# Patient Record
Sex: Female | Born: 1980 | Race: White | Hispanic: No | Marital: Single | Smoking: Former smoker
Health system: Southern US, Community
[De-identification: ages and names within clinical notes are randomized; demographics above are authoritative.]

## PROBLEM LIST (undated history)

## (undated) DIAGNOSIS — G629 Polyneuropathy, unspecified: Secondary | ICD-10-CM

## (undated) DIAGNOSIS — I10 Essential (primary) hypertension: Secondary | ICD-10-CM

## (undated) DIAGNOSIS — K3184 Gastroparesis: Secondary | ICD-10-CM

---
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
## 2001-04-02 ENCOUNTER — Encounter: Admission: RE | Admit: 2001-04-02 | Discharge: 2001-07-01 | Payer: Self-pay | Admitting: Family Medicine

## 2001-05-20 ENCOUNTER — Ambulatory Visit (HOSPITAL_COMMUNITY): Admission: RE | Admit: 2001-05-20 | Discharge: 2001-05-20 | Payer: Self-pay | Admitting: Obstetrics and Gynecology

## 2001-06-11 ENCOUNTER — Ambulatory Visit (HOSPITAL_COMMUNITY): Admission: RE | Admit: 2001-06-11 | Discharge: 2001-06-11 | Payer: Self-pay | Admitting: Obstetrics and Gynecology

## 2001-07-06 ENCOUNTER — Encounter: Admission: RE | Admit: 2001-07-06 | Discharge: 2001-10-04 | Payer: Self-pay | Admitting: Family Medicine

## 2001-08-31 ENCOUNTER — Emergency Department (HOSPITAL_COMMUNITY): Admission: EM | Admit: 2001-08-31 | Discharge: 2001-08-31 | Payer: Self-pay | Admitting: Emergency Medicine

## 2001-10-08 ENCOUNTER — Encounter: Admission: RE | Admit: 2001-10-08 | Discharge: 2002-01-06 | Payer: Self-pay

## 2001-11-16 ENCOUNTER — Other Ambulatory Visit: Admission: RE | Admit: 2001-11-16 | Discharge: 2001-11-16 | Payer: Self-pay | Admitting: Obstetrics and Gynecology

## 2002-04-19 ENCOUNTER — Ambulatory Visit (HOSPITAL_COMMUNITY): Admission: RE | Admit: 2002-04-19 | Discharge: 2002-04-19 | Payer: Self-pay | Admitting: Cardiology

## 2003-01-06 ENCOUNTER — Encounter: Admission: RE | Admit: 2003-01-06 | Discharge: 2003-01-06 | Payer: Self-pay | Admitting: Family Medicine

## 2003-01-26 ENCOUNTER — Encounter: Admission: RE | Admit: 2003-01-26 | Discharge: 2003-01-26 | Payer: Self-pay | Admitting: Family Medicine

## 2003-01-27 ENCOUNTER — Encounter: Admission: RE | Admit: 2003-01-27 | Discharge: 2003-01-27 | Payer: Self-pay | Admitting: Family Medicine

## 2003-02-04 ENCOUNTER — Encounter: Admission: RE | Admit: 2003-02-04 | Discharge: 2003-02-04 | Payer: Self-pay | Admitting: Family Medicine

## 2003-10-07 ENCOUNTER — Other Ambulatory Visit: Admission: RE | Admit: 2003-10-07 | Discharge: 2003-10-07 | Payer: Self-pay | Admitting: Obstetrics and Gynecology

## 2004-10-25 ENCOUNTER — Other Ambulatory Visit: Admission: RE | Admit: 2004-10-25 | Discharge: 2004-10-25 | Payer: Self-pay | Admitting: Obstetrics and Gynecology

## 2004-12-26 ENCOUNTER — Encounter: Admission: RE | Admit: 2004-12-26 | Discharge: 2004-12-26 | Payer: Self-pay | Admitting: Internal Medicine

## 2005-11-17 ENCOUNTER — Encounter: Admission: RE | Admit: 2005-11-17 | Discharge: 2005-11-17 | Payer: Self-pay | Admitting: Internal Medicine

## 2005-12-10 ENCOUNTER — Other Ambulatory Visit: Admission: RE | Admit: 2005-12-10 | Discharge: 2005-12-10 | Payer: Self-pay | Admitting: Obstetrics and Gynecology

## 2005-12-14 ENCOUNTER — Encounter: Admission: RE | Admit: 2005-12-14 | Discharge: 2005-12-14 | Payer: Self-pay | Admitting: Internal Medicine

## 2006-05-05 ENCOUNTER — Emergency Department (HOSPITAL_COMMUNITY): Admission: EM | Admit: 2006-05-05 | Discharge: 2006-05-05 | Payer: Self-pay | Admitting: Emergency Medicine

## 2006-05-10 ENCOUNTER — Encounter: Admission: RE | Admit: 2006-05-10 | Discharge: 2006-05-10 | Payer: Self-pay | Admitting: Internal Medicine

## 2006-05-18 ENCOUNTER — Encounter: Admission: RE | Admit: 2006-05-18 | Discharge: 2006-05-18 | Payer: Self-pay | Admitting: Anesthesiology

## 2006-05-25 ENCOUNTER — Encounter: Admission: RE | Admit: 2006-05-25 | Discharge: 2006-05-25 | Payer: Self-pay | Admitting: Anesthesiology

## 2006-06-19 DIAGNOSIS — E109 Type 1 diabetes mellitus without complications: Secondary | ICD-10-CM | POA: Insufficient documentation

## 2006-06-19 DIAGNOSIS — G609 Hereditary and idiopathic neuropathy, unspecified: Secondary | ICD-10-CM | POA: Insufficient documentation

## 2006-06-19 DIAGNOSIS — R112 Nausea with vomiting, unspecified: Secondary | ICD-10-CM | POA: Insufficient documentation

## 2006-06-19 DIAGNOSIS — E1065 Type 1 diabetes mellitus with hyperglycemia: Secondary | ICD-10-CM | POA: Insufficient documentation

## 2006-06-19 DIAGNOSIS — L708 Other acne: Secondary | ICD-10-CM

## 2008-08-06 ENCOUNTER — Emergency Department (HOSPITAL_BASED_OUTPATIENT_CLINIC_OR_DEPARTMENT_OTHER): Admission: EM | Admit: 2008-08-06 | Discharge: 2008-08-06 | Payer: Self-pay | Admitting: Emergency Medicine

## 2008-09-22 ENCOUNTER — Encounter: Admission: RE | Admit: 2008-09-22 | Discharge: 2008-09-22 | Payer: Self-pay | Admitting: Unknown Physician Specialty

## 2010-05-12 ENCOUNTER — Encounter: Payer: Self-pay | Admitting: Internal Medicine

## 2010-05-14 ENCOUNTER — Encounter: Payer: Self-pay | Admitting: Unknown Physician Specialty

## 2010-08-01 LAB — URINALYSIS, ROUTINE W REFLEX MICROSCOPIC
Nitrite: NEGATIVE
Protein, ur: 30 mg/dL — AB
pH: 7 (ref 5.0–8.0)

## 2010-08-01 LAB — DIFFERENTIAL
Eosinophils Absolute: 0.1 10*3/uL (ref 0.0–0.7)
Eosinophils Relative: 2 % (ref 0–5)
Lymphocytes Relative: 31 % (ref 12–46)
Lymphs Abs: 1.8 10*3/uL (ref 0.7–4.0)
Monocytes Relative: 5 % (ref 3–12)
Neutro Abs: 3.8 10*3/uL (ref 1.7–7.7)
Neutrophils Relative %: 62 % (ref 43–77)

## 2010-08-01 LAB — URINE MICROSCOPIC-ADD ON

## 2010-08-01 LAB — GLUCOSE, CAPILLARY: Glucose-Capillary: 256 mg/dL — ABNORMAL HIGH (ref 70–99)

## 2010-08-01 LAB — CBC
HCT: 48.8 % — ABNORMAL HIGH (ref 36.0–46.0)
Hemoglobin: 16 g/dL — ABNORMAL HIGH (ref 12.0–15.0)
MCV: 90.4 fL (ref 78.0–100.0)
Platelets: 304 10*3/uL (ref 150–400)
RBC: 5.4 MIL/uL — ABNORMAL HIGH (ref 3.87–5.11)
RDW: 13.3 % (ref 11.5–15.5)

## 2010-08-01 LAB — URINE CULTURE: Colony Count: >=100000

## 2010-08-01 LAB — BASIC METABOLIC PANEL
CO2: 31 mEq/L (ref 19–32)
Chloride: 91 mEq/L — ABNORMAL LOW (ref 96–112)
GFR calc Af Amer: >60 mL/min (ref >60)
GFR calc non Af Amer: >60 mL/min (ref >60)
Glucose, Bld: 241 mg/dL — ABNORMAL HIGH (ref 70–99)

## 2010-09-07 NOTE — H&P (Signed)
[facility]  Patient:    Anna, Garcia Visit Number: [id number] MRN: [id number]          Service Type: OUT Location: DAY Attending Physician:  Oliver Pila Dictated by:   Alvino Chapel, M.D. Admit Date:  05/20/2001 Discharge Date: 05/20/2001                           History and Physical  HISTORY OF PRESENT ILLNESS:  The patient is a 30 year old G1, P0, who complains of increasing and constant pelvic pain for greater than a year. The patient reports that this pain is present daily.  However, appears to be much worse with intercourse.  This pain is present daily and it is not necessarily related to her menstrual cycle.  She reports normal bowel movements and had been on oral contraceptives for over one year with no real significant improvement in her symptoms.  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  PAST SURGICAL HISTORY:  Suction D&C.  PAST GYN HISTORY:  None.  PAST OBSTETRICAL HISTORY:  She had an elective abortion in 1999.  PAST FAMILY HISTORY:  No history of breast cancer.  PHYSICAL EXAMINATION:  VITAL SIGNS:  The patients height is 5 feet and 3 inches.  Weight is 143 pounds.  Blood pressure 118/60.  BREASTS:  Exam is normal.  ABDOMEN:  Soft and nontender.  CARDIAC EXAMINATION:  Regular rate and rhythm.  LUNGS:  Clear.  PELVIC EXAMINATION:  Showed a small anteverted uterus.  The adnexa have no significant masses.  There is some tenderness directly over the uterine fundus.  CURRENT MEDICATIONS:  Insulin, _______, Neurontin, and Zyprexa.  LABORATORY DATA:  She has had extensive workup for possible bowel etiology of this pain and has even seen a neurologist for related symptoms of possible fibromyalgia.  An ultrasound performed in April 2002 was essentially within normal limits, and the patient had multiple tests performed for STDs with no findings as well as  long-term trial of birth control pills with no improvement in her pain.  ASSESSMENT:  The patient was counseled as to the risks and benefits of proceeding with laparoscopy to rule out endometriosis or other pathology. These risks were described as bleeding, infection, and possible damage to bowel and bladder.  The patient understands these risks and strongly desires to proceed with the surgery for possible etiology of her pain.  The patient was originally scheduled for surgery on May 25, 2001.  However, when she went for preoperative testing, her glucose was very elevated at 446.  For that reason, her surgery was postponed until her diabetes could be better controlled.  She was then seen by her primary care doctor, Dr. Smith Mince and hemoglobin A1C at that time was 7.7, and her insulin was adjusted with much improvement and her preoperative glucose was 89 on recent testing.  The patient did have an anesthesia consult prior to surgery regarding her glucose management. Dictated by:   Alvino Chapel, M.D. Attending Physician:  Oliver Pila DD:  06/10/01 TD:  06/10/01 Job: [id number]

## 2010-09-07 NOTE — Procedures (Signed)
[place]  Patient:    Anna Garcia, Anna Garcia                         MRN: [id number] Proc. Date: 10/31/00 Adm. Date:  [id number] Disc. Date: [id number] Attending:  Charna Elizabeth CC:         Talmadge Coventry, M.D.   Procedure Report  DATE OF BIRTH:  [birth year]  PROCEDURE:  Esophagogastroduodenoscopy.  ENDOSCOPIST:  Anselmo Rod, M.D.  INSTRUMENT USED:  Olympus video upper panendoscope.  INDICATION FOR PROCEDURE:  Nausea, vomiting, and epigastric pain in a _____ white female.  Rule out peptic ulcer disease, esophagitis, gastritis, etc. The patient has a long-standing history of insulin-dependent diabetes.  PREPROCEDURE PREPARATION:  Informed consent was procured from the patient. The patient was fasted for eight hours prior to the procedure.  PREPROCEDURE PHYSICAL:  VITAL SIGNS:  The patient had stable vital signs.  NECK:  Supple.  CHEST:  Clear to auscultation.  S1, S2 regular.  ABDOMEN:  Soft with normal bowel sounds, epigastric tenderness on palpation with guarding.  No rebound or rigidity.  DESCRIPTION OF PROCEDURE:  The patient was placed in the left lateral decubitus position and sedated with 70 mg of Demerol and 7 mg of Versed intravenously.  Once the patient was adequately sedate and maintained on low-flow oxygen and continuous cardiac monitoring, the Olympus video panendoscope was advanced through the mouthpiece, over the tongue, into the esophagus under direct vision.  The entire esophagus appeared normal without evidence of ring, stricture, masses, lesions, esophagitis, or Barretts mucosa.  The scope was then advanced into the stomach.  There was some small amount of debris along the greater curvature.  No other abnormalities were seen.  The proximal small bowel appeared normal.  IMPRESSION:  Some debris in the stomach, otherwise normal EGD.  RECOMMENDATIONS:  Will try prokinetics empirically and follow up on  an outpatient basis. DD:  12/07/00 TD:  12/08/00 Job: [id number]

## 2010-09-07 NOTE — Consult Note (Signed)
[facility]  Patient:    [name], [name] Visit Number: [id number] MRN: [id number]          Service Type: PMG Location: TPC Attending Physician:  Anna Garcia Dictated by:   Anna Garcia, D.O. Proc. Date: 10/09/01 Admit Date:  10/08/2001   CC:         Anna Garcia, M.D.  Anna Garcia, M.D.   Consultation Report  NEW PATIENT CONSULTATION  REFERRING Anna Garcia:  Anna Garcia, M.D.  Dear Dr. Lucianne Garcia:  Thank you very much for kindly referring [facility] to [facility].  The patient was seen in our clinic today.  Please refer to the following for details regarding the history, physical examination, and treatment recommendations.  Once again, thank you for allowing [place] to participate in the care of Anna Garcia.  CHIEF COMPLAINT:  Extreme pain and weakness.  HISTORY OF PRESENT ILLNESS:  Anna Garcia is a pleasant 30 year old right-hand dominant female who was interviewed and examined in a chaperoned environment. The patient states she is a poorly controlled diabetic who was diagnosed with diabetes mellitus in 2000 when she was hospitalized for diabetic ketoacidosis.  She states that her diabetes is poorly controlled with blood sugars ranging from less than 20 to greater than 500.  She states she was diagnosed with diabetic neuropathy.  She complains of "peripheral neuropathy everywhere." Her pain is migratory and denies any one particular spot in her body which is consistently worse than the others.  She has had a gradual increase in her pain over the last two years.  She states she has been evaluated by three neurologists in the past but cannot remember who they were.  She also has had a rheumatology evaluation but cannot remember the physicians name.  She has had nerve conduction studies in the past but vaguely remembers if they were normal.  She also complains of occasional headaches and dizzy  spells, and states that she has a followup with another neurologist.  She also states that she was supposed to undergo some type of exploratory laparoscopy for "pain in my ovaries and cervix;" however, her blood sugars were too unstable to undergo the surgery.  Somewhere along the way she has tried Duragesic patches and OxyContin, neither of which she was able to tolerate secondary to nausea, vomiting and dizziness.  She complains of significant gastroparesis, which is currently being worked up.  She states she cannot barely keep anything down and has a difficult time with most medications.  The only medication she is taking for pain right now is Neurontin 900 mg a day, which she takes at bedtime.  She states that it does not help her pain much but helps her with "twitches."  She denies a history of depression, although she does have scars on her wrists bilaterally and when questioned she states that she had "issues" when she was younger.  She states she was approximately 30 years old when she cut her wrists and forearm.  When asked why she did this, she was somewhat elusive and would not respond.  I asked her if she had any siblings and she responded that she had an older brother.  I asked if they got along well at which time she began to become tearful and stated that they do not talk much. She did not discuss these issues any further.  In terms of other medications that she has tried include muscle relaxers and  anti-inflammatories, but she cannot remember the names of any.  As I list various medications, she states that she tried it including Zanaflex, Flexeril, Celebrex, Bextra, Vioxx, and stated that "nothing helps."  I reviewed the health and history form, and 14-point review of systems.  The patients pain level is a 10/10 on a subjective scale involving essentially every aspect of her body today.  She describes it as constant, dull, achy, throbbing, sharp, burning, stabbing  with associated numbness, tingling and weakness.  Symptoms are worse with walking, bending, sitting, working, and without any alleviating factors.  Function and quality of life indices have declined, sleep is poor.  She admits to a 60 pound weight loss in the past six months.  She also admits to poor bladder emptying, which has been worked up with urodynamic studies.  She complains of double vision, blurred vision, cataracts, dizziness, sinus trouble, sore tongue, chest pain, irregular heartbeat, leg pain with walking, wheezing, shortness of breath, asthma, heartburn, change in bowel habits, nausea, vomiting, headaches, and memory loss.  She denies risk to harm herself or others.  PAST MEDICAL HISTORY: 1. Diabetic neuropathy by history. 2. 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 3. Gastroparesis. 4. History of depression, although denies at this time. 5. Cataracts.  PAST SURGICAL HISTORY:  Denies.  Hospital history includes a hospitalization for ketoacidosis in 2000.  FAMILY HISTORY:  Heart disease and hypertension.  SOCIAL HISTORY:  The patient smokes one half pack of cigarettes per day or less.  She admits to occasional alcohol use.  She admits to smoking marijuana on occasion.  She is single and is not currently working since 2001.  She denies any history of abuse but again has some previous "issues."  PHYSICAL EXAMINATION:  GENERAL:  The patient is a thin female who moves somewhat slowly.  The patient has significant difficulty standing up from a seated position with legs shaking and slowly moving.  She stands upright.  VITAL SIGNS:  Blood pressure 114/60, pulse 86, respirations 18, O2 saturation is 99% on room air.  SPINE:  Examination reveals level pelvis without scoliosis.  There is normal lumbar lordosis, thoracic kyphosis and cervical lordosis.  Range of motion of the spine is limited secondary to guarding.   MUSCULOSKELETAL:  Palpatory examination  is diffusely tender to palpation over the periscapular muscles, paraspinal muscles cervical, thoracic and lumbar, upper and lower extremities diffusely.  NEUROLOGIC:  Manual muscle testing reveals significant giveway weakness with cogwheeling and shaking of the extremities in all four extremities.  Sensory exam is difficult to determine but the patient states diffusely she either has tingly sensation or numbness but is not very specific.  Muscle stretch reflexes are 2+/4 bilateral biceps, triceps, brachioradialis, pronator teres, patellar, medial hamstrings, and Achilles.  EXTREMITIES:  There is no distal atrophy noted in the upper and lower extremities.  No heat, erythema, or edema in the upper and lower extremities. Straight leg raise is negative bilaterally.  FABER is negative bilaterally. Lhermittes is negative bilaterally.  There are noted small scars at the left volar wrist as well as the left volar forearm.  IMPRESSION: 1. Diffuse soft tissue pain syndrome.  Underlying etiology uncertain.  I    suspect there is likely a significant psychological overlay with possible    conversion.  The patients history is suggestive of underlying    psychological component. 2. Depressive disorder. 3. Poorly controlled diabetes mellitus with questionable peripheral    neuropathy. 4. Gastroparesis, question whether or not  there is an eating disorder.  RECOMMENDATIONS: 1. Recommend psychiatric/psychologic evaluation. 2. Will attempt to gather records from the patients Primary Care Ticia Virgo,    Dr. Smith Mince regarding previous workup. 3. The patient is to followup with Dr. Lucianne Garcia. 4. Would consider increasing Neurontin dose if suspicious for peripheral    polyneuropathy. 5. I do not believe the patient is a candidate for any type of opioid therapy    nor would I prescribe any with the patients history of marijuana use. 6. I strongly feel that she needs to have underlying psychiatric issues     addressed.  The patient was educated on the above findings and recommendations, and understands.  There were no barriers to communication.  Again, the patient was interviewed and examined in a chaperoned environment. Dictated by:   Anna Garcia, D.O. Attending Physician:  Anna Garcia DD:  10/09/01 TD:  10/11/01 Job: [id number]

## 2010-09-07 NOTE — Op Note (Signed)
NAME:  Anna Garcia, Anna Garcia                            ACCOUNT NO.:  [id number]   MEDICAL RECORD NO.:  [id number]                   PATIENT TYPE:  OIB   LOCATION:  2899                                 FACILITY:  [facility]   PHYSICIAN:  Doylene Canning. Ladona Ridgel, M.D. [facility]           DATE OF BIRTH:  27-Sep-1980   DATE OF PROCEDURE:  DATE OF DISCHARGE:                                 OPERATIVE REPORT   Ms. Rossetti is referred by Dr. Juanito Doom for evaluation of recurrent episodes of  near-syncope and light-headedness, refractory to initial medical therapy for  head of tilt table testing.   1. The patient is a 30 year old young lady with a history of diabetes and     diabetic neuropathy.  She is followed by Dr. Lucianne Muss.  Because of     palpitations and recurrent episodes of dizziness and light-headedness     which have not initially responded to salt and an increase in fluid, the     patient is referred for head of tilt table testing.  2. PROCEDURE:  After informed consent was obtained, the patient was taken to     the diagnostic EP lab in the fasting state.  After usual preparations,     she was placed in the supine position.  Her initial blood pressure was     110/67 with a pulse of 78.  She was allowed to remain in this position     for five minutes, and her blood pressure and heart rate remained stable.     She was placed in the head up tilt position.  Initially, at the first     minute into tilting, her blood pressure was 101/63 with a pulse of 93.     The blood pressure remained in the 95-110 range systolic with heart rates     in the 90-100 range over the course of the next 30 minutes.  The patient     had minimal symptoms, she had very mild light-headedness.  After 30     minutes in this position, she was placed back in the supine position.  As     noted, her blood pressure remained stable as did her heart rate.  She was     placed back in the supine position and isoproterenol was subsequently     infused.   Once the heart rate had increased, she was placed back in the     head up tilt table position.  Her heart rate increased from 115 up to     140.  Her blood pressure remained fairly stable, dipping as low at 87/60     systolic.  During this period of time, the patient had very mild chest     pressure and headache, and mild nausea but no frank syncope.  After ten     minutes, her heart rate remained in the 110-140 range, blood pressure  remained in the 85-110 range and she was placed back in the supine     position and isoproterenol was discontinued.  She was returned to her     room in satisfactory condition.  3.     COMPLICATIONS:  There were no immediate procedure complications.  4. RESULTS:  This study has failed to demonstrate evidence of neurally     mediated syncope.  In addition, there was no evidence of venous pooling     during head up tilting.                                                Doylene Canning. Ladona Ridgel, M.D. [facility]    GWT/MEDQ  D:  04/19/2002  T:  04/19/2002  Job:  [id number]   cc:   Reather Littler, M.D.  [address]  [place]  [place] [address]  Fax: [phone]   Jesse Sans. Wall, M.D. [facility]  [address]  [place]  [place] [address]  Fax: [phone]

## 2011-04-11 ENCOUNTER — Emergency Department (INDEPENDENT_AMBULATORY_CARE_PROVIDER_SITE_OTHER): Payer: 59

## 2011-04-11 ENCOUNTER — Encounter: Payer: Self-pay | Admitting: *Deleted

## 2011-04-11 ENCOUNTER — Other Ambulatory Visit: Payer: Self-pay

## 2011-04-11 ENCOUNTER — Emergency Department (HOSPITAL_BASED_OUTPATIENT_CLINIC_OR_DEPARTMENT_OTHER)
Admission: EM | Admit: 2011-04-11 | Discharge: 2011-04-11 | Disposition: A | Discharge status: Home / Self Care | Payer: 59 | Attending: Emergency Medicine | Admitting: Emergency Medicine

## 2011-04-11 DIAGNOSIS — E1010 Type 1 diabetes mellitus with ketoacidosis without coma: Secondary | ICD-10-CM | POA: Insufficient documentation

## 2011-04-11 DIAGNOSIS — I10 Essential (primary) hypertension: Secondary | ICD-10-CM | POA: Insufficient documentation

## 2011-04-11 DIAGNOSIS — R000 Tachycardia, unspecified: Secondary | ICD-10-CM

## 2011-04-11 DIAGNOSIS — F17200 Nicotine dependence, unspecified, uncomplicated: Secondary | ICD-10-CM | POA: Insufficient documentation

## 2011-04-11 DIAGNOSIS — R0602 Shortness of breath: Secondary | ICD-10-CM

## 2011-04-11 DIAGNOSIS — E1110 Type 2 diabetes mellitus with ketoacidosis without coma: Secondary | ICD-10-CM

## 2011-04-11 HISTORY — DX: Polyneuropathy, unspecified: G62.9

## 2011-04-11 HISTORY — DX: Essential (primary) hypertension: I10

## 2011-04-11 LAB — BASIC METABOLIC PANEL
BUN: 23 mg/dL (ref 6–23)
Chloride: 93 mEq/L — ABNORMAL LOW (ref 96–112)
Creatinine, Ser: 0.9 mg/dL (ref 0.50–1.10)
GFR calc Af Amer: >90 mL/min (ref >90)
GFR calc non Af Amer: 86 mL/min — ABNORMAL LOW (ref >90)
Glucose, Bld: 522 mg/dL — ABNORMAL HIGH (ref 70–99)
Potassium: 4.8 mEq/L (ref 3.5–5.1)
Sodium: 132 mEq/L — ABNORMAL LOW (ref 135–145)

## 2011-04-11 LAB — URINE MICROSCOPIC-ADD ON

## 2011-04-11 LAB — CBC
Platelets: 222 10*3/uL (ref 150–400)
WBC: 12.5 10*3/uL — ABNORMAL HIGH (ref 4.0–10.5)

## 2011-04-11 LAB — DIFFERENTIAL
Basophils Absolute: 0 10*3/uL (ref 0.0–0.1)
Basophils Relative: 0 % (ref 0–1)
Lymphocytes Relative: 19 % (ref 12–46)
Monocytes Absolute: 0.9 10*3/uL (ref 0.1–1.0)
Neutro Abs: 9 10*3/uL — ABNORMAL HIGH (ref 1.7–7.7)

## 2011-04-11 LAB — POCT I-STAT 3, ART BLOOD GAS (G3+)
Bicarbonate: 8.2 mEq/L — ABNORMAL LOW (ref 20.0–24.0)
Patient temperature: 97.7
pH, Arterial: 7.289 — ABNORMAL LOW (ref 7.350–7.400)

## 2011-04-11 LAB — URINALYSIS, ROUTINE W REFLEX MICROSCOPIC
Bilirubin Urine: NEGATIVE
Nitrite: NEGATIVE
Specific Gravity, Urine: 1.029 (ref 1.005–1.030)
pH: 5 (ref 5.0–8.0)

## 2011-04-11 LAB — GLUCOSE, CAPILLARY: Glucose-Capillary: 323 mg/dL — ABNORMAL HIGH (ref 70–99)

## 2011-04-11 LAB — PREGNANCY, URINE: Preg Test, Ur: NEGATIVE

## 2011-04-11 MED ORDER — IOHEXOL 350 MG/ML SOLN
80.0000 mL | Freq: Once | INTRAVENOUS | Status: AC | PRN
  Administered 2011-04-11: 80 mL via INTRAVENOUS

## 2011-04-11 MED ORDER — INSULIN REGULAR HUMAN 100 UNIT/ML IJ SOLN
10.0000 [IU] | Freq: Once | INTRAMUSCULAR | Status: AC
  Administered 2011-04-11: 10 [IU] via INTRAVENOUS
  Filled 2011-04-11: qty 3

## 2011-04-11 MED ORDER — DEXTROSE-NACL 5-0.45 % IV SOLN: INTRAVENOUS | Status: DC

## 2011-04-11 MED ORDER — MORPHINE SULFATE 4 MG/ML IJ SOLN
4.0000 mg | Freq: Once | INTRAMUSCULAR | Status: AC
  Administered 2011-04-11: 4 mg via INTRAVENOUS
  Filled 2011-04-11: qty 1

## 2011-04-11 MED ORDER — SODIUM CHLORIDE 0.9 % IV SOLN
INTRAVENOUS | Status: DC
  Administered 2011-04-11: 16:00:00 via INTRAVENOUS

## 2011-04-11 MED ORDER — SODIUM CHLORIDE 0.9 % IV BOLUS (SEPSIS)
1000.0000 mL | Freq: Once | INTRAVENOUS | Status: AC
  Administered 2011-04-11: 1000 mL via INTRAVENOUS

## 2011-04-11 MED ORDER — INSULIN REGULAR BOLUS VIA INFUSION
0.0000 [IU] | Freq: Three times a day (TID) | INTRAVENOUS | Status: DC
  Filled 2011-04-11: qty 10

## 2011-04-11 MED ORDER — GI COCKTAIL ~~LOC~~
ORAL | Status: AC
  Administered 2011-04-11: 60 mL
  Filled 2011-04-11: qty 30

## 2011-04-11 MED ORDER — DEXTROSE 50 % IV SOLN: 25.0000 mL | INTRAVENOUS | Status: DC | PRN

## 2011-04-11 MED ORDER — METOCLOPRAMIDE HCL 5 MG/ML IJ SOLN
10.0000 mg | Freq: Once | INTRAMUSCULAR | Status: AC
  Administered 2011-04-11: 10 mg via INTRAVENOUS
  Filled 2011-04-11: qty 2

## 2011-04-11 MED ORDER — SODIUM CHLORIDE 0.9 % IV SOLN
INTRAVENOUS | Status: DC
  Administered 2011-04-11: 16:00:00 via INTRAVENOUS
  Filled 2011-04-11: qty 3

## 2011-04-11 NOTE — ED Notes (Signed)
Glucose stabilizer started. Regular Insulin drip 100cc NS with Regular Insulin 100units started at 3.1cc/hr

## 2011-04-11 NOTE — ED Notes (Signed)
accu check 371

## 2011-04-11 NOTE — ED Notes (Signed)
Hyperglycemic x 3 days. Hx of on going unstable glucose levels that her MD is aware of per pt.  Diarrhea and vomiting x 3 days.

## 2011-04-11 NOTE — ED Notes (Signed)
Pt has been debating on whether she is going to stay and be admitted or go home. She spoke with Teressa Lower NP and was encouraged to stay. Room # was given to pt and pts mother states she still does not know if she is going to stay or go home and see her MD tomorrow. She feels so much better per family. Discouraged on idea of going home.

## 2011-04-11 NOTE — ED Provider Notes (Signed)
History/physical exam/procedure(s) were performed by non-physician practitioner and as supervising physician I was immediately available for consultation/collaboration. I have reviewed all notes and am in agreement with care and plan. I have seen and evaluated patient.  She remains tachypneic and tachycardiac.  She will continue with insulin drip and iv fluids.    Hilario Quarry, MD 04/11/11 [phone]

## 2011-04-11 NOTE — ED Notes (Signed)
cbg 323. Insulin drip increased to 5.3cc/hr per glucose stabilizer.

## 2011-04-11 NOTE — ED Provider Notes (Signed)
History     CSN: [id number]  Arrival date & time 04/11/11  1329   First MD Initiated Contact with Patient 04/11/11 1339      Chief Complaint  Patient presents with  . Hyperglycemia    (Consider location/radiation/quality/duration/timing/severity/associated sxs/prior treatment) HPI Comments: Pt states that she has been having vomiting and diarrhea over the last 3 days:pt states that she has gastroparesis, but the diarrhea is different then normal:pt states that she usually has to be hospitalized 3-4 times a year:pt states that her baseline sugar runs about 300:pt states that she has also had sob for the last couple of day  Patient is a 30 y.o. female presenting with diabetes problem. The history is provided by the patient.  Diabetes She has type 1 diabetes mellitus. Symptoms are worsening. Symptoms have been present for 3 days. Her home blood glucose trend is increasing rapidly.    Past Medical History  Diagnosis Date  . Diabetes mellitus   . Hypertension   . Neuropathy     History reviewed. No pertinent past surgical history.  No family history on file.  History  Substance Use Topics  . Smoking status: Current Everyday Smoker -- 1.0 packs/day  . Smokeless tobacco: Not on file  . Alcohol Use: No    OB History    Grav Para Term Preterm Abortions TAB SAB Ect Mult Living                  Review of Systems  All other systems reviewed and are negative.    Allergies  Review of patient's allergies indicates no known allergies.  Home Medications   Current Outpatient Rx  Name Route Sig Dispense Refill  . NEXIUM PO Oral Take by mouth.      . INSULIN ASPART 100 UNIT/ML [place] SOLN Subcutaneous Inject into the skin 3 (three) times daily before meals.      . INSULIN GLARGINE 100 UNIT/ML [place] SOLN Subcutaneous Inject 16 Units into the skin 2 (two) times daily.      Marland Kitchen LISINOPRIL PO Oral Take by mouth.      . NUCYNTA PO Oral Take by mouth.        BP 97/49  Pulse 118   Temp(Src) 97.7 F (36.5 C) (Oral)  Resp 20  Ht 5\' 4"  (1.626 m)  Wt 110 lb (49.896 kg)  BMI 18.88 kg/m2  SpO2 100%  Physical Exam  Nursing note and vitals reviewed. Constitutional: She is oriented to person, place, and time. She appears well-developed and well-nourished.  HENT:  Head: Normocephalic and atraumatic.  Cardiovascular: Normal rate and regular rhythm.   Pulmonary/Chest: Effort normal and breath sounds normal.  Abdominal: Soft. Bowel sounds are normal.  Musculoskeletal: Normal range of motion.  Neurological: She is alert and oriented to person, place, and time.  Skin: There is pallor.  Psychiatric: She has a normal mood and affect.    ED Course  Procedures (including critical care time)  Labs Reviewed  CBC - Abnormal; Notable for the following:    WBC 12.5 (*)    All other components within normal limits  DIFFERENTIAL - Abnormal; Notable for the following:    Neutro Abs 9.0 (*)    All other components within normal limits  BASIC METABOLIC PANEL - Abnormal; Notable for the following:    Sodium 132 (*)    Chloride 93 (*)    CO2 11 (*)    Glucose, Bld 522 (*)    GFR calc non Af [place] Dago  86 (*)    All other components within normal limits  URINALYSIS, ROUTINE W REFLEX MICROSCOPIC - Abnormal; Notable for the following:    Glucose, UA >1000 (*)    Ketones, ur >80 (*)    All other components within normal limits  D-DIMER, QUANTITATIVE - Abnormal; Notable for the following:    D-Dimer, Quant 0.67 (*)    All other components within normal limits  POCT I-STAT 3, BLOOD GAS (G3+) - Abnormal; Notable for the following:    pH, Arterial 7.289 (*)    pCO2 arterial 17.0 (*)    Bicarbonate 8.2 (*)    Acid-base deficit 16.0 (*)    All other components within normal limits  GLUCOSE, CAPILLARY - Abnormal; Notable for the following:    Glucose-Capillary 371 (*)    All other components within normal limits  PREGNANCY, URINE  URINE MICROSCOPIC-ADD ON  POCT CBG MONITORING    I-STAT 3, BLOOD GAS (G3+)  POCT CBG MONITORING  POCT CBG MONITORING   Ct Angio Chest W/cm &/or Wo Cm  04/11/2011  *RADIOLOGY REPORT*  Clinical Data:  Shortness of breath and tachycardia  CT ANGIOGRAPHY CHEST WITH CONTRAST  Technique:  Multidetector CT imaging of the chest was performed using the standard protocol during bolus administration of intravenous contrast.  Multiplanar CT image reconstructions including MIPs were obtained to evaluate the vascular anatomy.  Contrast: 80mL OMNIPAQUE IOHEXOL 350 MG/ML IV SOLN  Comparison:  None  Findings:  No enlarged supraclavicular or axillary lymph nodes.  No mediastinal or hilar lymph nodes identified.  No pericardial or pleural effusion identified.  There is no abnormal filling defect within the main pulmonary artery or its branches to suggest an acute pulmonary embolus.  The lungs appear clear.  No airspace consolidation identified.  No pulmonary nodule or mass identified.  The visualized osseous structures are unremarkable.  Review of the MIP images confirms the above findings.  IMPRESSION:  1.  No acute cardiopulmonary abnormalities. 2.  No evidence for pulmonary embolus.  Original Report Authenticated By: Rosealee Albee, M.D.     1. DKA (diabetic ketoacidoses)   2. SOB (shortness of breath)       MDM  No sign of pe:pt has had 3 liters of fluid and pt is going [facility] for D[facility]:pt decided to leave WUJ:[id number] risk with pt and mother and pt continues to wish to go home        Teressa Lower, NP 04/11/11 1613  Teressa Lower, NP 04/11/11 1736

## 2011-04-13 NOTE — ED Provider Notes (Signed)
Please see my note  Hilario Quarry, MD 04/13/11 1009

## 2014-04-08 ENCOUNTER — Emergency Department (HOSPITAL_BASED_OUTPATIENT_CLINIC_OR_DEPARTMENT_OTHER): Payer: Medicare Other

## 2014-04-08 ENCOUNTER — Emergency Department (HOSPITAL_BASED_OUTPATIENT_CLINIC_OR_DEPARTMENT_OTHER)
Admission: EM | Admit: 2014-04-08 | Discharge: 2014-04-08 | Disposition: A | Discharge status: Home / Self Care | Payer: Medicare Other | Attending: Emergency Medicine | Admitting: Emergency Medicine

## 2014-04-08 ENCOUNTER — Encounter (HOSPITAL_BASED_OUTPATIENT_CLINIC_OR_DEPARTMENT_OTHER): Payer: Self-pay

## 2014-04-08 DIAGNOSIS — K122 Cellulitis and abscess of mouth: Secondary | ICD-10-CM | POA: Diagnosis not present

## 2014-04-08 DIAGNOSIS — R220 Localized swelling, mass and lump, head: Secondary | ICD-10-CM

## 2014-04-08 DIAGNOSIS — Z794 Long term (current) use of insulin: Secondary | ICD-10-CM | POA: Insufficient documentation

## 2014-04-08 DIAGNOSIS — I10 Essential (primary) hypertension: Secondary | ICD-10-CM | POA: Insufficient documentation

## 2014-04-08 DIAGNOSIS — Z79899 Other long term (current) drug therapy: Secondary | ICD-10-CM | POA: Diagnosis not present

## 2014-04-08 DIAGNOSIS — K115 Sialolithiasis: Secondary | ICD-10-CM | POA: Insufficient documentation

## 2014-04-08 DIAGNOSIS — Z8669 Personal history of other diseases of the nervous system and sense organs: Secondary | ICD-10-CM | POA: Diagnosis not present

## 2014-04-08 DIAGNOSIS — Z791 Long term (current) use of non-steroidal anti-inflammatories (NSAID): Secondary | ICD-10-CM | POA: Diagnosis not present

## 2014-04-08 DIAGNOSIS — Z720 Tobacco use: Secondary | ICD-10-CM | POA: Diagnosis not present

## 2014-04-08 DIAGNOSIS — E119 Type 2 diabetes mellitus without complications: Secondary | ICD-10-CM | POA: Insufficient documentation

## 2014-04-08 LAB — CBC WITH DIFFERENTIAL/PLATELET
BASOS ABS: 0 10*3/uL (ref 0.0–0.1)
BASOS PCT: 0 % (ref 0–1)
EOS PCT: 3 % (ref 0–5)
Eosinophils Absolute: 0.3 10*3/uL (ref 0.0–0.7)
HEMATOCRIT: 37.2 % (ref 36.0–46.0)
Hemoglobin: 12.6 g/dL (ref 12.0–15.0)
LYMPHS ABS: 2.4 10*3/uL (ref 0.7–4.0)
LYMPHS PCT: 22 % (ref 12–46)
MCH: 31.7 pg (ref 26.0–34.0)
MCHC: 33.9 g/dL (ref 30.0–36.0)
MCV: 93.7 fL (ref 78.0–100.0)
MONO ABS: 1.1 10*3/uL — AB (ref 0.1–1.0)
Monocytes Relative: 10 % (ref 3–12)
Neutro Abs: 6.9 10*3/uL (ref 1.7–7.7)
Neutrophils Relative %: 65 % (ref 43–77)
Platelets: 177 10*3/uL (ref 150–400)
RBC: 3.97 MIL/uL (ref 3.87–5.11)
RDW: 12.3 % (ref 11.5–15.5)
WBC: 10.7 10*3/uL — AB (ref 4.0–10.5)

## 2014-04-08 LAB — BASIC METABOLIC PANEL
ANION GAP: 14 (ref 5–15)
BUN: 5 mg/dL — AB (ref 6–23)
CHLORIDE: 102 meq/L (ref 96–112)
CO2: 26 mEq/L (ref 19–32)
Calcium: 9.3 mg/dL (ref 8.4–10.5)
Creatinine, Ser: 0.8 mg/dL (ref 0.50–1.10)
Glucose, Bld: 236 mg/dL — ABNORMAL HIGH (ref 70–99)
POTASSIUM: 4.3 meq/L (ref 3.7–5.3)
SODIUM: 142 meq/L (ref 137–147)

## 2014-04-08 LAB — HCG, SERUM, QUALITATIVE: PREG SERUM: NEGATIVE

## 2014-04-08 MED ORDER — CLINDAMYCIN HCL 300 MG PO CAPS: 300.0000 mg | ORAL_CAPSULE | Freq: Four times a day (QID) | ORAL | Status: DC

## 2014-04-08 MED ORDER — CLINDAMYCIN PHOSPHATE 600 MG/50ML IV SOLN
600.0000 mg | Freq: Once | INTRAVENOUS | Status: AC
  Administered 2014-04-08: 600 mg via INTRAVENOUS
  Filled 2014-04-08: qty 50

## 2014-04-08 MED ORDER — IOHEXOL 300 MG/ML  SOLN
76.0000 mL | Freq: Once | INTRAMUSCULAR | Status: AC | PRN
  Administered 2014-04-08: 76 mL via INTRAVENOUS

## 2014-04-08 MED ORDER — IBUPROFEN 800 MG PO TABS
800.0000 mg | ORAL_TABLET | Freq: Once | ORAL | Status: AC
  Administered 2014-04-08: 800 mg via ORAL
  Filled 2014-04-08: qty 1

## 2014-04-08 NOTE — Discharge Instructions (Signed)
Clindamycin as prescribed.  Follow-up with ear nose and throat if not improving in the next 3 days. The contact information for Dr. Simeon Craft has been provided in this discharge summary. Return to the ER in the meantime if your symptoms substantially worsen or change.   Salivary Stone Your exam shows you have a stone in one of your saliva glands. These small stones form around a mucous plug in the ducts of the glands and cause the saliva in the gland to be blocked. This makes the gland swollen and painful, especially when you eat. If repeated episodes occur, the gland can become infected. Sometimes these stones can be seen on x-ray. Treatment includes stimulating the production of saliva to push the stone out. You should suck on a lemon or sour candies several times daily. Antibiotic medicine may be needed if the gland is infected. Increasing fluids, applying warm compresses to the swollen area 3-4 times daily, and massaging the gland from back to front may encourage drainage and passage of the stone. Surgical treatment to remove the stone is sometimes necessary, so proper medical follow up is very important. Call your doctor for an appointment as recommended. Call right away if you have a high fever, severe headache, vomiting, uncontrolled pain, or other serious symptoms. Document Released: 05/16/2004 Document Revised: 07/01/2011 Document Reviewed: 04/08/2005 [facility] Patient Information 2015 [place], [place]. This information is not intended to replace advice given to you by your health care provider. Make sure you discuss any questions you have with your health care provider.

## 2014-04-08 NOTE — ED Notes (Signed)
Patient transported to CT 

## 2014-04-08 NOTE — ED Notes (Signed)
Pt reports oral swelling and pain x 2 days associated with pain and purulent drainage under tongue.  States pain and swelling is worse in the morning.

## 2014-04-08 NOTE — ED Provider Notes (Signed)
CSN: [id number]     Arrival date & time 04/08/14  6440 History   First MD Initiated Contact with Patient 04/08/14 1006     Chief Complaint  Patient presents with  . Oral Swelling     (Consider location/radiation/quality/duration/timing/severity/associated sxs/prior Treatment) HPI Comments: Patient is a 33 year old female with history of type 1 diabetes and hypertension. She presents with complaints of swelling to the floor of her mouth and under her chin. This is worsened over the past 2 days and is now becoming more painful. She denies any fevers or chills. She denies any injury or trauma. She does state that she has a drainage from the floor of her mouth under her tongue.  She also tells me she had a biopsy of what was ultimately thought to be an inflamed salivary gland. This was done at [facility] approximately one year ago. She has been doing well since that time.  The history is provided by the patient.    Past Medical History  Diagnosis Date  . Diabetes mellitus   . Hypertension   . Neuropathy    History reviewed. No pertinent past surgical history. No family history on file. History  Substance Use Topics  . Smoking status: Current Every Day Smoker -- 1.00 packs/day  . Smokeless tobacco: Not on file  . Alcohol Use: No   OB History    No data available     Review of Systems  All other systems reviewed and are negative.     Allergies  Review of patient's allergies indicates no known allergies.  Home Medications   Prior to Admission medications   Medication Sig Start Date End Date Taking? Authorizing Provider  atorvastatin (LIPITOR) 20 MG tablet Take 20 mg by mouth daily.      Historical Provider, MD  esomeprazole (NEXIUM) 20 MG packet Take 20 mg by mouth 2 (two) times daily.      Historical Provider, MD  insulin aspart (NOVOLOG FLEXPEN) 100 UNIT/ML injection Inject 1-6 Units into the skin as needed. Up to 10 times a day according gastrophoresis     Historical  Provider, MD  insulin glargine (LANTUS SOLOSTAR) 100 UNIT/ML injection Inject 16 Units into the skin 2 (two) times daily.      Historical Provider, MD  lisinopril (PRINIVIL,ZESTRIL) 20 MG tablet Take 20 mg by mouth daily.      Historical Provider, MD  naproxen sodium (ANAPROX) 220 MG tablet Take 220 mg by mouth 2 (two) times daily as needed. For cramps      Historical Provider, MD  Tapentadol HCl (NUCYNTA ER) 150 MG TB12 Take 1 tablet by mouth 2 (two) times daily.      Historical Provider, MD  Tapentadol HCl 100 MG TABS Take 1 tablet by mouth every 4 (four) hours.      Historical Provider, MD   BP 126/83 mmHg  Pulse 117  Temp(Src) 98.8 F (37.1 C) (Oral)  Resp 18  Ht 5\' 3"  (1.6 m)  Wt 150 lb (68.04 kg)  BMI 26.58 kg/m2  SpO2 97%  LMP 03/18/2014 Physical Exam  Constitutional: She is oriented to person, place, and time. She appears well-developed and well-nourished. No distress.  HENT:  Head: Normocephalic and atraumatic.  There is swelling to the floor of the mouth. There are several lesions noted under the tongue. I'm able to express pus with palpation.  There is swelling and tenderness to the sub-mental soft tissues. There is no stridor.  Neck: Normal range of motion. Neck supple.  Cardiovascular: Normal rate, regular rhythm and normal heart sounds.   No murmur heard. Pulmonary/Chest: Effort normal and breath sounds normal. No respiratory distress. She has no wheezes.  Musculoskeletal: Normal range of motion. She exhibits no edema.  Neurological: She is alert and oriented to person, place, and time.  Skin: Skin is warm and dry. She is not diaphoretic.  Nursing note and vitals reviewed.   ED Course  Procedures (including critical care time) Labs Review Labs Reviewed  CBC WITH DIFFERENTIAL  BASIC METABOLIC PANEL    Imaging Review No results found.   EKG Interpretation None      MDM   Final diagnoses:  Mouth swelling    CT scan reveals a salivary duct stone with  enlargement of the salivary gland. There is no mention of abscess or other infectious appearing pathology on the imaging study. She will be treated with sialagogues and when necessary follow-up with ENT. She also has a lesion to the bottom of her mouth that appears infectious, however imaging does not reveal an abscess. This will be treated with clindamycin. She received an IV dose of this here in the ER.    Veryl Speak, MD 04/08/14 [phone]

## 2019-06-03 ENCOUNTER — Encounter (HOSPITAL_COMMUNITY): Payer: Self-pay | Admitting: Emergency Medicine

## 2019-06-03 ENCOUNTER — Emergency Department (HOSPITAL_COMMUNITY)
Admission: EM | Admit: 2019-06-03 | Discharge: 2019-06-03 | Disposition: A | Discharge status: Home / Self Care | Payer: Medicare Other | Attending: Emergency Medicine | Admitting: Emergency Medicine

## 2019-06-03 ENCOUNTER — Other Ambulatory Visit: Payer: Self-pay

## 2019-06-03 DIAGNOSIS — F17210 Nicotine dependence, cigarettes, uncomplicated: Secondary | ICD-10-CM | POA: Insufficient documentation

## 2019-06-03 DIAGNOSIS — K0889 Other specified disorders of teeth and supporting structures: Secondary | ICD-10-CM | POA: Diagnosis present

## 2019-06-03 DIAGNOSIS — I10 Essential (primary) hypertension: Secondary | ICD-10-CM | POA: Diagnosis not present

## 2019-06-03 DIAGNOSIS — Z79899 Other long term (current) drug therapy: Secondary | ICD-10-CM | POA: Diagnosis not present

## 2019-06-03 DIAGNOSIS — E119 Type 2 diabetes mellitus without complications: Secondary | ICD-10-CM | POA: Diagnosis not present

## 2019-06-03 DIAGNOSIS — L03211 Cellulitis of face: Secondary | ICD-10-CM | POA: Insufficient documentation

## 2019-06-03 LAB — COMPREHENSIVE METABOLIC PANEL
ALT: 51 U/L — ABNORMAL HIGH (ref 0–44)
AST: 40 U/L (ref 15–41)
Albumin: 3.9 g/dL (ref 3.5–5.0)
Alkaline Phosphatase: 150 U/L — ABNORMAL HIGH (ref 38–126)
Anion gap: 13 (ref 5–15)
BUN: 10 mg/dL (ref 6–20)
CO2: 22 mmol/L (ref 22–32)
Calcium: 9.4 mg/dL (ref 8.9–10.3)
Chloride: 104 mmol/L (ref 98–111)
Creatinine, Ser: 0.96 mg/dL (ref 0.44–1.00)
GFR calc Af Amer: >60 mL/min (ref >60)
GFR calc non Af Amer: >60 mL/min (ref >60)
Glucose, Bld: 88 mg/dL (ref 70–99)
Potassium: 3.9 mmol/L (ref 3.5–5.1)
Sodium: 139 mmol/L (ref 135–145)
Total Bilirubin: 0.8 mg/dL (ref 0.3–1.2)
Total Protein: 7.2 g/dL (ref 6.5–8.1)

## 2019-06-03 LAB — CBC WITH DIFFERENTIAL/PLATELET
Abs Immature Granulocytes: 0.04 10*3/uL (ref 0.00–0.07)
Basophils Absolute: 0.1 10*3/uL (ref 0.0–0.1)
Basophils Relative: 1 %
Eosinophils Absolute: 0.2 10*3/uL (ref 0.0–0.5)
Eosinophils Relative: 2 %
HCT: 40.1 % (ref 36.0–46.0)
Hemoglobin: 13.6 g/dL (ref 12.0–15.0)
Immature Granulocytes: 0 %
Lymphocytes Relative: 28 %
Lymphs Abs: 3.1 10*3/uL (ref 0.7–4.0)
MCH: 34.6 pg — ABNORMAL HIGH (ref 26.0–34.0)
MCHC: 33.9 g/dL (ref 30.0–36.0)
MCV: 102 fL — ABNORMAL HIGH (ref 80.0–100.0)
Monocytes Absolute: 0.8 10*3/uL (ref 0.1–1.0)
Monocytes Relative: 7 %
Neutro Abs: 7 10*3/uL (ref 1.7–7.7)
Neutrophils Relative %: 62 %
Platelets: 232 10*3/uL (ref 150–400)
RBC: 3.93 MIL/uL (ref 3.87–5.11)
RDW: 13.3 % (ref 11.5–15.5)
WBC: 11.2 10*3/uL — ABNORMAL HIGH (ref 4.0–10.5)
nRBC: 0 % (ref 0.0–0.2)

## 2019-06-03 MED ORDER — CLINDAMYCIN HCL 300 MG PO CAPS: 300.0000 mg | ORAL_CAPSULE | Freq: Three times a day (TID) | ORAL | 0 refills | Status: DC

## 2019-06-03 MED ORDER — DIPHENHYDRAMINE HCL 25 MG PO CAPS
25.0000 mg | ORAL_CAPSULE | Freq: Once | ORAL | Status: AC
  Administered 2019-06-03: 18:00:00 25 mg via ORAL
  Filled 2019-06-03: qty 1

## 2019-06-03 MED ORDER — SODIUM CHLORIDE 0.9 % IV BOLUS
1000.0000 mL | Freq: Once | INTRAVENOUS | Status: AC
  Administered 2019-06-03: 1000 mL via INTRAVENOUS

## 2019-06-03 MED ORDER — FAMOTIDINE IN NACL 20-0.9 MG/50ML-% IV SOLN
20.0000 mg | Freq: Once | INTRAVENOUS | Status: AC
  Administered 2019-06-03: 18:00:00 20 mg via INTRAVENOUS
  Filled 2019-06-03: qty 50

## 2019-06-03 MED ORDER — DIPHENHYDRAMINE HCL 25 MG PO TABS: 25.0000 mg | ORAL_TABLET | Freq: Four times a day (QID) | ORAL | 0 refills | Status: DC | PRN

## 2019-06-03 MED ORDER — CLINDAMYCIN HCL 150 MG PO CAPS: 450.0000 mg | ORAL_CAPSULE | Freq: Three times a day (TID) | ORAL | 0 refills | Status: AC

## 2019-06-03 MED ORDER — SODIUM CHLORIDE 0.9 % IV BOLUS: 1000.0000 mL | Freq: Once | INTRAVENOUS | Status: DC

## 2019-06-03 MED ORDER — ONDANSETRON 4 MG PO TBDP: 4.0000 mg | ORAL_TABLET | Freq: Three times a day (TID) | ORAL | 0 refills | Status: DC | PRN

## 2019-06-03 MED ORDER — MORPHINE SULFATE (PF) 2 MG/ML IV SOLN
2.0000 mg | Freq: Once | INTRAVENOUS | Status: AC
  Administered 2019-06-03: 18:00:00 2 mg via INTRAVENOUS
  Filled 2019-06-03: qty 1

## 2019-06-03 NOTE — ED Provider Notes (Signed)
[facility] DEPT Provider Note   CSN: [id number] Arrival date & time: 06/03/19  1548     History Chief Complaint  Patient presents with  . Facial Swelling    Anna Garcia is a 39 y.o. female.  HPI Patient is a 39 year old female with a past medical history of poorly controlled DM 1, hypertension, gastroparesis and sialoadenitis--sublingual.  Patient states that she has had left upper dental pain for some time which is worse with chewing and is followed closely by her dentist as she has had several root canals in the past.  Patient states that Saturday night she noticed worsening dental sensitivity of the left upper molars and on Sunday she noted some swelling.  She states that on Monday she went to the dentist who placed her on amoxicillin with concern for infection.  She states it is worsened since that time and become more more painful.  She states it is a dull constant ache which has become severe.  She states that the pain is worse in her left cheek and states that she has some puffiness of her left lower eyelid.  She states she called her dentist who then recommended that she follow-up and ED for further evaluation.  Patient denies any other new medications besides the amoxicillin which she states she has not been on the past but has no known drug allergies.  Patient denies any new nausea or vomiting but states that she is chronic nausea and vomiting due to her gastroparesis.  Denies any fever, chills, abdominal pain, headache, dizziness.  She denies any wheezing or shortness of breath.  Denies any difficulty swallowing or speaking.  Denies any voice changes.     Past Medical History:  Diagnosis Date  . Diabetes mellitus   . Hypertension   . Neuropathy     Patient Active Problem List   Diagnosis Date Noted  . DIABETES MELLITUS, I 06/19/2006  . NEUROPATHY, PERIPHERAL 06/19/2006  . 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 VOMITING W/NAUSEA 06/19/2006    History  reviewed. No pertinent surgical history.   OB History   No obstetric history on file.     No family history on file.  Social History   Tobacco Use  . Smoking status: Current Every Day Smoker    Packs/day: 1.00  Substance Use Topics  . Alcohol use: No  . Drug use: No    Home Medications Prior to Admission medications   Medication Sig Start Date End Date Taking? Authorizing Provider  albuterol (VENTOLIN HFA) 108 (90 Base) MCG/ACT inhaler Inhale 2 puffs into the lungs every 6 (six) hours as needed for wheezing or shortness of breath.   Yes [provider]  amitriptyline (ELAVIL) 100 MG tablet Take 200 mg by mouth at bedtime. 05/24/19  Yes [provider]  amoxicillin (AMOXIL) 500 MG capsule Take 1,000 mg by mouth 2 (two) times daily.  05/31/19  Yes [provider]  APIDRA SOLOSTAR 100 UNIT/ML Solostar Pen Inject 0-5 Units into the skin daily as needed (high blood pressure).  04/26/19  Yes [provider]  bethanechol (URECHOLINE) 10 MG tablet Take 10 mg by mouth in the morning and at bedtime.   Yes [provider]  cetirizine (ZYRTEC) 10 MG tablet Take 10 mg by mouth at bedtime.   Yes [provider]  DEXILANT 60 MG capsule Take 1 capsule by mouth daily. 05/24/19  Yes [provider]  famotidine (PEPCID) 40 MG tablet Take 40 mg by mouth 2 (  two) times daily. 05/28/19  Yes [provider]  losartan (COZAAR) 25 MG tablet Take 25 mg by mouth daily. 05/24/19  Yes [provider]  montelukast (SINGULAIR) 10 MG tablet Take 10 mg by mouth at bedtime. 05/24/19  Yes [provider]  rosuvastatin (CRESTOR) 20 MG tablet Take 20 mg by mouth daily. 05/24/19  Yes [provider]  TRESIBA FLEXTOUCH 100 UNIT/ML SOPN FlexTouch Pen Inject 15 Units into the skin daily.  03/10/19  Yes [provider]  clindamycin (CLEOCIN) 150 MG capsule Take 3 capsules (450 mg total) by mouth 3 (three) times daily for 7 days.  06/03/19 06/10/19  Tedd Sias, PA  diphenhydrAMINE (BENADRYL) 25 MG tablet Take 1 tablet (25 mg total) by mouth every 6 (six) hours as needed. 06/03/19   Tedd Sias, PA  ondansetron (ZOFRAN ODT) 4 MG disintegrating tablet Take 1 tablet (4 mg total) by mouth every 8 (eight) hours as needed for nausea or vomiting. 06/03/19   Tedd Sias, PA    Allergies    Patient has no known allergies.  Review of Systems   Review of Systems  Constitutional: Negative for chills and fever.  HENT: Negative for congestion.        Facial swelling  Eyes: Negative for pain.  Respiratory: Negative for cough and shortness of breath.   Cardiovascular: Negative for chest pain and leg swelling.  Gastrointestinal: Negative for abdominal pain and vomiting.  Genitourinary: Negative for dysuria.  Musculoskeletal: Negative for myalgias.  Skin: Negative for rash.  Neurological: Negative for dizziness and headaches.    Physical Exam Updated Vital Signs BP (!) 141/94   Pulse (!) 103   Temp 98.2 F (36.8 C) (Rectal)   Resp 18   LMP 05/05/2019   SpO2 99%   Physical Exam Vitals and nursing note reviewed.  Constitutional:      General: She is not in acute distress. HENT:     Head: Normocephalic and atraumatic.     Comments: Patient has swelling of the left lower eyelid mild/trace swelling of left upper eyelid.  No eye pain with palpation globes are soft.  No conjunctival abnormalities.  No ophthalmoplegia or proptosis.    Nose: Nose normal.     Mouth/Throat:     Mouth: Mucous membranes are moist.     Comments: Posterior pharynx clear with no erythema swelling or tonsillar enlargement.  Some tenderness to palpation of the left cheek Eyes:     General: No scleral icterus.    Extraocular Movements: Extraocular movements intact.     Conjunctiva/sclera: Conjunctivae normal.     Pupils: Pupils are equal, round, and reactive to light.  Neck:     Comments: No cervical lymphadenopathy  Cardiovascular:       Rate and Rhythm: Normal rate and regular rhythm.     Pulses: Normal pulses.     Heart sounds: Normal heart sounds.     Comments: Heart rate is 96 regular Pulmonary:     Effort: Pulmonary effort is normal. No respiratory distress.     Breath sounds: No wheezing.     Comments: No wheezing, no increased work of breathing, speaking full sentences without difficulty Abdominal:     Palpations: Abdomen is soft.     Tenderness: There is no abdominal tenderness.  Musculoskeletal:     Cervical back: Normal range of motion.     Right lower leg: No edema.     Left lower leg: No edema.  Skin:  General: Skin is warm and dry.     Capillary Refill: Capillary refill takes less than 2 seconds.  Neurological:     Mental Status: She is alert. Mental status is at baseline.  Psychiatric:        Mood and Affect: Mood normal.        Behavior: Behavior normal.           ED Results / Procedures / Treatments   Labs (all labs ordered are listed, but only abnormal results are displayed) Labs Reviewed  CBC WITH DIFFERENTIAL/PLATELET - Abnormal; Notable for the following components:      Result Value   WBC 11.2 (*)    MCV 102.0 (*)    MCH 34.6 (*)    All other components within normal limits  COMPREHENSIVE METABOLIC PANEL - Abnormal; Notable for the following components:   ALT 51 (*)    Alkaline Phosphatase 150 (*)    All other components within normal limits    EKG None  Radiology No results found.  Procedures Procedures (including critical care time)  Medications Ordered in ED Medications  morphine 2 MG/ML injection 2 mg (2 mg Intravenous Given 06/03/19 1809)  diphenhydrAMINE (BENADRYL) capsule 25 mg (25 mg Oral Given 06/03/19 1814)  famotidine (PEPCID) IVPB 20 mg premix (0 mg Intravenous Stopped 06/03/19 1843)  sodium chloride 0.9 % bolus 1,000 mL (0 mLs Intravenous Stopped 06/03/19 1856)    ED Course  I have reviewed the triage vital signs and the nursing notes.  Pertinent  labs & imaging results that were available during my care of the patient were reviewed by me and considered in my medical decision making (see chart for details).    MDM Rules/Calculators/A&P                      Patient is a type I diabetic presented today for initial swelling.  He has no specific symptoms although she is tachycardic she states that this is baseline for her and on my review of EMR it appears that she has been tachycardic to the 110s in the past when she was found to have an organic cause of disease during prior visits.  Her physical exam is notable for swelling on the left lower eyelid with hand swelling around the left upper eyelid, left cheek tenderness without warmth, redness or induration or fluctuance.  Considered dangerous/emergent causes of facial swelling such as orbital cellulitis, mucomycosis, dry mouth, Ludwig's angina and the seems significantly less likely been localized cellulitis.  She has been on amoxicillin for 3 days now.  As I have some suspicion Gershon Mussel could be related to a dental abscess as she has poor dentition left upper molars I will prescribe clindamycin and have patient discontinue amoxicillin.  She was given 1 L of normal saline, Benadryl, famotidine and 2 mg of morphine and feels significantly improved.  Her facial swelling did not improve during her ED visit however.  She has no wheezing, abdominal pain, rash or other indications of allergic reaction/anaphylaxis.  She has no difficulty breathing or swallowing.  There is no oral or pharyngeal involvement.  I will discharge patient with clindamycin and recommendations use Benadryl regularly for the next 3 days.  She will follow up with her dentist who was interviewed to ED.  She has unremarkable lab work that shows no leukocytosis or anemia and her blood sugar is within normal limits and that she is probably not fully uncontrolled her diabetes.  This makes  mucomycosis less likely.  I discussed with patient my  concerns and recommend that she return to ED if symptoms worsen or she has any new or concerning symptoms.  ------------------------------------- Anna Garcia was evaluated in Emergency Department on 06/04/2019 for the symptoms described in the history of present illness. She was evaluated in the context of the global COVID-19 pandemic, which necessitated consideration that the patient might be at risk for infection with the SARS-CoV-2 virus that causes COVID-19. Institutional protocols and algorithms that pertain to the evaluation of patients at risk for COVID-19 are in a state of rapid change based on information released by regulatory bodies including the CDC and federal and state organizations. These policies and algorithms were followed during the patient's care in the ED. The medical records were personally reviewed by myself. I personally reviewed all lab results and interpreted all imaging studies and either concurred with their official read or contacted radiology for clarification.   This patient appears reasonably screened and I doubt any other medical condition requiring further workup, evaluation, or treatment in the ED at this time prior to discharge.   Patient's vitals are WNL apart from vital sign abnormalities discussed above, patient is in NAD, and able to ambulate in the ED at their baseline and able to tolerate PO.  Pain has been managed or a plan has been made for home management and has no complaints prior to discharge. Patient is comfortable with above plan and for discharge at this time. All questions were answered prior to disposition. Results from the ER workup discussed with the patient face to face and all questions answered to the best of my ability. The patient is safe for discharge with strict return precautions. Patient appears safe for discharge with appropriate follow-up. Conveyed my impression with the patient and they voiced understanding and are agreeable to plan.    An After Visit Summary was printed and given to the patient.  Portions of this note were generated with [employer]. Dictation errors may occur despite best attempts at proofreading.   I discussed this case with my attending physician who cosigned this note including patient's presenting symptoms, physical exam, and planned diagnostics and interventions. Attending physician stated agreement with plan or made changes to plan which were implemented.     Final Clinical Impression(s) / ED Diagnoses Final diagnoses:  Facial cellulitis  Pain, dental    Rx / DC Orders ED Discharge Orders         Ordered    diphenhydrAMINE (BENADRYL) 25 MG tablet  Every 6 hours PRN     06/03/19 1923    clindamycin (CLEOCIN) 300 MG capsule  3 times daily,   Status:  Discontinued     06/03/19 1923    clindamycin (CLEOCIN) 150 MG capsule  3 times daily     06/03/19 1928    ondansetron (ZOFRAN ODT) 4 MG disintegrating tablet  Every 8 hours PRN     06/03/19 1928           Tedd Sias, [place] 06/04/19 1729    Carmin Muskrat, MD 06/08/19 [phone]

## 2019-06-03 NOTE — Discharge Instructions (Addendum)
Please make an appointment with your primary care doctor to be seen within the week.  Please take clindamycin and Benadryl as I have prescribed them.  It does not seem like you are taking a lot of pills but these are low-dose tablets.  You will take 3 with each dose and you will take 3 doses per day so he will be taking 9 pills/day.  I have also prescribed Zofran if needed for any nausea you may have a fever able to tolerate your antibiotics.  You do not need to take the Zofran if you do not have any nausea.  Please use Tylenol or ibuprofen for pain.  You may use 600 mg ibuprofen every 6 hours or 1000 mg of Tylenol every 6 hours.  You may choose to alternate between the 2.  This would be most effective.  Not to exceed 4 g of Tylenol within 24 hours.  Not to exceed 3200 mg ibuprofen 24 hours.

## 2019-06-03 NOTE — ED Triage Notes (Signed)
Patient reports swelling to left face since 2/8. Sent from dentist for further evaluation. Speaking in full sentences without difficulty.

## 2019-06-03 NOTE — ED Notes (Signed)
Patient states she went to the dentist a couple days ago and was started on amoxicillin. Patient does not have any known medication allergies. Swelling to left side of face, able to breath and talk normally

## 2020-07-05 ENCOUNTER — Emergency Department (HOSPITAL_COMMUNITY): Payer: Medicare Other

## 2020-07-05 ENCOUNTER — Inpatient Hospital Stay (HOSPITAL_COMMUNITY)
Admission: EM | Admit: 2020-07-05 | Discharge: 2020-07-07 | DRG: 638 | Disposition: A | Discharge status: Home / Self Care | Payer: Medicare Other | Attending: Student | Admitting: Student

## 2020-07-05 ENCOUNTER — Other Ambulatory Visit: Payer: Self-pay

## 2020-07-05 ENCOUNTER — Encounter (HOSPITAL_COMMUNITY): Payer: Self-pay | Admitting: Family Medicine

## 2020-07-05 DIAGNOSIS — F17210 Nicotine dependence, cigarettes, uncomplicated: Secondary | ICD-10-CM | POA: Diagnosis not present

## 2020-07-05 DIAGNOSIS — E876 Hypokalemia: Secondary | ICD-10-CM | POA: Diagnosis not present

## 2020-07-05 DIAGNOSIS — I10 Essential (primary) hypertension: Secondary | ICD-10-CM | POA: Diagnosis present

## 2020-07-05 DIAGNOSIS — E1010 Type 1 diabetes mellitus with ketoacidosis without coma: Secondary | ICD-10-CM | POA: Diagnosis not present

## 2020-07-05 DIAGNOSIS — E1343 Other specified diabetes mellitus with diabetic autonomic (poly)neuropathy: Secondary | ICD-10-CM | POA: Insufficient documentation

## 2020-07-05 DIAGNOSIS — E860 Dehydration: Secondary | ICD-10-CM | POA: Diagnosis present

## 2020-07-05 DIAGNOSIS — Z79899 Other long term (current) drug therapy: Secondary | ICD-10-CM | POA: Diagnosis not present

## 2020-07-05 DIAGNOSIS — E872 Acidosis: Secondary | ICD-10-CM | POA: Diagnosis not present

## 2020-07-05 DIAGNOSIS — J45909 Unspecified asthma, uncomplicated: Secondary | ICD-10-CM | POA: Diagnosis present

## 2020-07-05 DIAGNOSIS — R079 Chest pain, unspecified: Secondary | ICD-10-CM

## 2020-07-05 DIAGNOSIS — K3184 Gastroparesis: Secondary | ICD-10-CM | POA: Diagnosis not present

## 2020-07-05 DIAGNOSIS — J45901 Unspecified asthma with (acute) exacerbation: Secondary | ICD-10-CM | POA: Diagnosis present

## 2020-07-05 DIAGNOSIS — D696 Thrombocytopenia, unspecified: Secondary | ICD-10-CM | POA: Diagnosis present

## 2020-07-05 DIAGNOSIS — R0602 Shortness of breath: Secondary | ICD-10-CM | POA: Diagnosis present

## 2020-07-05 DIAGNOSIS — R6510 Systemic inflammatory response syndrome (SIRS) of non-infectious origin without acute organ dysfunction: Secondary | ICD-10-CM | POA: Diagnosis present

## 2020-07-05 DIAGNOSIS — Z794 Long term (current) use of insulin: Secondary | ICD-10-CM

## 2020-07-05 DIAGNOSIS — N179 Acute kidney failure, unspecified: Secondary | ICD-10-CM | POA: Diagnosis present

## 2020-07-05 DIAGNOSIS — E1110 Type 2 diabetes mellitus with ketoacidosis without coma: Secondary | ICD-10-CM | POA: Diagnosis present

## 2020-07-05 DIAGNOSIS — R7401 Elevation of levels of liver transaminase levels: Secondary | ICD-10-CM | POA: Diagnosis present

## 2020-07-05 DIAGNOSIS — R748 Abnormal levels of other serum enzymes: Secondary | ICD-10-CM | POA: Diagnosis not present

## 2020-07-05 DIAGNOSIS — R809 Proteinuria, unspecified: Secondary | ICD-10-CM | POA: Diagnosis not present

## 2020-07-05 DIAGNOSIS — R339 Retention of urine, unspecified: Secondary | ICD-10-CM | POA: Diagnosis present

## 2020-07-05 DIAGNOSIS — Z6841 Body Mass Index (BMI) 40.0 and over, adult: Secondary | ICD-10-CM

## 2020-07-05 DIAGNOSIS — R7989 Other specified abnormal findings of blood chemistry: Secondary | ICD-10-CM | POA: Diagnosis not present

## 2020-07-05 DIAGNOSIS — E871 Hypo-osmolality and hyponatremia: Secondary | ICD-10-CM | POA: Diagnosis present

## 2020-07-05 DIAGNOSIS — E875 Hyperkalemia: Secondary | ICD-10-CM | POA: Diagnosis present

## 2020-07-05 DIAGNOSIS — Z20822 Contact with and (suspected) exposure to covid-19: Secondary | ICD-10-CM | POA: Diagnosis not present

## 2020-07-05 DIAGNOSIS — J4521 Mild intermittent asthma with (acute) exacerbation: Secondary | ICD-10-CM | POA: Diagnosis not present

## 2020-07-05 DIAGNOSIS — E1065 Type 1 diabetes mellitus with hyperglycemia: Secondary | ICD-10-CM | POA: Diagnosis not present

## 2020-07-05 HISTORY — DX: Gastroparesis: K31.84

## 2020-07-05 LAB — CBC WITH DIFFERENTIAL/PLATELET
Abs Immature Granulocytes: 0.17 10*3/uL — ABNORMAL HIGH (ref 0.00–0.07)
Abs Immature Granulocytes: 0.05 10*3/uL (ref 0.00–0.07)
Basophils Absolute: 0.2 10*3/uL — ABNORMAL HIGH (ref 0.0–0.1)
Basophils Absolute: 0 10*3/uL (ref 0.0–0.1)
Basophils Relative: 1 %
Basophils Relative: 0 %
Eosinophils Absolute: 0.2 10*3/uL (ref 0.0–0.5)
Eosinophils Absolute: 0 10*3/uL (ref 0.0–0.5)
Eosinophils Relative: 1 %
Eosinophils Relative: 0 %
HCT: 41.9 % (ref 36.0–46.0)
HCT: 32.2 % — ABNORMAL LOW (ref 36.0–46.0)
Hemoglobin: 13 g/dL (ref 12.0–15.0)
Hemoglobin: 10.8 g/dL — ABNORMAL LOW (ref 12.0–15.0)
Immature Granulocytes: 1 %
Immature Granulocytes: 0 %
Lymphocytes Relative: 24 %
Lymphocytes Relative: 13 %
Lymphs Abs: 4 10*3/uL (ref 0.7–4.0)
Lymphs Abs: 1.4 10*3/uL (ref 0.7–4.0)
MCH: 35.1 pg — ABNORMAL HIGH (ref 26.0–34.0)
MCH: 35.1 pg — ABNORMAL HIGH (ref 26.0–34.0)
MCHC: 31 g/dL (ref 30.0–36.0)
MCHC: 33.5 g/dL (ref 30.0–36.0)
MCV: 113.2 fL — ABNORMAL HIGH (ref 80.0–100.0)
MCV: 104.5 fL — ABNORMAL HIGH (ref 80.0–100.0)
Monocytes Absolute: 1.5 10*3/uL — ABNORMAL HIGH (ref 0.1–1.0)
Monocytes Absolute: 0.9 10*3/uL (ref 0.1–1.0)
Monocytes Relative: 9 %
Monocytes Relative: 8 %
Neutro Abs: 11 10*3/uL — ABNORMAL HIGH (ref 1.7–7.7)
Neutro Abs: 8.9 10*3/uL — ABNORMAL HIGH (ref 1.7–7.7)
Neutrophils Relative %: 64 %
Neutrophils Relative %: 79 %
Platelets: 309 10*3/uL (ref 150–400)
Platelets: 176 10*3/uL (ref 150–400)
RBC: 3.7 MIL/uL — ABNORMAL LOW (ref 3.87–5.11)
RBC: 3.08 MIL/uL — ABNORMAL LOW (ref 3.87–5.11)
RDW: 14 % (ref 11.5–15.5)
RDW: 13.8 % (ref 11.5–15.5)
WBC: 17 10*3/uL — ABNORMAL HIGH (ref 4.0–10.5)
WBC: 11.3 10*3/uL — ABNORMAL HIGH (ref 4.0–10.5)
nRBC: 0 % (ref 0.0–0.2)
nRBC: 0 % (ref 0.0–0.2)

## 2020-07-05 LAB — BASIC METABOLIC PANEL
Anion gap: 30 — ABNORMAL HIGH (ref 5–15)
Anion gap: 25 — ABNORMAL HIGH (ref 5–15)
Anion gap: 19 — ABNORMAL HIGH (ref 5–15)
Anion gap: 15 (ref 5–15)
Anion gap: 13 (ref 5–15)
Anion gap: 14 (ref 5–15)
BUN: 26 mg/dL — ABNORMAL HIGH (ref 6–20)
BUN: 26 mg/dL — ABNORMAL HIGH (ref 6–20)
BUN: 28 mg/dL — ABNORMAL HIGH (ref 6–20)
BUN: 28 mg/dL — ABNORMAL HIGH (ref 6–20)
BUN: 27 mg/dL — ABNORMAL HIGH (ref 6–20)
BUN: 25 mg/dL — ABNORMAL HIGH (ref 6–20)
CO2: <7 mmol/L — ABNORMAL LOW (ref 22–32)
CO2: 8 mmol/L — ABNORMAL LOW (ref 22–32)
CO2: 13 mmol/L — ABNORMAL LOW (ref 22–32)
CO2: 17 mmol/L — ABNORMAL LOW (ref 22–32)
CO2: 18 mmol/L — ABNORMAL LOW (ref 22–32)
CO2: 18 mmol/L — ABNORMAL LOW (ref 22–32)
Calcium: 9.4 mg/dL (ref 8.9–10.3)
Calcium: 8.8 mg/dL — ABNORMAL LOW (ref 8.9–10.3)
Calcium: 9 mg/dL (ref 8.9–10.3)
Calcium: 9.3 mg/dL (ref 8.9–10.3)
Calcium: 9.1 mg/dL (ref 8.9–10.3)
Calcium: 9.1 mg/dL (ref 8.9–10.3)
Chloride: 96 mmol/L — ABNORMAL LOW (ref 98–111)
Chloride: 96 mmol/L — ABNORMAL LOW (ref 98–111)
Chloride: 97 mmol/L — ABNORMAL LOW (ref 98–111)
Chloride: 101 mmol/L (ref 98–111)
Chloride: 101 mmol/L (ref 98–111)
Chloride: 100 mmol/L (ref 98–111)
Creatinine, Ser: 1.8 mg/dL — ABNORMAL HIGH (ref 0.44–1.00)
Creatinine, Ser: 1.83 mg/dL — ABNORMAL HIGH (ref 0.44–1.00)
Creatinine, Ser: 1.86 mg/dL — ABNORMAL HIGH (ref 0.44–1.00)
Creatinine, Ser: 1.8 mg/dL — ABNORMAL HIGH (ref 0.44–1.00)
Creatinine, Ser: 1.82 mg/dL — ABNORMAL HIGH (ref 0.44–1.00)
Creatinine, Ser: 2.12 mg/dL — ABNORMAL HIGH (ref 0.44–1.00)
GFR, Estimated: 36 mL/min — ABNORMAL LOW (ref >60)
GFR, Estimated: 36 mL/min — ABNORMAL LOW (ref >60)
GFR, Estimated: 35 mL/min — ABNORMAL LOW (ref >60)
GFR, Estimated: 36 mL/min — ABNORMAL LOW (ref >60)
GFR, Estimated: 36 mL/min — ABNORMAL LOW (ref >60)
GFR, Estimated: 30 mL/min — ABNORMAL LOW (ref >60)
Glucose, Bld: 433 mg/dL — ABNORMAL HIGH (ref 70–99)
Glucose, Bld: 251 mg/dL — ABNORMAL HIGH (ref 70–99)
Glucose, Bld: 255 mg/dL — ABNORMAL HIGH (ref 70–99)
Glucose, Bld: 198 mg/dL — ABNORMAL HIGH (ref 70–99)
Glucose, Bld: 194 mg/dL — ABNORMAL HIGH (ref 70–99)
Glucose, Bld: 198 mg/dL — ABNORMAL HIGH (ref 70–99)
Potassium: 5.4 mmol/L — ABNORMAL HIGH (ref 3.5–5.1)
Potassium: 4.7 mmol/L (ref 3.5–5.1)
Potassium: 4 mmol/L (ref 3.5–5.1)
Potassium: 3.8 mmol/L (ref 3.5–5.1)
Potassium: 3.4 mmol/L — ABNORMAL LOW (ref 3.5–5.1)
Potassium: 3.2 mmol/L — ABNORMAL LOW (ref 3.5–5.1)
Sodium: 131 mmol/L — ABNORMAL LOW (ref 135–145)
Sodium: 129 mmol/L — ABNORMAL LOW (ref 135–145)
Sodium: 129 mmol/L — ABNORMAL LOW (ref 135–145)
Sodium: 133 mmol/L — ABNORMAL LOW (ref 135–145)
Sodium: 132 mmol/L — ABNORMAL LOW (ref 135–145)
Sodium: 132 mmol/L — ABNORMAL LOW (ref 135–145)

## 2020-07-05 LAB — CBG MONITORING, ED
Glucose-Capillary: 491 mg/dL — ABNORMAL HIGH (ref 70–99)
Glucose-Capillary: 570 mg/dL (ref 70–99)
Glucose-Capillary: 412 mg/dL — ABNORMAL HIGH (ref 70–99)
Glucose-Capillary: 362 mg/dL — ABNORMAL HIGH (ref 70–99)
Glucose-Capillary: 280 mg/dL — ABNORMAL HIGH (ref 70–99)

## 2020-07-05 LAB — HEMOGLOBIN A1C
Hgb A1c MFr Bld: 10.2 % — ABNORMAL HIGH (ref 4.8–5.6)
Mean Plasma Glucose: 246.04 mg/dL

## 2020-07-05 LAB — HEPATIC FUNCTION PANEL
ALT: 55 U/L — ABNORMAL HIGH (ref 0–44)
AST: 133 U/L — ABNORMAL HIGH (ref 15–41)
Albumin: 3.6 g/dL (ref 3.5–5.0)
Alkaline Phosphatase: 135 U/L — ABNORMAL HIGH (ref 38–126)
Bilirubin, Direct: 0.1 mg/dL (ref 0.0–0.2)
Indirect Bilirubin: 1 mg/dL — ABNORMAL HIGH (ref 0.3–0.9)
Total Bilirubin: 1.1 mg/dL (ref 0.3–1.2)
Total Protein: 6.8 g/dL (ref 6.5–8.1)

## 2020-07-05 LAB — BLOOD GAS, VENOUS
Acid-base deficit: 26.5 mmol/L — ABNORMAL HIGH (ref 0.0–2.0)
Bicarbonate: 5.1 mmol/L — ABNORMAL LOW (ref 20.0–28.0)
O2 Saturation: 54.4 %
Patient temperature: 98.6
pCO2, Ven: 24.3 mmHg — ABNORMAL LOW (ref 44.0–60.0)
pH, Ven: 6.956 — CL (ref 7.250–7.430)
pO2, Ven: 44.4 mmHg (ref 32.0–45.0)

## 2020-07-05 LAB — I-STAT BETA HCG BLOOD, ED (MC, WL, AP ONLY): I-stat hCG, quantitative: <5 m[IU]/mL (ref <5)

## 2020-07-05 LAB — GLUCOSE, CAPILLARY
Glucose-Capillary: 180 mg/dL — ABNORMAL HIGH (ref 70–99)
Glucose-Capillary: 258 mg/dL — ABNORMAL HIGH (ref 70–99)
Glucose-Capillary: 280 mg/dL — ABNORMAL HIGH (ref 70–99)
Glucose-Capillary: 266 mg/dL — ABNORMAL HIGH (ref 70–99)
Glucose-Capillary: 252 mg/dL — ABNORMAL HIGH (ref 70–99)
Glucose-Capillary: 223 mg/dL — ABNORMAL HIGH (ref 70–99)
Glucose-Capillary: 204 mg/dL — ABNORMAL HIGH (ref 70–99)
Glucose-Capillary: 196 mg/dL — ABNORMAL HIGH (ref 70–99)
Glucose-Capillary: 205 mg/dL — ABNORMAL HIGH (ref 70–99)
Glucose-Capillary: 196 mg/dL — ABNORMAL HIGH (ref 70–99)
Glucose-Capillary: 205 mg/dL — ABNORMAL HIGH (ref 70–99)
Glucose-Capillary: 198 mg/dL — ABNORMAL HIGH (ref 70–99)
Glucose-Capillary: 211 mg/dL — ABNORMAL HIGH (ref 70–99)
Glucose-Capillary: 164 mg/dL — ABNORMAL HIGH (ref 70–99)
Glucose-Capillary: 166 mg/dL — ABNORMAL HIGH (ref 70–99)
Glucose-Capillary: 220 mg/dL — ABNORMAL HIGH (ref 70–99)

## 2020-07-05 LAB — LACTIC ACID, PLASMA
Lactic Acid, Venous: 2.5 mmol/L (ref 0.5–1.9)
Lactic Acid, Venous: 3.1 mmol/L (ref 0.5–1.9)
Lactic Acid, Venous: 1.7 mmol/L (ref 0.5–1.9)
Lactic Acid, Venous: 1.8 mmol/L (ref 0.5–1.9)

## 2020-07-05 LAB — MAGNESIUM: Magnesium: 1.8 mg/dL (ref 1.7–2.4)

## 2020-07-05 LAB — URIC ACID: Uric Acid, Serum: 3.7 mg/dL (ref 2.5–7.1)

## 2020-07-05 LAB — BETA-HYDROXYBUTYRIC ACID
Beta-Hydroxybutyric Acid: >8 mmol/L — ABNORMAL HIGH (ref 0.05–0.27)
Beta-Hydroxybutyric Acid: >8 mmol/L — ABNORMAL HIGH (ref 0.05–0.27)
Beta-Hydroxybutyric Acid: 2.16 mmol/L — ABNORMAL HIGH (ref 0.05–0.27)
Beta-Hydroxybutyric Acid: 0.97 mmol/L — ABNORMAL HIGH (ref 0.05–0.27)

## 2020-07-05 LAB — URINALYSIS, ROUTINE W REFLEX MICROSCOPIC
Bilirubin Urine: NEGATIVE
Glucose, UA: >=500 mg/dL — AB
Ketones, ur: 20 mg/dL — AB
Nitrite: NEGATIVE
Protein, ur: 100 mg/dL — AB
Specific Gravity, Urine: 1.007 (ref 1.005–1.030)
pH: 5 (ref 5.0–8.0)

## 2020-07-05 LAB — PROTEIN / CREATININE RATIO, URINE
Creatinine, Urine: 31.97 mg/dL
Protein Creatinine Ratio: 3.35 mg/mg{Cre} — ABNORMAL HIGH (ref 0.00–0.15)
Total Protein, Urine: 107 mg/dL

## 2020-07-05 LAB — COMPREHENSIVE METABOLIC PANEL
ALT: 64 U/L — ABNORMAL HIGH (ref 0–44)
AST: 78 U/L — ABNORMAL HIGH (ref 15–41)
Albumin: 4.7 g/dL (ref 3.5–5.0)
Alkaline Phosphatase: 182 U/L — ABNORMAL HIGH (ref 38–126)
Anion gap: 33 — ABNORMAL HIGH (ref 5–15)
BUN: 26 mg/dL — ABNORMAL HIGH (ref 6–20)
CO2: <7 mmol/L — ABNORMAL LOW (ref 22–32)
Calcium: 9.7 mg/dL (ref 8.9–10.3)
Chloride: 90 mmol/L — ABNORMAL LOW (ref 98–111)
Creatinine, Ser: 1.72 mg/dL — ABNORMAL HIGH (ref 0.44–1.00)
GFR, Estimated: 38 mL/min — ABNORMAL LOW (ref >60)
Glucose, Bld: 515 mg/dL (ref 70–99)
Potassium: 5.3 mmol/L — ABNORMAL HIGH (ref 3.5–5.1)
Sodium: 127 mmol/L — ABNORMAL LOW (ref 135–145)
Total Bilirubin: 2.1 mg/dL — ABNORMAL HIGH (ref 0.3–1.2)
Total Protein: 8.5 g/dL — ABNORMAL HIGH (ref 6.5–8.1)

## 2020-07-05 LAB — TROPONIN I (HIGH SENSITIVITY)
Troponin I (High Sensitivity): 5 ng/L (ref <18)
Troponin I (High Sensitivity): 10 ng/L (ref <18)

## 2020-07-05 LAB — D-DIMER, QUANTITATIVE: D-Dimer, Quant: 0.53 ug/mL-FEU — ABNORMAL HIGH (ref 0.00–0.50)

## 2020-07-05 LAB — RAPID URINE DRUG SCREEN, HOSP PERFORMED
Amphetamines: NOT DETECTED
Barbiturates: NOT DETECTED
Benzodiazepines: NOT DETECTED
Cocaine: NOT DETECTED
Opiates: NOT DETECTED
Tetrahydrocannabinol: NOT DETECTED

## 2020-07-05 LAB — RESP PANEL BY RT-PCR (FLU A&B, COVID) ARPGX2
Influenza A by PCR: NEGATIVE
Influenza B by PCR: NEGATIVE
SARS Coronavirus 2 by RT PCR: NEGATIVE

## 2020-07-05 LAB — PROCALCITONIN: Procalcitonin: 1.37 ng/mL

## 2020-07-05 LAB — PROTIME-INR
INR: 1.2 (ref 0.8–1.2)
Prothrombin Time: 14.7 seconds (ref 11.4–15.2)

## 2020-07-05 LAB — LIPASE, BLOOD: Lipase: 27 U/L (ref 11–51)

## 2020-07-05 LAB — HIV ANTIBODY (ROUTINE TESTING W REFLEX): HIV Screen 4th Generation wRfx: NONREACTIVE

## 2020-07-05 LAB — MRSA PCR SCREENING: MRSA by PCR: NEGATIVE

## 2020-07-05 LAB — APTT: aPTT: 33 seconds (ref 24–36)

## 2020-07-05 MED ORDER — METOCLOPRAMIDE HCL 5 MG/ML IJ SOLN
5.0000 mg | Freq: Once | INTRAMUSCULAR | Status: AC
  Administered 2020-07-05: 5 mg via INTRAVENOUS
  Filled 2020-07-05: qty 2

## 2020-07-05 MED ORDER — LACTATED RINGERS IV BOLUS: 500.0000 mL | Freq: Once | INTRAVENOUS | Status: DC

## 2020-07-05 MED ORDER — VANCOMYCIN HCL IN DEXTROSE 1-5 GM/200ML-% IV SOLN: 1000.0000 mg | INTRAVENOUS | Status: DC

## 2020-07-05 MED ORDER — SODIUM CHLORIDE 0.9 % IV SOLN
INTRAVENOUS | Status: DC | PRN
  Administered 2020-07-05: 500 mL via INTRAVENOUS

## 2020-07-05 MED ORDER — ONDANSETRON HCL 4 MG/2ML IJ SOLN
4.0000 mg | Freq: Once | INTRAMUSCULAR | Status: AC
  Administered 2020-07-05: 4 mg via INTRAVENOUS
  Filled 2020-07-05: qty 2

## 2020-07-05 MED ORDER — HYDROMORPHONE HCL 1 MG/ML IJ SOLN
0.5000 mg | INTRAMUSCULAR | Status: AC | PRN
  Administered 2020-07-05 – 2020-07-06 (×2): 1 mg via INTRAVENOUS
  Filled 2020-07-05 (×2): qty 1

## 2020-07-05 MED ORDER — ONDANSETRON HCL 4 MG/2ML IJ SOLN: 4.0000 mg | Freq: Four times a day (QID) | INTRAMUSCULAR | Status: DC | PRN

## 2020-07-05 MED ORDER — DEXTROSE 50 % IV SOLN
0.0000 mL | INTRAVENOUS | Status: DC | PRN
Start: 2020-07-05 — End: 2020-07-05

## 2020-07-05 MED ORDER — MOMETASONE FURO-FORMOTEROL FUM 100-5 MCG/ACT IN AERO
2.0000 | INHALATION_SPRAY | Freq: Two times a day (BID) | RESPIRATORY_TRACT | Status: DC
  Administered 2020-07-06 – 2020-07-07 (×2): 2 via RESPIRATORY_TRACT
  Filled 2020-07-05: qty 8.8

## 2020-07-05 MED ORDER — INSULIN REGULAR(HUMAN) IN NACL 100-0.9 UT/100ML-% IV SOLN
INTRAVENOUS | Status: DC
  Administered 2020-07-05: 2.2 [IU]/h via INTRAVENOUS
  Filled 2020-07-05: qty 100

## 2020-07-05 MED ORDER — POTASSIUM CHLORIDE CRYS ER 20 MEQ PO TBCR
40.0000 meq | EXTENDED_RELEASE_TABLET | ORAL | Status: AC
  Administered 2020-07-05: 40 meq via ORAL
  Filled 2020-07-05 (×2): qty 2

## 2020-07-05 MED ORDER — INSULIN REGULAR(HUMAN) IN NACL 100-0.9 UT/100ML-% IV SOLN
INTRAVENOUS | Status: DC
  Administered 2020-07-05: 8.5 [IU]/h via INTRAVENOUS
  Filled 2020-07-05: qty 100

## 2020-07-05 MED ORDER — DEXTROSE IN LACTATED RINGERS 5 % IV SOLN: INTRAVENOUS | Status: DC

## 2020-07-05 MED ORDER — FAMOTIDINE IN NACL 20-0.9 MG/50ML-% IV SOLN
20.0000 mg | Freq: Every day | INTRAVENOUS | Status: DC
  Administered 2020-07-05 – 2020-07-06 (×2): 20 mg via INTRAVENOUS
  Filled 2020-07-05 (×2): qty 50

## 2020-07-05 MED ORDER — MONTELUKAST SODIUM 10 MG PO TABS
10.0000 mg | ORAL_TABLET | Freq: Every day | ORAL | Status: DC
  Administered 2020-07-05 – 2020-07-06 (×2): 10 mg via ORAL
  Filled 2020-07-05 (×2): qty 1

## 2020-07-05 MED ORDER — PANTOPRAZOLE SODIUM 40 MG PO TBEC
40.0000 mg | DELAYED_RELEASE_TABLET | Freq: Every day | ORAL | Status: DC
  Administered 2020-07-05 – 2020-07-06 (×2): 40 mg via ORAL
  Filled 2020-07-05 (×2): qty 1

## 2020-07-05 MED ORDER — DEXTROSE 50 % IV SOLN: 0.0000 mL | INTRAVENOUS | Status: DC | PRN

## 2020-07-05 MED ORDER — SODIUM CHLORIDE 0.9 % IV SOLN
2.0000 g | INTRAVENOUS | Status: DC
  Administered 2020-07-06 – 2020-07-07 (×2): 2 g via INTRAVENOUS
  Filled 2020-07-05 (×2): qty 20

## 2020-07-05 MED ORDER — LORATADINE 10 MG PO TABS
10.0000 mg | ORAL_TABLET | Freq: Every day | ORAL | Status: DC
  Administered 2020-07-05 – 2020-07-06 (×2): 10 mg via ORAL
  Filled 2020-07-05 (×2): qty 1

## 2020-07-05 MED ORDER — LACTATED RINGERS IV SOLN: INTRAVENOUS | Status: DC

## 2020-07-05 MED ORDER — SODIUM CHLORIDE 0.9 % IV SOLN
1.0000 g | Freq: Once | INTRAVENOUS | Status: DC
  Filled 2020-07-05: qty 10

## 2020-07-05 MED ORDER — HEPARIN SODIUM (PORCINE) 5000 UNIT/ML IJ SOLN
5000.0000 [IU] | Freq: Three times a day (TID) | INTRAMUSCULAR | Status: DC
  Administered 2020-07-05 – 2020-07-07 (×5): 5000 [IU] via SUBCUTANEOUS
  Filled 2020-07-05 (×6): qty 1

## 2020-07-05 MED ORDER — LABETALOL HCL 5 MG/ML IV SOLN: 10.0000 mg | INTRAVENOUS | Status: DC | PRN

## 2020-07-05 MED ORDER — ALBUTEROL SULFATE HFA 108 (90 BASE) MCG/ACT IN AERS
2.0000 | INHALATION_SPRAY | RESPIRATORY_TRACT | Status: DC | PRN
  Administered 2020-07-05: 2 via RESPIRATORY_TRACT
  Filled 2020-07-05: qty 6.7

## 2020-07-05 MED ORDER — LEVALBUTEROL TARTRATE 45 MCG/ACT IN AERO
1.0000 | INHALATION_SPRAY | Freq: Four times a day (QID) | RESPIRATORY_TRACT | Status: DC | PRN
  Administered 2020-07-05 (×2): 1 via RESPIRATORY_TRACT
  Filled 2020-07-05: qty 15

## 2020-07-05 MED ORDER — MILNACIPRAN HCL 50 MG PO TABS
25.0000 mg | ORAL_TABLET | Freq: Every day | ORAL | Status: DC
  Filled 2020-07-05 (×3): qty 0.5

## 2020-07-05 MED ORDER — VANCOMYCIN HCL 1750 MG/350ML IV SOLN
1750.0000 mg | Freq: Once | INTRAVENOUS | Status: AC
  Administered 2020-07-05: 1750 mg via INTRAVENOUS
  Filled 2020-07-05: qty 350

## 2020-07-05 MED ORDER — LACTATED RINGERS IV BOLUS
1000.0000 mL | Freq: Once | INTRAVENOUS | Status: AC
  Administered 2020-07-05: 1000 mL via INTRAVENOUS

## 2020-07-05 MED ORDER — SODIUM CHLORIDE 0.9 % IV SOLN
2.0000 g | Freq: Once | INTRAVENOUS | Status: AC
  Administered 2020-07-05: 2 g via INTRAVENOUS
  Filled 2020-07-05: qty 20

## 2020-07-05 MED ORDER — CHLORHEXIDINE GLUCONATE CLOTH 2 % EX PADS
6.0000 | MEDICATED_PAD | Freq: Every day | CUTANEOUS | Status: DC
  Administered 2020-07-05 – 2020-07-06 (×3): 6 via TOPICAL

## 2020-07-05 MED ORDER — LACTATED RINGERS IV BOLUS
20.0000 mL/kg | Freq: Once | INTRAVENOUS | Status: AC
  Administered 2020-07-05: 1632 mL via INTRAVENOUS

## 2020-07-05 NOTE — ED Triage Notes (Signed)
Brought in via EMS c/o SOB starting this morning. Hx of asthma; use of rescue inhaler w/ no relief. Hx of Type 1 DM; elevated blood sugar per EMS.

## 2020-07-05 NOTE — TOC Initial Note (Signed)
Transition of Care [facility]) - Initial/Assessment Note    Patient Details  Name: Anna Garcia MRN: [id number] Date of Birth: 1980/05/22  Transition of Care [facility]) CM/SW Contact:    Leeroy Cha, RN Phone Number: 07/05/2020, 7:30 AM  Clinical Narrative:                 40 y.o. female with medical history significant for type 1 diabetes mellitus, asthma, gastroparesis, hypertension, and neuropathy, now presenting to the emergency department with 1 day of shortness of breath, abdominal discomfort, nausea, and nonbloody vomiting.  Patient reports that she went to bed the night of 07/03/2020 in her usual state of health but woke the following morning with shortness of breath, general abdominal discomfort, and nausea.  She has gone on to have several episodes of nonbloody vomiting continues to feel short of breath with labored breathing despite using her rescue inhaler.  She denies any fevers, chills, diarrhea, dysuria, flank pain, or cough.  She reports 1 prior episode of DKA when she was initially diagnosed with diabetes at age 38.  She denies any headache, focal numbness or weakness, or drug use.  She denies missing any doses of insulin.  ED Course: Upon arrival to the ED, patient is found to be afebrile, saturating 100% on room air, tachypneic, tachycardic, and with stable blood pressure.  EKG features sinus tachycardia with rate 129.  Chest x-ray negative for acute cardiopulmonary disease.  Chemistry panel notable for glucose 515, potassium 5.3, creatinine 1.72, bicarbonate <7, and mild elevations in alkaline phosphatase, transaminases, and bilirubin.  CBC with leukocytosis 17,000 and macrocytosis without anemia.  Beta hydroxybutyrate is >8.  ED physician discussed the case with critical care who recommended hospitalist admission to stepdown unit with IV fluid resuscitation and empiric antibiotics. Plan to return to home. Anion gap is 30.0 blkd gl;ucose 433, iv insulin protocol, rocephin and vanco  ready. Expected Discharge Plan: Home/Self Care Barriers to Discharge: Continued Medical Work up   Patient Goals and CMS Choice Patient states their goals for this hospitalization and ongoing recovery are:: to go home and get this under control CMS Medicare.gov Compare Post Acute Care list provided to:: Patient Choice offered to / list presented to : Patient  Expected Discharge Plan and Services Expected Discharge Plan: Home/Self Care   Discharge Planning Services: CM Consult   Living arrangements for the past 2 months: Single Family Home                                      Prior Living Arrangements/Services Living arrangements for the past 2 months: Single Family Home Lives with:: Self Patient language and need for interpreter reviewed:: Yes Do you feel safe going back to the place where you live?: Yes      Need for Family Participation in Patient Care: Yes (Comment) Care giver support system in place?: Yes (comment)   Criminal Activity/Legal Involvement Pertinent to Current Situation/Hospitalization: No - Comment as needed  Activities of Daily Living      Permission Sought/Granted                  Emotional Assessment Appearance:: Appears stated age Attitude/Demeanor/Rapport: Engaged Affect (typically observed): Calm Orientation: : Oriented to Self,Oriented to Place,Oriented to  Time,Oriented to Situation Alcohol / Substance Use: Not Applicable Psych Involvement: No (comment)  Admission diagnosis:  SOB (shortness of breath) [R06.02] DKA (diabetic ketoacidosis) ([place]) [E11.10]  Diabetic ketoacidosis without coma associated with type 1 diabetes mellitus ([place]) [E10.10] Patient Active Problem List   Diagnosis Date Noted  . DKA (diabetic ketoacidosis) ([place]) 07/05/2020  . SIRS (systemic inflammatory response syndrome) ([place]) 07/05/2020  . Asthma 07/05/2020  . AKI (acute kidney injury) ([place]) 07/05/2020  . LFT elevation 07/05/2020  . Hypertension   .  Gastroparesis   . DIABETES MELLITUS, I 06/19/2006  . NEUROPATHY, PERIPHERAL 06/19/2006  . 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 VOMITING W/NAUSEA 06/19/2006   PCP:  Pcp, No Pharmacy:   [id number] [address], [place] [address] Phone: [phone] Fax: [phone]  [facility] - [place], [place] - [address] [place] [address] Phone: [phone] Fax: [phone]     Social Determinants of Health ([place]) Interventions    Readmission Risk Interventions No flowsheet data found.

## 2020-07-05 NOTE — Progress Notes (Signed)
Date and time results received: 07/05/20 0900  (use smartphrase ".now" to insert current time)  Test: Lactic Acid Critical Value: 2.5   Name of Provider Notified: [facility]  Orders Received? Or Actions Taken?: Actions Taken: repeat lactic, continue to monitor

## 2020-07-05 NOTE — Progress Notes (Signed)
Report completed and pt rds made. Insulin gtt at 2.2 units an hour. Pt denies distress or discomfort. On going assessment

## 2020-07-05 NOTE — Progress Notes (Signed)
PROGRESS NOTE  Anna Garcia MEB:[id number] DOB: 1980/08/05   PCP: Pcp, No  Patient is from: Home  DOA: 07/05/2020 LOS: 0  Chief complaints: Shortness of breath, abdominal pain, nausea and vomiting  Brief Narrative / Interim history: 40 year old F with PMH of DM-1, asthma, gastroparesis, neuropathy, hypertension, urinary retention with intermittent self cath presenting to ED with shortness of breath, abdominal pain, nausea and vomiting for 1 day.  Reportedly 1 prior episode of DKA when she was first diagnosed with DM-1 at the age of 45.   Patient was admitted with severe DKA, SIRS, AKI, transaminitis and hyperkalemia.  Started on IVF, IV insulin, broad-spectrum antibiotics and admitted to stepdown unit.  Subjective: Seen and examined earlier this morning.  Complains chest pain that she describes as pressure-like with some discomfort in her back but pain has improved.  Breathing has improved as well.  No further nausea or vomiting.  She denies UTI symptoms but she does self cath at times.  She denies fever or chills.  Objective: Vitals:   07/05/20 0700 07/05/20 0749 07/05/20 0800 07/05/20 0900  BP: (!) 106/50  (!) 110/51 120/62  Pulse:   (!) 110 (!) 119  Resp: 17  (!) 21 19  Temp:  99.2 F (37.3 C) 99.2 F (37.3 C)   TempSrc:  Oral Oral   SpO2:   99% 98%  Weight:      Height:        Intake/Output Summary (Last 24 hours) at 07/05/2020 1202 Last data filed at 07/05/2020 1150 Gross per 24 hour  Intake 4588.95 ml  Output -  Net 4588.95 ml   Filed Weights   07/05/20 0115 07/05/20 0615  Weight: 81.6 kg (S) 109.7 kg    Examination:  GENERAL: No apparent distress.  Nontoxic. HEENT: MMM.  Vision and hearing grossly intact.  NECK: Supple.  No apparent JVD.  RESP: 100% on RA.  No IWOB.  Fair aeration bilaterally. CVS:  RRR. Heart sounds normal.  ABD/GI/GU: BS+. Abd soft, NTND.  MSK/EXT:  Moves extremities. No apparent deformity. No edema.  SKIN: no apparent skin lesion or  wound NEURO: Awake, alert and oriented appropriately.  No apparent focal neuro deficit. PSYCH: Calm. Normal affect.   Procedures:  None  Microbiology summarized: [id number] and influenza PCR nonreactive. MRSA PCR nonreactive. Blood cultures NGTD.  Assessment & Plan: Uncontrolled DM-1 with severe DKA-high pH of 6.9 with bicab <7 and AG of 33. BHA> 8 -Resuscitated with>3.5L IV bolus -Increase IV fluid to 175 cc an hour -Insulin drip as CBG monitoring -Check BMP every 4 hours -Allow ice chips and sips with meds  SIRS-no clear source of infection.  Likely mediated by severe DKA.  Blood cultures NGTD. Lactic acid 2.5>> 3.1.  CXR without acute finding.  Lung exam is reassuring.  She is saturating 100%.  Urinalysis and urine culture has not been sent yet. -Check procalcitonin -Recheck CBC with differential -Discontinue vancomycin -Continue IV ceftriaxone pending UA and urine culture  Acute kidney injury/azotemia: Cr was 0.96 in 05/2019.  There could be some element of CKD from uncontrolled diabetes. Recent Labs    07/05/20 0107 07/05/20 0319 07/05/20 0801 07/05/20 1001  BUN 26* 26* 26* 28*  CREATININE 1.72* 1.80* 1.83* 1.86*  -Urinalysis -Check UPC -Continue IV fluid  Hyperkalemia: Likely shifting in the setting of acidosis -Continue monitoring  Elevated liver enzymes/hyperbilirubinemia: Abdominal exam benign. -Recheck -Further work-up if no improvement  Asthma exacerbation: Presented with shortness of breath and chest tightness -Add Dulera -Continue  as needed Xopenex in the setting of tachycardia  Chest pressure/tightness: Troponin and EKG negative.  CXR without acute finding.  Likely from asthma -Management as above  Lactic acidosis: Likely from DKA, dehydration and delayed clearance in the setting of AKI -IV fluid as above -Continue trending  Hyponatremia: Na 129.  In the setting of hyperglycemia and AKI. -Continue monitoring  Urinary retention -Patient  does self cath intermittently.  -Monitor  Morbid obesity Body mass index is 41.51 kg/m.  -Encourage lifestyle change to lose weight.       DVT prophylaxis:  heparin injection 5,000 Units Start: 07/05/20 0600  Code Status: Full code Family Communication: Patient and/or RN. Available if any question.  Level of care: Stepdown Status is: Inpatient  Remains inpatient appropriate because:Hemodynamically unstable   Dispo: The patient is from: Home              Anticipated d/c is to: Home              Patient currently is not medically stable to d/c.   Difficult to place patient No       Consultants:  PCCM on admit   Sch Meds:  Scheduled Meds: . Chlorhexidine Gluconate Cloth  6 each Topical Daily  . heparin  5,000 Units Subcutaneous Q8H   Continuous Infusions: . sodium chloride 10 mL/hr at 07/05/20 1150  . [START ON 07/06/2020] cefTRIAXone (ROCEPHIN)  IV    . dextrose 5% lactated ringers 175 mL/hr at 07/05/20 1150  . famotidine (PEPCID) IV Stopped (07/05/20 1147)  . insulin 0.6 mL/hr at 07/05/20 1150  . lactated ringers    . lactated ringers    . [START ON 07/06/2020] vancomycin     PRN Meds:.sodium chloride, dextrose, HYDROmorphone (DILAUDID) injection, labetalol, levalbuterol, ondansetron (ZOFRAN) IV  Antimicrobials: Anti-infectives (From admission, onward)   Start     Dose/Rate Route Frequency Ordered Stop   07/06/20 0600  vancomycin (VANCOCIN) IVPB 1000 mg/200 mL premix        1,000 mg 200 mL/hr over 60 Minutes Intravenous Every 24 hours 07/05/20 0551     07/06/20 0400  cefTRIAXone (ROCEPHIN) 2 g in sodium chloride 0.9 % 100 mL IVPB        2 g 200 mL/hr over 30 Minutes Intravenous Every 24 hours 07/05/20 0535     07/05/20 0345  vancomycin (VANCOREADY) IVPB 1750 mg/350 mL        1,750 mg 175 mL/hr over 120 Minutes Intravenous  Once 07/05/20 0339 07/05/20 0743   07/05/20 0345  cefTRIAXone (ROCEPHIN) 2 g in sodium chloride 0.9 % 100 mL IVPB        2 g 200  mL/hr over 30 Minutes Intravenous  Once 07/05/20 0339 07/05/20 0543   07/05/20 0330  cefTRIAXone (ROCEPHIN) 1 g in sodium chloride 0.9 % 100 mL IVPB  Status:  Discontinued        1 g 200 mL/hr over 30 Minutes Intravenous  Once 07/05/20 0330 07/05/20 0339       I have personally reviewed the following labs and images: CBC: Recent Labs  Lab 07/05/20 0107  WBC 17.0*  NEUTROABS 11.0*  HGB 13.0  HCT 41.9  MCV 113.2*  PLT 309   BMP &GFR Recent Labs  Lab 07/05/20 0107 07/05/20 0319 07/05/20 0801 07/05/20 1001  NA 127* 131* 129* 129*  K 5.3* 5.4* 4.7 4.0  CL 90* 96* 96* 97*  CO2 <7* <7* 8* 13*  GLUCOSE 515* 433* 251* 255*  BUN 26* 26*  26* 28*  CREATININE 1.72* 1.80* 1.83* 1.86*  CALCIUM 9.7 9.4 8.8* 9.0   Estimated Creatinine Clearance: 49.2 mL/min (A) (by C-G formula based on SCr of 1.86 mg/dL (H)). Liver & Pancreas: Recent Labs  Lab 07/05/20 0107  AST 78*  ALT 64*  ALKPHOS 182*  BILITOT 2.1*  PROT 8.5*  ALBUMIN 4.7   Recent Labs  Lab 07/05/20 0330  LIPASE 27   No results for input(s): AMMONIA in the last 168 hours. Diabetic: Recent Labs    07/05/20 0801  HGBA1C 10.2*   Recent Labs  Lab 07/05/20 0624 07/05/20 0725 07/05/20 0826 07/05/20 0928 07/05/20 1029  GLUCAP 180* 258* 280* 266* 252*   Cardiac Enzymes: No results for input(s): CKTOTAL, CKMB, CKMBINDEX, TROPONINI in the last 168 hours. No results for input(s): PROBNP in the last 8760 hours. Coagulation Profile: Recent Labs  Lab 07/05/20 0801  INR 1.2   Thyroid Function Tests: No results for input(s): TSH, T4TOTAL, FREET4, T3FREE, THYROIDAB in the last 72 hours. Lipid Profile: No results for input(s): CHOL, HDL, LDLCALC, TRIG, CHOLHDL, LDLDIRECT in the last 72 hours. Anemia Panel: No results for input(s): VITAMINB12, FOLATE, FERRITIN, TIBC, IRON, RETICCTPCT in the last 72 hours. Urine analysis:    Component Value Date/Time   COLORURINE YELLOW 04/11/2011 [place]  04/11/2011 1437   LABSPEC 1.029 04/11/2011 1437   PHURINE 5.0 04/11/2011 1437   GLUCOSEU >1000 (A) 04/11/2011 1437   HGBUR NEGATIVE 04/11/2011 1437   BILIRUBINUR NEGATIVE 04/11/2011 1437   KETONESUR >80 (A) 04/11/2011 1437   PROTEINUR NEGATIVE 04/11/2011 1437   UROBILINOGEN 0.2 04/11/2011 1437   NITRITE NEGATIVE 04/11/2011 1437   LEUKOCYTESUR NEGATIVE 04/11/2011 1437   Sepsis Labs: Invalid input(s): PROCALCITONIN, [place]  Microbiology: Recent Results (from the past 240 hour(s))  Resp Panel by RT-PCR (Flu A&B, Covid) Nasopharyngeal Swab     Status: None   Collection Time: 07/05/20  3:05 AM   Specimen: Nasopharyngeal Swab; Nasopharyngeal(NP) swabs in vial transport medium  Result Value Ref Range Status   SARS Coronavirus 2 by RT PCR NEGATIVE NEGATIVE Final    Comment: (NOTE) SARS-CoV-2 target nucleic acids are NOT DETECTED.  The SARS-CoV-2 RNA is generally detectable in upper respiratory specimens during the acute phase of infection. The lowest concentration of SARS-CoV-2 viral copies this assay can detect is 138 copies/mL. A negative result does not preclude SARS-Cov-2 infection and should not be used as the sole basis for treatment or other patient management decisions. A negative result may occur with  improper specimen collection/handling, submission of specimen other than nasopharyngeal swab, presence of viral mutation(s) within the areas targeted by this assay, and inadequate number of viral copies(<138 copies/mL). A negative result must be combined with clinical observations, patient history, and epidemiological information. The expected result is Negative.  Fact Sheet for Patients:  EntrepreneurPulse.com.au  Fact Sheet for Healthcare Providers:  IncredibleEmployment.be  This test is no t yet approved or cleared by the [place] FDA and  has been authorized for detection and/or diagnosis of SARS-CoV-2 by FDA under an  Emergency Use Authorization (EUA). This EUA will remain  in effect (meaning this test can be used) for the duration of the COVID-19 declaration under Section 564(b)(1) of the Act, 21 U.S.C.section 360bbb-3(b)(1), unless the authorization is terminated  or revoked sooner.       Influenza A by PCR NEGATIVE NEGATIVE Final   Influenza B by PCR NEGATIVE NEGATIVE Final    Comment: (NOTE) The Xpert Xpress SARS-CoV-2/FLU/RSV plus  assay is intended as an aid in the diagnosis of influenza from Nasopharyngeal swab specimens and should not be used as a sole basis for treatment. Nasal washings and aspirates are unacceptable for Xpert Xpress SARS-CoV-2/FLU/RSV testing.  Fact Sheet for Patients: EntrepreneurPulse.com.au  Fact Sheet for Healthcare Providers: IncredibleEmployment.be  This test is not yet approved or cleared by the [place] FDA and has been authorized for detection and/or diagnosis of SARS-CoV-2 by FDA under an Emergency Use Authorization (EUA). This EUA will remain in effect (meaning this test can be used) for the duration of the COVID-19 declaration under Section 564(b)(1) of the Act, 21 U.S.C. section 360bbb-3(b)(1), unless the authorization is terminated or revoked.  Performed at [facility], [place] [address], [place], [place] [address]   Blood culture (routine x 2)     Status: None (Preliminary result)   Collection Time: 07/05/20  3:30 AM   Specimen: BLOOD  Result Value Ref Range Status   Specimen Description   Final    BLOOD RIGHT ANTECUBITAL Performed at [facility] [address], [place], [place] [address]    Special Requests   Final    BOTTLES DRAWN AEROBIC AND ANAEROBIC Blood Culture adequate volume Performed at [facility] [address], [place], [place] [address]    Culture   Final    NO GROWTH < 12 HOURS Performed at [facility] [address] [place], [place] [address]    Report Status PENDING  Incomplete  Blood culture (routine x 2)     Status: None (Preliminary result)   Collection Time: 07/05/20  3:35 AM   Specimen: BLOOD  Result Value Ref Range Status   Specimen Description   Final    BLOOD LEFT ANTECUBITAL Performed at [facility] [address] [place], [place] [address]    Special Requests   Final    BOTTLES DRAWN AEROBIC AND ANAEROBIC Blood Culture results may not be optimal due to an inadequate volume of blood received in culture bottles Performed at [facility] [address] [place], [place] [address]    Culture   Final    NO GROWTH < 12 HOURS Performed at [facility] [address] [place], [place] [address]    Report Status PENDING  Incomplete  MRSA PCR Screening     Status: None   Collection Time: 07/05/20  6:20 AM   Specimen: Nasopharyngeal  Result Value Ref Range Status   MRSA by PCR NEGATIVE NEGATIVE Final    Comment:        The GeneXpert MRSA Assay (FDA approved for NASAL specimens only), is one component of a comprehensive MRSA colonization surveillance program. It is not intended to diagnose MRSA infection nor to guide or monitor treatment for MRSA infections. Performed at [facility], [place] [address] [place],  [address]     Radiology Studies: [facility] Chest Port 1 View  Result Date: 07/05/2020 CLINICAL DATA:  Asthma, short of breath EXAM: PORTABLE CHEST 1 VIEW COMPARISON:  None. FINDINGS: Frontal and lateral views of the chest demonstrate an unremarkable cardiac silhouette. No acute airspace disease, effusion, or pneumothorax. Areas of sclerosis throughout the visualized humeral diaphysis could reflect prior bone infarct. No acute displaced fracture. IMPRESSION: 1. No acute intrathoracic process. 2. Intramedullary sclerosis within the left humeral diaphysis, nonspecific but possibly related to prior bone infarct.  Electronically Signed   By: Diana Eves.D.  On: 07/05/2020 02:52    Additional 40 minutes with more than 50% spent in reviewing records, counseling patient/family and coordinating care.   Taye T. [place]  If 7PM-7AM, please contact night-coverage [email or web address] 07/05/2020, 12:02 PM

## 2020-07-05 NOTE — ED Provider Notes (Addendum)
[facility] DEPT Provider Note   CSN: [id number] Arrival date & time: 07/05/20  0058     History Chief Complaint  Patient presents with  . Shortness of Breath  . Hyperglycemia    Anna Garcia is a 40 y.o. female.  Patient with history of "brittle" diabetes and asthma presents to the emergency department with difficulty breathing.  She reports that she has been feeling tightness in her chest all day.  She has a pain in her back when she takes a deep breath.  No cough or fever.  Patient reports that her blood sugars have been running very high for the last few days.  No associated nausea, vomiting, diarrhea.  She comes to the ER by ambulance.  EMS report lungs have been clear, oxygen saturations have been normal.        Past Medical History:  Diagnosis Date  . Diabetes mellitus   . Hypertension   . Neuropathy     Patient Active Problem List   Diagnosis Date Noted  . DIABETES MELLITUS, I 06/19/2006  . NEUROPATHY, PERIPHERAL 06/19/2006  . 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 VOMITING W/NAUSEA 06/19/2006    No past surgical history on file.   OB History   No obstetric history on file.     No family history on file.  Social History   Tobacco Use  . Smoking status: Current Every Day Smoker    Packs/day: 1.00  Substance Use Topics  . Alcohol use: No  . Drug use: No    Home Medications Prior to Admission medications   Medication Sig Start Date End Date Taking? Authorizing Provider  losartan (COZAAR) 25 MG tablet Take 25 mg by mouth daily. 05/24/19  Yes [provider]  Milnacipran HCl 25 MG TABS Take by mouth. Take 1 tablet (25 mg total) by mouth 2 times daily for 14 days, THEN 2 tablets (50 mg total) 2 times daily for 16 days 06/26/20 07/26/20 Yes [provider]  albuterol (VENTOLIN HFA) 108 (90 Base) MCG/ACT inhaler Inhale 2 puffs into the lungs every 6 (six) hours as needed for wheezing or shortness of breath.    [provider]  amitriptyline (ELAVIL) 25 MG tablet Take 25 mg by mouth at bedtime. Increase up to 1 tablet weekly, Max up to 4 tablets 06/13/20   [provider]  amoxicillin (AMOXIL) 500 MG capsule Take 1,000 mg by mouth 2 (two) times daily.  05/31/19   [provider]  APIDRA SOLOSTAR 100 UNIT/ML Solostar Pen Inject 0-5 Units into the skin daily as needed (high blood pressure).  04/26/19   [provider]  bethanechol (URECHOLINE) 10 MG tablet Take 10 mg by mouth in the morning and at bedtime. Patient not taking: Reported on 07/05/2020    [provider]  cetirizine (ZYRTEC) 10 MG tablet Take 10 mg by mouth at bedtime.    [provider]  DEXILANT 60 MG capsule Take 1 capsule by mouth daily. 05/24/19   [provider]  diphenhydrAMINE (BENADRYL) 25 MG tablet Take 1 tablet (25 mg total) by mouth every 6 (six) hours as needed. 06/03/19   Tedd Sias, PA  famotidine (PEPCID) 40 MG tablet Take 40 mg by mouth 2 (two) times daily. 05/28/19   [provider]  insulin glargine (LANTUS SOLOSTAR) 100 UNIT/ML Solostar Pen Inject 6-10 Units into the skin in the morning and at bedtime. 06/19/20   [provider]  montelukast (SINGULAIR) 10 MG tablet Take  10 mg by mouth at bedtime. 05/24/19   [provider]  ondansetron (ZOFRAN ODT) 4 MG disintegrating tablet Take 1 tablet (4 mg total) by mouth every 8 (eight) hours as needed for nausea or vomiting. Patient not taking: Reported on 07/05/2020 06/03/19   Tedd Sias, PA  rosuvastatin (CRESTOR) 20 MG tablet Take 20 mg by mouth daily. 05/24/19   [provider]  TRULANCE 3 MG TABS Take 1 tablet by mouth daily. Patient not taking: No sig reported 05/11/20   [provider]  XIFAXAN 550 MG TABS tablet Take 550 mg by mouth 2 (two) times daily. Patient not taking: Reported on 07/05/2020 06/14/20   [provider]    Allergies    Patient has no known allergies.  Review of  Systems   Review of Systems  Respiratory: Positive for chest tightness and shortness of breath.   All other systems reviewed and are negative.   Physical Exam Updated Vital Signs BP (!) 143/71   Pulse (!) 123   Temp 98.1 F (36.7 C) (Oral)   Resp (!) 24   Ht 5\' 4"  (1.626 m)   Wt 81.6 kg   LMP  (LMP Unknown)   SpO2 100%   BMI 30.90 kg/m   Physical Exam Vitals and nursing note reviewed.  Constitutional:      General: She is not in acute distress.    Appearance: Normal appearance. She is well-developed.  HENT:     Head: Normocephalic and atraumatic.     Right Ear: Hearing normal.     Left Ear: Hearing normal.     Nose: Nose normal.  Eyes:     Conjunctiva/sclera: Conjunctivae normal.     Pupils: Pupils are equal, round, and reactive to light.  Cardiovascular:     Rate and Rhythm: Regular rhythm.     Heart sounds: S1 normal and S2 normal. No murmur heard. No friction rub. No gallop.   Pulmonary:     Effort: Pulmonary effort is normal. Tachypnea present. No respiratory distress.     Breath sounds: Normal breath sounds.  Chest:     Chest wall: No tenderness.  Abdominal:     General: Bowel sounds are normal.     Palpations: Abdomen is soft.     Tenderness: There is no abdominal tenderness. There is no guarding or rebound. Negative signs include Murphy's sign and McBurney's sign.     Hernia: No hernia is present.  Musculoskeletal:        General: Normal range of motion.     Cervical back: Normal range of motion and neck supple.  Skin:    General: Skin is warm and dry.     Findings: No rash.  Neurological:     Mental Status: She is alert and oriented to person, place, and time.     GCS: GCS eye subscore is 4. GCS verbal subscore is 5. GCS motor subscore is 6.     Cranial Nerves: No cranial nerve deficit.     Sensory: No sensory deficit.     Coordination: Coordination normal.  Psychiatric:        Speech: Speech normal.        Behavior: Behavior normal.         Thought Content: Thought content normal.     ED Results / Procedures / Treatments   Labs (all labs ordered are listed, but only abnormal results are displayed) Labs Reviewed  CBC WITH DIFFERENTIAL/PLATELET - Abnormal; Notable for the following components:  Result Value   WBC 17.0 (*)    RBC 3.70 (*)    MCV 113.2 (*)    MCH 35.1 (*)    Neutro Abs 11.0 (*)    Monocytes Absolute 1.5 (*)    Basophils Absolute 0.2 (*)    Abs Immature Granulocytes 0.17 (*)    All other components within normal limits  COMPREHENSIVE METABOLIC PANEL - Abnormal; Notable for the following components:   Sodium 127 (*)    Potassium 5.3 (*)    Chloride 90 (*)    CO2 <7 (*)    Glucose, Bld 515 (*)    BUN 26 (*)    Creatinine, Ser 1.72 (*)    Total Protein 8.5 (*)    AST 78 (*)    ALT 64 (*)    Alkaline Phosphatase 182 (*)    Total Bilirubin 2.1 (*)    GFR, Estimated 38 (*)    Anion gap 33.0 (*)    All other components within normal limits  BETA-HYDROXYBUTYRIC ACID - Abnormal; Notable for the following components:   Beta-Hydroxybutyric Acid >8.00 (*)    All other components within normal limits  D-DIMER, QUANTITATIVE - Abnormal; Notable for the following components:   D-Dimer, Quant 0.53 (*)    All other components within normal limits  BLOOD GAS, VENOUS - Abnormal; Notable for the following components:   pH, Ven 6.956 (*)    pCO2, Ven 24.3 (*)    Bicarbonate 5.1 (*)    Acid-base deficit 26.5 (*)    All other components within normal limits  CBG MONITORING, ED - Abnormal; Notable for the following components:   Glucose-Capillary 491 (*)    All other components within normal limits  RESP PANEL BY RT-PCR (FLU A&B, COVID) ARPGX2  CULTURE, BLOOD (ROUTINE X 2)  CULTURE, BLOOD (ROUTINE X 2)  URINALYSIS, ROUTINE W REFLEX MICROSCOPIC  BASIC METABOLIC PANEL  LIPASE, BLOOD  RAPID URINE DRUG SCREEN, HOSP PERFORMED  I-STAT BETA HCG BLOOD, ED ([facility], [facility], [facility] ONLY)    EKG EKG  Interpretation  Date/Time:  Wednesday July 05 2020 01:19:40 EDT Ventricular Rate:  129 PR Interval:    QRS Duration: 92 QT Interval:  317 QTC Calculation: 465 R Axis:   87 Text Interpretation: Sinus tachycardia Low voltage, precordial leads Baseline wander in lead(s) II III aVF V5 Confirmed by Orpah Greek [id number]) on 07/05/2020 1:22:21 AM   Radiology DG Chest Port 1 View  Result Date: 07/05/2020 CLINICAL DATA:  Asthma, short of breath EXAM: PORTABLE CHEST 1 VIEW COMPARISON:  None. FINDINGS: Frontal and lateral views of the chest demonstrate an unremarkable cardiac silhouette. No acute airspace disease, effusion, or pneumothorax. Areas of sclerosis throughout the visualized humeral diaphysis could reflect prior bone infarct. No acute displaced fracture. IMPRESSION: 1. No acute intrathoracic process. 2. Intramedullary sclerosis within the left humeral diaphysis, nonspecific but possibly related to prior bone infarct. Electronically Signed   By: Randa Ngo M.D.   On: 07/05/2020 02:52    Procedures Procedures   Medications Ordered in ED Medications  albuterol (VENTOLIN HFA) 108 (90 Base) MCG/ACT inhaler 2 puff (2 puffs Inhalation Given 07/05/20 0158)  insulin regular, human (MYXREDLIN) 100 units/ 100 mL infusion (8.5 Units/hr Intravenous New Bag/Given 07/05/20 0256)  lactated ringers infusion (has no administration in time range)  dextrose 5 % in lactated ringers infusion (0 mLs Intravenous Hold 07/05/20 0303)  dextrose 50 % solution 0-50 mL (has no administration in time range)  lactated ringers bolus 1,000 mL (  has no administration in time range)  cefTRIAXone (ROCEPHIN) 1 g in sodium chloride 0.9 % 100 mL IVPB (has no administration in time range)  lactated ringers bolus 1,632 mL (1,632 mLs Intravenous New Bag/Given 07/05/20 0238)  ondansetron (ZOFRAN) injection 4 mg (4 mg Intravenous Given 07/05/20 0245)  metoCLOPramide (REGLAN) injection 5 mg (5 mg Intravenous Given 07/05/20  0246)    ED Course  I have reviewed the triage vital signs and the nursing notes.  Pertinent labs & imaging results that were available during my care of the patient were reviewed by me and considered in my medical decision making (see chart for details).    MDM Rules/Calculators/A&P                          Patient presents to the emergency department for evaluation of shortness of breath.  Patient does report that she has been having difficulty managing her blood sugars over the last few days.  Lungs are clear on examination at arrival, DKA was suspected.  Lab work does confirm anion gap acidosis consistent with diabetic ketoacidosis.  Patient treated with IV fluid bolus and placed on an insulin drip, will require hospitalization for further management.  Addendum: Discussed with Dr. Erin Fulling, on-call for critical care.  Does not feel that the patient requires critical care admission as she is mentating normally.  Recommends adding urine drug screen, lipase, blood cultures and covering with Rocephin and vancomycin empirically. Recommends further fluid boluses and patient can be admitted by hospitalist service.  CRITICAL CARE Performed by: Orpah Greek   Total critical care time: 35 minutes  Critical care time was exclusive of separately billable procedures and treating other patients.  Critical care was necessary to treat or prevent imminent or life-threatening deterioration.  Critical care was time spent personally by me on the following activities: development of treatment plan with patient and/or surrogate as well as nursing, discussions with consultants, evaluation of patient's response to treatment, examination of patient, obtaining history from patient or surrogate, ordering and performing treatments and interventions, ordering and review of laboratory studies, ordering and review of radiographic studies, pulse oximetry and re-evaluation of patient's condition.  Final  Clinical Impression(s) / ED Diagnoses Final diagnoses:  Diabetic ketoacidosis without coma associated with type 1 diabetes mellitus [facility])    Rx / DC Orders ED Discharge Orders    None       Tysen Roesler, Gwenyth Allegra, MD 07/05/20 0217    Orpah Greek, MD 07/05/20 0327    Orpah Greek, MD 07/05/20 0330

## 2020-07-05 NOTE — H&P (Signed)
History and Physical    AVEENA BARI AOZ:[id number] DOB: Jun 26, 1980 DOA: 07/05/2020  PCP: Pcp, No   Patient coming from: Home   Chief Complaint: SOB, abdominal discomfort, N/V   HPI: SAARAH DEWING is a 40 y.o. female with medical history significant for type 1 diabetes mellitus, asthma, gastroparesis, hypertension, and neuropathy, now presenting to the emergency department with 1 day of shortness of breath, abdominal discomfort, nausea, and nonbloody vomiting.  Patient reports that she went to bed the night of 07/03/2020 in her usual state of health but woke the following morning with shortness of breath, general abdominal discomfort, and nausea.  She has gone on to have several episodes of nonbloody vomiting continues to feel short of breath with labored breathing despite using her rescue inhaler.  She denies any fevers, chills, diarrhea, dysuria, flank pain, or cough.  She reports 1 prior episode of DKA when she was initially diagnosed with diabetes at age 9.  She denies any headache, focal numbness or weakness, or drug use.  She denies missing any doses of insulin.  ED Course: Upon arrival to the ED, patient is found to be afebrile, saturating 100% on room air, tachypneic, tachycardic, and with stable blood pressure.  EKG features sinus tachycardia with rate 129.  Chest x-ray negative for acute cardiopulmonary disease.  Chemistry panel notable for glucose 515, potassium 5.3, creatinine 1.72, bicarbonate <7, and mild elevations in alkaline phosphatase, transaminases, and bilirubin.  CBC with leukocytosis 17,000 and macrocytosis without anemia.  Beta hydroxybutyrate is >8.  ED physician discussed the case with critical care who recommended hospitalist admission to stepdown unit with IV fluid resuscitation and empiric antibiotics.   Review of Systems:  All other systems reviewed and apart from HPI, are negative.  Past Medical History:  Diagnosis Date  . Diabetes mellitus   . Gastroparesis   .  Hypertension   . Neuropathy     History reviewed. No pertinent surgical history.  Social History:   reports that she has been smoking. She has been smoking about 1.00 pack per day. She does not have any smokeless tobacco history on file. She reports that she does not drink alcohol and does not use drugs.  No Known Allergies  History reviewed. No pertinent family history.   Prior to Admission medications   Medication Sig Start Date End Date Taking? Authorizing Provider  albuterol (VENTOLIN HFA) 108 (90 Base) MCG/ACT inhaler Inhale 2 puffs into the lungs every 6 (six) hours as needed for wheezing or shortness of breath.   Yes [provider]  APIDRA SOLOSTAR 100 UNIT/ML Solostar Pen Inject 0-5 Units into the skin daily as needed (high blood pressure).  04/26/19  Yes [provider]  cetirizine (ZYRTEC) 10 MG tablet Take 10 mg by mouth at bedtime.   Yes [provider]  DEXILANT 60 MG capsule Take 1 capsule by mouth daily. 05/24/19  Yes [provider]  diphenhydrAMINE (BENADRYL) 25 MG tablet Take 1 tablet (25 mg total) by mouth every 6 (six) hours as needed. 06/03/19  Yes Fondaw, Wylder S, PA  famotidine (PEPCID) 40 MG tablet Take 40 mg by mouth 2 (two) times daily. 05/28/19  Yes [provider]  insulin glargine (LANTUS SOLOSTAR) 100 UNIT/ML Solostar Pen Inject 10-12 Units into the skin in the morning and at bedtime. During the day 12 units , at night 10 units 06/19/20  Yes [provider]  losartan (COZAAR) 25 MG tablet Take 25 mg by mouth daily. 05/24/19  Yes [provider]  Milnacipran HCl 25 MG TABS Take by mouth. Take 1 tablet (25 mg total) by mouth once daily for 14 days , then increase to 2 tablets twice daily for 16 days 06/26/20 07/26/20 Yes [provider]  montelukast (SINGULAIR) 10 MG tablet Take 10 mg by mouth at bedtime. 05/24/19  Yes [provider]  rosuvastatin (CRESTOR) 20 MG tablet Take 20 mg by mouth daily.  05/24/19  Yes [provider]  amitriptyline (ELAVIL) 25 MG tablet Take 25 mg by mouth at bedtime. Increase up to 1 tablet weekly, Max up to 4 tablets 06/13/20   [provider]  bethanechol (URECHOLINE) 10 MG tablet Take 10 mg by mouth in the morning and at bedtime. Patient not taking: No sig reported    [provider]  ondansetron (ZOFRAN ODT) 4 MG disintegrating tablet Take 1 tablet (4 mg total) by mouth every 8 (eight) hours as needed for nausea or vomiting. Patient not taking: Reported on 07/05/2020 06/03/19   Tedd Sias, PA  TRULANCE 3 MG TABS Take 1 tablet by mouth daily. Patient not taking: Reported on 07/05/2020 05/11/20   [provider]  XIFAXAN 550 MG TABS tablet Take 550 mg by mouth 2 (two) times daily. Patient not taking: No sig reported 06/14/20   [provider]    Physical Exam: Vitals:   07/05/20 0300 07/05/20 0315 07/05/20 0330 07/05/20 0544  BP: (!) 143/71 (!) 147/83  (!) 128/96  Pulse: (!) 123 (!) 123 (!) 125 (!) 120  Resp: (!) 24 (!) 26 (!) 28 20  Temp:      TempSrc:      SpO2: 100% 100% 100% 100%  Weight:      Height:        Constitutional: NAD, calm  Eyes: PERTLA, lids and conjunctivae normal ENMT: Mucous membranes are moist. Posterior pharynx clear of any exudate or lesions.   Neck: normal, supple, no masses, no thyromegaly Respiratory: tachypneic, increased WOB, no wheezing, no crackles. No pallor or cyanosis.  Cardiovascular: Rate ~120 and regular. No extremity edema.  Abdomen: No distension, soft, no rebound pain or guarding. Bowel sounds active.  Musculoskeletal: no clubbing / cyanosis. No joint deformity upper and lower extremities.   Skin: no significant rashes, lesions, ulcers. Warm, dry, well-perfused. Neurologic: CN 2-12 grossly intact. Sensation intact. Moving all extremities.  Psychiatric: Alert and oriented to person, place, and situation. Pleasant and cooperative.    Labs and Imaging on Admission:  I have personally reviewed following labs and imaging studies  CBC: Recent Labs  Lab 07/05/20 0107  WBC 17.0*  NEUTROABS 11.0*  HGB 13.0  HCT 41.9  MCV 113.2*  PLT 626   Basic Metabolic Panel: Recent Labs  Lab 07/05/20 0107 07/05/20 0319  NA 127* 131*  K 5.3* 5.4*  CL 90* 96*  CO2 <7* <7*  GLUCOSE 515* 433*  BUN 26* 26*  CREATININE 1.72* 1.80*  CALCIUM 9.7 9.4   GFR: Estimated Creatinine Clearance: 43.4 mL/min (A) (by C-G formula based on SCr of 1.8 mg/dL (H)). Liver Function Tests: Recent Labs  Lab 07/05/20 0107  AST 78*  ALT 64*  ALKPHOS 182*  BILITOT 2.1*  PROT 8.5*  ALBUMIN 4.7   Recent Labs  Lab 07/05/20 0330  LIPASE 27   No results for input(s): AMMONIA in the last 168 hours. Coagulation Profile: No results for input(s): INR, PROTIME in the last 168 hours. Cardiac Enzymes: No results for input(s): CKTOTAL, CKMB, CKMBINDEX, TROPONINI  in the last 168 hours. BNP (last 3 results) No results for input(s): PROBNP in the last 8760 hours. HbA1C: No results for input(s): HGBA1C in the last 72 hours. CBG: Recent Labs  Lab 07/05/20 0125 07/05/20 0250 07/05/20 0349 07/05/20 0433 07/05/20 0539  GLUCAP 491* 570* 412* 362* 280*   Lipid Profile: No results for input(s): CHOL, HDL, LDLCALC, TRIG, CHOLHDL, LDLDIRECT in the last 72 hours. Thyroid Function Tests: No results for input(s): TSH, T4TOTAL, FREET4, T3FREE, THYROIDAB in the last 72 hours. Anemia Panel: No results for input(s): VITAMINB12, FOLATE, FERRITIN, TIBC, IRON, RETICCTPCT in the last 72 hours. Urine analysis:    Component Value Date/Time   COLORURINE YELLOW 04/11/2011 [place] 04/11/2011 1437   LABSPEC 1.029 04/11/2011 1437   PHURINE 5.0 04/11/2011 1437   GLUCOSEU >1000 (A) 04/11/2011 1437   HGBUR NEGATIVE 04/11/2011 1437   BILIRUBINUR NEGATIVE 04/11/2011 1437   KETONESUR >80 (A) 04/11/2011 1437   PROTEINUR NEGATIVE 04/11/2011 1437   UROBILINOGEN 0.2 04/11/2011 1437    NITRITE NEGATIVE 04/11/2011 1437   LEUKOCYTESUR NEGATIVE 04/11/2011 1437   Sepsis Labs: @LABRCNTIP (procalcitonin:4,lacticidven:4) ) Recent Results (from the past 240 hour(s))  Resp Panel by RT-PCR (Flu A&B, Covid) Nasopharyngeal Swab     Status: None   Collection Time: 07/05/20  3:05 AM   Specimen: Nasopharyngeal Swab; Nasopharyngeal(NP) swabs in vial transport medium  Result Value Ref Range Status   SARS Coronavirus 2 by RT PCR NEGATIVE NEGATIVE Final    Comment: (NOTE) SARS-CoV-2 target nucleic acids are NOT DETECTED.  The SARS-CoV-2 RNA is generally detectable in upper respiratory specimens during the acute phase of infection. The lowest concentration of SARS-CoV-2 viral copies this assay can detect is 138 copies/mL. A negative result does not preclude SARS-Cov-2 infection and should not be used as the sole basis for treatment or other patient management decisions. A negative result may occur with  improper specimen collection/handling, submission of specimen other than nasopharyngeal swab, presence of viral mutation(s) within the areas targeted by this assay, and inadequate number of viral copies(<138 copies/mL). A negative result must be combined with clinical observations, patient history, and epidemiological information. The expected result is Negative.  Fact Sheet for Patients:  EntrepreneurPulse.com.au  Fact Sheet for Healthcare Providers:  IncredibleEmployment.be  This test is no t yet approved or cleared by the [place] FDA and  has been authorized for detection and/or diagnosis of SARS-CoV-2 by FDA under an Emergency Use Authorization (EUA). This EUA will remain  in effect (meaning this test can be used) for the duration of the COVID-19 declaration under Section 564(b)(1) of the Act, 21 U.S.C.section 360bbb-3(b)(1), unless the authorization is terminated  or revoked sooner.       Influenza A by PCR NEGATIVE NEGATIVE  Final   Influenza B by PCR NEGATIVE NEGATIVE Final    Comment: (NOTE) The Xpert Xpress SARS-CoV-2/FLU/RSV plus assay is intended as an aid in the diagnosis of influenza from Nasopharyngeal swab specimens and should not be used as a sole basis for treatment. Nasal washings and aspirates are unacceptable for Xpert Xpress SARS-CoV-2/FLU/RSV testing.  Fact Sheet for Patients: EntrepreneurPulse.com.au  Fact Sheet for Healthcare Providers: IncredibleEmployment.be  This test is not yet approved or cleared by the [place] FDA and has been authorized for detection and/or diagnosis of SARS-CoV-2 by FDA under an Emergency Use Authorization (EUA). This EUA will remain in effect (meaning this test can be used) for the duration of the COVID-19 declaration under Section 564(b)(1) of the  Act, 21 U.S.C. section 360bbb-3(b)(1), unless the authorization is terminated or revoked.  Performed at [facility], [place] [address], [place], [place] [address]      Radiological Exams on Admission: DG Chest Port 1 View  Result Date: 07/05/2020 CLINICAL DATA:  Asthma, short of breath EXAM: PORTABLE CHEST 1 VIEW COMPARISON:  None. FINDINGS: Frontal and lateral views of the chest demonstrate an unremarkable cardiac silhouette. No acute airspace disease, effusion, or pneumothorax. Areas of sclerosis throughout the visualized humeral diaphysis could reflect prior bone infarct. No acute displaced fracture. IMPRESSION: 1. No acute intrathoracic process. 2. Intramedullary sclerosis within the left humeral diaphysis, nonspecific but possibly related to prior bone infarct. Electronically Signed   By: Randa Ngo M.D.   On: 07/05/2020 02:52    EKG: Independently reviewed. Sinus tachycardia, rate 129.   Assessment/Plan   1. DKA; type I DM  - Type I diabetic with A1c 8.0% in August 2021 presents with labored breathing, abdominal discomfort, and N/V and is found  to have serum glucose 515 with venous pH 6.956, serum bicarb <7, AG 33, and BHOB >8  - She was fluid-resuscitated in ED and started on insulin infusion  - Continue IVF and insulin infusion with frequent CBGs and serial chem panels    2. SIRS  - WBC 17k with tachycardia and tachypnea, no fever  - Blood and urine cultures collected in ED and empiric Rocephin and vancomycin started per PCCM recommendations, will continue for now    3. Acute kidney injury; hyperkalemia  - SCr is 1.72, previously ~1  - Likely acute prerenal azotemia in setting of N/V and osmotic diuresis  - Continue IVF resuscitation, hold lisinopril, follow serial chem panels   - Serum potassium slightly elevated in setting of AKI and insulin deficiency  - Continue IVF and insulin, hold lisinopril, follow serial chem panels    4. Abnormal LFTs  - Alk phos 182, AST 78, ALT 64, and total bili 2.1  - No RUQ tenderness, lipase normal  - Trend, consider [place] if not improving  5. Asthma  - No cough or wheezing on admission  - Continue albuterol as needed   6. Hypertension  - Hold lisinopril in light of AKI/hyperkalemia, treat as-needed only for now     DVT prophylaxis: sq heparin  Code Status: Full  Level of Care: Level of care: Stepdown Family Communication: None present  Disposition Plan:  Patient is from: Home Anticipated d/c is to: Home  Anticipated d/c date is: 07/07/20 Patient currently: In severe DKA  Consults called: None  Admission status: Inpatient     Vianne Bulls, MD [facility]  07/05/2020, 5:55 AM

## 2020-07-05 NOTE — Progress Notes (Signed)
A consult was received from an ED physician for Vancomycin per pharmacy dosing.  The patient's profile has been reviewed for ht/wt/allergies/indication/available labs.   A one time order has been placed for Vancomycin 1750mg  IV. Also increase Rocephin to 2gm for "bacteremia" indication. Further antibiotics/pharmacy consults should be ordered by admitting physician if indicated.                       Thank you, Netta Cedars PharmD 07/05/2020  3:40 AM

## 2020-07-05 NOTE — Plan of Care (Signed)
  Problem: Education: Goal: Knowledge of General Education information will improve Description: Including pain rating scale, medication(s)/side effects and non-pharmacologic comfort measures Outcome: Progressing   Problem: Clinical Measurements: Goal: Diagnostic test results will improve Outcome: Progressing   Problem: Metabolic: Goal: Ability to maintain appropriate glucose levels will improve Outcome: Progressing

## 2020-07-05 NOTE — Progress Notes (Signed)
Pharmacy Antibiotic Note  Anna Garcia is a 40 y.o. female admitted on 07/05/2020 with bacteremia.  Pharmacy has been consulted for Vancomycin dosing.  Admit with DKA- AKI noted likely due to dehydration. CXR and UA negative for infection.   Plan: Rocephin per MD Vancomycin 1gm IV q24h to target AUC 400-550 Check vancomycin levels at steady state Monitor renal function and cx data   Height: 5\' 4"  (162.6 cm) Weight: 81.6 kg (180 lb) IBW/kg (Calculated) : 54.7  Temp (24hrs), Avg:98.1 F (36.7 C), Min:98.1 F (36.7 C), Max:98.1 F (36.7 C)  Recent Labs  Lab 07/05/20 0107 07/05/20 0319  WBC 17.0*  --   CREATININE 1.72* 1.80*    Estimated Creatinine Clearance: 43.4 mL/min (A) (by C-G formula based on SCr of 1.8 mg/dL (H)).    No Known Allergies  Antimicrobials this admission: 3/16 Rocephin >>  3/16 Vancomycin >>   Dose adjustments this admission:  Microbiology results: 3/16 BCx:  UCx:    Thank you for allowing pharmacy to be a part of this patients care.   Netta Cedars PharmD 07/05/2020 5:51 AM

## 2020-07-05 NOTE — Progress Notes (Signed)
Inpatient Diabetes Program Recommendations  AACE/ADA: New Consensus Statement on Inpatient Glycemic Control (2015)  Target Ranges:  Prepandial:   less than 140 mg/dL      Peak postprandial:   less than 180 mg/dL (1-2 hours)      Critically ill patients:  140 - 180 mg/dL   Lab Results  Component Value Date   GLUCAP 205 (H) 07/05/2020   HGBA1C 10.2 (H) 07/05/2020    Review of Glycemic Control  Diabetes history: DM1 Outpatient Diabetes medications: Lantus 12 in am and 10 units QHS, Apidra 0-5 units TID Current orders for Inpatient glycemic control: IV insulin per [facility]  HgbA1C - 10.2%  Inpatient Diabetes Program Recommendations:     Lantus 10 units BID (When criteria met, give 1-2 hours prior to discontinuation of drip) Novolog 0-15 units TID with meals and 0-5 HS Novolog 3 units TID with meals if eating > 50% meal  Will speak with pt in am regarding her HgbA1C and diabetes control at home.  Continue to follow.  Thank you. Lorenda Peck, RD, LDN, CDE Inpatient Diabetes Coordinator [phone]

## 2020-07-06 ENCOUNTER — Encounter (HOSPITAL_COMMUNITY): Payer: Self-pay | Admitting: Family Medicine

## 2020-07-06 ENCOUNTER — Inpatient Hospital Stay (HOSPITAL_COMMUNITY): Payer: Medicare Other

## 2020-07-06 DIAGNOSIS — J45901 Unspecified asthma with (acute) exacerbation: Secondary | ICD-10-CM

## 2020-07-06 DIAGNOSIS — R809 Proteinuria, unspecified: Secondary | ICD-10-CM | POA: Diagnosis not present

## 2020-07-06 DIAGNOSIS — E1065 Type 1 diabetes mellitus with hyperglycemia: Secondary | ICD-10-CM

## 2020-07-06 DIAGNOSIS — E876 Hypokalemia: Secondary | ICD-10-CM

## 2020-07-06 DIAGNOSIS — R748 Abnormal levels of other serum enzymes: Secondary | ICD-10-CM

## 2020-07-06 DIAGNOSIS — N179 Acute kidney failure, unspecified: Secondary | ICD-10-CM | POA: Diagnosis not present

## 2020-07-06 DIAGNOSIS — D696 Thrombocytopenia, unspecified: Secondary | ICD-10-CM

## 2020-07-06 DIAGNOSIS — E1010 Type 1 diabetes mellitus with ketoacidosis without coma: Secondary | ICD-10-CM | POA: Diagnosis not present

## 2020-07-06 LAB — BASIC METABOLIC PANEL
Anion gap: 10 (ref 5–15)
Anion gap: 13 (ref 5–15)
BUN: 24 mg/dL — ABNORMAL HIGH (ref 6–20)
BUN: 21 mg/dL — ABNORMAL HIGH (ref 6–20)
CO2: 18 mmol/L — ABNORMAL LOW (ref 22–32)
CO2: 16 mmol/L — ABNORMAL LOW (ref 22–32)
Calcium: 9.1 mg/dL (ref 8.9–10.3)
Calcium: 9 mg/dL (ref 8.9–10.3)
Chloride: 104 mmol/L (ref 98–111)
Chloride: 106 mmol/L (ref 98–111)
Creatinine, Ser: 2.27 mg/dL — ABNORMAL HIGH (ref 0.44–1.00)
Creatinine, Ser: 2.37 mg/dL — ABNORMAL HIGH (ref 0.44–1.00)
GFR, Estimated: 27 mL/min — ABNORMAL LOW (ref >60)
GFR, Estimated: 26 mL/min — ABNORMAL LOW (ref >60)
Glucose, Bld: 234 mg/dL — ABNORMAL HIGH (ref 70–99)
Glucose, Bld: 193 mg/dL — ABNORMAL HIGH (ref 70–99)
Potassium: 3.1 mmol/L — ABNORMAL LOW (ref 3.5–5.1)
Potassium: 3.2 mmol/L — ABNORMAL LOW (ref 3.5–5.1)
Sodium: 132 mmol/L — ABNORMAL LOW (ref 135–145)
Sodium: 135 mmol/L (ref 135–145)

## 2020-07-06 LAB — CBC WITH DIFFERENTIAL/PLATELET
Abs Immature Granulocytes: 0.03 10*3/uL (ref 0.00–0.07)
Basophils Absolute: 0 10*3/uL (ref 0.0–0.1)
Basophils Relative: 0 %
Eosinophils Absolute: 0.1 10*3/uL (ref 0.0–0.5)
Eosinophils Relative: 1 %
HCT: 31.7 % — ABNORMAL LOW (ref 36.0–46.0)
Hemoglobin: 10.5 g/dL — ABNORMAL LOW (ref 12.0–15.0)
Immature Granulocytes: 0 %
Lymphocytes Relative: 18 %
Lymphs Abs: 1.6 10*3/uL (ref 0.7–4.0)
MCH: 35.1 pg — ABNORMAL HIGH (ref 26.0–34.0)
MCHC: 33.1 g/dL (ref 30.0–36.0)
MCV: 106 fL — ABNORMAL HIGH (ref 80.0–100.0)
Monocytes Absolute: 0.7 10*3/uL (ref 0.1–1.0)
Monocytes Relative: 8 %
Neutro Abs: 6.4 10*3/uL (ref 1.7–7.7)
Neutrophils Relative %: 73 %
Platelets: 145 10*3/uL — ABNORMAL LOW (ref 150–400)
RBC: 2.99 MIL/uL — ABNORMAL LOW (ref 3.87–5.11)
RDW: 14.2 % (ref 11.5–15.5)
WBC: 8.9 10*3/uL (ref 4.0–10.5)
nRBC: 0 % (ref 0.0–0.2)

## 2020-07-06 LAB — PHOSPHORUS: Phosphorus: 1.2 mg/dL — ABNORMAL LOW (ref 2.5–4.6)

## 2020-07-06 LAB — RENAL FUNCTION PANEL
Albumin: 3.2 g/dL — ABNORMAL LOW (ref 3.5–5.0)
Anion gap: 14 (ref 5–15)
BUN: 21 mg/dL — ABNORMAL HIGH (ref 6–20)
CO2: 15 mmol/L — ABNORMAL LOW (ref 22–32)
Calcium: 9 mg/dL (ref 8.9–10.3)
Chloride: 106 mmol/L (ref 98–111)
Creatinine, Ser: 2.51 mg/dL — ABNORMAL HIGH (ref 0.44–1.00)
GFR, Estimated: 24 mL/min — ABNORMAL LOW (ref >60)
Glucose, Bld: 190 mg/dL — ABNORMAL HIGH (ref 70–99)
Phosphorus: 1.3 mg/dL — ABNORMAL LOW (ref 2.5–4.6)
Potassium: 3.2 mmol/L — ABNORMAL LOW (ref 3.5–5.1)
Sodium: 135 mmol/L (ref 135–145)

## 2020-07-06 LAB — GLUCOSE, CAPILLARY
Glucose-Capillary: 114 mg/dL — ABNORMAL HIGH (ref 70–99)
Glucose-Capillary: 183 mg/dL — ABNORMAL HIGH (ref 70–99)
Glucose-Capillary: 189 mg/dL — ABNORMAL HIGH (ref 70–99)
Glucose-Capillary: 200 mg/dL — ABNORMAL HIGH (ref 70–99)
Glucose-Capillary: 209 mg/dL — ABNORMAL HIGH (ref 70–99)
Glucose-Capillary: 227 mg/dL — ABNORMAL HIGH (ref 70–99)
Glucose-Capillary: 239 mg/dL — ABNORMAL HIGH (ref 70–99)
Glucose-Capillary: 242 mg/dL — ABNORMAL HIGH (ref 70–99)
Glucose-Capillary: 255 mg/dL — ABNORMAL HIGH (ref 70–99)
Glucose-Capillary: 266 mg/dL — ABNORMAL HIGH (ref 70–99)
Glucose-Capillary: 281 mg/dL — ABNORMAL HIGH (ref 70–99)

## 2020-07-06 LAB — HEPATIC FUNCTION PANEL
ALT: 58 U/L — ABNORMAL HIGH (ref 0–44)
AST: 143 U/L — ABNORMAL HIGH (ref 15–41)
Albumin: 3.1 g/dL — ABNORMAL LOW (ref 3.5–5.0)
Alkaline Phosphatase: 125 U/L (ref 38–126)
Bilirubin, Direct: 0.1 mg/dL (ref 0.0–0.2)
Indirect Bilirubin: 0.4 mg/dL (ref 0.3–0.9)
Total Bilirubin: 0.5 mg/dL (ref 0.3–1.2)
Total Protein: 5.9 g/dL — ABNORMAL LOW (ref 6.5–8.1)

## 2020-07-06 LAB — CK: Total CK: 41 U/L (ref 38–234)

## 2020-07-06 LAB — MAGNESIUM
Magnesium: 1.6 mg/dL — ABNORMAL LOW (ref 1.7–2.4)
Magnesium: 1.9 mg/dL (ref 1.7–2.4)

## 2020-07-06 LAB — PROCALCITONIN: Procalcitonin: 1.79 ng/mL

## 2020-07-06 MED ORDER — POTASSIUM CHLORIDE IN NACL 20-0.9 MEQ/L-% IV SOLN
INTRAVENOUS | Status: DC
  Filled 2020-07-06 (×4): qty 1000

## 2020-07-06 MED ORDER — POTASSIUM CHLORIDE CRYS ER 20 MEQ PO TBCR
40.0000 meq | EXTENDED_RELEASE_TABLET | Freq: Once | ORAL | Status: AC
  Administered 2020-07-06: 40 meq via ORAL
  Filled 2020-07-06: qty 2

## 2020-07-06 MED ORDER — MORPHINE SULFATE (PF) 2 MG/ML IV SOLN
2.0000 mg | Freq: Once | INTRAVENOUS | Status: AC
Start: 2020-07-06 — End: 2020-07-06
  Administered 2020-07-06: 2 mg via INTRAVENOUS
  Filled 2020-07-06: qty 1

## 2020-07-06 MED ORDER — INSULIN ASPART 100 UNIT/ML ~~LOC~~ SOLN: 3.0000 [IU] | Freq: Three times a day (TID) | SUBCUTANEOUS | Status: DC

## 2020-07-06 MED ORDER — INSULIN ASPART 100 UNIT/ML ~~LOC~~ SOLN
0.0000 [IU] | Freq: Every day | SUBCUTANEOUS | Status: DC
  Administered 2020-07-06: 5 [IU] via SUBCUTANEOUS

## 2020-07-06 MED ORDER — INSULIN GLARGINE 100 UNIT/ML ~~LOC~~ SOLN
10.0000 [IU] | Freq: Two times a day (BID) | SUBCUTANEOUS | Status: DC
  Administered 2020-07-06 (×2): 10 [IU] via SUBCUTANEOUS
  Filled 2020-07-06 (×3): qty 0.1

## 2020-07-06 MED ORDER — MAGNESIUM SULFATE 2 GM/50ML IV SOLN
2.0000 g | Freq: Once | INTRAVENOUS | Status: AC
  Administered 2020-07-06: 2 g via INTRAVENOUS
  Filled 2020-07-06: qty 50

## 2020-07-06 MED ORDER — POTASSIUM PHOSPHATES 15 MMOLE/5ML IV SOLN
30.0000 mmol | Freq: Once | INTRAVENOUS | Status: AC
  Administered 2020-07-06: 30 mmol via INTRAVENOUS
  Filled 2020-07-06: qty 10

## 2020-07-06 MED ORDER — INSULIN ASPART 100 UNIT/ML ~~LOC~~ SOLN
0.0000 [IU] | Freq: Three times a day (TID) | SUBCUTANEOUS | Status: DC
  Administered 2020-07-06: 8 [IU] via SUBCUTANEOUS
  Administered 2020-07-06: 3 [IU] via SUBCUTANEOUS
  Administered 2020-07-07: 5 [IU] via SUBCUTANEOUS

## 2020-07-06 MED ORDER — LEVALBUTEROL TARTRATE 45 MCG/ACT IN AERO
2.0000 | INHALATION_SPRAY | Freq: Two times a day (BID) | RESPIRATORY_TRACT | Status: DC
  Administered 2020-07-06 – 2020-07-07 (×3): 2 via RESPIRATORY_TRACT

## 2020-07-06 NOTE — Progress Notes (Signed)
Inpatient Diabetes Program Recommendations  AACE/ADA: New Consensus Statement on Inpatient Glycemic Control (2015)  Target Ranges:  Prepandial:   less than 140 mg/dL      Peak postprandial:   less than 180 mg/dL (1-2 hours)      Critically ill patients:  140 - 180 mg/dL   Lab Results  Component Value Date   GLUCAP 189 (H) 07/06/2020   HGBA1C 10.2 (H) 07/05/2020    Review of Glycemic Control  Diabetes history: DM1 Outpatient Diabetes medications: Lantus 12 in am and 10 QHS, Humalog approx5 units TID Current orders for Inpatient glycemic control: IV insulin transitioning to Lantus 10 units BID and Humalog prn.  HgbA1C - 10.2%  Inpatient Diabetes Program Recommendations:     Reduce Novolog to 0-9 units TID with meals and 0-5 HS  Spoke with pt at bedside regarding her DKA and HgbA1C of 10.2%. Pt states she sees Endo in [place] on a regular basis. Last visit documentation was 01/29/19. Pt states she has a lot of hypoglycemia during the night, and then hyperglycemia during the day. She could not tell me exactly how much Apidra she takes, was very vague and said it depends on whether or not she vomits. Endorsed no exercise at this time. Said she likes to drink smoothies and shakes when she feels she can't eat solid food. Recommended taking 1/2 of Apidra before meal and other 1/2 after to see if this helps with gastroparesis. Could not get a straight answer on most questions. Instructed to f/u with Endo when discharged and continue with GI MD. Emphasized gastroparesis will improve with better blood sugar control.   Continue to follow.  Thank you. Lorenda Peck, RD, LDN, CDE Inpatient Diabetes Coordinator [phone]

## 2020-07-06 NOTE — Progress Notes (Signed)
PROGRESS NOTE  Anna Garcia QIW:[id number] DOB: 1981-02-07   PCP: Pcp, No  Patient is from: Home  DOA: 07/05/2020 LOS: 1  Chief complaints: Shortness of breath, abdominal pain, nausea and vomiting  Brief Narrative / Interim history: 40 year old F with PMH of DM-1, asthma, gastroparesis, neuropathy, hypertension, urinary retention with intermittent self cath presenting to ED with shortness of breath, abdominal pain, nausea and vomiting for 1 day.  Reportedly 1 prior episode of DKA when she was first diagnosed with DM-1 at the age of 60.   Patient was admitted with severe DKA, SIRS, AKI, transaminitis and hyperkalemia.  Started on IVF, IV insulin, broad-spectrum antibiotics and admitted to stepdown unit.   Subjective: Seen and examined earlier this morning.  No major events overnight of this morning.  She had mild temps overnight.  She reports improvement in her chest tightness and breathing.  She denies nausea or vomiting.  Also denies UTI symptoms but's occasionally has problems emptying her bladder completely.  Objective: Vitals:   07/06/20 0500 07/06/20 0600 07/06/20 0700 07/06/20 0800  BP: 138/82 118/71 116/72 (!) 146/84  Pulse: (!) 120 (!) 119 (!) 116 (!) 114  Resp: (!) 21 (!) 22 18 16   Temp:   99.2 F (37.3 C)   TempSrc:   Oral   SpO2: 96% 98% 97% 98%  Weight:      Height:        Intake/Output Summary (Last 24 hours) at 07/06/2020 1114 Last data filed at 07/06/2020 0602 Gross per 24 hour  Intake 3970.76 ml  Output -  Net 3970.76 ml   Filed Weights   07/05/20 0115 07/05/20 0615  Weight: 81.6 kg (S) 109.7 kg    Examination:  GENERAL: No apparent distress.  Nontoxic. HEENT: MMM.  Vision and hearing grossly intact.  NECK: Supple.  No apparent JVD.  RESP: On RA.  No IWOB.  Fair aeration bilaterally. CVS: HR in 110s. Heart sounds normal.  ABD/GI/GU: BS+. Abd soft, NTND.  MSK/EXT:  Moves extremities. No apparent deformity. No edema.  SKIN: no apparent skin lesion or  wound NEURO: Awake, alert and oriented appropriately.  No apparent focal neuro deficit. PSYCH: Calm. Normal affect.  Procedures:  None  Microbiology summarized: ERDEY-81 and influenza PCR nonreactive. MRSA PCR nonreactive. Blood cultures NGTD. Urine culture pending  Assessment & Plan: Uncontrolled DM-1 with severe DKA-high pH of 6.9 with bicab <7 and AG of 33. BHA> 8.  A1c 10.2%.  DKA resolved. Recent Labs  Lab 07/06/20 0600 07/06/20 0722 07/06/20 0821 07/06/20 0942 07/06/20 1046  GLUCAP 242* 281* 227* 183* 189*  -Transition to subcu insulin-Lantus 10 units twice daily, SSI-moderate and mealtime coverage at 3 units -Continue IV normal saline at 125 cc an hour -Carb modified diet -Transfer to progressive bed -Appreciate help by diabetic coordinator  SIRS-no clear source of infection.  Likely mediated by severe DKA.  Blood cultures NGTD. Lactic acid resolved.  Portable CXR without acute finding.  Lung exam is reassuring.  She is saturating 100%.  However, she has elevated procalcitonin and some respiratory symptoms. -Obtain 2 view chest x-ray -Continue IV ceftriaxone -Continue trending procalcitonin.  Acute kidney injury/azotemia/proteinuria: Cr 1.72 on admit, was 0.96 in 05/2019.  Unclear if this is progressive CKD or AKI since we do not have interval values for over 12 months.  UPC 3.35 concerning for nephropathy likely from uncontrolled diabetes.  She received IV vancomycin in-house.  Renal ultrasound reassuring. Recent Labs    07/05/20 0107 07/05/20 0319 07/05/20 0801 07/05/20  1001 07/05/20 1230 07/05/20 1659 07/05/20 2030 07/06/20 0156 07/06/20 0912  BUN 26* 26* 26* 28* 28* 27* 25* 24* 21*  CREATININE 1.72* 1.80* 1.83* 1.86* 1.80* 1.82* 2.12* 2.27* 2.37*  -Continue IV fluid as above -Continue trending renal function  Hypokalemia/hypomagnesemia/hypophosphatemia: Likely due to DKA. -K-Dur 40 mEq x 1 -IV potassium phosphate 30 mmol x 1 -IV magnesium sulfate 2  g x 1 -Repeat levels in the afternoon  Elevated liver enzymes/hyperbilirubinemia: LFT improved.  Hyperbilirubinemia resolved. -Continue monitoring  Asthma exacerbation: shortness of breath and chest tightness.  Portable CXR without acute finding. -Continue Dulera and Xopenex -Obtain 2 view CXR.  Chest pressure/tightness: Troponin and EKG negative.  Portable CXR without acute finding.  Likely from asthma -Management as above  Lactic acidosis: Likely from DKA, dehydration and delayed clearance in the setting of AKI.  Resolved.  Hyponatremia: Resolved. -Continue monitoring  History of urinary retention-likely due to uncontrolled diabetes. -Patient does self cath intermittently.  -Monitor  Thrombocytopenia: Likely due to acute illness. -Continue monitoring  Morbid obesity Body mass index is 41.51 kg/m.  -Encourage lifestyle change to lose weight.       DVT prophylaxis:  heparin injection 5,000 Units Start: 07/05/20 0600  Code Status: Full code Family Communication: Patient and/or RN. Available if any question.  Level of care: Progressive Status is: Inpatient  Remains inpatient appropriate because:Hemodynamically unstable, Persistent severe electrolyte disturbances, Ongoing diagnostic testing needed not appropriate for outpatient work up, IV treatments appropriate due to intensity of illness or inability to take PO and Inpatient level of care appropriate due to severity of illness   Dispo: The patient is from: Home              Anticipated d/c is to: Home              Patient currently is not medically stable to d/c.   Difficult to place patient No       Consultants:  PCCM on admit   Sch Meds:  Scheduled Meds: . Chlorhexidine Gluconate Cloth  6 each Topical Daily  . heparin  5,000 Units Subcutaneous Q8H  . insulin aspart  0-15 Units Subcutaneous TID WC  . insulin aspart  0-5 Units Subcutaneous QHS  . insulin aspart  3 Units Subcutaneous TID WC  . insulin  glargine  10 Units Subcutaneous BID  . levalbuterol  2 puff Inhalation BID  . loratadine  10 mg Oral Daily  . Milnacipran  25 mg Oral Daily  . mometasone-formoterol  2 puff Inhalation BID  . montelukast  10 mg Oral QHS  . pantoprazole  40 mg Oral Daily   Continuous Infusions: . sodium chloride 10 mL/hr at 07/06/20 0300  . 0.9 % NaCl with KCl 20 mEq / L 125 mL/hr at 07/06/20 0819  . cefTRIAXone (ROCEPHIN)  IV Stopped (07/06/20 0448)  . dextrose 5% lactated ringers Stopped (07/06/20 0829)  . famotidine (PEPCID) IV 20 mg (07/06/20 1037)  . insulin Stopped (07/06/20 1043)  . lactated ringers    . lactated ringers    . potassium PHOSPHATE IVPB (in mmol)     PRN Meds:.sodium chloride, dextrose, labetalol, levalbuterol, ondansetron (ZOFRAN) IV  Antimicrobials: Anti-infectives (From admission, onward)   Start     Dose/Rate Route Frequency Ordered Stop   07/06/20 0600  vancomycin (VANCOCIN) IVPB 1000 mg/200 mL premix  Status:  Discontinued        1,000 mg 200 mL/hr over 60 Minutes Intravenous Every 24 hours 07/05/20 0551 07/05/20 1231  07/06/20 0400  cefTRIAXone (ROCEPHIN) 2 g in sodium chloride 0.9 % 100 mL IVPB        2 g 200 mL/hr over 30 Minutes Intravenous Every 24 hours 07/05/20 0535     07/05/20 0345  vancomycin (VANCOREADY) IVPB 1750 mg/350 mL        1,750 mg 175 mL/hr over 120 Minutes Intravenous  Once 07/05/20 0339 07/05/20 0743   07/05/20 0345  cefTRIAXone (ROCEPHIN) 2 g in sodium chloride 0.9 % 100 mL IVPB        2 g 200 mL/hr over 30 Minutes Intravenous  Once 07/05/20 0339 07/05/20 0543   07/05/20 0330  cefTRIAXone (ROCEPHIN) 1 g in sodium chloride 0.9 % 100 mL IVPB  Status:  Discontinued        1 g 200 mL/hr over 30 Minutes Intravenous  Once 07/05/20 0330 07/05/20 0339       I have personally reviewed the following labs and images: CBC: Recent Labs  Lab 07/05/20 0107 07/05/20 1230 07/06/20 0156  WBC 17.0* 11.3* 8.9  NEUTROABS 11.0* 8.9* 6.4  HGB 13.0 10.8*  10.5*  HCT 41.9 32.2* 31.7*  MCV 113.2* 104.5* 106.0*  PLT 309 176 145*   BMP &GFR Recent Labs  Lab 07/05/20 1230 07/05/20 1659 07/05/20 2030 07/06/20 0156 07/06/20 0912  NA 133* 132* 132* 132* 135  K 3.8 3.4* 3.2* 3.1* 3.2*  CL 101 101 100 104 106  CO2 17* 18* 18* 18* 16*  GLUCOSE 198* 194* 198* 234* 193*  BUN 28* 27* 25* 24* 21*  CREATININE 1.80* 1.82* 2.12* 2.27* 2.37*  CALCIUM 9.3 9.1 9.1 9.1 9.0  MG  --  1.8  --  1.6*  --   PHOS  --   --   --  1.2*  --    Estimated Creatinine Clearance: 38.6 mL/min (A) (by C-G formula based on SCr of 2.37 mg/dL (H)). Liver & Pancreas: Recent Labs  Lab 07/05/20 0107 07/05/20 1230 07/06/20 0156  AST 78* 133* 143*  ALT 64* 55* 58*  ALKPHOS 182* 135* 125  BILITOT 2.1* 1.1 0.5  PROT 8.5* 6.8 5.9*  ALBUMIN 4.7 3.6 3.1*   Recent Labs  Lab 07/05/20 0330  LIPASE 27   No results for input(s): AMMONIA in the last 168 hours. Diabetic: Recent Labs    07/05/20 0801  HGBA1C 10.2*   Recent Labs  Lab 07/06/20 0600 07/06/20 0722 07/06/20 0821 07/06/20 0942 07/06/20 1046  GLUCAP 242* 281* 227* 183* 189*   Cardiac Enzymes: Recent Labs  Lab 07/06/20 0156  CKTOTAL 41   No results for input(s): PROBNP in the last 8760 hours. Coagulation Profile: Recent Labs  Lab 07/05/20 0801  INR 1.2   Thyroid Function Tests: No results for input(s): TSH, T4TOTAL, FREET4, T3FREE, THYROIDAB in the last 72 hours. Lipid Profile: No results for input(s): CHOL, HDL, LDLCALC, TRIG, CHOLHDL, LDLDIRECT in the last 72 hours. Anemia Panel: No results for input(s): VITAMINB12, FOLATE, FERRITIN, TIBC, IRON, RETICCTPCT in the last 72 hours. Urine analysis:    Component Value Date/Time   COLORURINE YELLOW 07/05/2020 1929   APPEARANCEUR HAZY (A) 07/05/2020 1929   LABSPEC 1.007 07/05/2020 1929   PHURINE 5.0 07/05/2020 1929   GLUCOSEU >=500 (A) 07/05/2020 1929   HGBUR LARGE (A) 07/05/2020 1929   BILIRUBINUR NEGATIVE 07/05/2020 1929   KETONESUR 20  (A) 07/05/2020 1929   PROTEINUR 100 (A) 07/05/2020 1929   UROBILINOGEN 0.2 04/11/2011 1437   NITRITE NEGATIVE 07/05/2020 1929   LEUKOCYTESUR MODERATE (A)  07/05/2020 1929   Sepsis Labs: Invalid input(s): PROCALCITONIN, [place]  Microbiology: Recent Results (from the past 240 hour(s))  Resp Panel by RT-PCR (Flu A&B, Covid) Nasopharyngeal Swab     Status: None   Collection Time: 07/05/20  3:05 AM   Specimen: Nasopharyngeal Swab; Nasopharyngeal(NP) swabs in vial transport medium  Result Value Ref Range Status   SARS Coronavirus 2 by RT PCR NEGATIVE NEGATIVE Final    Comment: (NOTE) SARS-CoV-2 target nucleic acids are NOT DETECTED.  The SARS-CoV-2 RNA is generally detectable in upper respiratory specimens during the acute phase of infection. The lowest concentration of SARS-CoV-2 viral copies this assay can detect is 138 copies/mL. A negative result does not preclude SARS-Cov-2 infection and should not be used as the sole basis for treatment or other patient management decisions. A negative result may occur with  improper specimen collection/handling, submission of specimen other than nasopharyngeal swab, presence of viral mutation(s) within the areas targeted by this assay, and inadequate number of viral copies(<138 copies/mL). A negative result must be combined with clinical observations, patient history, and epidemiological information. The expected result is Negative.  Fact Sheet for Patients:  EntrepreneurPulse.com.au  Fact Sheet for Healthcare Providers:  IncredibleEmployment.be  This test is no t yet approved or cleared by the [place] FDA and  has been authorized for detection and/or diagnosis of SARS-CoV-2 by FDA under an Emergency Use Authorization (EUA). This EUA will remain  in effect (meaning this test can be used) for the duration of the COVID-19 declaration under Section 564(b)(1) of the Act, 21 U.S.C.section  360bbb-3(b)(1), unless the authorization is terminated  or revoked sooner.       Influenza A by PCR NEGATIVE NEGATIVE Final   Influenza B by PCR NEGATIVE NEGATIVE Final    Comment: (NOTE) The Xpert Xpress SARS-CoV-2/FLU/RSV plus assay is intended as an aid in the diagnosis of influenza from Nasopharyngeal swab specimens and should not be used as a sole basis for treatment. Nasal washings and aspirates are unacceptable for Xpert Xpress SARS-CoV-2/FLU/RSV testing.  Fact Sheet for Patients: EntrepreneurPulse.com.au  Fact Sheet for Healthcare Providers: IncredibleEmployment.be  This test is not yet approved or cleared by the [place] FDA and has been authorized for detection and/or diagnosis of SARS-CoV-2 by FDA under an Emergency Use Authorization (EUA). This EUA will remain in effect (meaning this test can be used) for the duration of the COVID-19 declaration under Section 564(b)(1) of the Act, 21 U.S.C. section 360bbb-3(b)(1), unless the authorization is terminated or revoked.  Performed at [facility], [place] [address], [place], [place] [address]   Blood culture (routine x 2)     Status: None (Preliminary result)   Collection Time: 07/05/20  3:30 AM   Specimen: BLOOD  Result Value Ref Range Status   Specimen Description   Final    BLOOD RIGHT ANTECUBITAL Performed at [place] [address], [place], [place] [address]    Special Requests   Final    BOTTLES DRAWN AEROBIC AND ANAEROBIC Blood Culture adequate volume Performed at [place] [address], [place], [place] [address]    Culture   Final    NO GROWTH 1 DAY Performed at [facility], [place] [address], [place], [place] [address]    Report Status PENDING  Incomplete  Blood culture (routine x 2)     Status: None (Preliminary result)   Collection Time: 07/05/20  3:35 AM   Specimen: BLOOD  Result Value  Ref  Range Status   Specimen Description   Final    BLOOD LEFT ANTECUBITAL Performed at [place] [address] [place], [place] [address]    Special Requests   Final    BOTTLES DRAWN AEROBIC AND ANAEROBIC Blood Culture results may not be optimal due to an inadequate volume of blood received in culture bottles Performed at [place] [address], [place], [place] [address]    Culture   Final    NO GROWTH 1 DAY Performed at [facility], [place] [address] [place], [place] [address]    Report Status PENDING  Incomplete  MRSA PCR Screening     Status: None   Collection Time: 07/05/20  6:20 AM   Specimen: Nasopharyngeal  Result Value Ref Range Status   MRSA by PCR NEGATIVE NEGATIVE Final    Comment:        The GeneXpert MRSA Assay (FDA approved for NASAL specimens only), is one component of a comprehensive MRSA colonization surveillance program. It is not intended to diagnose MRSA infection nor to guide or monitor treatment for MRSA infections. Performed at [facility], [place] [address], [place], [place] [address]     Radiology Studies: US RENAL  Result Date: 07/06/2020 CLINICAL DATA:  Acute kidney injury EXAM: RENAL / URINARY TRACT ULTRASOUND COMPLETE COMPARISON:  None. FINDINGS: Right Kidney: Renal measurements: 11.6 x 5.7 x 5.8 cm = volume: 202 mL. Echogenicity within normal limits. No mass or hydronephrosis visualized. Left Kidney: Renal measurements: 11.7 x 6.2 x 5.5 cm = volume: 210 mL. Echogenicity within normal limits. No mass or hydronephrosis visualized. Bladder: Appears normal for degree of bladder distention. Other: None. IMPRESSION: No acute findings.  No hydronephrosis. Electronically Signed   By: Rolm Baptise M.D.   On: 07/06/2020 08:30    Additional 40 minutes with more than 50% spent in reviewing records, counseling patient/family and coordinating care.   Taye T. Lydia  If  7PM-7AM, please contact night-coverage [email or web address] 07/06/2020, 11:14 AM

## 2020-07-07 LAB — RENAL FUNCTION PANEL
Albumin: 3.3 g/dL — ABNORMAL LOW (ref 3.5–5.0)
Anion gap: 11 (ref 5–15)
BUN: 17 mg/dL (ref 6–20)
CO2: 16 mmol/L — ABNORMAL LOW (ref 22–32)
Calcium: 9 mg/dL (ref 8.9–10.3)
Chloride: 112 mmol/L — ABNORMAL HIGH (ref 98–111)
Creatinine, Ser: 2.45 mg/dL — ABNORMAL HIGH (ref 0.44–1.00)
GFR, Estimated: 25 mL/min — ABNORMAL LOW (ref >60)
Glucose, Bld: 190 mg/dL — ABNORMAL HIGH (ref 70–99)
Phosphorus: 2.8 mg/dL (ref 2.5–4.6)
Potassium: 3.9 mmol/L (ref 3.5–5.1)
Sodium: 139 mmol/L (ref 135–145)

## 2020-07-07 LAB — RETICULOCYTES
Immature Retic Fract: 12.1 % (ref 2.3–15.9)
RBC.: 2.83 MIL/uL — ABNORMAL LOW (ref 3.87–5.11)
Retic Count, Absolute: 31.4 10*3/uL (ref 19.0–186.0)
Retic Ct Pct: 1.1 % (ref 0.4–3.1)

## 2020-07-07 LAB — IRON AND TIBC
Iron: 90 ug/dL (ref 28–170)
Saturation Ratios: 27 % (ref 10.4–31.8)
TIBC: 328 ug/dL (ref 250–450)
UIBC: 238 ug/dL

## 2020-07-07 LAB — CBC
HCT: 30.7 % — ABNORMAL LOW (ref 36.0–46.0)
Hemoglobin: 10.1 g/dL — ABNORMAL LOW (ref 12.0–15.0)
MCH: 34.9 pg — ABNORMAL HIGH (ref 26.0–34.0)
MCHC: 32.9 g/dL (ref 30.0–36.0)
MCV: 106.2 fL — ABNORMAL HIGH (ref 80.0–100.0)
Platelets: 127 10*3/uL — ABNORMAL LOW (ref 150–400)
RBC: 2.89 MIL/uL — ABNORMAL LOW (ref 3.87–5.11)
RDW: 14.7 % (ref 11.5–15.5)
WBC: 6.3 10*3/uL (ref 4.0–10.5)
nRBC: 0 % (ref 0.0–0.2)

## 2020-07-07 LAB — MAGNESIUM: Magnesium: 2.2 mg/dL (ref 1.7–2.4)

## 2020-07-07 LAB — URINE CULTURE: Culture: NO GROWTH

## 2020-07-07 LAB — VITAMIN B12: Vitamin B-12: 295 pg/mL (ref 180–914)

## 2020-07-07 LAB — FERRITIN: Ferritin: 94 ng/mL (ref 11–307)

## 2020-07-07 LAB — FOLATE: Folate: 10 ng/mL (ref >5.9)

## 2020-07-07 LAB — GLUCOSE, CAPILLARY: Glucose-Capillary: 215 mg/dL — ABNORMAL HIGH (ref 70–99)

## 2020-07-07 LAB — PROCALCITONIN: Procalcitonin: 0.81 ng/mL

## 2020-07-07 MED ORDER — AMOXICILLIN-POT CLAVULANATE 500-125 MG PO TABS: 1.0000 | ORAL_TABLET | Freq: Two times a day (BID) | ORAL | 0 refills | Status: DC

## 2020-07-07 MED ORDER — INSULIN ASPART 100 UNIT/ML ~~LOC~~ SOLN: SUBCUTANEOUS | 2 refills | Status: DC

## 2020-07-07 MED ORDER — INSULIN GLARGINE 100 UNIT/ML ~~LOC~~ SOLN
13.0000 [IU] | Freq: Two times a day (BID) | SUBCUTANEOUS | Status: DC
  Filled 2020-07-07: qty 0.13

## 2020-07-07 MED ORDER — MOMETASONE FURO-FORMOTEROL FUM 100-5 MCG/ACT IN AERO: 2.0000 | INHALATION_SPRAY | Freq: Two times a day (BID) | RESPIRATORY_TRACT | 1 refills | Status: AC

## 2020-07-07 MED ORDER — INSULIN ASPART 100 UNIT/ML ~~LOC~~ SOLN
4.0000 [IU] | Freq: Three times a day (TID) | SUBCUTANEOUS | Status: DC
  Administered 2020-07-07: 4 [IU] via SUBCUTANEOUS

## 2020-07-07 MED ORDER — LOSARTAN POTASSIUM 25 MG PO TABS: 25.0000 mg | ORAL_TABLET | Freq: Every day | ORAL | Status: DC

## 2020-07-07 MED ORDER — LANTUS SOLOSTAR 100 UNIT/ML ~~LOC~~ SOPN
12.0000 [IU] | PEN_INJECTOR | Freq: Two times a day (BID) | SUBCUTANEOUS | 11 refills | Status: DC
Start: 2020-07-07 — End: 2021-05-28

## 2020-07-07 NOTE — Progress Notes (Addendum)
Pt declined AM medications, saying she wants to make sure she takes her home meds at home and doesn't double up on medications. Pt d/c to home. AVS and reasons to return to ED or seek attention from PCP reviewed with pt. Pt confirmed understanding using teachback method. PIV X 2 removed without complication. Pt escorted off of unit via wheelchair by PCT Hunter to care and care of sister in law at main entrance.

## 2020-07-07 NOTE — Discharge Summary (Signed)
Physician Discharge Summary  Anna Garcia MPN:[id number] DOB: 08-10-1980 DOA: 07/05/2020  PCP: Pcp, No  Admit date: 07/05/2020 Discharge date: 07/07/2020  Admitted From: Home Disposition: Home  Recommendations for Outpatient Follow-up:  1. Follow ups as below. 2. Please obtain CBC/CMP/Mag at follow up 3. Please follow up on the following pending results: None  Home Health: None Equipment/Devices: None  Discharge Condition: Stable CODE STATUS: Full code   Hospital Course: 40 year old F with PMH of DM-1, asthma, gastroparesis, neuropathy, hypertension, urinary retention with intermittent self cath presenting to ED with shortness of breath, abdominal pain, nausea and vomiting for 1 day.  Reportedly 1 prior episode of DKA when she was first diagnosed with DM-1 at the age of 80.   Patient was admitted with severe DKA, SIRS, AKI, transaminitis and hyperkalemia.  Started on IVF, IV insulin, broad-spectrum antibiotics and admitted to stepdown unit.   Patient's DKA and hyperkalemia resolved.  She was transitioned to subcu insulin and discharged on new regimen as below..    In regards to AKI, serum creatinine continue to rise but at a slower rate.  She chose to go home and follow-up with her nephrologist and endocrinologist.  She also has nephrotic range proteinuria with UPC of 3.35.   See individual problem list below for more on hospital course.  Discharge Diagnoses:  Uncontrolled DM-1 with severe DKA-high pH of 6.9 with bicab <7 and AG of 33. BHA> 8.  A1c 10.2%.  Suspect some element of noncompliance with medication as she admits inconsistent use of short acting insulin. DKA resolved. Recent Labs  Lab 07/06/20 0942 07/06/20 1046 07/06/20 1702 07/06/20 2102 07/07/20 0717  GLUCAP 183* 189* 266* 239* 215*  -Increased home Lantus from 10 to 12 units twice daily -NovoLog 3 units 3 times a day with meals -Moderate sliding scale insulin -Outpatient follow-up with her endocrinologist  early next week  SIRS-no clear source of infection. Discharge on p.o. Augmentin to complete CAP coverage although there was no radiologic evidence of pneumonia but elevated procalcitonin  Acute kidney injury/azotemia/proteinuria: Cr 1.72 on admit, was 0.96 in 05/2019.  Unclear if this is progressive CKD or AKI since we do not have interval values for over 12 months.  UPC 3.35.  Renal ultrasound without significant finding.  Creatinine trended up but at slow rate likely from vancomycin she received.  -Recheck renal function in 1 week -Patient to follow-up with PCP/endocrinologist and her nephrologist -Consider ACE inhibitor's or ARB's for renal protection if AKI improves.  Hypokalemia/hypomagnesemia/hypophosphatemia/hyponatremia:  Resolved. -Recheck at follow-up  Non-anion gap metabolic acidosis: Likely due to renal failure. -Recheck at follow-up  Elevated liver enzymes/hyperbilirubinemia:  Hyperbilirubinemia resolved. -Recheck CMP at follow-up.  Asthma exacerbation: shortness of breath and chest tightness.   Resolved. -Discharged on Dulera and ProAir  Lactic acidosis: Resolved.  History of urinary retention-likely due to uncontrolled diabetes. -Patient does self cath intermittently.   Thrombocytopenia: Likely due to acute illness. -Recheck CBC at follow-up  Morbid obesity Body mass index is 41.51 kg/m.  -Encourage lifestyle change to lose weight.          Discharge Exam: Vitals:   07/07/20 0834 07/07/20 1043  BP:  123/90  Pulse:  (!) 107  Resp:  16  Temp:  98.4 F (36.9 C)  SpO2: 100% 98%    GENERAL: No apparent distress.  Nontoxic. HEENT: MMM.  Vision and hearing grossly intact.  NECK: Supple.  No apparent JVD.  RESP:  No IWOB.  Fair aeration bilaterally. CVS:  RRR.  Heart sounds normal.  ABD/GI/GU: Bowel sounds present. Soft. Non tender.  MSK/EXT:  Moves extremities. No apparent deformity. No edema.  SKIN: no apparent skin lesion or wound NEURO:  Awake, alert and oriented appropriately.  No apparent focal neuro deficit. PSYCH: Calm. Normal affect.   Discharge Instructions  Discharge Instructions    Call MD for:  difficulty breathing, headache or visual disturbances   Complete by: As directed    Call MD for:  extreme fatigue   Complete by: As directed    Call MD for:  persistant dizziness or light-headedness   Complete by: As directed    Call MD for:  persistant nausea and vomiting   Complete by: As directed    Call MD for:  temperature >100.4   Complete by: As directed    Diet - low sodium heart healthy   Complete by: As directed    Diet Carb Modified   Complete by: As directed    Discharge instructions   Complete by: As directed    It has been a pleasure taking care of you!  You were hospitalized due to diabetic ketoacidosis, asthma exacerbation with possible lung infection and kidney failure. Your symptoms improved with the treatments.  We are discharging you more antibiotics to complete treatment course for infection.  We have also made adjustment to your insulin.  Please review your new medication list and the directions on your medications before you take them.  Follow-up with your primary care doctor or endocrinologist early next week to have your kidney numbers rechecked.  Meanwhile, we recommend not taking your losartan.    Take care,   Increase activity slowly   Complete by: As directed      Allergies as of 07/07/2020   No Known Allergies     Medication List    STOP taking these medications   amitriptyline 25 MG tablet Commonly known as: ELAVIL   Apidra SoloStar 100 UNIT/ML Solostar Pen Generic drug: insulin glulisine   bethanechol 10 MG tablet Commonly known as: URECHOLINE   diphenhydrAMINE 25 MG tablet Commonly known as: BENADRYL   Trulance 3 MG Tabs Generic drug: Plecanatide   Xifaxan 550 MG Tabs tablet Generic drug: rifaximin     TAKE these medications   albuterol 108 (90 Base) MCG/ACT  inhaler Commonly known as: VENTOLIN HFA Inhale 2 puffs into the lungs every 6 (six) hours as needed for wheezing or shortness of breath.   amoxicillin-clavulanate 500-125 MG tablet Commonly known as: Augmentin Take 1 tablet (500 mg total) by mouth in the morning and at bedtime.   cetirizine 10 MG tablet Commonly known as: ZYRTEC Take 10 mg by mouth at bedtime.   Dexilant 60 MG capsule Generic drug: dexlansoprazole Take 1 capsule by mouth daily.   famotidine 40 MG tablet Commonly known as: PEPCID Take 40 mg by mouth 2 (two) times daily.   insulin aspart 100 UNIT/ML injection Commonly known as: novoLOG Inject 3 units three times a day with meals if you eat over 50% of your meals. In addition, use sliding scale as follows: CBG 70 - 120: 0 units CBG 121 - 150: 2 units CBG 151 - 200: 3 units CBG 201 - 250: 5 units CBG 251 - 300: 8 units CBG 301 - 350: 11 units CBG 351 - 400: 15 units CBG > 400: call MD and obtain STAT lab verification   Lantus SoloStar 100 UNIT/ML Solostar Pen Generic drug: insulin glargine Inject 12 Units into the skin in the morning  and at bedtime. What changed:   how much to take  additional instructions   losartan 25 MG tablet Commonly known as: COZAAR Take 1 tablet (25 mg total) by mouth daily. Start taking on: July 14, 2020 What changed: These instructions start on July 14, 2020. If you are unsure what to do until then, ask your doctor or other care provider.   Milnacipran HCl 25 MG Tabs Take by mouth. Take 1 tablet (25 mg total) by mouth once daily for 14 days , then increase to 2 tablets twice daily for 16 days   mometasone-formoterol 100-5 MCG/ACT Aero Commonly known as: DULERA Inhale 2 puffs into the lungs 2 (two) times daily.   montelukast 10 MG tablet Commonly known as: SINGULAIR Take 10 mg by mouth at bedtime.   ondansetron 4 MG disintegrating tablet Commonly known as: Zofran ODT Take 1 tablet (4 mg total) by mouth every 8 (eight) hours  as needed for nausea or vomiting.   rosuvastatin 20 MG tablet Commonly known as: CRESTOR Take 20 mg by mouth daily.       Consultations:  None  Procedures/Studies:   DG Chest 2 View  Result Date: 07/06/2020 CLINICAL DATA:  Shortness of breath. EXAM: CHEST - 2 VIEW COMPARISON:  July 05, 2020. FINDINGS: The heart size and mediastinal contours are within normal limits. No pneumothorax or significant pleural effusion is noted. Mild bibasilar opacities are noted, concerning for subsegmental atelectasis or infiltrates, left greater than right. The visualized skeletal structures are unremarkable. IMPRESSION: Mild bibasilar subsegmental atelectasis or infiltrates, left greater than right. Electronically Signed   By: Marijo Conception M.D.   On: 07/06/2020 14:51   US RENAL  Result Date: 07/06/2020 CLINICAL DATA:  Acute kidney injury EXAM: RENAL / URINARY TRACT ULTRASOUND COMPLETE COMPARISON:  None. FINDINGS: Right Kidney: Renal measurements: 11.6 x 5.7 x 5.8 cm = volume: 202 mL. Echogenicity within normal limits. No mass or hydronephrosis visualized. Left Kidney: Renal measurements: 11.7 x 6.2 x 5.5 cm = volume: 210 mL. Echogenicity within normal limits. No mass or hydronephrosis visualized. Bladder: Appears normal for degree of bladder distention. Other: None. IMPRESSION: No acute findings.  No hydronephrosis. Electronically Signed   By: Rolm Baptise M.D.   On: 07/06/2020 08:30   DG Chest Port 1 View  Result Date: 07/05/2020 CLINICAL DATA:  Asthma, short of breath EXAM: PORTABLE CHEST 1 VIEW COMPARISON:  None. FINDINGS: Frontal and lateral views of the chest demonstrate an unremarkable cardiac silhouette. No acute airspace disease, effusion, or pneumothorax. Areas of sclerosis throughout the visualized humeral diaphysis could reflect prior bone infarct. No acute displaced fracture. IMPRESSION: 1. No acute intrathoracic process. 2. Intramedullary sclerosis within the left humeral diaphysis,  nonspecific but possibly related to prior bone infarct. Electronically Signed   By: Randa Ngo M.D.   On: 07/05/2020 02:52        The results of significant diagnostics from this hospitalization (including imaging, microbiology, ancillary and laboratory) are listed below for reference.     Microbiology: Recent Results (from the past 240 hour(s))  Resp Panel by RT-PCR (Flu A&B, Covid) Nasopharyngeal Swab     Status: None   Collection Time: 07/05/20  3:05 AM   Specimen: Nasopharyngeal Swab; Nasopharyngeal(NP) swabs in vial transport medium  Result Value Ref Range Status   SARS Coronavirus 2 by RT PCR NEGATIVE NEGATIVE Final    Comment: (NOTE) SARS-CoV-2 target nucleic acids are NOT DETECTED.  The SARS-CoV-2 RNA is generally detectable in upper respiratory specimens  during the acute phase of infection. The lowest concentration of SARS-CoV-2 viral copies this assay can detect is 138 copies/mL. A negative result does not preclude SARS-Cov-2 infection and should not be used as the sole basis for treatment or other patient management decisions. A negative result may occur with  improper specimen collection/handling, submission of specimen other than nasopharyngeal swab, presence of viral mutation(s) within the areas targeted by this assay, and inadequate number of viral copies(<138 copies/mL). A negative result must be combined with clinical observations, patient history, and epidemiological information. The expected result is Negative.  Fact Sheet for Patients:  EntrepreneurPulse.com.au  Fact Sheet for Healthcare Providers:  IncredibleEmployment.be  This test is no t yet approved or cleared by the [place] FDA and  has been authorized for detection and/or diagnosis of SARS-CoV-2 by FDA under an Emergency Use Authorization (EUA). This EUA will remain  in effect (meaning this test can be used) for the duration of the COVID-19 declaration  under Section 564(b)(1) of the Act, 21 U.S.C.section 360bbb-3(b)(1), unless the authorization is terminated  or revoked sooner.       Influenza A by PCR NEGATIVE NEGATIVE Final   Influenza B by PCR NEGATIVE NEGATIVE Final    Comment: (NOTE) The Xpert Xpress SARS-CoV-2/FLU/RSV plus assay is intended as an aid in the diagnosis of influenza from Nasopharyngeal swab specimens and should not be used as a sole basis for treatment. Nasal washings and aspirates are unacceptable for Xpert Xpress SARS-CoV-2/FLU/RSV testing.  Fact Sheet for Patients: EntrepreneurPulse.com.au  Fact Sheet for Healthcare Providers: IncredibleEmployment.be  This test is not yet approved or cleared by the [place] FDA and has been authorized for detection and/or diagnosis of SARS-CoV-2 by FDA under an Emergency Use Authorization (EUA). This EUA will remain in effect (meaning this test can be used) for the duration of the COVID-19 declaration under Section 564(b)(1) of the Act, 21 U.S.C. section 360bbb-3(b)(1), unless the authorization is terminated or revoked.  Performed at [facility], [place] [address], [place], [place] [address]   Blood culture (routine x 2)     Status: None (Preliminary result)   Collection Time: 07/05/20  3:30 AM   Specimen: BLOOD  Result Value Ref Range Status   Specimen Description   Final    BLOOD RIGHT ANTECUBITAL Performed at [place] [address], [place], [place] [address]    Special Requests   Final    BOTTLES DRAWN AEROBIC AND ANAEROBIC Blood Culture adequate volume Performed at [facility] [address] [place], [place] [address]    Culture   Final    NO GROWTH 2 DAYS Performed at [facility] [address], [place], [place] [address]    Report Status PENDING  Incomplete  Blood culture (routine x 2)     Status: None (Preliminary result)   Collection Time:  07/05/20  3:35 AM   Specimen: BLOOD  Result Value Ref Range Status   Specimen Description   Final    BLOOD LEFT ANTECUBITAL Performed at [facility] [address] [place], [place] [address]    Special Requests   Final    BOTTLES DRAWN AEROBIC AND ANAEROBIC Blood Culture results may not be optimal due to an inadequate volume of blood received in culture bottles Performed at [facility] [address] [place], [place] [address]    Culture   Final    NO GROWTH 2 DAYS Performed at [facility], [place] [address] [place], [place] [address]    Report Status PENDING  Incomplete  MRSA PCR Screening     Status: None   Collection Time: 07/05/20  6:20 AM   Specimen: Nasopharyngeal  Result Value Ref Range Status   MRSA by PCR NEGATIVE NEGATIVE Final    Comment:        The GeneXpert MRSA Assay (FDA approved for NASAL specimens only), is one component of a comprehensive MRSA colonization surveillance program. It is not intended to diagnose MRSA infection nor to guide or monitor treatment for MRSA infections. Performed at [facility], [place] [address], [place], [place] [address]   Urine Culture     Status: None   Collection Time: 07/05/20  7:29 PM   Specimen: Urine, Clean Catch  Result Value Ref Range Status   Specimen Description   Final    URINE, CLEAN CATCH Performed at [facility], [place] [address] [place], [place] [address]    Special Requests   Final    NONE Performed at [facility], [place] [address] [place], [place] [address]    Culture   Final    NO GROWTH Performed at [facility], [place] [address] [place], [place] [address]    Report Status 07/07/2020 FINAL  Final     Labs:  CBC: Recent Labs  Lab 07/05/20 0107 07/05/20 1230 07/06/20 0156 07/07/20 0513  WBC 17.0* 11.3* 8.9 6.3  NEUTROABS 11.0* 8.9* 6.4  --   HGB 13.0 10.8* 10.5* 10.1*  HCT 41.9 32.2*  31.7* 30.7*  MCV 113.2* 104.5* 106.0* 106.2*  PLT 309 176 145* 127*   BMP &GFR Recent Labs  Lab 07/05/20 1659 07/05/20 2030 07/06/20 0156 07/06/20 0912 07/07/20 0513  NA 132* 132* 132* 135  135 139  K 3.4* 3.2* 3.1* 3.2*  3.2* 3.9  CL 101 100 104 106  106 112*  CO2 18* 18* 18* 15*  16* 16*  GLUCOSE 194* 198* 234* 190*  193* 190*  BUN 27* 25* 24* 21*  21* 17  CREATININE 1.82* 2.12* 2.27* 2.51*  2.37* 2.45*  CALCIUM 9.1 9.1 9.1 9.0  9.0 9.0  MG 1.8  --  1.6* 1.9 2.2  PHOS  --   --  1.2* 1.3* 2.8   Estimated Creatinine Clearance: 37.3 mL/min (A) (by C-G formula based on SCr of 2.45 mg/dL (H)). Liver & Pancreas: Recent Labs  Lab 07/05/20 0107 07/05/20 1230 07/06/20 0156 07/06/20 0912 07/07/20 0513  AST 78* 133* 143*  --   --   ALT 64* 55* 58*  --   --   ALKPHOS 182* 135* 125  --   --   BILITOT 2.1* 1.1 0.5  --   --   PROT 8.5* 6.8 5.9*  --   --   ALBUMIN 4.7 3.6 3.1* 3.2* 3.3*   Recent Labs  Lab 07/05/20 0330  LIPASE 27   No results for input(s): AMMONIA in the last 168 hours. Diabetic: Recent Labs    07/05/20 0801  HGBA1C 10.2*   Recent Labs  Lab 07/06/20 0942 07/06/20 1046 07/06/20 1702 07/06/20 2102 07/07/20 0717  GLUCAP 183* 189* 266* 239* 215*   Cardiac Enzymes: Recent Labs  Lab 07/06/20 0156  CKTOTAL 41   No results for input(s): PROBNP in the last 8760 hours. Coagulation Profile: Recent Labs  Lab 07/05/20 0801  INR 1.2   Thyroid Function Tests: No results for input(s): TSH, T4TOTAL, FREET4,  T3FREE, THYROIDAB in the last 72 hours. Lipid Profile: No results for input(s): CHOL, HDL, LDLCALC, TRIG, CHOLHDL, LDLDIRECT in the last 72 hours. Anemia Panel: Recent Labs    07/07/20 0513  VITAMINB12 295  FOLATE 10.0  FERRITIN 94  TIBC 328  IRON 90  RETICCTPCT 1.1   Urine analysis:    Component Value Date/Time   COLORURINE YELLOW 07/05/2020 1929   APPEARANCEUR HAZY (A) 07/05/2020 1929   LABSPEC 1.007 07/05/2020 1929    PHURINE 5.0 07/05/2020 1929   GLUCOSEU >=500 (A) 07/05/2020 1929   HGBUR LARGE (A) 07/05/2020 1929   BILIRUBINUR NEGATIVE 07/05/2020 1929   KETONESUR 20 (A) 07/05/2020 1929   PROTEINUR 100 (A) 07/05/2020 1929   UROBILINOGEN 0.2 04/11/2011 1437   NITRITE NEGATIVE 07/05/2020 1929   LEUKOCYTESUR MODERATE (A) 07/05/2020 1929   Sepsis Labs: Invalid input(s): PROCALCITONIN, LACTICIDVEN   Time coordinating discharge: 45 minutes  SIGNED:  Mercy Riding, MD  [facility] 07/07/2020, 9:20 PM  If 7PM-7AM, please contact night-coverage [email or web address]

## 2020-07-07 NOTE — Care Management Important Message (Signed)
Important Message  Patient Details IM Letter given to the Patient Name: KACY HEGNA MRN: [id number] Date of Birth: [birth year]   Medicare Important Message Given:  Yes     Kerin Salen 07/07/2020, 10:54 AM

## 2020-07-10 LAB — CULTURE, BLOOD (ROUTINE X 2)
Culture: NO GROWTH
Culture: NO GROWTH
Special Requests: ADEQUATE

## 2020-10-12 ENCOUNTER — Other Ambulatory Visit: Payer: Self-pay

## 2020-10-12 ENCOUNTER — Observation Stay (HOSPITAL_COMMUNITY)
Admission: EM | Admit: 2020-10-12 | Discharge: 2020-10-13 | Disposition: A | Discharge status: Home / Self Care | Payer: Medicare Other | Attending: Emergency Medicine | Admitting: Emergency Medicine

## 2020-10-12 ENCOUNTER — Encounter (HOSPITAL_COMMUNITY): Payer: Self-pay | Admitting: Internal Medicine

## 2020-10-12 DIAGNOSIS — E1010 Type 1 diabetes mellitus with ketoacidosis without coma: Secondary | ICD-10-CM | POA: Diagnosis not present

## 2020-10-12 DIAGNOSIS — Z20822 Contact with and (suspected) exposure to covid-19: Secondary | ICD-10-CM | POA: Insufficient documentation

## 2020-10-12 DIAGNOSIS — Z794 Long term (current) use of insulin: Secondary | ICD-10-CM | POA: Diagnosis not present

## 2020-10-12 DIAGNOSIS — I129 Hypertensive chronic kidney disease with stage 1 through stage 4 chronic kidney disease, or unspecified chronic kidney disease: Secondary | ICD-10-CM | POA: Insufficient documentation

## 2020-10-12 DIAGNOSIS — Z79899 Other long term (current) drug therapy: Secondary | ICD-10-CM | POA: Insufficient documentation

## 2020-10-12 DIAGNOSIS — I10 Essential (primary) hypertension: Secondary | ICD-10-CM | POA: Diagnosis present

## 2020-10-12 DIAGNOSIS — F17210 Nicotine dependence, cigarettes, uncomplicated: Secondary | ICD-10-CM | POA: Insufficient documentation

## 2020-10-12 DIAGNOSIS — R112 Nausea with vomiting, unspecified: Secondary | ICD-10-CM | POA: Diagnosis present

## 2020-10-12 DIAGNOSIS — M797 Fibromyalgia: Secondary | ICD-10-CM | POA: Diagnosis present

## 2020-10-12 DIAGNOSIS — J45909 Unspecified asthma, uncomplicated: Secondary | ICD-10-CM | POA: Insufficient documentation

## 2020-10-12 DIAGNOSIS — N183 Chronic kidney disease, stage 3 unspecified: Secondary | ICD-10-CM | POA: Diagnosis not present

## 2020-10-12 DIAGNOSIS — N1831 Chronic kidney disease, stage 3a: Secondary | ICD-10-CM | POA: Diagnosis not present

## 2020-10-12 LAB — BASIC METABOLIC PANEL
Anion gap: 20 — ABNORMAL HIGH (ref 5–15)
BUN: 19 mg/dL (ref 6–20)
CO2: 15 mmol/L — ABNORMAL LOW (ref 22–32)
Calcium: 9.4 mg/dL (ref 8.9–10.3)
Chloride: 99 mmol/L (ref 98–111)
Creatinine, Ser: 1.64 mg/dL — ABNORMAL HIGH (ref 0.44–1.00)
GFR, Estimated: 41 mL/min — ABNORMAL LOW (ref >60)
Glucose, Bld: 402 mg/dL — ABNORMAL HIGH (ref 70–99)
Potassium: 3.2 mmol/L — ABNORMAL LOW (ref 3.5–5.1)
Sodium: 134 mmol/L — ABNORMAL LOW (ref 135–145)

## 2020-10-12 LAB — COMPREHENSIVE METABOLIC PANEL
ALT: 48 U/L — ABNORMAL HIGH (ref 0–44)
AST: 53 U/L — ABNORMAL HIGH (ref 15–41)
Albumin: 4.1 g/dL (ref 3.5–5.0)
Alkaline Phosphatase: 163 U/L — ABNORMAL HIGH (ref 38–126)
Anion gap: 19 — ABNORMAL HIGH (ref 5–15)
BUN: 20 mg/dL (ref 6–20)
CO2: 16 mmol/L — ABNORMAL LOW (ref 22–32)
Calcium: 9.3 mg/dL (ref 8.9–10.3)
Chloride: 97 mmol/L — ABNORMAL LOW (ref 98–111)
Creatinine, Ser: 1.55 mg/dL — ABNORMAL HIGH (ref 0.44–1.00)
GFR, Estimated: 43 mL/min — ABNORMAL LOW (ref >60)
Glucose, Bld: 412 mg/dL — ABNORMAL HIGH (ref 70–99)
Potassium: 3.1 mmol/L — ABNORMAL LOW (ref 3.5–5.1)
Sodium: 132 mmol/L — ABNORMAL LOW (ref 135–145)
Total Bilirubin: 1.5 mg/dL — ABNORMAL HIGH (ref 0.3–1.2)
Total Protein: 8.3 g/dL — ABNORMAL HIGH (ref 6.5–8.1)

## 2020-10-12 LAB — BETA-HYDROXYBUTYRIC ACID: Beta-Hydroxybutyric Acid: 4.4 mmol/L — ABNORMAL HIGH (ref 0.05–0.27)

## 2020-10-12 LAB — LIPASE, BLOOD: Lipase: 21 U/L (ref 11–51)

## 2020-10-12 LAB — CBC
HCT: 39.6 % (ref 36.0–46.0)
Hemoglobin: 12.6 g/dL (ref 12.0–15.0)
MCH: 35.4 pg — ABNORMAL HIGH (ref 26.0–34.0)
MCHC: 31.8 g/dL (ref 30.0–36.0)
MCV: 111.2 fL — ABNORMAL HIGH (ref 80.0–100.0)
Platelets: 234 10*3/uL (ref 150–400)
RBC: 3.56 MIL/uL — ABNORMAL LOW (ref 3.87–5.11)
RDW: 14.9 % (ref 11.5–15.5)
WBC: 15.2 10*3/uL — ABNORMAL HIGH (ref 4.0–10.5)
nRBC: 0 % (ref 0.0–0.2)

## 2020-10-12 LAB — RESP PANEL BY RT-PCR (FLU A&B, COVID) ARPGX2
Influenza A by PCR: NEGATIVE
Influenza B by PCR: NEGATIVE
SARS Coronavirus 2 by RT PCR: NEGATIVE

## 2020-10-12 LAB — BLOOD GAS, VENOUS
Acid-base deficit: 11 mmol/L — ABNORMAL HIGH (ref 0.0–2.0)
Bicarbonate: 14.4 mmol/L — ABNORMAL LOW (ref 20.0–28.0)
O2 Saturation: 76.5 %
Patient temperature: 98.6
pCO2, Ven: 31.6 mmHg — ABNORMAL LOW (ref 44.0–60.0)
pH, Ven: 7.28 (ref 7.250–7.430)
pO2, Ven: 50.3 mmHg — ABNORMAL HIGH (ref 32.0–45.0)

## 2020-10-12 LAB — HEMOGLOBIN A1C
Hgb A1c MFr Bld: 8.7 % — ABNORMAL HIGH (ref 4.8–5.6)
Mean Plasma Glucose: 202.99 mg/dL

## 2020-10-12 LAB — HCG, QUANTITATIVE, PREGNANCY: hCG, Beta Chain, Quant, S: <1 m[IU]/mL (ref <5)

## 2020-10-12 LAB — CBG MONITORING, ED
Glucose-Capillary: 144 mg/dL — ABNORMAL HIGH (ref 70–99)
Glucose-Capillary: 218 mg/dL — ABNORMAL HIGH (ref 70–99)
Glucose-Capillary: 267 mg/dL — ABNORMAL HIGH (ref 70–99)
Glucose-Capillary: 368 mg/dL — ABNORMAL HIGH (ref 70–99)
Glucose-Capillary: 380 mg/dL — ABNORMAL HIGH (ref 70–99)

## 2020-10-12 LAB — MAGNESIUM: Magnesium: 2 mg/dL (ref 1.7–2.4)

## 2020-10-12 MED ORDER — SODIUM CHLORIDE 0.9 % IV BOLUS
1000.0000 mL | Freq: Once | INTRAVENOUS | Status: AC
  Administered 2020-10-12: 1000 mL via INTRAVENOUS

## 2020-10-12 MED ORDER — DEXTROSE IN LACTATED RINGERS 5 % IV SOLN
INTRAVENOUS | Status: DC
Start: 2020-10-12 — End: 2020-10-13

## 2020-10-12 MED ORDER — ONDANSETRON 4 MG PO TBDP
4.0000 mg | ORAL_TABLET | Freq: Once | ORAL | Status: AC | PRN
  Administered 2020-10-12: 4 mg via ORAL
  Filled 2020-10-12: qty 1

## 2020-10-12 MED ORDER — POTASSIUM CHLORIDE 10 MEQ/100ML IV SOLN
10.0000 meq | INTRAVENOUS | Status: AC
Start: 2020-10-12 — End: 2020-10-13
  Administered 2020-10-12 – 2020-10-13 (×4): 10 meq via INTRAVENOUS
  Filled 2020-10-12 (×4): qty 100

## 2020-10-12 MED ORDER — SODIUM CHLORIDE 0.9 % IV SOLN: INTRAVENOUS | Status: DC

## 2020-10-12 MED ORDER — DEXTROSE 50 % IV SOLN: 0.0000 mL | INTRAVENOUS | Status: DC | PRN

## 2020-10-12 MED ORDER — ENOXAPARIN SODIUM 40 MG/0.4ML IJ SOSY
40.0000 mg | PREFILLED_SYRINGE | INTRAMUSCULAR | Status: DC
  Administered 2020-10-12: 40 mg via SUBCUTANEOUS
  Filled 2020-10-12: qty 0.4

## 2020-10-12 MED ORDER — MILNACIPRAN HCL 50 MG PO TABS: 50.0000 mg | ORAL_TABLET | Freq: Two times a day (BID) | ORAL | Status: DC

## 2020-10-12 MED ORDER — INSULIN REGULAR(HUMAN) IN NACL 100-0.9 UT/100ML-% IV SOLN
INTRAVENOUS | Status: DC
  Administered 2020-10-12: 8 [IU]/h via INTRAVENOUS
  Administered 2020-10-13: 2.4 [IU]/h via INTRAVENOUS
  Filled 2020-10-12: qty 100

## 2020-10-12 MED ORDER — LACTATED RINGERS IV BOLUS
20.0000 mL/kg | Freq: Once | INTRAVENOUS | Status: AC
  Administered 2020-10-12: 1560 mL via INTRAVENOUS

## 2020-10-12 NOTE — H&P (Signed)
History and Physical    Anna Garcia DUK:[id number] DOB: [birth year] DOA: 10/12/2020  PCP: Day, [name], MD  Patient coming from: Home  I have personally briefly reviewed patient's old medical records in [place]  Chief Complaint: Emesis  HPI: Anna Garcia is a 40 y.o. female with medical history significant of DM1, gastroparesis, HTN, fibromyalgia.  Pt presents to ED with c/o N/V.  Symptoms onset today.  Pt admitted in March with DKA, looks like UTI at that time as well.  No cough, SOB, CP.  Sister-in-law just tested positive for COVID.   ED Course: COVID test pending.  Pt in DKA with AG 19.  IVF and insulin gtt started.   Review of Systems: As per HPI, otherwise all review of systems negative.  Past Medical History:  Diagnosis Date   Diabetes mellitus    Gastroparesis    Hypertension    Neuropathy     History reviewed. No pertinent surgical history.   reports that she has been smoking. She has been smoking an average of 1.00 packs per day. She has never used smokeless tobacco. She reports that she does not drink alcohol and does not use drugs.  No Known Allergies  Family History  Problem Relation Age of Onset   Hypertension Mother    Dementia Mother    Heart attack Father    Hypertension Father    Heart disease Father    Hypertension Brother    Stroke Maternal Grandfather      Prior to Admission medications   Medication Sig Start Date End Date Taking? Authorizing Provider  bethanechol (URECHOLINE) 5 MG tablet Take 5 mg by mouth 3 (three) times daily. 08/28/20  Yes [provider]  DEXILANT 60 MG capsule Take 60 mg by mouth daily. 05/24/19  Yes [provider]  famotidine (PEPCID) 40 MG tablet Take 40 mg by mouth 2 (two) times daily. 05/28/19  Yes [provider]  insulin glargine (LANTUS SOLOSTAR) 100 UNIT/ML Solostar Pen Inject 12 Units into the skin in the morning and at bedtime. Patient taking differently: Inject 16-17  Units into the skin in the morning and at bedtime. 07/07/20  Yes Mercy Riding, MD  insulin glulisine (APIDRA SOLOSTAR) 100 UNIT/ML Solostar Pen Inject 1-10 Units into the skin See admin instructions. Changes as per carb intake as needed 07/13/20  Yes [provider]  levalbuterol (XOPENEX HFA) 45 MCG/ACT inhaler Inhale 1 puff into the lungs every 6 (six) hours as needed for wheezing.   Yes [provider]  losartan (COZAAR) 25 MG tablet Take 1 tablet (25 mg total) by mouth daily. 07/14/20  Yes Mercy Riding, MD  mometasone-formoterol (DULERA) 100-5 MCG/ACT AERO Inhale 2 puffs into the lungs 2 (two) times daily. 07/07/20  Yes Mercy Riding, MD  montelukast (SINGULAIR) 10 MG tablet Take 10 mg by mouth at bedtime. 05/24/19  Yes [provider]  rosuvastatin (CRESTOR) 20 MG tablet Take 20 mg by mouth daily. 05/24/19  Yes [provider]  albuterol (VENTOLIN HFA) 108 (90 Base) MCG/ACT inhaler Inhale 2 puffs into the lungs every 6 (six) hours as needed for wheezing or shortness of breath.    [provider]  amoxicillin-clavulanate (AUGMENTIN) 500-125 MG tablet Take 1 tablet (500 mg total) by mouth in the morning and at bedtime. Patient not taking: No sig reported 07/07/20   Mercy Riding, MD  insulin aspart (NOVOLOG) 100 UNIT/ML injection Inject 3 units three times a day with  meals if you eat over 50% of your meals. In addition, use sliding scale as follows: CBG 70 - 120: 0 units CBG 121 - 150: 2 units CBG 151 - 200: 3 units CBG 201 - 250: 5 units CBG 251 - 300: 8 units CBG 301 - 350: 11 units CBG 351 - 400: 15 units CBG > 400: call MD and obtain STAT lab verification Patient not taking: No sig reported 07/07/20   Mercy Riding, MD  ondansetron (ZOFRAN ODT) 4 MG disintegrating tablet Take 1 tablet (4 mg total) by mouth every 8 (eight) hours as needed for nausea or vomiting. Patient not taking: No sig reported 06/03/19   Tedd Sias, [place]    Physical Exam: Vitals:    10/12/20 1909 10/12/20 1915 10/12/20 1930 10/12/20 2000  BP: 94/73 101/74 (!) 105/51 101/77  Pulse: (!) 113 (!) 110 (!) 110 (!) 103  Resp: 20 14 16 18   Temp:      TempSrc:      SpO2: 100% 100% 100% 100%  Weight:      Height:        Constitutional: NAD, calm, comfortable Eyes: PERRL, lids and conjunctivae normal ENMT: Mucous membranes are moist. Posterior pharynx clear of any exudate or lesions.Normal dentition.  Neck: normal, supple, no masses, no thyromegaly Respiratory: clear to auscultation bilaterally, no wheezing, no crackles. Normal respiratory effort. No accessory muscle use.  Cardiovascular: Regular rate and rhythm, no murmurs / rubs / gallops. No extremity edema. 2+ pedal pulses. No carotid bruits.  Abdomen: no tenderness, no masses palpated. No hepatosplenomegaly. Bowel sounds positive.  Musculoskeletal: no clubbing / cyanosis. No joint deformity upper and lower extremities. Good ROM, no contractures. Normal muscle tone.  Skin: no rashes, lesions, ulcers. No induration Neurologic: CN 2-12 grossly intact. Sensation intact, DTR normal. Strength 5/5 in all 4.  Psychiatric: Normal judgment and insight. Alert and oriented x 3. Normal mood.    Labs on Admission: I have personally reviewed following labs and imaging studies  CBC: Recent Labs  Lab 10/12/20 1915  WBC 15.2*  HGB 12.6  HCT 39.6  MCV 111.2*  PLT 726   Basic Metabolic Panel: Recent Labs  Lab 10/12/20 1915  NA 132*  K 3.1*  CL 97*  CO2 16*  GLUCOSE 412*  BUN 20  CREATININE 1.55*  CALCIUM 9.3   GFR: Estimated Creatinine Clearance: 49.2 mL/min (A) (by C-G formula based on SCr of 1.55 mg/dL (H)). Liver Function Tests: Recent Labs  Lab 10/12/20 1915  AST 53*  ALT 48*  ALKPHOS 163*  BILITOT 1.5*  PROT 8.3*  ALBUMIN 4.1   Recent Labs  Lab 10/12/20 1915  LIPASE 21   No results for input(s): AMMONIA in the last 168 hours. Coagulation Profile: No results for input(s): INR, PROTIME in the last  168 hours. Cardiac Enzymes: No results for input(s): CKTOTAL, CKMB, CKMBINDEX, TROPONINI in the last 168 hours. BNP (last 3 results) No results for input(s): PROBNP in the last 8760 hours. HbA1C: No results for input(s): HGBA1C in the last 72 hours. CBG: Recent Labs  Lab 10/12/20 1902 10/12/20 2041 10/12/20 2143  GLUCAP 380* 368* 267*   Lipid Profile: No results for input(s): CHOL, HDL, LDLCALC, TRIG, CHOLHDL, LDLDIRECT in the last 72 hours. Thyroid Function Tests: No results for input(s): TSH, T4TOTAL, FREET4, T3FREE, THYROIDAB in the last 72 hours. Anemia Panel: No results for input(s): VITAMINB12, FOLATE, FERRITIN, TIBC, IRON, RETICCTPCT in the last 72 hours. Urine analysis:    Component  Value Date/Time   COLORURINE YELLOW 07/05/2020 1929   APPEARANCEUR HAZY (A) 07/05/2020 1929   LABSPEC 1.007 07/05/2020 1929   PHURINE 5.0 07/05/2020 1929   GLUCOSEU >=500 (A) 07/05/2020 1929   HGBUR LARGE (A) 07/05/2020 1929   BILIRUBINUR NEGATIVE 07/05/2020 1929   KETONESUR 20 (A) 07/05/2020 1929   PROTEINUR 100 (A) 07/05/2020 1929   UROBILINOGEN 0.2 04/11/2011 1437   NITRITE NEGATIVE 07/05/2020 1929   LEUKOCYTESUR MODERATE (A) 07/05/2020 1929    Radiological Exams on Admission: No results found.  EKG: Independently reviewed.  Assessment/Plan Principal Problem:   DKA, type 1 (HCC) Active Problems:   Hypertension   Fibromyalgia   CKD (chronic kidney disease) stage 3, GFR 30-59 ml/min (HCC)    DKA 1 - DKA pathway IVF and insulin gtt per pathway BMP Q4H COVID test pending (family member just tested positive), UA pending HTN - Cont home BP meds when med rec completed Fibromyalgia - Currently tapering off of Milnacipran per pain management note 6/17. Will continue this at current dose of 50mg  BID for 1 week then 25mg  BID for 1 week. CKD stage 3 - Looks to have developed CKD 3, probably diabetic nephropathy. Baseline creat 1.59 as of May labs in care everywhere  DVT  prophylaxis: Lovenox Code Status: Full Family Communication: No family in room Disposition Plan: Home after DKA treatment Consults called: None Admission status: Admit to inpatient  Severity of Illness: The appropriate patient status for this patient is INPATIENT. Inpatient status is judged to be reasonable and necessary in order to provide the required intensity of service to ensure the patient's safety. The patient's presenting symptoms, physical exam findings, and initial radiographic and laboratory data in the context of their chronic comorbidities is felt to place them at high risk for further clinical deterioration. Furthermore, it is not anticipated that the patient will be medically stable for discharge from the hospital within 2 midnights of admission. The following factors support the patient status of inpatient.   IP status for treatment of DKA.   * I certify that at the point of admission it is my clinical judgment that the patient will require inpatient hospital care spanning beyond 2 midnights from the point of admission due to high intensity of service, high risk for further deterioration and high frequency of surveillance required.*   Jeaneane Adamec M. DO [facility]  How to contact the [facility] Attending or Consulting provider [place] or covering provider during after hours [place], for this patient?  Check the care team in [facility] and look for a) attending/consulting [facility] provider listed and b) the [facility] team listed Log into [email or web address]  Amion Physician Scheduling and messaging for groups and whole hospitals  On call and physician scheduling software for group practices, residents, hospitalists and other medical providers for call, clinic, rotation and shift schedules. OnCall Enterprise is a hospital-wide system for scheduling doctors and paging doctors on call. EasyPlot is for scientific plotting and data analysis.  [email or web address]  and use [place]'s universal password to  access. If you do not have the password, please contact the hospital operator.  Locate the [facility] provider you are looking for under [facility] and page to a number that you can be directly reached. If you still have difficulty reaching the provider, please page the [facility] (Director on Call) for the Hospitalists listed on amion for assistance.  10/12/2020, 9:48 PM

## 2020-10-12 NOTE — ED Provider Notes (Signed)
Emergency Medicine Provider Triage Evaluation Note  Anna Garcia , a 40 y.o. female  was evaluated in triage.  Pt complains of n/v.  Review of Systems  Positive: N/v, tacycardic  Negative: Abd pain, fever, dysuria  Physical Exam  BP 99/64 (BP Location: Right Arm)   Pulse (!) 124   Temp 98.3 F (36.8 C) (Oral)   Resp (!) 24   Ht 5\' 4"  (1.626 m)   Wt 78 kg   LMP  (LMP Unknown) Comment: Spotting x 1 week after IUD reinsertion  SpO2 100%   BMI 29.52 kg/m  Gen:   Awake, sitting hunching over appears uncomfortable Resp:  Tachypneic, tachycardic MSK:   Moves extremities without difficulty  Other:  Mild diffused abd tenderness  Medical Decision Making  Medically screening exam initiated at 6:39 PM.  Appropriate orders placed.  Shauna Hugh was informed that the remainder of the evaluation will be completed by another provider, this initial triage assessment does not replace that evaluation, and the importance of remaining in the ED until their evaluation is complete.  Hx of DM, here with gen fatigue, nausea and vomiting since yesterday.  Brittle diabetic.     Domenic Moras, PA-C 10/12/20 1847    Horton, Alvin Critchley, DO 10/12/20 2222

## 2020-10-12 NOTE — ED Triage Notes (Signed)
Pt states that she has DM1 and gastroparesis and started having vomiting with palpitations today. Alert and oriented.

## 2020-10-12 NOTE — ED Provider Notes (Signed)
[facility] DEPT Provider Note   CSN: [id number] Arrival date & time: 10/12/20  1800     History Chief Complaint  Patient presents with   Emesis   Tachycardia    Anna Garcia is a 40 y.o. female.  40 year old female with history of diabetes, gastroparesis, hypertension presents with complaint of feeling generally unwell with nausea and vomiting today.  Patient was admitted to this in March of this year with DKA, prior to that has not had an episode of DKA for several years.  Denies fevers, chills, abdominal pain, sick contacts.  Patient states that she is unable to tolerate anything by mouth today with numerous episodes emesis.  Patient reports compliance with her medications.  No other complaints or concerns.      Past Medical History:  Diagnosis Date   Diabetes mellitus    Gastroparesis    Hypertension    Neuropathy     Patient Active Problem List   Diagnosis Date Noted   DKA, type 1 ([place]) 10/12/2020   Fibromyalgia 10/12/2020   CKD (chronic kidney disease) stage 3, GFR 30-59 ml/min ([place]) 10/12/2020   DKA (diabetic ketoacidosis) ([place]) 07/05/2020   SIRS (systemic inflammatory response syndrome) ([place]) 07/05/2020   Asthma 07/05/2020   AKI (acute kidney injury) ([place]) 07/05/2020   LFT elevation 07/05/2020   Hypertension    Gastroparesis    DIABETES MELLITUS, I 06/19/2006   NEUROPATHY, PERIPHERAL 06/19/2006   ACNE 06/19/2006   VOMITING W/NAUSEA 06/19/2006    No past surgical history on file.   OB History   No obstetric history on file.     No family history on file.  Social History   Tobacco Use   Smoking status: Every Day    Packs/day: 1.00    Pack years: 0.00    Types: Cigarettes   Smokeless tobacco: Never  Vaping Use   Vaping Use: Never used  Substance Use Topics   Alcohol use: No   Drug use: No    Home Medications Prior to Admission medications   Medication Sig Start Date End Date Taking? Authorizing Provider   albuterol (VENTOLIN HFA) 108 (90 Base) MCG/ACT inhaler Inhale 2 puffs into the lungs every 6 (six) hours as needed for wheezing or shortness of breath.    [provider]  amoxicillin-clavulanate (AUGMENTIN) 500-125 MG tablet Take 1 tablet (500 mg total) by mouth in the morning and at bedtime. 07/07/20   Mercy Riding, MD  cetirizine (ZYRTEC) 10 MG tablet Take 10 mg by mouth at bedtime.    [provider]  DEXILANT 60 MG capsule Take 1 capsule by mouth daily. 05/24/19   [provider]  famotidine (PEPCID) 40 MG tablet Take 40 mg by mouth 2 (two) times daily. 05/28/19   [provider]  insulin aspart (NOVOLOG) 100 UNIT/ML injection Inject 3 units three times a day with meals if you eat over 50% of your meals. In addition, use sliding scale as follows: CBG 70 - 120: 0 units CBG 121 - 150: 2 units CBG 151 - 200: 3 units CBG 201 - 250: 5 units CBG 251 - 300: 8 units CBG 301 - 350: 11 units CBG 351 - 400: 15 units CBG > 400: call MD and obtain STAT lab verification 07/07/20   Mercy Riding, MD  insulin glargine (LANTUS SOLOSTAR) 100 UNIT/ML Solostar Pen Inject 12 Units into the skin in the morning and at bedtime. 07/07/20   Mercy Riding, MD  losartan (  COZAAR) 25 MG tablet Take 1 tablet (25 mg total) by mouth daily. 07/14/20   Mercy Riding, MD  mometasone-formoterol (DULERA) 100-5 MCG/ACT AERO Inhale 2 puffs into the lungs 2 (two) times daily. 07/07/20   Mercy Riding, MD  montelukast (SINGULAIR) 10 MG tablet Take 10 mg by mouth at bedtime. 05/24/19   [provider]  ondansetron (ZOFRAN ODT) 4 MG disintegrating tablet Take 1 tablet (4 mg total) by mouth every 8 (eight) hours as needed for nausea or vomiting. Patient not taking: Reported on 07/05/2020 06/03/19   Tedd Sias, PA  rosuvastatin (CRESTOR) 20 MG tablet Take 20 mg by mouth daily. 05/24/19   [provider]    Allergies    Patient has no known allergies.  Review of Systems   Review of Systems   Constitutional:  Negative for chills, diaphoresis and fever.  Respiratory:  Negative for shortness of breath.   Cardiovascular:  Negative for chest pain.  Gastrointestinal:  Positive for nausea and vomiting. Negative for abdominal pain, blood in stool, constipation and diarrhea.  Endocrine: Positive for polydipsia.  Genitourinary:  Negative for dysuria.  Musculoskeletal:  Negative for arthralgias and myalgias.  Skin:  Negative for rash and wound.  Allergic/Immunologic: Positive for immunocompromised state.  Neurological:  Negative for weakness.  Hematological:  Negative for adenopathy.  Psychiatric/Behavioral:  Negative for confusion.   All other systems reviewed and are negative.  Physical Exam Updated Vital Signs BP 101/77   Pulse (!) 103   Temp 98.3 F (36.8 C) (Oral)   Resp 18   Ht 5' 4"  (1.626 m)   Wt 78 kg   LMP  (LMP Unknown) Comment: Spotting x 1 week after IUD reinsertion  SpO2 100%   BMI 29.52 kg/m   Physical Exam Vitals and nursing note reviewed.  Constitutional:      General: She is not in acute distress.    Appearance: She is well-developed. She is not diaphoretic.  HENT:     Head: Normocephalic and atraumatic.     Mouth/Throat:     Mouth: Mucous membranes are dry.  Cardiovascular:     Rate and Rhythm: Regular rhythm. Tachycardia present.     Pulses: Normal pulses.     Heart sounds: Normal heart sounds.  Pulmonary:     Effort: Pulmonary effort is normal.     Breath sounds: Normal breath sounds.  Abdominal:     Palpations: Abdomen is soft.     Tenderness: There is no abdominal tenderness.  Musculoskeletal:     Right lower leg: No edema.     Left lower leg: No edema.  Skin:    General: Skin is warm and dry.     Findings: No erythema or rash.  Neurological:     Mental Status: She is alert and oriented to person, place, and time.  Psychiatric:        Behavior: Behavior normal.    ED Results / Procedures / Treatments   Labs (all labs ordered are  listed, but only abnormal results are displayed) Labs Reviewed  COMPREHENSIVE METABOLIC PANEL - Abnormal; Notable for the following components:      Result Value   Sodium 132 (*)    Potassium 3.1 (*)    Chloride 97 (*)    CO2 16 (*)    Glucose, Bld 412 (*)    Creatinine, Ser 1.55 (*)    Total Protein 8.3 (*)    AST 53 (*)    ALT 48 (*)  Alkaline Phosphatase 163 (*)    Total Bilirubin 1.5 (*)    GFR, Estimated 43 (*)    Anion gap 19 (*)    All other components within normal limits  CBC - Abnormal; Notable for the following components:   WBC 15.2 (*)    RBC 3.56 (*)    MCV 111.2 (*)    MCH 35.4 (*)    All other components within normal limits  CBG MONITORING, ED - Abnormal; Notable for the following components:   Glucose-Capillary 380 (*)    All other components within normal limits  RESP PANEL BY RT-PCR (FLU A&B, COVID) ARPGX2  LIPASE, BLOOD  URINALYSIS, ROUTINE W REFLEX MICROSCOPIC  BASIC METABOLIC PANEL  BASIC METABOLIC PANEL  BASIC METABOLIC PANEL  BASIC METABOLIC PANEL  BETA-HYDROXYBUTYRIC ACID  BETA-HYDROXYBUTYRIC ACID  BLOOD GAS, VENOUS  MAGNESIUM  HEMOGLOBIN A1C  I-STAT BETA HCG BLOOD, ED ([facility], [facility], [facility] ONLY)    EKG None  Radiology No results found.  Procedures .Critical Care  Date/Time: 10/12/2020 8:38 PM Performed by: Tacy Learn, PA-C Authorized by: Tacy Learn, PA-C   Critical care provider statement:    Critical care time (minutes):  45   Critical care was time spent personally by me on the following activities:  Discussions with consultants, evaluation of patient's response to treatment, examination of patient, ordering and performing treatments and interventions, ordering and review of laboratory studies, ordering and review of radiographic studies, pulse oximetry, re-evaluation of patient's condition, obtaining history from patient or surrogate and review of old charts   Medications Ordered in ED Medications  lactated ringers bolus  1,560 mL (has no administration in time range)  insulin regular, human (MYXREDLIN) 100 units/ 100 mL infusion (has no administration in time range)  dextrose 5 % in lactated ringers infusion (0 mLs Intravenous Hold 10/12/20 2011)  dextrose 50 % solution 0-50 mL (has no administration in time range)  0.9 %  sodium chloride infusion (has no administration in time range)  potassium chloride 10 mEq in 100 mL IVPB (has no administration in time range)  Milnacipran (SAVELLA) tablet TABS 50 mg (has no administration in time range)  enoxaparin (LOVENOX) injection 40 mg (has no administration in time range)  ondansetron (ZOFRAN-ODT) disintegrating tablet 4 mg (4 mg Oral Given 10/12/20 1833)  sodium chloride 0.9 % bolus 1,000 mL (1,000 mLs Intravenous New Bag/Given (Non-Interop) 10/12/20 1907)    ED Course  I have reviewed the triage vital signs and the nursing notes.  Pertinent labs & imaging results that were available during my care of the patient were reviewed by me and considered in my medical decision making (see chart for details).  Clinical Course as of 10/12/20 2039  Thu Oct 13, 2030  4948 40 year old female with history of diabetes presents with vomiting today, unable to keep anything down at home and feeling generally unwell without abdominal pain. On exam, peers to feel unwell, abdomen is soft and nontender, mildly tachycardic, lungs clear, dry mucous membranes. Labs available upon arrival in the room, found to have leukocytosis white count of 15.2, possibly secondary to vomiting.  CMP concerning for DKA with glucose of 412, bicarb of 16, anion gap of 19.  Mild transaminitis with AST of 53 and ALT of 48, elevated alk phos.  Hypokalemia with potassium of 3.1. DKA orders initiated with IV fluids, insulin drip as well as IV potassium for her hypokalemia.  Consult to Dr. Alcario Drought with [facility] will consult for admission. [LM]  Clinical Course User Index [LM] Roque Lias   MDM Rules/Calculators/A&P                           Final Clinical Impression(s) / ED Diagnoses Final diagnoses:  Diabetic ketoacidosis without coma associated with type 1 diabetes mellitus [facility])    Rx / DC Orders ED Discharge Orders     None        Roque Lias 10/12/20 2039    Lacretia Leigh, MD 10/12/20 2308

## 2020-10-13 DIAGNOSIS — E1010 Type 1 diabetes mellitus with ketoacidosis without coma: Secondary | ICD-10-CM | POA: Diagnosis not present

## 2020-10-13 LAB — URINALYSIS, ROUTINE W REFLEX MICROSCOPIC
Bilirubin Urine: NEGATIVE
Glucose, UA: >=500 mg/dL — AB
Ketones, ur: 20 mg/dL — AB
Leukocytes,Ua: NEGATIVE
Nitrite: NEGATIVE
Protein, ur: 100 mg/dL — AB
Specific Gravity, Urine: 1.018 (ref 1.005–1.030)
pH: 5 (ref 5.0–8.0)

## 2020-10-13 LAB — BASIC METABOLIC PANEL
Anion gap: 8 (ref 5–15)
BUN: 18 mg/dL (ref 6–20)
CO2: 20 mmol/L — ABNORMAL LOW (ref 22–32)
Calcium: 8.4 mg/dL — ABNORMAL LOW (ref 8.9–10.3)
Chloride: 107 mmol/L (ref 98–111)
Creatinine, Ser: 1.26 mg/dL — ABNORMAL HIGH (ref 0.44–1.00)
GFR, Estimated: 56 mL/min — ABNORMAL LOW (ref >60)
Glucose, Bld: 200 mg/dL — ABNORMAL HIGH (ref 70–99)
Potassium: 3.9 mmol/L (ref 3.5–5.1)
Sodium: 135 mmol/L (ref 135–145)

## 2020-10-13 LAB — CBG MONITORING, ED
Glucose-Capillary: 178 mg/dL — ABNORMAL HIGH (ref 70–99)
Glucose-Capillary: 189 mg/dL — ABNORMAL HIGH (ref 70–99)
Glucose-Capillary: 189 mg/dL — ABNORMAL HIGH (ref 70–99)
Glucose-Capillary: 154 mg/dL — ABNORMAL HIGH (ref 70–99)
Glucose-Capillary: 119 mg/dL — ABNORMAL HIGH (ref 70–99)

## 2020-10-13 LAB — BASIC METABOLIC PANEL WITH GFR
Anion gap: 9 (ref 5–15)
BUN: 19 mg/dL (ref 6–20)
CO2: 21 mmol/L — ABNORMAL LOW (ref 22–32)
Calcium: 8.7 mg/dL — ABNORMAL LOW (ref 8.9–10.3)
Chloride: 107 mmol/L (ref 98–111)
Creatinine, Ser: 1.29 mg/dL — ABNORMAL HIGH (ref 0.44–1.00)
GFR, Estimated: 54 mL/min — ABNORMAL LOW
Glucose, Bld: 94 mg/dL (ref 70–99)
Potassium: 3.6 mmol/L (ref 3.5–5.1)
Sodium: 137 mmol/L (ref 135–145)

## 2020-10-13 LAB — GLUCOSE, CAPILLARY: Glucose-Capillary: 90 mg/dL (ref 70–99)

## 2020-10-13 LAB — BETA-HYDROXYBUTYRIC ACID: Beta-Hydroxybutyric Acid: 1.66 mmol/L — ABNORMAL HIGH (ref 0.05–0.27)

## 2020-10-13 MED ORDER — INSULIN ASPART 100 UNIT/ML IJ SOLN
1.0000 [IU] | Freq: Three times a day (TID) | INTRAMUSCULAR | Status: DC
Start: 2020-10-13 — End: 2020-10-13
  Administered 2020-10-13: 1 [IU] via SUBCUTANEOUS
  Filled 2020-10-13: qty 0.1

## 2020-10-13 MED ORDER — FAMOTIDINE 20 MG PO TABS
40.0000 mg | ORAL_TABLET | Freq: Two times a day (BID) | ORAL | Status: DC
  Administered 2020-10-13: 40 mg via ORAL
  Filled 2020-10-13: qty 2

## 2020-10-13 MED ORDER — LOSARTAN POTASSIUM 25 MG PO TABS
25.0000 mg | ORAL_TABLET | Freq: Every day | ORAL | Status: DC
  Administered 2020-10-13: 25 mg via ORAL
  Filled 2020-10-13: qty 1

## 2020-10-13 MED ORDER — ALBUTEROL SULFATE HFA 108 (90 BASE) MCG/ACT IN AERS: 2.0000 | INHALATION_SPRAY | Freq: Four times a day (QID) | RESPIRATORY_TRACT | Status: DC | PRN

## 2020-10-13 MED ORDER — ROSUVASTATIN CALCIUM 20 MG PO TABS
20.0000 mg | ORAL_TABLET | Freq: Every day | ORAL | Status: DC
  Administered 2020-10-13: 20 mg via ORAL
  Filled 2020-10-13: qty 1

## 2020-10-13 MED ORDER — INSULIN GLARGINE 100 UNIT/ML ~~LOC~~ SOLN
12.0000 [IU] | Freq: Two times a day (BID) | SUBCUTANEOUS | Status: DC
  Administered 2020-10-13 (×2): 12 [IU] via SUBCUTANEOUS
  Filled 2020-10-13 (×3): qty 0.12

## 2020-10-13 MED ORDER — BETHANECHOL CHLORIDE 5 MG PO TABS
5.0000 mg | ORAL_TABLET | Freq: Three times a day (TID) | ORAL | Status: DC
  Administered 2020-10-13: 5 mg via ORAL
  Filled 2020-10-13 (×3): qty 1

## 2020-10-13 MED ORDER — MOMETASONE FURO-FORMOTEROL FUM 100-5 MCG/ACT IN AERO
2.0000 | INHALATION_SPRAY | Freq: Two times a day (BID) | RESPIRATORY_TRACT | Status: DC
  Administered 2020-10-13: 2 via RESPIRATORY_TRACT
  Filled 2020-10-13: qty 8.8

## 2020-10-13 MED ORDER — LEVALBUTEROL TARTRATE 45 MCG/ACT IN AERO: 1.0000 | INHALATION_SPRAY | Freq: Four times a day (QID) | RESPIRATORY_TRACT | Status: DC | PRN

## 2020-10-13 MED ORDER — PANTOPRAZOLE SODIUM 40 MG PO TBEC
80.0000 mg | DELAYED_RELEASE_TABLET | Freq: Every day | ORAL | Status: DC
  Administered 2020-10-13: 80 mg via ORAL
  Filled 2020-10-13: qty 2

## 2020-10-13 MED ORDER — ALBUTEROL SULFATE (2.5 MG/3ML) 0.083% IN NEBU: 2.5000 mg | INHALATION_SOLUTION | Freq: Four times a day (QID) | RESPIRATORY_TRACT | Status: DC | PRN

## 2020-10-13 MED ORDER — LACTATED RINGERS IV BOLUS
1000.0000 mL | Freq: Once | INTRAVENOUS | Status: AC
  Administered 2020-10-13: 1000 mL via INTRAVENOUS

## 2020-10-13 MED ORDER — INSULIN ASPART 100 UNIT/ML IJ SOLN
0.0000 [IU] | Freq: Three times a day (TID) | INTRAMUSCULAR | Status: DC
  Filled 2020-10-13: qty 0.09

## 2020-10-13 MED ORDER — MONTELUKAST SODIUM 10 MG PO TABS: 10.0000 mg | ORAL_TABLET | Freq: Every day | ORAL | Status: DC

## 2020-10-13 NOTE — Progress Notes (Signed)
Patient states she ran out of [place] several days ago so did not complete taper as prescribed by pain clinic.  She states her pain has been controlled and she does not wish to resume taking medication.  Discussed with Dr Alcario Drought and inpatient order d/c'd per patient preference.   She also states she has not started taking the naltrexone as recommended once taper complete and therefore does not need this while inpatient.   Netta Cedars, PharmD, BCPS 10/13/2020@12 :25 AM

## 2020-10-13 NOTE — ED Notes (Signed)
Insulin discontinued. No correction Novolog needed as pt's CBG was 119.

## 2020-10-13 NOTE — Care Management CC44 (Signed)
Condition Code 44 Documentation Completed  Patient Details  Name: Anna Garcia MRN: [id number] Date of Birth: [birth year]   Condition Code 44 given:  Yes Patient signature on Condition Code 44 notice:  Yes Documentation of 2 MD's agreement:  Yes Code 44 added to claim:  Yes    Purcell Mouton, RN 10/13/2020, 9:21 AM

## 2020-10-13 NOTE — Progress Notes (Signed)
Pt discharged to home, instructions reviewed with by RN, Cloretta Ned. Pt acknowledged understanding. SRP, RN

## 2020-10-13 NOTE — Discharge Summary (Signed)
Physician Discharge Summary  Anna Garcia ZCH:[id number] DOB: 1981/01/13 DOA: 10/12/2020  PCP: [name], MD  Admit date: 10/12/2020 Discharge date: 10/13/2020 30 Day Unplanned Readmission Risk Score    Flowsheet Row ED to Hosp-Admission (Current) from 10/12/2020 in [place]  30 Day Unplanned Readmission Risk Score (%) 13.88 Filed at 10/13/2020 0801       This score is the patient's risk of an unplanned readmission within 30 days of being discharged (0 -100%). The score is based on dignosis, age, lab data, medications, orders, and past utilization.   Low:  0-14.9   Medium: 15-21.9   High: 22-29.9   Extreme: 30 and above           Admitted From: Home Disposition: Home  Recommendations for Outpatient Follow-up:  Follow up with PCP in 1-2 weeks Please obtain BMP/CBC in one week Please follow up with your PCP on the following pending results: Unresulted Labs (From admission, onward)     Start     Ordered   10/13/20 2878  Basic metabolic panel  Daily,   R      10/13/20 0310              Home Health: None Equipment/Devices: None  Discharge Condition: Stable CODE STATUS: Full code Diet recommendation: Diabetic/consistent carbohydrate  Subjective: Seen and examined.  Feels much better.  No nausea vomiting.  Ready to go home.  Brief/Interim Summary: 40 year old female with history of type 1 diabetes mellitus, hypertension, gastroparesis, fibromyalgia and CKD stage IIIa presented with nausea and vomiting and was admitted to hospital service due to DKA.  She was started on IV fluids, insulin drip per protocol and subsequently her anion gap closed and she was transitioned to Lantus and IV fluids were started.  Her anion gap has remained closed.  Her DKA has resolved.  She has no symptoms.  She is being discharged in stable condition.  Discharge Diagnoses:  Principal Problem:   DKA, type 1 ([place]) Active Problems:   Hypertension    Fibromyalgia   CKD stage G3a/A3, GFR 45-59 and albumin creatinine ratio >300 mg/g [facility])    Discharge Instructions   Allergies as of 10/13/2020   No Known Allergies      Medication List     TAKE these medications    albuterol 108 (90 Base) MCG/ACT inhaler Commonly known as: VENTOLIN HFA Inhale 2 puffs into the lungs every 6 (six) hours as needed for wheezing or shortness of breath.   Apidra SoloStar 100 UNIT/ML Solostar Pen Generic drug: insulin glulisine Inject 1-10 Units into the skin See admin instructions. Changes as per carb intake as needed   bethanechol 5 MG tablet Commonly known as: URECHOLINE Take 5 mg by mouth 3 (three) times daily.   Dexilant 60 MG capsule Generic drug: dexlansoprazole Take 60 mg by mouth daily.   famotidine 40 MG tablet Commonly known as: PEPCID Take 40 mg by mouth 2 (two) times daily.   Lantus SoloStar 100 UNIT/ML Solostar Pen Generic drug: insulin glargine Inject 12 Units into the skin in the morning and at bedtime. What changed: how much to take   levalbuterol 45 MCG/ACT inhaler Commonly known as: XOPENEX HFA Inhale 1 puff into the lungs every 6 (six) hours as needed for wheezing.   losartan 25 MG tablet Commonly known as: COZAAR Take 1 tablet (25 mg total) by mouth daily.   mometasone-formoterol 100-5 MCG/ACT Aero Commonly known as: DULERA Inhale 2 puffs  into the lungs 2 (two) times daily.   montelukast 10 MG tablet Commonly known as: SINGULAIR Take 10 mg by mouth at bedtime.   rosuvastatin 20 MG tablet Commonly known as: CRESTOR Take 20 mg by mouth daily.        No Known Allergies  Consultations: None   Procedures/Studies: No results found.   Discharge Exam: Vitals:   10/13/20 0625 10/13/20 0818  BP: (!) 90/57   Pulse: 92   Resp: 18   Temp: 98.2 F (36.8 C)   SpO2: 99% 100%   Vitals:   10/13/20 0445 10/13/20 0500 10/13/20 0625 10/13/20 0818  BP:  106/69 (!) 90/57   Pulse: 93 87 92   Resp: 15 15  18    Temp:   98.2 F (36.8 C)   TempSrc:   Oral   SpO2: 98% 98% 99% 100%  Weight:      Height:        General: Pt is alert, awake, not in acute distress Cardiovascular: RRR, S1/S2 +, no rubs, no gallops Respiratory: CTA bilaterally, no wheezing, no rhonchi Abdominal: Soft, NT, ND, bowel sounds + Extremities: no edema, no cyanosis    The results of significant diagnostics from this hospitalization (including imaging, microbiology, ancillary and laboratory) are listed below for reference.     Microbiology: Recent Results (from the past 240 hour(s))  Resp Panel by RT-PCR (Flu A&B, Covid) Nasopharyngeal Swab     Status: None   Collection Time: 10/12/20  8:09 PM   Specimen: Nasopharyngeal Swab; Nasopharyngeal(NP) swabs in vial transport medium  Result Value Ref Range Status   SARS Coronavirus 2 by RT PCR NEGATIVE NEGATIVE Final    Comment: (NOTE) SARS-CoV-2 target nucleic acids are NOT DETECTED.  The SARS-CoV-2 RNA is generally detectable in upper respiratory specimens during the acute phase of infection. The lowest concentration of SARS-CoV-2 viral copies this assay can detect is 138 copies/mL. A negative result does not preclude SARS-Cov-2 infection and should not be used as the sole basis for treatment or other patient management decisions. A negative result may occur with  improper specimen collection/handling, submission of specimen other than nasopharyngeal swab, presence of viral mutation(s) within the areas targeted by this assay, and inadequate number of viral copies(<138 copies/mL). A negative result must be combined with clinical observations, patient history, and epidemiological information. The expected result is Negative.  Fact Sheet for Patients:  EntrepreneurPulse.com.au  Fact Sheet for Healthcare Providers:  IncredibleEmployment.be  This test is no t yet approved or cleared by the [place] FDA and  has been  authorized for detection and/or diagnosis of SARS-CoV-2 by FDA under an Emergency Use Authorization (EUA). This EUA will remain  in effect (meaning this test can be used) for the duration of the COVID-19 declaration under Section 564(b)(1) of the Act, 21 U.S.C.section 360bbb-3(b)(1), unless the authorization is terminated  or revoked sooner.       Influenza A by PCR NEGATIVE NEGATIVE Final   Influenza B by PCR NEGATIVE NEGATIVE Final    Comment: (NOTE) The Xpert Xpress SARS-CoV-2/FLU/RSV plus assay is intended as an aid in the diagnosis of influenza from Nasopharyngeal swab specimens and should not be used as a sole basis for treatment. Nasal washings and aspirates are unacceptable for Xpert Xpress SARS-CoV-2/FLU/RSV testing.  Fact Sheet for Patients: EntrepreneurPulse.com.au  Fact Sheet for Healthcare Providers: IncredibleEmployment.be  This test is not yet approved or cleared by the [place] FDA and has been authorized for detection and/or diagnosis of SARS-CoV-2  by FDA under an Emergency Use Authorization (EUA). This EUA will remain in effect (meaning this test can be used) for the duration of the COVID-19 declaration under Section 564(b)(1) of the Act, 21 U.S.C. section 360bbb-3(b)(1), unless the authorization is terminated or revoked.  Performed at [facility], [place] [address], [place], [place] [address]      Labs: BNP (last 3 results) No results for input(s): BNP in the last 8760 hours. Basic Metabolic Panel: Recent Labs  Lab 10/12/20 1915 10/12/20 2100 10/13/20 0200 10/13/20 0653  NA 132* 134* 135 137  K 3.1* 3.2* 3.9 3.6  CL 97* 99 107 107  CO2 16* 15* 20* 21*  GLUCOSE 412* 402* 200* 94  BUN 20 19 18 19   CREATININE 1.55* 1.64* 1.26* 1.29*  CALCIUM 9.3 9.4 8.4* 8.7*  MG  --  2.0  --   --    Liver Function Tests: Recent Labs  Lab 10/12/20 1915  AST 53*  ALT 48*  ALKPHOS 163*  BILITOT 1.5*   PROT 8.3*  ALBUMIN 4.1   Recent Labs  Lab 10/12/20 1915  LIPASE 21   No results for input(s): AMMONIA in the last 168 hours. CBC: Recent Labs  Lab 10/12/20 1915  WBC 15.2*  HGB 12.6  HCT 39.6  MCV 111.2*  PLT 234   Cardiac Enzymes: No results for input(s): CKTOTAL, CKMB, CKMBINDEX, TROPONINI in the last 168 hours. BNP: Invalid input(s): POCBNP CBG: Recent Labs  Lab 10/13/20 0207 10/13/20 0322 10/13/20 0425 10/13/20 0531 10/13/20 0746  GLUCAP 189* 189* 154* 119* 90   D-Dimer No results for input(s): DDIMER in the last 72 hours. Hgb A1c Recent Labs    10/12/20 1934  HGBA1C 8.7*   Lipid Profile No results for input(s): CHOL, HDL, LDLCALC, TRIG, CHOLHDL, LDLDIRECT in the last 72 hours. Thyroid function studies No results for input(s): TSH, T4TOTAL, T3FREE, THYROIDAB in the last 72 hours.  Invalid input(s): FREET3 Anemia work up No results for input(s): VITAMINB12, FOLATE, FERRITIN, TIBC, IRON, RETICCTPCT in the last 72 hours. Urinalysis    Component Value Date/Time   COLORURINE YELLOW 10/13/2020 0100   APPEARANCEUR CLOUDY (A) 10/13/2020 0100   LABSPEC 1.018 10/13/2020 0100   PHURINE 5.0 10/13/2020 0100   GLUCOSEU >=500 (A) 10/13/2020 0100   HGBUR MODERATE (A) 10/13/2020 0100   BILIRUBINUR NEGATIVE 10/13/2020 0100   KETONESUR 20 (A) 10/13/2020 0100   PROTEINUR 100 (A) 10/13/2020 0100   UROBILINOGEN 0.2 04/11/2011 1437   NITRITE NEGATIVE 10/13/2020 0100   LEUKOCYTESUR NEGATIVE 10/13/2020 0100   Sepsis Labs Invalid input(s): PROCALCITONIN,  WBC,  LACTICIDVEN Microbiology Recent Results (from the past 240 hour(s))  Resp Panel by RT-PCR (Flu A&B, Covid) Nasopharyngeal Swab     Status: None   Collection Time: 10/12/20  8:09 PM   Specimen: Nasopharyngeal Swab; Nasopharyngeal(NP) swabs in vial transport medium  Result Value Ref Range Status   SARS Coronavirus 2 by RT PCR NEGATIVE NEGATIVE Final    Comment: (NOTE) SARS-CoV-2 target nucleic acids are NOT  DETECTED.  The SARS-CoV-2 RNA is generally detectable in upper respiratory specimens during the acute phase of infection. The lowest concentration of SARS-CoV-2 viral copies this assay can detect is 138 copies/mL. A negative result does not preclude SARS-Cov-2 infection and should not be used as the sole basis for treatment or other patient management decisions. A negative result may occur with  improper specimen collection/handling, submission of specimen other than nasopharyngeal swab, presence of viral mutation(s) within the areas  targeted by this assay, and inadequate number of viral copies(<138 copies/mL). A negative result must be combined with clinical observations, patient history, and epidemiological information. The expected result is Negative.  Fact Sheet for Patients:  EntrepreneurPulse.com.au  Fact Sheet for Healthcare Providers:  IncredibleEmployment.be  This test is no t yet approved or cleared by the [place] FDA and  has been authorized for detection and/or diagnosis of SARS-CoV-2 by FDA under an Emergency Use Authorization (EUA). This EUA will remain  in effect (meaning this test can be used) for the duration of the COVID-19 declaration under Section 564(b)(1) of the Act, 21 U.S.C.section 360bbb-3(b)(1), unless the authorization is terminated  or revoked sooner.       Influenza A by PCR NEGATIVE NEGATIVE Final   Influenza B by PCR NEGATIVE NEGATIVE Final    Comment: (NOTE) The Xpert Xpress SARS-CoV-2/FLU/RSV plus assay is intended as an aid in the diagnosis of influenza from Nasopharyngeal swab specimens and should not be used as a sole basis for treatment. Nasal washings and aspirates are unacceptable for Xpert Xpress SARS-CoV-2/FLU/RSV testing.  Fact Sheet for Patients: EntrepreneurPulse.com.au  Fact Sheet for Healthcare Providers: IncredibleEmployment.be  This test is not yet  approved or cleared by the [place] FDA and has been authorized for detection and/or diagnosis of SARS-CoV-2 by FDA under an Emergency Use Authorization (EUA). This EUA will remain in effect (meaning this test can be used) for the duration of the COVID-19 declaration under Section 564(b)(1) of the Act, 21 U.S.C. section 360bbb-3(b)(1), unless the authorization is terminated or revoked.  Performed at [facility], [place] [address], [place], [place] [address]      Time coordinating discharge: Over 30 minutes  SIGNED:   Darliss Cheney, MD  [facility] 10/13/2020, 9:49 AM  If 7PM-7AM, please contact night-coverage [email or web address]

## 2020-10-13 NOTE — Plan of Care (Signed)
Initiated general care plan

## 2020-10-13 NOTE — Care Management Obs Status (Signed)
[facility] NOTIFICATION   Patient Details  Name: Anna Garcia MRN: [id number] Date of Birth: [birth year]   Medicare Observation Status Notification Given:  Yes    Purcell Mouton, RN 10/13/2020, 9:21 AM

## 2021-05-21 ENCOUNTER — Inpatient Hospital Stay (HOSPITAL_COMMUNITY)
Admission: EM | Admit: 2021-05-21 | Discharge: 2021-05-28 | DRG: 988 | Disposition: A | Discharge status: Home / Self Care | Payer: Medicare Other | Attending: Internal Medicine | Admitting: Internal Medicine

## 2021-05-21 ENCOUNTER — Emergency Department (HOSPITAL_COMMUNITY): Payer: Medicare Other

## 2021-05-21 ENCOUNTER — Encounter (HOSPITAL_COMMUNITY): Payer: Self-pay

## 2021-05-21 DIAGNOSIS — K3184 Gastroparesis: Secondary | ICD-10-CM | POA: Diagnosis present

## 2021-05-21 DIAGNOSIS — E663 Overweight: Secondary | ICD-10-CM | POA: Diagnosis present

## 2021-05-21 DIAGNOSIS — E1010 Type 1 diabetes mellitus with ketoacidosis without coma: Secondary | ICD-10-CM | POA: Diagnosis not present

## 2021-05-21 DIAGNOSIS — E109 Type 1 diabetes mellitus without complications: Secondary | ICD-10-CM | POA: Diagnosis present

## 2021-05-21 DIAGNOSIS — G8929 Other chronic pain: Secondary | ICD-10-CM | POA: Diagnosis present

## 2021-05-21 DIAGNOSIS — N179 Acute kidney failure, unspecified: Secondary | ICD-10-CM | POA: Diagnosis not present

## 2021-05-21 DIAGNOSIS — I959 Hypotension, unspecified: Secondary | ICD-10-CM | POA: Diagnosis present

## 2021-05-21 DIAGNOSIS — N9489 Other specified conditions associated with female genital organs and menstrual cycle: Secondary | ICD-10-CM | POA: Diagnosis present

## 2021-05-21 DIAGNOSIS — E1022 Type 1 diabetes mellitus with diabetic chronic kidney disease: Secondary | ICD-10-CM | POA: Diagnosis present

## 2021-05-21 DIAGNOSIS — Z6828 Body mass index (BMI) 28.0-28.9, adult: Secondary | ICD-10-CM

## 2021-05-21 DIAGNOSIS — N764 Abscess of vulva: Secondary | ICD-10-CM | POA: Diagnosis present

## 2021-05-21 DIAGNOSIS — E876 Hypokalemia: Secondary | ICD-10-CM | POA: Diagnosis not present

## 2021-05-21 DIAGNOSIS — Z794 Long term (current) use of insulin: Secondary | ICD-10-CM

## 2021-05-21 DIAGNOSIS — E1110 Type 2 diabetes mellitus with ketoacidosis without coma: Secondary | ICD-10-CM | POA: Diagnosis not present

## 2021-05-21 DIAGNOSIS — Z823 Family history of stroke: Secondary | ICD-10-CM

## 2021-05-21 DIAGNOSIS — Z79899 Other long term (current) drug therapy: Secondary | ICD-10-CM

## 2021-05-21 DIAGNOSIS — E1343 Other specified diabetes mellitus with diabetic autonomic (poly)neuropathy: Secondary | ICD-10-CM | POA: Diagnosis present

## 2021-05-21 DIAGNOSIS — Z87891 Personal history of nicotine dependence: Secondary | ICD-10-CM

## 2021-05-21 DIAGNOSIS — K760 Fatty (change of) liver, not elsewhere classified: Secondary | ICD-10-CM | POA: Diagnosis present

## 2021-05-21 DIAGNOSIS — R000 Tachycardia, unspecified: Secondary | ICD-10-CM | POA: Diagnosis present

## 2021-05-21 DIAGNOSIS — R112 Nausea with vomiting, unspecified: Secondary | ICD-10-CM

## 2021-05-21 DIAGNOSIS — N1832 Chronic kidney disease, stage 3b: Secondary | ICD-10-CM | POA: Diagnosis present

## 2021-05-21 DIAGNOSIS — E1042 Type 1 diabetes mellitus with diabetic polyneuropathy: Secondary | ICD-10-CM | POA: Diagnosis present

## 2021-05-21 DIAGNOSIS — Z20822 Contact with and (suspected) exposure to covid-19: Secondary | ICD-10-CM | POA: Diagnosis present

## 2021-05-21 DIAGNOSIS — R7989 Other specified abnormal findings of blood chemistry: Secondary | ICD-10-CM | POA: Diagnosis present

## 2021-05-21 DIAGNOSIS — N1831 Chronic kidney disease, stage 3a: Secondary | ICD-10-CM | POA: Diagnosis present

## 2021-05-21 DIAGNOSIS — I129 Hypertensive chronic kidney disease with stage 1 through stage 4 chronic kidney disease, or unspecified chronic kidney disease: Secondary | ICD-10-CM | POA: Diagnosis present

## 2021-05-21 DIAGNOSIS — Z8249 Family history of ischemic heart disease and other diseases of the circulatory system: Secondary | ICD-10-CM

## 2021-05-21 DIAGNOSIS — R509 Fever, unspecified: Secondary | ICD-10-CM | POA: Diagnosis not present

## 2021-05-21 DIAGNOSIS — E860 Dehydration: Secondary | ICD-10-CM | POA: Diagnosis present

## 2021-05-21 DIAGNOSIS — I10 Essential (primary) hypertension: Secondary | ICD-10-CM | POA: Diagnosis present

## 2021-05-21 DIAGNOSIS — R9431 Abnormal electrocardiogram [ECG] [EKG]: Secondary | ICD-10-CM | POA: Diagnosis present

## 2021-05-21 DIAGNOSIS — E875 Hyperkalemia: Secondary | ICD-10-CM | POA: Diagnosis not present

## 2021-05-21 DIAGNOSIS — R7401 Elevation of levels of liver transaminase levels: Secondary | ICD-10-CM | POA: Diagnosis present

## 2021-05-21 DIAGNOSIS — E1043 Type 1 diabetes mellitus with diabetic autonomic (poly)neuropathy: Secondary | ICD-10-CM | POA: Diagnosis present

## 2021-05-21 DIAGNOSIS — E1065 Type 1 diabetes mellitus with hyperglycemia: Secondary | ICD-10-CM | POA: Diagnosis present

## 2021-05-21 LAB — CBC WITH DIFFERENTIAL/PLATELET
Abs Immature Granulocytes: 0.06 10*3/uL (ref 0.00–0.07)
Basophils Absolute: 0.2 10*3/uL — ABNORMAL HIGH (ref 0.0–0.1)
Basophils Relative: 2 %
Eosinophils Absolute: 0.2 10*3/uL (ref 0.0–0.5)
Eosinophils Relative: 2 %
HCT: 39.3 % (ref 36.0–46.0)
Hemoglobin: 12.8 g/dL (ref 12.0–15.0)
Immature Granulocytes: 1 %
Lymphocytes Relative: 27 %
Lymphs Abs: 2.8 10*3/uL (ref 0.7–4.0)
MCH: 35.2 pg — ABNORMAL HIGH (ref 26.0–34.0)
MCHC: 32.6 g/dL (ref 30.0–36.0)
MCV: 108 fL — ABNORMAL HIGH (ref 80.0–100.0)
Monocytes Absolute: 1 10*3/uL (ref 0.1–1.0)
Monocytes Relative: 10 %
Neutro Abs: 6 10*3/uL (ref 1.7–7.7)
Neutrophils Relative %: 58 %
Platelets: 251 10*3/uL (ref 150–400)
RBC: 3.64 MIL/uL — ABNORMAL LOW (ref 3.87–5.11)
RDW: 13.6 % (ref 11.5–15.5)
WBC: 10.2 10*3/uL (ref 4.0–10.5)
nRBC: 0 % (ref 0.0–0.2)

## 2021-05-21 LAB — BRAIN NATRIURETIC PEPTIDE: B Natriuretic Peptide: 297.4 pg/mL — ABNORMAL HIGH (ref 0.0–100.0)

## 2021-05-21 LAB — BASIC METABOLIC PANEL
Anion gap: >20 — ABNORMAL HIGH (ref 5–15)
Anion gap: 19 — ABNORMAL HIGH (ref 5–15)
Anion gap: 10 (ref 5–15)
Anion gap: 11 (ref 5–15)
BUN: 24 mg/dL — ABNORMAL HIGH (ref 6–20)
BUN: 22 mg/dL — ABNORMAL HIGH (ref 6–20)
BUN: 20 mg/dL (ref 6–20)
BUN: 19 mg/dL (ref 6–20)
CO2: 10 mmol/L — ABNORMAL LOW (ref 22–32)
CO2: 12 mmol/L — ABNORMAL LOW (ref 22–32)
CO2: 18 mmol/L — ABNORMAL LOW (ref 22–32)
CO2: 18 mmol/L — ABNORMAL LOW (ref 22–32)
Calcium: 10 mg/dL (ref 8.9–10.3)
Calcium: 9.3 mg/dL (ref 8.9–10.3)
Calcium: 9.2 mg/dL (ref 8.9–10.3)
Calcium: 9.1 mg/dL (ref 8.9–10.3)
Chloride: 100 mmol/L (ref 98–111)
Chloride: 101 mmol/L (ref 98–111)
Chloride: 107 mmol/L (ref 98–111)
Chloride: 106 mmol/L (ref 98–111)
Creatinine, Ser: 3.07 mg/dL — ABNORMAL HIGH (ref 0.44–1.00)
Creatinine, Ser: 2.83 mg/dL — ABNORMAL HIGH (ref 0.44–1.00)
Creatinine, Ser: 2.4 mg/dL — ABNORMAL HIGH (ref 0.44–1.00)
Creatinine, Ser: 2.17 mg/dL — ABNORMAL HIGH (ref 0.44–1.00)
GFR, Estimated: 19 mL/min — ABNORMAL LOW (ref >60)
GFR, Estimated: 21 mL/min — ABNORMAL LOW (ref >60)
GFR, Estimated: 26 mL/min — ABNORMAL LOW (ref >60)
GFR, Estimated: 29 mL/min — ABNORMAL LOW (ref >60)
Glucose, Bld: 368 mg/dL — ABNORMAL HIGH (ref 70–99)
Glucose, Bld: 293 mg/dL — ABNORMAL HIGH (ref 70–99)
Glucose, Bld: 183 mg/dL — ABNORMAL HIGH (ref 70–99)
Glucose, Bld: 165 mg/dL — ABNORMAL HIGH (ref 70–99)
Potassium: 3 mmol/L — ABNORMAL LOW (ref 3.5–5.1)
Potassium: 2.7 mmol/L — CL (ref 3.5–5.1)
Potassium: 3.2 mmol/L — ABNORMAL LOW (ref 3.5–5.1)
Potassium: 2.8 mmol/L — ABNORMAL LOW (ref 3.5–5.1)
Sodium: 134 mmol/L — ABNORMAL LOW (ref 135–145)
Sodium: 132 mmol/L — ABNORMAL LOW (ref 135–145)
Sodium: 135 mmol/L (ref 135–145)
Sodium: 135 mmol/L (ref 135–145)

## 2021-05-21 LAB — BETA-HYDROXYBUTYRIC ACID
Beta-Hydroxybutyric Acid: >8 mmol/L — ABNORMAL HIGH (ref 0.05–0.27)
Beta-Hydroxybutyric Acid: 2.13 mmol/L — ABNORMAL HIGH (ref 0.05–0.27)

## 2021-05-21 LAB — BLOOD GAS, VENOUS
Acid-base deficit: 13.5 mmol/L — ABNORMAL HIGH (ref 0.0–2.0)
Bicarbonate: 11.8 mmol/L — ABNORMAL LOW (ref 20.0–28.0)
O2 Saturation: 67 %
Patient temperature: 98.6
pCO2, Ven: 26.2 mmHg — ABNORMAL LOW (ref 44.0–60.0)
pH, Ven: 7.276 (ref 7.250–7.430)
pO2, Ven: 40.4 mmHg (ref 32.0–45.0)

## 2021-05-21 LAB — MAGNESIUM
Magnesium: 2 mg/dL (ref 1.7–2.4)
Magnesium: 1.8 mg/dL (ref 1.7–2.4)

## 2021-05-21 LAB — GLUCOSE, CAPILLARY
Glucose-Capillary: 216 mg/dL — ABNORMAL HIGH (ref 70–99)
Glucose-Capillary: 191 mg/dL — ABNORMAL HIGH (ref 70–99)
Glucose-Capillary: 177 mg/dL — ABNORMAL HIGH (ref 70–99)
Glucose-Capillary: 171 mg/dL — ABNORMAL HIGH (ref 70–99)
Glucose-Capillary: 124 mg/dL — ABNORMAL HIGH (ref 70–99)
Glucose-Capillary: 191 mg/dL — ABNORMAL HIGH (ref 70–99)
Glucose-Capillary: 192 mg/dL — ABNORMAL HIGH (ref 70–99)

## 2021-05-21 LAB — RESP PANEL BY RT-PCR (FLU A&B, COVID) ARPGX2
Influenza A by PCR: NEGATIVE
Influenza B by PCR: NEGATIVE
SARS Coronavirus 2 by RT PCR: NEGATIVE

## 2021-05-21 LAB — HEPATIC FUNCTION PANEL
ALT: 122 U/L — ABNORMAL HIGH (ref 0–44)
AST: 192 U/L — ABNORMAL HIGH (ref 15–41)
Albumin: 3.8 g/dL (ref 3.5–5.0)
Alkaline Phosphatase: 299 U/L — ABNORMAL HIGH (ref 38–126)
Bilirubin, Direct: 0.2 mg/dL (ref 0.0–0.2)
Indirect Bilirubin: 1.5 mg/dL — ABNORMAL HIGH (ref 0.3–0.9)
Total Bilirubin: 1.7 mg/dL — ABNORMAL HIGH (ref 0.3–1.2)
Total Protein: 8 g/dL (ref 6.5–8.1)

## 2021-05-21 LAB — I-STAT BETA HCG BLOOD, ED (MC, WL, AP ONLY): I-stat hCG, quantitative: <5 m[IU]/mL (ref <5)

## 2021-05-21 LAB — CBG MONITORING, ED
Glucose-Capillary: 382 mg/dL — ABNORMAL HIGH (ref 70–99)
Glucose-Capillary: 287 mg/dL — ABNORMAL HIGH (ref 70–99)
Glucose-Capillary: 281 mg/dL — ABNORMAL HIGH (ref 70–99)
Glucose-Capillary: 232 mg/dL — ABNORMAL HIGH (ref 70–99)

## 2021-05-21 LAB — LIPASE, BLOOD: Lipase: 100 U/L — ABNORMAL HIGH (ref 11–51)

## 2021-05-21 LAB — HEMOGLOBIN A1C
Hgb A1c MFr Bld: 9.6 % — ABNORMAL HIGH (ref 4.8–5.6)
Mean Plasma Glucose: 228.82 mg/dL

## 2021-05-21 LAB — MRSA NEXT GEN BY PCR, NASAL: MRSA by PCR Next Gen: NOT DETECTED

## 2021-05-21 MED ORDER — PROCHLORPERAZINE EDISYLATE 10 MG/2ML IJ SOLN
5.0000 mg | INTRAMUSCULAR | Status: DC | PRN
  Administered 2021-05-21 – 2021-05-27 (×2): 5 mg via INTRAVENOUS
  Filled 2021-05-21 (×3): qty 2

## 2021-05-21 MED ORDER — SODIUM CHLORIDE 0.9 % IV SOLN
INTRAVENOUS | Status: DC | PRN
  Administered 2021-05-28: 10 mL/h via INTRAVENOUS

## 2021-05-21 MED ORDER — ACETAMINOPHEN 325 MG PO TABS
650.0000 mg | ORAL_TABLET | Freq: Four times a day (QID) | ORAL | Status: DC | PRN
  Administered 2021-05-21 – 2021-05-26 (×4): 650 mg via ORAL
  Filled 2021-05-21 (×4): qty 2

## 2021-05-21 MED ORDER — MORPHINE SULFATE (PF) 2 MG/ML IV SOLN
1.0000 mg | Freq: Once | INTRAVENOUS | Status: AC
  Administered 2021-05-22: 1 mg via INTRAVENOUS
  Filled 2021-05-21: qty 1

## 2021-05-21 MED ORDER — LACTATED RINGERS IV BOLUS
2000.0000 mL | Freq: Once | INTRAVENOUS | Status: AC
  Administered 2021-05-21: 2000 mL via INTRAVENOUS

## 2021-05-21 MED ORDER — POTASSIUM CHLORIDE 10 MEQ/100ML IV SOLN
10.0000 meq | INTRAVENOUS | Status: AC
  Administered 2021-05-21 (×4): 10 meq via INTRAVENOUS
  Filled 2021-05-21 (×3): qty 100

## 2021-05-21 MED ORDER — ACETAMINOPHEN 650 MG RE SUPP: 650.0000 mg | Freq: Four times a day (QID) | RECTAL | Status: DC | PRN

## 2021-05-21 MED ORDER — CHLORHEXIDINE GLUCONATE CLOTH 2 % EX PADS
6.0000 | MEDICATED_PAD | Freq: Every day | CUTANEOUS | Status: DC
  Administered 2021-05-21 – 2021-05-22 (×2): 6 via TOPICAL

## 2021-05-21 MED ORDER — INSULIN REGULAR(HUMAN) IN NACL 100-0.9 UT/100ML-% IV SOLN
INTRAVENOUS | Status: DC
  Administered 2021-05-21: 6.5 [IU]/h via INTRAVENOUS
  Filled 2021-05-21: qty 100

## 2021-05-21 MED ORDER — POTASSIUM CHLORIDE CRYS ER 20 MEQ PO TBCR
40.0000 meq | EXTENDED_RELEASE_TABLET | Freq: Once | ORAL | Status: DC
Start: 2021-05-21 — End: 2021-05-21

## 2021-05-21 MED ORDER — DEXTROSE 50 % IV SOLN
0.0000 mL | INTRAVENOUS | Status: DC | PRN
  Administered 2021-05-22: 25 mL via INTRAVENOUS
  Filled 2021-05-21: qty 50

## 2021-05-21 MED ORDER — GABAPENTIN 100 MG PO CAPS
100.0000 mg | ORAL_CAPSULE | Freq: Two times a day (BID) | ORAL | Status: AC
  Administered 2021-05-21 – 2021-05-22 (×2): 100 mg via ORAL
  Filled 2021-05-21 (×2): qty 1

## 2021-05-21 MED ORDER — MAGNESIUM SULFATE 2 GM/50ML IV SOLN
2.0000 g | Freq: Once | INTRAVENOUS | Status: AC
  Administered 2021-05-21: 2 g via INTRAVENOUS
  Filled 2021-05-21: qty 50

## 2021-05-21 MED ORDER — POTASSIUM CHLORIDE CRYS ER 20 MEQ PO TBCR
40.0000 meq | EXTENDED_RELEASE_TABLET | Freq: Once | ORAL | Status: AC
  Administered 2021-05-21: 40 meq via ORAL
  Filled 2021-05-21: qty 2

## 2021-05-21 MED ORDER — PANTOPRAZOLE SODIUM 40 MG IV SOLR
40.0000 mg | INTRAVENOUS | Status: DC
  Administered 2021-05-21 – 2021-05-23 (×3): 40 mg via INTRAVENOUS
  Filled 2021-05-21 (×3): qty 40

## 2021-05-21 MED ORDER — LACTATED RINGERS IV BOLUS
20.0000 mL/kg | Freq: Once | INTRAVENOUS | Status: AC
  Administered 2021-05-21: 1542 mL via INTRAVENOUS

## 2021-05-21 MED ORDER — DEXTROSE IN LACTATED RINGERS 5 % IV SOLN: INTRAVENOUS | Status: DC

## 2021-05-21 NOTE — ED Notes (Signed)
ED TO INPATIENT HANDOFF REPORT  Name/Age/Gender Anna Garcia 41 y.o. female  Code Status    Code Status Orders  (From admission, onward)           Start     Ordered   05/21/21 1340  Full code  Continuous        05/21/21 1342           Code Status History     Date Active Date Inactive Code Status Order ID Comments User Context   10/12/2020 2024 10/13/2020 2249 Full Code [id number]  Etta Quill, DO ED   07/05/2020 0533 07/07/2020 1638 Full Code [id number]  Vianne Bulls, MD ED       Home/SNF/Other Home  Chief Complaint DKA (diabetic ketoacidosis) ([place]) [E11.10]  Level of Care/Admitting Diagnosis ED Disposition     ED Disposition  Admit   Condition  --   Comment  Hospital Area: [facility] [[id number]]  Level of Care: Stepdown [14]  Admit to SDU based on following criteria: Severe physiological/psychological symptoms:  Any diagnosis requiring assessment & intervention at least every 4 hours on an ongoing basis to obtain desired patient outcomes including stability and rehabilitation  May place patient in observation at [facility] or [place] if equivalent level of care is available:: No  Covid Evaluation: Asymptomatic Screening Protocol (No Symptoms)  Diagnosis: DKA (diabetic ketoacidosis) [facility]) [[id number]]  Admitting Physician: Reubin Milan [[id number]]  Attending Physician: Reubin Milan [[id number]]          Medical History Past Medical History:  Diagnosis Date   Diabetes mellitus    Gastroparesis    Hypertension    Neuropathy     Allergies No Known Allergies  IV Location/Drains/Wounds Patient Lines/Drains/Airways Status     Active Line/Drains/Airways     Name Placement date Placement time Site Days   Peripheral IV 05/21/21 22 G Anterior;Proximal;Right Forearm 05/21/21  1133  Forearm  less than 1   Peripheral IV 05/21/21 20 G Left Antecubital 05/21/21  1356  Antecubital  less than 1             Labs/Imaging Results for orders placed or performed during the hospital encounter of 05/21/21 (from the past 48 hour(s))  CBG monitoring, ED     Status: Abnormal   Collection Time: 05/21/21 10:58 AM  Result Value Ref Range   Glucose-Capillary 382 (H) 70 - 99 mg/dL    Comment: Glucose reference range applies only to samples taken after fasting for at least 8 hours.  Basic metabolic panel     Status: Abnormal   Collection Time: 05/21/21 11:10 AM  Result Value Ref Range   Sodium 134 (L) 135 - 145 mmol/L    Comment: REPEATED TO VERIFY   Potassium 3.0 (L) 3.5 - 5.1 mmol/L   Chloride 100 98 - 111 mmol/L    Comment: REPEATED TO VERIFY   CO2 10 (L) 22 - 32 mmol/L   Glucose, Bld 368 (H) 70 - 99 mg/dL    Comment: Glucose reference range applies only to samples taken after fasting for at least 8 hours.   BUN 24 (H) 6 - 20 mg/dL   Creatinine, Ser 3.07 (H) 0.44 - 1.00 mg/dL   Calcium 10.0 8.9 - 10.3 mg/dL    Comment: REPEATED TO VERIFY   GFR, Estimated 19 (L) >60 mL/min    Comment: (NOTE) Calculated using the CKD-EPI Creatinine Equation (2021)    Anion gap >20 (H) 5 - 15  Comment: REPEATED TO VERIFY Performed at [facility], [place] [address], [place], [place] [address]   CBC with Differential     Status: Abnormal   Collection Time: 05/21/21 11:10 AM  Result Value Ref Range   WBC 10.2 4.0 - 10.5 K/uL   RBC 3.64 (L) 3.87 - 5.11 MIL/uL   Hemoglobin 12.8 12.0 - 15.0 g/dL   HCT 39.3 36.0 - 46.0 %   MCV 108.0 (H) 80.0 - 100.0 fL   MCH 35.2 (H) 26.0 - 34.0 pg   MCHC 32.6 30.0 - 36.0 g/dL   RDW 13.6 11.5 - 15.5 %   Platelets 251 150 - 400 K/uL   nRBC 0.0 0.0 - 0.2 %   Neutrophils Relative % 58 %   Neutro Abs 6.0 1.7 - 7.7 K/uL   Lymphocytes Relative 27 %   Lymphs Abs 2.8 0.7 - 4.0 K/uL   Monocytes Relative 10 %   Monocytes Absolute 1.0 0.1 - 1.0 K/uL   Eosinophils Relative 2 %   Eosinophils Absolute 0.2 0.0 - 0.5 K/uL   Basophils Relative 2 %   Basophils  Absolute 0.2 (H) 0.0 - 0.1 K/uL   Immature Granulocytes 1 %   Abs Immature Granulocytes 0.06 0.00 - 0.07 K/uL    Comment: Performed at [facility], [place] [address] [place], [place] [address]  Brain natriuretic peptide     Status: Abnormal   Collection Time: 05/21/21 11:10 AM  Result Value Ref Range   B Natriuretic Peptide 297.4 (H) 0.0 - 100.0 pg/mL    Comment: Performed at [facility], [place] [address], [place], [place] [address]  Magnesium     Status: None   Collection Time: 05/21/21 11:10 AM  Result Value Ref Range   Magnesium 2.0 1.7 - 2.4 mg/dL    Comment: Performed at [facility], [place] [address], [place], [place] [address]  Hepatic function panel     Status: Abnormal   Collection Time: 05/21/21 11:10 AM  Result Value Ref Range   Total Protein 8.0 6.5 - 8.1 g/dL   Albumin 3.8 3.5 - 5.0 g/dL   AST 192 (H) 15 - 41 U/L   ALT 122 (H) 0 - 44 U/L   Alkaline Phosphatase 299 (H) 38 - 126 U/L   Total Bilirubin 1.7 (H) 0.3 - 1.2 mg/dL   Bilirubin, Direct 0.2 0.0 - 0.2 mg/dL   Indirect Bilirubin 1.5 (H) 0.3 - 0.9 mg/dL    Comment: Performed at [facility], [place] [address], [place], [place] [address]  Lipase, blood     Status: Abnormal   Collection Time: 05/21/21 11:10 AM  Result Value Ref Range   Lipase 100 (H) 11 - 51 U/L    Comment: Performed at [facility], [place] [address], [place], [place] [address]  Beta-hydroxybutyric acid     Status: Abnormal   Collection Time: 05/21/21 11:11 AM  Result Value Ref Range   Beta-Hydroxybutyric Acid >8.00 (H) 0.05 - 0.27 mmol/L    Comment: RESULTS CONFIRMED BY MANUAL DILUTION Performed at [facility] [address], [place], [place] [address]   Resp Panel by RT-PCR (Flu A&B, Covid) Nasopharyngeal Swab     Status: None   Collection Time: 05/21/21 11:41 AM   Specimen: Nasopharyngeal Swab; Nasopharyngeal(NP) swabs in vial transport  medium  Result Value Ref Range   SARS Coronavirus 2 by RT PCR NEGATIVE NEGATIVE    Comment: (NOTE) SARS-CoV-2 target  nucleic acids are NOT DETECTED.  The SARS-CoV-2 RNA is generally detectable in upper respiratory specimens during the acute phase of infection. The lowest concentration of SARS-CoV-2 viral copies this assay can detect is 138 copies/mL. A negative result does not preclude SARS-Cov-2 infection and should not be used as the sole basis for treatment or other patient management decisions. A negative result may occur with  improper specimen collection/handling, submission of specimen other than nasopharyngeal swab, presence of viral mutation(s) within the areas targeted by this assay, and inadequate number of viral copies(<138 copies/mL). A negative result must be combined with clinical observations, patient history, and epidemiological information. The expected result is Negative.  Fact Sheet for Patients:  EntrepreneurPulse.com.au  Fact Sheet for Healthcare Providers:  IncredibleEmployment.be  This test is no t yet approved or cleared by the [place] FDA and  has been authorized for detection and/or diagnosis of SARS-CoV-2 by FDA under an Emergency Use Authorization (EUA). This EUA will remain  in effect (meaning this test can be used) for the duration of the COVID-19 declaration under Section 564(b)(1) of the Act, 21 U.S.C.section 360bbb-3(b)(1), unless the authorization is terminated  or revoked sooner.       Influenza A by PCR NEGATIVE NEGATIVE   Influenza B by PCR NEGATIVE NEGATIVE    Comment: (NOTE) The Xpert Xpress SARS-CoV-2/FLU/RSV plus assay is intended as an aid in the diagnosis of influenza from Nasopharyngeal swab specimens and should not be used as a sole basis for treatment. Nasal washings and aspirates are unacceptable for Xpert Xpress SARS-CoV-2/FLU/RSV testing.  Fact Sheet for  Patients: EntrepreneurPulse.com.au  Fact Sheet for Healthcare Providers: IncredibleEmployment.be  This test is not yet approved or cleared by the [place] FDA and has been authorized for detection and/or diagnosis of SARS-CoV-2 by FDA under an Emergency Use Authorization (EUA). This EUA will remain in effect (meaning this test can be used) for the duration of the COVID-19 declaration under Section 564(b)(1) of the Act, 21 U.S.C. section 360bbb-3(b)(1), unless the authorization is terminated or revoked.  Performed at [facility], [place] [address] [place],  [address]   I-Stat Beta hCG blood, ED ([facility], [facility], [facility] only)     Status: None   Collection Time: 05/21/21 12:00 PM  Result Value Ref Range   I-stat hCG, quantitative <5.0 <5 mIU/mL   Comment 3            Comment:   GEST. AGE      CONC.  (mIU/mL)   <=1 WEEK        5 - 50     2 WEEKS       50 - 500     3 WEEKS       100 - 10,000     4 WEEKS     1,000 - 30,000        FEMALE AND NON-PREGNANT FEMALE:     LESS THAN 5 mIU/mL   CBG monitoring, ED     Status: Abnormal   Collection Time: 05/21/21  1:43 PM  Result Value Ref Range   Glucose-Capillary 287 (H) 70 - 99 mg/dL    Comment: Glucose reference range applies only to samples taken after fasting for at least 8 hours.  Blood gas, venous     Status: Abnormal   Collection Time: 05/21/21  1:45 PM  Result Value Ref Range   pH, Ven 7.276 7.250 - 7.430   pCO2, Ven 26.2 (L) 44.0 - 60.0 mmHg  pO2, Ven 40.4 32.0 - 45.0 mmHg   Bicarbonate 11.8 (L) 20.0 - 28.0 mmol/L   Acid-base deficit 13.5 (H) 0.0 - 2.0 mmol/L   O2 Saturation 67.0 %   Patient temperature 98.6     Comment: Performed at [facility], [place] [address] [place], [place] [address]  Basic metabolic panel     Status: Abnormal   Collection Time: 05/21/21  1:45 PM  Result Value Ref Range   Sodium 132 (L) 135 - 145 mmol/L   Potassium 2.7 (LL) 3.5 -  5.1 mmol/L    Comment: CRITICAL RESULT CALLED TO, READ BACK BY AND VERIFIED WITH: BRATU,D. EMTP AT 1430 05/21/21 MULLINS,T    Chloride 101 98 - 111 mmol/L   CO2 12 (L) 22 - 32 mmol/L   Glucose, Bld 293 (H) 70 - 99 mg/dL    Comment: Glucose reference range applies only to samples taken after fasting for at least 8 hours.   BUN 22 (H) 6 - 20 mg/dL   Creatinine, Ser 2.83 (H) 0.44 - 1.00 mg/dL   Calcium 9.3 8.9 - 10.3 mg/dL   GFR, Estimated 21 (L) >60 mL/min    Comment: (NOTE) Calculated using the CKD-EPI Creatinine Equation (2021)    Anion gap 19 (H) 5 - 15    Comment: Performed at [facility], [place] [address] [place], [place] [address]  Magnesium     Status: None   Collection Time: 05/21/21  1:45 PM  Result Value Ref Range   Magnesium 1.8 1.7 - 2.4 mg/dL    Comment: Performed at [facility], [place] [address] [place], [place] [address]  CBG monitoring, ED     Status: Abnormal   Collection Time: 05/21/21  2:47 PM  Result Value Ref Range   Glucose-Capillary 281 (H) 70 - 99 mg/dL    Comment: Glucose reference range applies only to samples taken after fasting for at least 8 hours.   DG Chest Port 1 View  Result Date: 05/21/2021 CLINICAL DATA:  Shortness of breath EXAM: PORTABLE CHEST 1 VIEW COMPARISON:  Chest x-ray dated July 06, 2020 FINDINGS: The heart size and mediastinal contours are within normal limits. Both lungs are clear. The visualized skeletal structures are unremarkable. IMPRESSION: No active disease. Electronically Signed   By: Yetta Glassman M.D.   On: 05/21/2021 12:19    Pending Labs Unresulted Labs (From admission, onward)     Start     Ordered   05/22/21 0500  CBC  Tomorrow morning,   R        05/21/21 1342   05/22/21 0500  Comprehensive metabolic panel  Tomorrow morning,   R        05/21/21 1342   05/21/21 7902  Basic metabolic panel  (Diabetes Ketoacidosis (DKA))  STAT Now then every 4 hours ,   STAT      05/21/21 1338    05/21/21 1339  Hemoglobin A1c  (Diabetes Ketoacidosis (DKA))  Add-on,   AD       Comments: To assess prior glycemic control.    05/21/21 1338   05/21/21 1111  Urinalysis, Routine w reflex microscopic  ONCE - STAT,   STAT        05/21/21 1110            Vitals/Pain Today's Vitals   05/21/21 1300 05/21/21 1315 05/21/21 1330 05/21/21 1350  BP: 114/72 112/79 114/71   Pulse: 98 98 96   Resp: 16 15 16  SpO2: 100% 100% 100%   Weight:    170 lb (77.1 kg)    Isolation Precautions No active isolations  Medications Medications  insulin regular, human (MYXREDLIN) 100 units/ 100 mL infusion (6.5 Units/hr Intravenous New Bag/Given 05/21/21 1417)  dextrose 5 % in lactated ringers infusion ( Intravenous New Bag/Given 05/21/21 1545)  dextrose 50 % solution 0-50 mL (has no administration in time range)  potassium chloride 10 mEq in 100 mL IVPB (10 mEq Intravenous New Bag/Given 05/21/21 1536)  acetaminophen (TYLENOL) tablet 650 mg (has no administration in time range)    Or  acetaminophen (TYLENOL) suppository 650 mg (has no administration in time range)  prochlorperazine (COMPAZINE) injection 5 mg (has no administration in time range)  pantoprazole (PROTONIX) injection 40 mg (40 mg Intravenous Given 05/21/21 1439)  magnesium sulfate IVPB 2 g 50 mL (has no administration in time range)  lactated ringers bolus 2,000 mL (2,000 mLs Intravenous New Bag/Given 05/21/21 1135)  lactated ringers bolus 1,542 mL (1,542 mLs Intravenous New Bag/Given 05/21/21 1423)    Mobility walks with person assist

## 2021-05-21 NOTE — ED Triage Notes (Addendum)
Pt dropped off by friend.  C/O palpitations and sob.  C/o chest tightness.   Pt reports this has happened in the past about several months ago per patient.   A/Ox4 Pt in wheelchair    Hx: Gastroparesis and Dm 1 (cbg 400s at home). Pt states she took insulin around 8am.

## 2021-05-21 NOTE — H&P (Signed)
History and Physical    Anna Garcia YDX:[id number] DOB: 07/04/1980 DOA: 05/21/2021  PCP: Day, [name], MD   Patient coming from: Home.  I have personally briefly reviewed patient's old medical records in [place]  Chief Complaint: Nausea and vomiting.  HPI: Anna Garcia is a 41 y.o. female with medical history significant of type 1 diabetes, gastroparesis, hypertension, diabetic peripheral neuropathy, overweight, tobacco use who is coming to the emergency department due to palpitations, shortness of breath and chest tightness.  She has also been having occasional lightheadedness, mostly positional, multiple episodes of nausea and emesis.  She has been wheezing at times and using her albuterol MDI.  She stated that she has been using her insulin as she should.  Denied any dietary indiscretions.  Denied diarrhea, constipation, melena or hematochezia.  No flank pain, dysuria, frequency or hematuria.  Denied polyuria, polydipsia, polyphagia or blurred vision.  ED Course: Initial vital signs are pulse 115, respirations 16, BP 108/66 and O2 sat 100% on room air.  Patient was given 3600 mL of LR bolus, potassium replacement IVPB and was started on an insulin infusion.  Lab work: CBC is her white count of 10.2, hemoglobin 12.8 g/dL and platelets 251.  BNP 297.4 pg/mL, lipase 100 units/L, BHA was more than 8.0 mmol/L.  CMP showed sodium 134, potassium 3.0, chloride 100 CO2 10 mmol/L.  Anion gap was more than 20.  Glucose 368, BUN 24 creatinine 3.07 mg/dL.  Venous blood gas showed a pH of 7.27, PCO2 of 26.2 and PO2 of 40.4 mmHg.  Bicarbonate was 11.8 and acid-base deficit 13.5 mmol/L.  Imaging: 1 view chest radiograph did not have any active cardiopulmonary disease.  Review of Systems: As per HPI otherwise all other systems reviewed and are negative.  Past Medical History:  Diagnosis Date   Diabetes mellitus    Gastroparesis    Hypertension    Neuropathy    History reviewed. No pertinent  surgical history.  Social History  reports that she has quit smoking. Her smoking use included cigarettes. She smoked an average of .5 packs per day. She has never used smokeless tobacco. She reports that she does not drink alcohol and does not use drugs.  No Known Allergies  Family History  Problem Relation Age of Onset   Hypertension Mother    Dementia Mother    Heart attack Father    Hypertension Father    Heart disease Father    Hypertension Brother    Stroke Maternal Grandfather    Prior to Admission medications   Medication Sig Start Date End Date Taking? Authorizing Provider  albuterol (VENTOLIN HFA) 108 (90 Base) MCG/ACT inhaler Inhale 2 puffs into the lungs every 6 (six) hours as needed for wheezing or shortness of breath.   Yes [provider]  buprenorphine (BUTRANS) 5 MCG/HR PTWK Place 1 patch onto the skin once a week. 04/19/21  Yes [provider]  cholecalciferol (VITAMIN D3) 25 MCG (1000 UNIT) tablet Take 1,000 Units by mouth daily.   Yes [provider]  DEXILANT 60 MG capsule Take 60 mg by mouth daily. 05/24/19  Yes [provider]  famotidine (PEPCID) 40 MG tablet Take 40 mg by mouth 2 (two) times daily. 05/28/19  Yes [provider]  insulin glargine (LANTUS SOLOSTAR) 100 UNIT/ML Solostar Pen Inject 12 Units into the skin in the morning and at bedtime. Patient taking differently: Inject 14 Units into the skin daily. 07/07/20  Yes Mercy Riding,  MD  insulin glulisine (APIDRA SOLOSTAR) 100 UNIT/ML Solostar Pen Inject 1-15 Units into the skin See admin instructions. Changes as per carb intake as needed high CBG 07/13/20  Yes [provider]  levalbuterol (XOPENEX HFA) 45 MCG/ACT inhaler Inhale 1 puff into the lungs every 6 (six) hours as needed for wheezing.   Yes [provider]  losartan (COZAAR) 25 MG tablet Take 1 tablet (25 mg total) by mouth daily. 07/14/20  Yes Mercy Riding, MD  mometasone-formoterol  (DULERA) 100-5 MCG/ACT AERO Inhale 2 puffs into the lungs 2 (two) times daily. 07/07/20  Yes Mercy Riding, MD  montelukast (SINGULAIR) 10 MG tablet Take 10 mg by mouth at bedtime. 05/24/19  Yes [provider]  Multiple Vitamins-Minerals (MULTIVITAMIN WITH MINERALS) tablet Take 1 tablet by mouth daily.   Yes [provider]  rosuvastatin (CRESTOR) 20 MG tablet Take 20 mg by mouth daily. 05/24/19  Yes [provider]  bethanechol (URECHOLINE) 5 MG tablet Take 5 mg by mouth 3 (three) times daily. Patient not taking: Reported on 05/21/2021 08/28/20   [provider]  fluconazole (DIFLUCAN) 150 MG tablet Take 150 mg by mouth once. 05/21/21   [provider]  insulin aspart (NOVOLOG) 100 UNIT/ML injection Inject 3 units three times a day with meals if you eat over 50% of your meals. In addition, use sliding scale as follows: CBG 70 - 120: 0 units CBG 121 - 150: 2 units CBG 151 - 200: 3 units CBG 201 - 250: 5 units CBG 251 - 300: 8 units CBG 301 - 350: 11 units CBG 351 - 400: 15 units CBG > 400: call MD and obtain STAT lab verification Patient not taking: No sig reported 07/07/20 10/13/20  Mercy Riding, MD   Physical Exam: Vitals:   05/21/21 1300 05/21/21 1315 05/21/21 1330 05/21/21 1350  BP: 114/72 112/79 114/71   Pulse: 98 98 96   Resp: 16 15 16    SpO2: 100% 100% 100%   Weight:    77.1 kg   Constitutional: Looks acutely ill.  NAD, calm, comfortable Eyes: PERRL, lids and conjunctivae normal.  Bilateral conjunctival injection. ENMT: Mucous membranes are dry.  Posterior pharynx clear of any exudate or lesions. Neck: normal, supple, no masses, no thyromegaly Respiratory: clear to auscultation bilaterally, no wheezing, no crackles. Normal respiratory effort. No accessory muscle use.  Cardiovascular: Sinus tachycardia in the low 100s, no murmurs / rubs / gallops. No extremity edema. 2+ pedal pulses. No carotid bruits.  Abdomen: Obese, no distention.  Soft, no  tenderness, no masses palpated. No hepatosplenomegaly. Bowel sounds positive.  Musculoskeletal: no clubbing / cyanosis. Good ROM, no contractures. Normal muscle tone.  Skin: no acute rashes, lesions, ulcers on very limited dermatological examination Neurologic: CN 2-12 grossly intact. Sensation intact, DTR normal. Strength 5/5 in all 4.  Psychiatric: Normal judgment and insight. Alert and oriented x 3. Normal mood.   Labs on Admission: I have personally reviewed following labs and imaging studies  CBC: Recent Labs  Lab 05/21/21 1110  WBC 10.2  NEUTROABS 6.0  HGB 12.8  HCT 39.3  MCV 108.0*  PLT 397    Basic Metabolic Panel: Recent Labs  Lab 05/21/21 1110  NA 134*  K 3.0*  CL 100  CO2 10*  GLUCOSE 368*  BUN 24*  CREATININE 3.07*  CALCIUM 10.0  MG 2.0    GFR: CrCl cannot be calculated (Unknown ideal weight.).  Liver Function Tests: Recent Labs  Lab 05/21/21 1110  AST 192*  ALT 122*  ALKPHOS 299*  BILITOT 1.7*  PROT 8.0  ALBUMIN 3.8   Radiological Exams on Admission: DG Chest Port 1 View  Result Date: 05/21/2021 CLINICAL DATA:  Shortness of breath EXAM: PORTABLE CHEST 1 VIEW COMPARISON:  Chest x-ray dated July 06, 2020 FINDINGS: The heart size and mediastinal contours are within normal limits. Both lungs are clear. The visualized skeletal structures are unremarkable. IMPRESSION: No active disease. Electronically Signed   By: Yetta Glassman M.D.   On: 05/21/2021 12:19    EKG: Independently reviewed.  Vent. rate 114 BPM PR interval 112 ms QRS duration 74 ms QT/QTcB 454/625 ms P-R-T axes * 88 72 Long QTc Sinus tachycardia Nonspecific T wave abnormality Prolonged QT Abnormal ECG  Assessment/Plan Principal Problem:   DKA (diabetic ketoacidosis) (HCC) In the setting of   Type 1 diabetes mellitus ([place]) Observation/stepdown. Keep NPO. Continue IV fluids. Continue insulin infusion. Monitor and replace electrolytes as needed. Transition to SQ insulin  per Endo tool. Consult diabetes coordinator.  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 Compazine as needed. Avoid hypotension. Avoid nephrotoxins. Monitor intake and output. Follow-up electrolytes and renal function in AM.    Prolonged QT interval Secondary to hypokalemia. Replete potassium. Supplement with magnesium. Avoid QT prolonging meds. Follow-up EKG and electrolytes.    LFT elevation In the setting of dehydration. Continue IV fluids. Follow-up LFTs in AM. Further work-up depending on results.    Hypertension Hold losartan. Monitor blood pressure. Follow renal function electrolytes.    Hypokalemia Replacing. Follow-up potassium level.    DVT prophylaxis: SCDs. Code Status:   Full code. Family Communication:   Disposition Plan:   Patient is from:  Home.  Anticipated DC to:  Home.  Anticipated DC date:  05/22/2021.  Anticipated DC barriers: Clinical status.  Consults called:   Admission status:  Observation/stepdown.  Severity of Illness: High severity in the setting of DKA.  Reubin Milan MD [facility]  How to contact the [facility] Attending or Consulting provider [place] or covering provider during after hours [place], for this patient?   Check the care team in [facility] and look for a) attending/consulting [facility] provider listed and b) the [facility] team listed Log into [email or web address] and use [place]'s universal password to access. If you do not have the password, please contact the hospital operator. Locate the [facility] provider you are looking for under [facility] and page to a number that you can be directly reached. If you still have difficulty reaching the provider, please page the [facility] (Director on Call) for the Hospitalists listed  on amion for assistance.  05/21/2021, 1:52 PM   Please document was prepared in Dragon voice recognition software and may contain some unintended transcription errors.

## 2021-05-21 NOTE — ED Provider Notes (Signed)
[facility] DEPT Provider Note   CSN: [id number] Arrival date & time: 05/21/21  1052     History  Chief Complaint  Patient presents with   Palpitations   Shortness of Breath    Anna Garcia is a 41 y.o. female.  41 yo F with a chief complaints of difficulty breathing.  She tells me this is an ongoing issue for her and has gotten worse over the past 48 hours or so.  No obvious cough or fever.  She has had some decreased oral intake that she thinks is secondary to her gastroparesis.  Her blood sugars been up a little bit but she does think that she has been taking her medications.   Palpitations Associated symptoms: shortness of breath   Shortness of Breath     Home Medications Prior to Admission medications   Medication Sig Start Date End Date Taking? Authorizing Provider  albuterol (VENTOLIN HFA) 108 (90 Base) MCG/ACT inhaler Inhale 2 puffs into the lungs every 6 (six) hours as needed for wheezing or shortness of breath.    [provider]  bethanechol (URECHOLINE) 5 MG tablet Take 5 mg by mouth 3 (three) times daily. 08/28/20   [provider]  DEXILANT 60 MG capsule Take 60 mg by mouth daily. 05/24/19   [provider]  famotidine (PEPCID) 40 MG tablet Take 40 mg by mouth 2 (two) times daily. 05/28/19   [provider]  insulin glargine (LANTUS SOLOSTAR) 100 UNIT/ML Solostar Pen Inject 12 Units into the skin in the morning and at bedtime. 07/07/20   Mercy Riding, MD  insulin glulisine (APIDRA SOLOSTAR) 100 UNIT/ML Solostar Pen Inject 1-10 Units into the skin See admin instructions. Changes as per carb intake as needed 07/13/20   [provider]  levalbuterol (XOPENEX HFA) 45 MCG/ACT inhaler Inhale 1 puff into the lungs every 6 (six) hours as needed for wheezing.    [provider]  losartan (COZAAR) 25 MG tablet Take 1 tablet (25 mg total) by mouth daily. 07/14/20   Mercy Riding, MD   mometasone-formoterol (DULERA) 100-5 MCG/ACT AERO Inhale 2 puffs into the lungs 2 (two) times daily. 07/07/20   Mercy Riding, MD  montelukast (SINGULAIR) 10 MG tablet Take 10 mg by mouth at bedtime. 05/24/19   [provider]  rosuvastatin (CRESTOR) 20 MG tablet Take 20 mg by mouth daily. 05/24/19   [provider]  insulin aspart (NOVOLOG) 100 UNIT/ML injection Inject 3 units three times a day with meals if you eat over 50% of your meals. In addition, use sliding scale as follows: CBG 70 - 120: 0 units CBG 121 - 150: 2 units CBG 151 - 200: 3 units CBG 201 - 250: 5 units CBG 251 - 300: 8 units CBG 301 - 350: 11 units CBG 351 - 400: 15 units CBG > 400: call MD and obtain STAT lab verification Patient not taking: No sig reported 07/07/20 10/13/20  Mercy Riding, MD      Allergies    Patient has no known allergies.    Review of Systems   Review of Systems  Respiratory:  Positive for shortness of breath.   Cardiovascular:  Positive for palpitations.   Physical Exam Updated Vital Signs BP 114/71    Pulse 96    Resp 16    LMP  (LMP Unknown)    SpO2 100%  Physical Exam Vitals and nursing note reviewed.  Constitutional:  General: She is not in acute distress.    Appearance: She is well-developed. She is not diaphoretic.  HENT:     Head: Normocephalic and atraumatic.  Eyes:     Pupils: Pupils are equal, round, and reactive to light.  Cardiovascular:     Rate and Rhythm: Regular rhythm. Tachycardia present.     Heart sounds: No murmur heard.   No friction rub. No gallop.  Pulmonary:     Effort: Pulmonary effort is normal. Tachypnea present.     Breath sounds: No wheezing or rales.  Abdominal:     General: There is no distension.     Palpations: Abdomen is soft.     Tenderness: There is no abdominal tenderness.  Musculoskeletal:        General: No tenderness.     Cervical back: Normal range of motion and neck supple.  Skin:    General: Skin is warm and dry.   Neurological:     Mental Status: She is alert and oriented to person, place, and time.  Psychiatric:        Behavior: Behavior normal.    ED Results / Procedures / Treatments   Labs (all labs ordered are listed, but only abnormal results are displayed) Labs Reviewed  BASIC METABOLIC PANEL - Abnormal; Notable for the following components:      Result Value   Sodium 134 (*)    Potassium 3.0 (*)    CO2 10 (*)    Glucose, Bld 368 (*)    BUN 24 (*)    Creatinine, Ser 3.07 (*)    GFR, Estimated 19 (*)    Anion gap >20 (*)    All other components within normal limits  CBC WITH DIFFERENTIAL/PLATELET - Abnormal; Notable for the following components:   RBC 3.64 (*)    MCV 108.0 (*)    MCH 35.2 (*)    Basophils Absolute 0.2 (*)    All other components within normal limits  BRAIN NATRIURETIC PEPTIDE - Abnormal; Notable for the following components:   B Natriuretic Peptide 297.4 (*)    All other components within normal limits  HEPATIC FUNCTION PANEL - Abnormal; Notable for the following components:   AST 192 (*)    ALT 122 (*)    Alkaline Phosphatase 299 (*)    Total Bilirubin 1.7 (*)    Indirect Bilirubin 1.5 (*)    All other components within normal limits  LIPASE, BLOOD - Abnormal; Notable for the following components:   Lipase 100 (*)    All other components within normal limits  CBG MONITORING, ED - Abnormal; Notable for the following components:   Glucose-Capillary 382 (*)    All other components within normal limits  RESP PANEL BY RT-PCR (FLU A&B, COVID) ARPGX2  MAGNESIUM  URINALYSIS, ROUTINE W REFLEX MICROSCOPIC  BETA-HYDROXYBUTYRIC ACID  BASIC METABOLIC PANEL  BASIC METABOLIC PANEL  BASIC METABOLIC PANEL  BASIC METABOLIC PANEL  BETA-HYDROXYBUTYRIC ACID  BETA-HYDROXYBUTYRIC ACID  CBC WITH DIFFERENTIAL/PLATELET  URINALYSIS, ROUTINE W REFLEX MICROSCOPIC  BLOOD GAS, VENOUS  I-STAT BETA HCG BLOOD, ED ([facility], [facility], [facility] ONLY)  I-STAT CHEM 8, ED  CBG MONITORING, ED     EKG EKG Interpretation  Date/Time:  Monday May 21 2021 11:02:40 EST Ventricular Rate:  114 PR Interval:  112 QRS Duration: 74 QT Interval:  454 QTC Calculation: 625 R Axis:   88 Text Interpretation: Long QTc Sinus tachycardia Nonspecific T wave abnormality Prolonged QT Abnormal ECG When compared with ECG of 12-Oct-2020  18:25, PREVIOUS ECG IS PRESENT Otherwise no significant change Confirmed by Deno Etienne [phone]) on 05/21/2021 11:28:46 AM  Radiology DG Chest Port 1 View  Result Date: 05/21/2021 CLINICAL DATA:  Shortness of breath EXAM: PORTABLE CHEST 1 VIEW COMPARISON:  Chest x-ray dated July 06, 2020 FINDINGS: The heart size and mediastinal contours are within normal limits. Both lungs are clear. The visualized skeletal structures are unremarkable. IMPRESSION: No active disease. Electronically Signed   By: Yetta Glassman M.D.   On: 05/21/2021 12:19    Procedures Procedures    Medications Ordered in ED Medications  lactated ringers bolus 20 mL/kg (has no administration in time range)  lactated ringers bolus 2,000 mL (2,000 mLs Intravenous New Bag/Given 05/21/21 1135)    ED Course/ Medical Decision Making/ A&P                           Medical Decision Making Amount and/or Complexity of Data Reviewed Labs: ordered. Radiology: ordered.   Patient is a 41 y.o. female with a cc of difficulty breathing.  Going on for some time but worsening over the past couple days.  On exam the patient is tachypneic with clear lung sounds is hyperglycemic here as a history of recurrent episodes of diabetic ketoacidosis.  There is high on my differential.  She denies any obvious infectious symptoms.  No chest discomfort with this.  EKG without ischemia.  I suspect probably noncompliance with patient states compliant with her medications.  We will obtain a laboratory evaluation.  IV fluids.  Reassess.  Significant PMH of gastroparesis.  I reviewed the patients chart and is seen at pain  management for fibromyalgia.  Most recent admission to the hospital was for diabetic ketoacidosis with a similar presentation..  I independently interpreted the patients labs and imaging patient's lab work is consistent with diabetic ketoacidosis.  She has an elevated blood sugar with an anion gap of greater than 20 with a bicarb of 10.  We will start her on a insulin infusion.  She is not pregnant.  COVID-negative.  Chest x-ray viewed by me without focal infiltrate or pneumothorax.  Will discuss with medicine for admission.  On my reassessment the patient is much more alert.  She is feeling mildly better.  CRITICAL CARE Performed by: Cecilio Asper   Total critical care time: 35 minutes  Critical care time was exclusive of separately billable procedures and treating other patients.  Critical care was necessary to treat or prevent imminent or life-threatening deterioration.  Critical care was time spent personally by me on the following activities: development of treatment plan with patient and/or surrogate as well as nursing, discussions with consultants, evaluation of patient's response to treatment, examination of patient, obtaining history from patient or surrogate, ordering and performing treatments and interventions, ordering and review of laboratory studies, ordering and review of radiographic studies, pulse oximetry and re-evaluation of patient's condition.   The patients results and plan were reviewed and discussed.   Any x-rays performed were independently reviewed by myself.   Differential diagnosis were considered with the presenting HPI.  Medications  lactated ringers bolus 20 mL/kg (has no administration in time range)  lactated ringers bolus 2,000 mL (2,000 mLs Intravenous New Bag/Given 05/21/21 1135)    Vitals:   05/21/21 1245 05/21/21 1300 05/21/21 1315 05/21/21 1330  BP: 104/73 114/72 112/79 114/71  Pulse: (!) 102 98 98 96  Resp: 18 16 15 16   SpO2: 100% 100% 100%  100%    Final diagnoses:  Type 1 diabetes mellitus with ketoacidosis without coma ([place])    Admission/ observation were discussed with the admitting physician, patient and/or family and they are comfortable with the plan.          Final Clinical Impression(s) / ED Diagnoses Final diagnoses:  Type 1 diabetes mellitus with ketoacidosis without coma [facility])    Rx / DC Orders ED Discharge Orders     None         Deno Etienne, DO 05/21/21 1332

## 2021-05-21 NOTE — ED Notes (Signed)
Unsuccessful IV insertion attempt in L AC. 22g.

## 2021-05-22 ENCOUNTER — Inpatient Hospital Stay (HOSPITAL_COMMUNITY): Payer: Medicare Other

## 2021-05-22 DIAGNOSIS — N9489 Other specified conditions associated with female genital organs and menstrual cycle: Secondary | ICD-10-CM | POA: Diagnosis not present

## 2021-05-22 DIAGNOSIS — Z8249 Family history of ischemic heart disease and other diseases of the circulatory system: Secondary | ICD-10-CM | POA: Diagnosis not present

## 2021-05-22 DIAGNOSIS — Z794 Long term (current) use of insulin: Secondary | ICD-10-CM | POA: Diagnosis not present

## 2021-05-22 DIAGNOSIS — I5031 Acute diastolic (congestive) heart failure: Secondary | ICD-10-CM | POA: Diagnosis not present

## 2021-05-22 DIAGNOSIS — N1831 Chronic kidney disease, stage 3a: Secondary | ICD-10-CM

## 2021-05-22 DIAGNOSIS — E1042 Type 1 diabetes mellitus with diabetic polyneuropathy: Secondary | ICD-10-CM | POA: Diagnosis present

## 2021-05-22 DIAGNOSIS — Z79899 Other long term (current) drug therapy: Secondary | ICD-10-CM | POA: Diagnosis not present

## 2021-05-22 DIAGNOSIS — R7989 Other specified abnormal findings of blood chemistry: Secondary | ICD-10-CM | POA: Diagnosis present

## 2021-05-22 DIAGNOSIS — Z87891 Personal history of nicotine dependence: Secondary | ICD-10-CM | POA: Diagnosis not present

## 2021-05-22 DIAGNOSIS — I129 Hypertensive chronic kidney disease with stage 1 through stage 4 chronic kidney disease, or unspecified chronic kidney disease: Secondary | ICD-10-CM | POA: Diagnosis present

## 2021-05-22 DIAGNOSIS — E1010 Type 1 diabetes mellitus with ketoacidosis without coma: Secondary | ICD-10-CM | POA: Diagnosis present

## 2021-05-22 DIAGNOSIS — N179 Acute kidney failure, unspecified: Secondary | ICD-10-CM | POA: Diagnosis present

## 2021-05-22 DIAGNOSIS — E1022 Type 1 diabetes mellitus with diabetic chronic kidney disease: Secondary | ICD-10-CM | POA: Diagnosis present

## 2021-05-22 DIAGNOSIS — E663 Overweight: Secondary | ICD-10-CM

## 2021-05-22 DIAGNOSIS — R509 Fever, unspecified: Secondary | ICD-10-CM | POA: Diagnosis not present

## 2021-05-22 DIAGNOSIS — R000 Tachycardia, unspecified: Secondary | ICD-10-CM | POA: Diagnosis present

## 2021-05-22 DIAGNOSIS — N764 Abscess of vulva: Secondary | ICD-10-CM | POA: Diagnosis present

## 2021-05-22 DIAGNOSIS — E876 Hypokalemia: Secondary | ICD-10-CM | POA: Diagnosis present

## 2021-05-22 DIAGNOSIS — Z20822 Contact with and (suspected) exposure to covid-19: Secondary | ICD-10-CM | POA: Diagnosis present

## 2021-05-22 DIAGNOSIS — K3184 Gastroparesis: Secondary | ICD-10-CM | POA: Diagnosis present

## 2021-05-22 DIAGNOSIS — E1043 Type 1 diabetes mellitus with diabetic autonomic (poly)neuropathy: Secondary | ICD-10-CM | POA: Diagnosis present

## 2021-05-22 DIAGNOSIS — Z6828 Body mass index (BMI) 28.0-28.9, adult: Secondary | ICD-10-CM | POA: Diagnosis not present

## 2021-05-22 DIAGNOSIS — Z823 Family history of stroke: Secondary | ICD-10-CM | POA: Diagnosis not present

## 2021-05-22 DIAGNOSIS — K760 Fatty (change of) liver, not elsewhere classified: Secondary | ICD-10-CM | POA: Diagnosis present

## 2021-05-22 DIAGNOSIS — E1110 Type 2 diabetes mellitus with ketoacidosis without coma: Secondary | ICD-10-CM | POA: Diagnosis present

## 2021-05-22 DIAGNOSIS — I959 Hypotension, unspecified: Secondary | ICD-10-CM | POA: Diagnosis present

## 2021-05-22 DIAGNOSIS — N1832 Chronic kidney disease, stage 3b: Secondary | ICD-10-CM | POA: Diagnosis present

## 2021-05-22 LAB — GLUCOSE, CAPILLARY
Glucose-Capillary: 109 mg/dL — ABNORMAL HIGH (ref 70–99)
Glucose-Capillary: 150 mg/dL — ABNORMAL HIGH (ref 70–99)
Glucose-Capillary: 155 mg/dL — ABNORMAL HIGH (ref 70–99)
Glucose-Capillary: 165 mg/dL — ABNORMAL HIGH (ref 70–99)
Glucose-Capillary: 166 mg/dL — ABNORMAL HIGH (ref 70–99)
Glucose-Capillary: 167 mg/dL — ABNORMAL HIGH (ref 70–99)
Glucose-Capillary: 169 mg/dL — ABNORMAL HIGH (ref 70–99)
Glucose-Capillary: 172 mg/dL — ABNORMAL HIGH (ref 70–99)
Glucose-Capillary: 174 mg/dL — ABNORMAL HIGH (ref 70–99)
Glucose-Capillary: 176 mg/dL — ABNORMAL HIGH (ref 70–99)
Glucose-Capillary: 183 mg/dL — ABNORMAL HIGH (ref 70–99)
Glucose-Capillary: 188 mg/dL — ABNORMAL HIGH (ref 70–99)
Glucose-Capillary: 191 mg/dL — ABNORMAL HIGH (ref 70–99)
Glucose-Capillary: 196 mg/dL — ABNORMAL HIGH (ref 70–99)
Glucose-Capillary: 197 mg/dL — ABNORMAL HIGH (ref 70–99)
Glucose-Capillary: 208 mg/dL — ABNORMAL HIGH (ref 70–99)
Glucose-Capillary: 63 mg/dL — ABNORMAL LOW (ref 70–99)
Glucose-Capillary: 65 mg/dL — ABNORMAL LOW (ref 70–99)
Glucose-Capillary: 86 mg/dL (ref 70–99)

## 2021-05-22 LAB — COMPREHENSIVE METABOLIC PANEL
ALT: 90 U/L — ABNORMAL HIGH (ref 0–44)
AST: 165 U/L — ABNORMAL HIGH (ref 15–41)
Albumin: 2.8 g/dL — ABNORMAL LOW (ref 3.5–5.0)
Alkaline Phosphatase: 213 U/L — ABNORMAL HIGH (ref 38–126)
Anion gap: 9 (ref 5–15)
BUN: 17 mg/dL (ref 6–20)
CO2: 18 mmol/L — ABNORMAL LOW (ref 22–32)
Calcium: 9.1 mg/dL (ref 8.9–10.3)
Chloride: 110 mmol/L (ref 98–111)
Creatinine, Ser: 2.06 mg/dL — ABNORMAL HIGH (ref 0.44–1.00)
GFR, Estimated: 31 mL/min — ABNORMAL LOW (ref >60)
Glucose, Bld: 102 mg/dL — ABNORMAL HIGH (ref 70–99)
Potassium: 2.7 mmol/L — CL (ref 3.5–5.1)
Sodium: 137 mmol/L (ref 135–145)
Total Bilirubin: 0.9 mg/dL (ref 0.3–1.2)
Total Protein: 6.1 g/dL — ABNORMAL LOW (ref 6.5–8.1)

## 2021-05-22 LAB — BASIC METABOLIC PANEL
Anion gap: 11 (ref 5–15)
Anion gap: 9 (ref 5–15)
BUN: 18 mg/dL (ref 6–20)
BUN: 15 mg/dL (ref 6–20)
CO2: 17 mmol/L — ABNORMAL LOW (ref 22–32)
CO2: 14 mmol/L — ABNORMAL LOW (ref 22–32)
Calcium: 9.1 mg/dL (ref 8.9–10.3)
Calcium: 8.8 mg/dL — ABNORMAL LOW (ref 8.9–10.3)
Chloride: 107 mmol/L (ref 98–111)
Chloride: 114 mmol/L — ABNORMAL HIGH (ref 98–111)
Creatinine, Ser: 2.14 mg/dL — ABNORMAL HIGH (ref 0.44–1.00)
Creatinine, Ser: 1.93 mg/dL — ABNORMAL HIGH (ref 0.44–1.00)
GFR, Estimated: 29 mL/min — ABNORMAL LOW (ref >60)
GFR, Estimated: 33 mL/min — ABNORMAL LOW (ref >60)
Glucose, Bld: 166 mg/dL — ABNORMAL HIGH (ref 70–99)
Glucose, Bld: 230 mg/dL — ABNORMAL HIGH (ref 70–99)
Potassium: 2.8 mmol/L — ABNORMAL LOW (ref 3.5–5.1)
Potassium: 5.7 mmol/L — ABNORMAL HIGH (ref 3.5–5.1)
Sodium: 135 mmol/L (ref 135–145)
Sodium: 137 mmol/L (ref 135–145)

## 2021-05-22 LAB — CBC
HCT: 31.9 % — ABNORMAL LOW (ref 36.0–46.0)
Hemoglobin: 10.8 g/dL — ABNORMAL LOW (ref 12.0–15.0)
MCH: 35.4 pg — ABNORMAL HIGH (ref 26.0–34.0)
MCHC: 33.9 g/dL (ref 30.0–36.0)
MCV: 104.6 fL — ABNORMAL HIGH (ref 80.0–100.0)
Platelets: 160 10*3/uL (ref 150–400)
RBC: 3.05 MIL/uL — ABNORMAL LOW (ref 3.87–5.11)
RDW: 13.4 % (ref 11.5–15.5)
WBC: 6.8 10*3/uL (ref 4.0–10.5)
nRBC: 0 % (ref 0.0–0.2)

## 2021-05-22 LAB — ECHOCARDIOGRAM COMPLETE
AR max vel: 2.54 cm2
AV Area VTI: 2.52 cm2
AV Area mean vel: 2.38 cm2
AV Mean grad: 3 mmHg
AV Peak grad: 4.8 mmHg
Ao pk vel: 1.1 m/s
Area-P 1/2: 4.21 cm2
Height: 64 in
S' Lateral: 2.2 cm
Weight: 2617.3 oz

## 2021-05-22 LAB — MAGNESIUM
Magnesium: 1.6 mg/dL — ABNORMAL LOW (ref 1.7–2.4)
Magnesium: 2.2 mg/dL (ref 1.7–2.4)

## 2021-05-22 LAB — BETA-HYDROXYBUTYRIC ACID: Beta-Hydroxybutyric Acid: 0.48 mmol/L — ABNORMAL HIGH (ref 0.05–0.27)

## 2021-05-22 MED ORDER — POTASSIUM CHLORIDE 10 MEQ/100ML IV SOLN
10.0000 meq | INTRAVENOUS | Status: DC
  Administered 2021-05-22 (×6): 10 meq via INTRAVENOUS
  Filled 2021-05-22 (×6): qty 100

## 2021-05-22 MED ORDER — MAGNESIUM SULFATE 2 GM/50ML IV SOLN
2.0000 g | Freq: Once | INTRAVENOUS | Status: AC
  Administered 2021-05-22: 2 g via INTRAVENOUS
  Filled 2021-05-22: qty 50

## 2021-05-22 MED ORDER — BOOST / RESOURCE BREEZE PO LIQD CUSTOM
1.0000 | Freq: Three times a day (TID) | ORAL | Status: DC
  Administered 2021-05-22 – 2021-05-27 (×3): 1 via ORAL

## 2021-05-22 MED ORDER — GABAPENTIN 100 MG PO CAPS
100.0000 mg | ORAL_CAPSULE | Freq: Two times a day (BID) | ORAL | Status: DC
  Administered 2021-05-22 – 2021-05-23 (×3): 100 mg via ORAL
  Filled 2021-05-22 (×3): qty 1

## 2021-05-22 MED ORDER — ADULT MULTIVITAMIN W/MINERALS CH
1.0000 | ORAL_TABLET | Freq: Every day | ORAL | Status: DC
  Administered 2021-05-22 – 2021-05-28 (×7): 1 via ORAL
  Filled 2021-05-22 (×7): qty 1

## 2021-05-22 MED ORDER — INSULIN ASPART 100 UNIT/ML IJ SOLN
0.0000 [IU] | Freq: Every day | INTRAMUSCULAR | Status: DC
  Administered 2021-05-23: 5 [IU] via SUBCUTANEOUS
  Administered 2021-05-24: 2 [IU] via SUBCUTANEOUS
  Administered 2021-05-28: 5 [IU] via SUBCUTANEOUS

## 2021-05-22 MED ORDER — MORPHINE SULFATE (PF) 2 MG/ML IV SOLN
1.0000 mg | Freq: Once | INTRAVENOUS | Status: AC
  Administered 2021-05-22: 1 mg via INTRAVENOUS
  Filled 2021-05-22: qty 1

## 2021-05-22 MED ORDER — SODIUM CHLORIDE 0.9 % IV BOLUS
500.0000 mL | Freq: Once | INTRAVENOUS | Status: AC
  Administered 2021-05-22: 500 mL via INTRAVENOUS

## 2021-05-22 MED ORDER — INSULIN GLARGINE-YFGN 100 UNIT/ML ~~LOC~~ SOLN
10.0000 [IU] | Freq: Every day | SUBCUTANEOUS | Status: DC
  Administered 2021-05-22: 10 [IU] via SUBCUTANEOUS
  Filled 2021-05-22 (×2): qty 0.1

## 2021-05-22 MED ORDER — INSULIN ASPART 100 UNIT/ML IJ SOLN
0.0000 [IU] | Freq: Three times a day (TID) | INTRAMUSCULAR | Status: DC
  Administered 2021-05-22: 2 [IU] via SUBCUTANEOUS
  Administered 2021-05-23: 9 [IU] via SUBCUTANEOUS
  Administered 2021-05-23: 3 [IU] via SUBCUTANEOUS
  Administered 2021-05-23: 5 [IU] via SUBCUTANEOUS
  Administered 2021-05-24: 7 [IU] via SUBCUTANEOUS
  Administered 2021-05-24 – 2021-05-25 (×2): 2 [IU] via SUBCUTANEOUS
  Administered 2021-05-25: 5 [IU] via SUBCUTANEOUS
  Administered 2021-05-25: 7 [IU] via SUBCUTANEOUS
  Administered 2021-05-26: 9 [IU] via SUBCUTANEOUS
  Administered 2021-05-26 – 2021-05-27 (×2): 3 [IU] via SUBCUTANEOUS
  Administered 2021-05-27 (×2): 7 [IU] via SUBCUTANEOUS
  Administered 2021-05-28: 5 [IU] via SUBCUTANEOUS
  Administered 2021-05-28: 7 [IU] via SUBCUTANEOUS

## 2021-05-22 NOTE — Assessment & Plan Note (Addendum)
Secondary to DKA.  IV fluids.  Still not at baseline.

## 2021-05-22 NOTE — Assessment & Plan Note (Signed)
A1c slightly worse than previous at 9.6.  Needs education.  She reports previously being on Lantus twice a day and then making adjustments based off of episodes of nighttime hypoglycemia.  Diabetes coordinator will help with education.

## 2021-05-22 NOTE — Assessment & Plan Note (Signed)
Secondary to CHF?  We will follow.

## 2021-05-22 NOTE — Assessment & Plan Note (Signed)
Currently mild hypotension due to volume depletion from DKA

## 2021-05-22 NOTE — Assessment & Plan Note (Signed)
Meets criteria for BMI greater than 25 

## 2021-05-22 NOTE — Hospital Course (Addendum)
41 year old female with past medical history for type 1 diabetes poorly controlled with secondary gastroparesis and peripheral neuropathy as well as hypertension who presented to the emergency room with palpitations, chest tightness and shortness of breath.  Patient found to be in DKA and admitted to the hospitalist service, being started on an insulin drip plus IV fluids.  By 1/31, anion gap corrected and DKA resolved.

## 2021-05-22 NOTE — Assessment & Plan Note (Signed)
Started on insulin drip and IV fluids.  Early morning of 1/31, anion gap corrected and DKA resolved.  We will switch over to insulin and sliding scale.

## 2021-05-22 NOTE — Assessment & Plan Note (Signed)
We will start Neurontin

## 2021-05-22 NOTE — Assessment & Plan Note (Addendum)
No previously reported history of CHF.  Echocardiogram done 1/31 unremarkable with normal diastolic and systolic function.

## 2021-05-22 NOTE — Progress Notes (Signed)
[facility] Progress Note  Patient: Anna Garcia    HLK:[id number]  DOA: 05/21/2021    Date of Service: the patient was seen and examined on 05/22/2021  Brief hospital course: 41 year old female with past medical history for type 1 diabetes poorly controlled with secondary gastroparesis and peripheral neuropathy as well as hypertension who presented to the emergency room with palpitations, chest tightness and shortness of breath.  Patient found to be in DKA and admitted to the hospitalist service, being started on an insulin drip plus IV fluids.  By 1/31, anion gap corrected and DKA resolved.    Assessment and Plan: Assessment and Plan: * DKA (diabetic ketoacidosis) ([employer])- (present on admission) Started on insulin drip and IV fluids.  Early morning of 1/31, anion gap corrected and DKA resolved.  We will switch over to insulin and sliding scale.  Uncontrolled type 1 diabetes mellitus with hyperglycemia, with long-term current use of insulin ([place])- (present on admission) A1c slightly worse than previous at 9.6.  Needs education.  She reports previously being on Lantus twice a day and then making adjustments based off of episodes of nighttime hypoglycemia.  Diabetes coordinator will help with education.  AKI (acute kidney injury) ([place])- (present on admission) Secondary to DKA.  IV fluids.  Still not at baseline.  Gastroparesis- (present on admission) We will start Neurontin  Hypertension- (present on admission) Currently mild hypotension due to volume depletion from DKA  Hypokalemia- (present on admission) Replace.  Prolonged hypokalemia due to insulin shifts  Elevated brain natriuretic peptide (BNP) level- (present on admission) No previously reported history of CHF.  Will check echocardiogram this may account for her transaminitis and also may be underlying cause of persistently elevated CBGs  Overweight (BMI 25.0-29.9)- (present on admission) Meets criteria for BMI greater than  25  Transaminitis- (present on admission) Secondary to CHF?  We will follow.    Body mass index is 28.08 kg/m.        Consultants: Diabetes coordinator  Procedures: 2D echo pending  Antimicrobials: None  Code Status: Full code   Subjective: Patient feels tired, complains of generalized chronic pain apart from her gastroparesis but also some more focal pain in her feet due to her neuropathy  Objective: Noted some episodes of hypotension Vitals:   05/22/21 1200 05/22/21 1230  BP: (!) 84/52 107/62  Pulse: 92 98  Resp: 16 18  Temp:    SpO2: 100% 100%    Intake/Output Summary (Last 24 hours) at 05/22/2021 1247 Last data filed at 05/22/2021 1247 Gross per 24 hour  Intake 7518.39 ml  Output 1550 ml  Net 5968.39 ml   Filed Weights   05/21/21 1350 05/21/21 1617  Weight: 77.1 kg 74.2 kg   Body mass index is 28.08 kg/m.  Exam:  General: Alert and oriented x3, mild distress secondary to pain HEENT: Normocephalic and atraumatic, mucous membranes slightly dry Cardiovascular: Regular rate and rhythm, S1-S2 Respiratory: Clear to auscultation bilaterally Abdomen: Soft, mild nonspecific tenderness, minimal distention, hypoactive bowel sounds Musculoskeletal: No clubbing or cyanosis, trace pitting edema Skin: No skin breaks, tears or lesions Psychiatry: Appropriate, no evidence of psychoses Neurology: Subjective numbness in the lower extremities  Data Reviewed: Labs reviewed.  Hypokalemia now with some mild hyperkalemia due to aggressive potassium repletion.  Renal function continues to improve.  A1c at 9.6  Disposition:  Status is: Inpatient Remains inpatient appropriate because: Stabilization of blood sugars now that she is transitioning off of insulin drip  Planned Discharge Destination: Home  Family Communication: Declined for me to call family DVT Prophylaxis: SCDs Start: 05/21/21 1340    Author: Annita Brod ,MD 05/22/2021 12:47 PM  To reach  On-call, see care teams to locate the attending and reach out via [email or web address]. Between 7PM-7AM, please contact night-coverage If you still have difficulty reaching the attending provider, please page the [facility] (Director on Call) for [facility] on amion for assistance.

## 2021-05-22 NOTE — Assessment & Plan Note (Signed)
Replace.  Prolonged hypokalemia due to insulin shifts

## 2021-05-22 NOTE — Progress Notes (Signed)
Echocardiogram 2D Echocardiogram has been performed.  Arlyss Gandy 05/22/2021, 2:36 PM

## 2021-05-22 NOTE — Progress Notes (Signed)
Inpatient Diabetes Program Recommendations  AACE/ADA: New Consensus Statement on Inpatient Glycemic Control (2015)  Target Ranges:  Prepandial:   less than 140 mg/dL      Peak postprandial:   less than 180 mg/dL (1-2 hours)      Critically ill patients:  140 - 180 mg/dL   Lab Results  Component Value Date   GLUCAP 176 (H) 05/22/2021   HGBA1C 9.6 (H) 05/21/2021    Review of Glycemic Control  Diabetes history: DM1 Outpatient Diabetes medications: Lantus 14 units QD, Humalog 1-15 TID Current orders for Inpatient glycemic control: Semglee 10 units QHS, Novolog 0-9 units TID with meals and 0-5 HS  HgbA1C - 9.6%  Inpatient Diabetes Program Recommendations:    Add Novolog 3 units TID with meals if eating > 50%.  Spoke with pt at bedside regarding her diabetes control prior to admission. Pt states she has been feeling poorly for past month and blood sugars have been very labile. Has lost weight and continues to have esophagus and gastroparesis issues. Sees Endo on regular basis. Monitors blood sugars daily and said she eats breakfast and does well, then eats in afternoon and she starts to get nauseated and usually vomits after this meal. Has a hard time controlling blood sugars with eating, then getting nauseated, and it's a vicious cycle.  Continue to monitor while inpatient.   Thank you. Lorenda Peck, RD, LDN, CDE Inpatient Diabetes Coordinator [phone]

## 2021-05-22 NOTE — Progress Notes (Signed)
Initial Nutrition Assessment  DOCUMENTATION CODES:   Not applicable  INTERVENTION:  - will order Boost Breeze TID, each supplement provides 250 kcal and 9 grams of protein. - will order 1 tablet multivitamin with minerals/day.   NUTRITION DIAGNOSIS:   Inadequate oral intake related to acute illness, chronic illness as evidenced by per patient/family report.  GOAL:   Patient will meet greater than or equal to 90% of their needs  MONITOR:   PO intake, Supplement acceptance, Labs, Weight trends  REASON FOR ASSESSMENT:   Malnutrition Screening Tool  ASSESSMENT:   41 y.o. female with medical history of type 1 DM, gastroparesis, HTN, diabetic peripheral neuropathy, overweight, tobacco use. She presented to the ED due to palpitations, shortness of breath, and chest tightness. She was experiencing occasional lightheadedness and N/V. she was admitted with DKA. Patient reported using her insulin as prescribed.  Patient laying in bed with no visitors present at the time of RD visit. Patient's diet advanced from NPO to Carb Modified shortly before RD visit. Patient requests a chef salad with ranch, a fresh fruit cup, and diet ginger ale for lunch.  She reports that she has had issues with gastroparesis and esophageal dysmotility for several years; has worsened over time.   She often feels that both solids and liquids get stuck in her throat until she vomits or that if they do progress into her stomach, things will sit in there for a very prolonged time.  She typically only eats one meal/day which is softer foods such as fruit, salad, a breakfast sandwich with less bites of bread, or an omelet. Beans, rice, and bread are difficult and are the items she is able to eat the least amount of at any one time.   The last time she ate was Saturday (1/28) morning when she had part of an omelet. She typically drinks water, liquid IV, sips of a protein shake.   She reports that she has been  experiencing weakness and episodes of lightheadedness. She shares that ~1 year ago she had frequent falls and that she often had difficulty getting up. She has chronic mild BLE edema; degree of edema present currently is normal.   She reports that her weight has been stable for several months.   Weight yesterday was 163 lb and weight on 10/12/20 was 172 lb; this indicates 9 lb weight loss (5% body weight) in the past 7 months.     Labs reviewed; CBGs: 86-208 mg/dl, K: 5.7 mmol/l, Cl: 114 mmol/l, creatinine: 1.93 mg/dl, GFR: 33 ml/min.  Medications reviewed; sliding scale novolog, 10 units semglee/day, 2 g IV Mg sulfate x1 run 1/30 and x1 run 1/31, 40 mg IV protonix/day, 10 mEq IV KCl x4 runs 1/30, 40 mEq Klor-Con x1 dose 1/30.     NUTRITION - FOCUSED PHYSICAL EXAM:  Flowsheet Row Most Recent Value  Orbital Region No depletion  Upper Arm Region No depletion  Thoracic and Lumbar Region Unable to assess  Buccal Region No depletion  Temple Region No depletion  Clavicle Bone Region No depletion  Clavicle and Acromion Bone Region No depletion  Scapular Bone Region No depletion  Dorsal Hand No depletion  Patellar Region No depletion  Anterior Thigh Region No depletion  Posterior Calf Region No depletion  Edema (RD Assessment) Mild  [BLE]  Hair Reviewed  Eyes Reviewed  Mouth Reviewed  Skin Reviewed  Nails Reviewed       Diet Order:   Diet Order  Diet Carb Modified Fluid consistency: Thin; Room service appropriate? Yes  Diet effective now                   EDUCATION NEEDS:   Education needs have been addressed  Skin:  Skin Assessment: Reviewed RN Assessment  Last BM:  PTA/unknown  Height:   Ht Readings from Last 1 Encounters:  05/21/21 5\' 4"  (1.626 m)    Weight:   Wt Readings from Last 1 Encounters:  05/21/21 74.2 kg     Estimated Nutritional Needs:  Kcal:  1900-2100 kcal Protein:  95-105 grams Fluid:  >/= 2 L/day     Jarome Matin,  MS, RD, LDN Inpatient Clinical Dietitian RD pager # available in [place]  After hours/weekend pager # available in [facility]

## 2021-05-23 DIAGNOSIS — R7401 Elevation of levels of liver transaminase levels: Secondary | ICD-10-CM

## 2021-05-23 LAB — CBC
HCT: 29.3 % — ABNORMAL LOW (ref 36.0–46.0)
Hemoglobin: 9.8 g/dL — ABNORMAL LOW (ref 12.0–15.0)
MCH: 35.8 pg — ABNORMAL HIGH (ref 26.0–34.0)
MCHC: 33.4 g/dL (ref 30.0–36.0)
MCV: 106.9 fL — ABNORMAL HIGH (ref 80.0–100.0)
Platelets: 131 10*3/uL — ABNORMAL LOW (ref 150–400)
RBC: 2.74 MIL/uL — ABNORMAL LOW (ref 3.87–5.11)
RDW: 14 % (ref 11.5–15.5)
WBC: 5.4 10*3/uL (ref 4.0–10.5)
nRBC: 0 % (ref 0.0–0.2)

## 2021-05-23 LAB — HEPATIC FUNCTION PANEL
ALT: 103 U/L — ABNORMAL HIGH (ref 0–44)
AST: 226 U/L — ABNORMAL HIGH (ref 15–41)
Albumin: 2.6 g/dL — ABNORMAL LOW (ref 3.5–5.0)
Alkaline Phosphatase: 200 U/L — ABNORMAL HIGH (ref 38–126)
Bilirubin, Direct: 0.2 mg/dL (ref 0.0–0.2)
Indirect Bilirubin: 0.4 mg/dL (ref 0.3–0.9)
Total Bilirubin: 0.6 mg/dL (ref 0.3–1.2)
Total Protein: 5.7 g/dL — ABNORMAL LOW (ref 6.5–8.1)

## 2021-05-23 LAB — GLUCOSE, CAPILLARY
Glucose-Capillary: 249 mg/dL — ABNORMAL HIGH (ref 70–99)
Glucose-Capillary: 291 mg/dL — ABNORMAL HIGH (ref 70–99)
Glucose-Capillary: 409 mg/dL — ABNORMAL HIGH (ref 70–99)
Glucose-Capillary: 386 mg/dL — ABNORMAL HIGH (ref 70–99)
Glucose-Capillary: 303 mg/dL — ABNORMAL HIGH (ref 70–99)

## 2021-05-23 LAB — BASIC METABOLIC PANEL
Anion gap: 8 (ref 5–15)
BUN: 10 mg/dL (ref 6–20)
CO2: 16 mmol/L — ABNORMAL LOW (ref 22–32)
Calcium: 8.3 mg/dL — ABNORMAL LOW (ref 8.9–10.3)
Chloride: 114 mmol/L — ABNORMAL HIGH (ref 98–111)
Creatinine, Ser: 1.71 mg/dL — ABNORMAL HIGH (ref 0.44–1.00)
GFR, Estimated: 39 mL/min — ABNORMAL LOW (ref >60)
Glucose, Bld: 231 mg/dL — ABNORMAL HIGH (ref 70–99)
Potassium: 3.2 mmol/L — ABNORMAL LOW (ref 3.5–5.1)
Sodium: 138 mmol/L (ref 135–145)

## 2021-05-23 LAB — MAGNESIUM: Magnesium: 2 mg/dL (ref 1.7–2.4)

## 2021-05-23 MED ORDER — MOMETASONE FURO-FORMOTEROL FUM 100-5 MCG/ACT IN AERO
2.0000 | INHALATION_SPRAY | Freq: Two times a day (BID) | RESPIRATORY_TRACT | Status: DC
  Administered 2021-05-23 – 2021-05-28 (×11): 2 via RESPIRATORY_TRACT
  Filled 2021-05-23: qty 8.8

## 2021-05-23 MED ORDER — MONTELUKAST SODIUM 10 MG PO TABS
10.0000 mg | ORAL_TABLET | Freq: Every day | ORAL | Status: DC
  Administered 2021-05-23 – 2021-05-27 (×5): 10 mg via ORAL
  Filled 2021-05-23 (×5): qty 1

## 2021-05-23 MED ORDER — LEVALBUTEROL TARTRATE 45 MCG/ACT IN AERO
1.0000 | INHALATION_SPRAY | Freq: Four times a day (QID) | RESPIRATORY_TRACT | Status: DC | PRN
  Filled 2021-05-23: qty 15

## 2021-05-23 MED ORDER — VITAMIN D 25 MCG (1000 UNIT) PO TABS
1000.0000 [IU] | ORAL_TABLET | Freq: Every day | ORAL | Status: DC
  Administered 2021-05-23 – 2021-05-28 (×6): 1000 [IU] via ORAL
  Filled 2021-05-23 (×6): qty 1

## 2021-05-23 MED ORDER — INSULIN ASPART 100 UNIT/ML IJ SOLN
3.0000 [IU] | Freq: Three times a day (TID) | INTRAMUSCULAR | Status: DC
  Administered 2021-05-23: 3 [IU] via SUBCUTANEOUS

## 2021-05-23 MED ORDER — INSULIN GLARGINE-YFGN 100 UNIT/ML ~~LOC~~ SOLN
12.0000 [IU] | Freq: Every day | SUBCUTANEOUS | Status: DC
  Filled 2021-05-23: qty 0.12

## 2021-05-23 MED ORDER — FAMOTIDINE 20 MG PO TABS
40.0000 mg | ORAL_TABLET | Freq: Two times a day (BID) | ORAL | Status: DC
  Administered 2021-05-23 – 2021-05-28 (×11): 40 mg via ORAL
  Filled 2021-05-23 (×12): qty 2

## 2021-05-23 MED ORDER — SODIUM CHLORIDE 0.9 % IV SOLN
INTRAVENOUS | Status: DC
Start: 2021-05-23 — End: 2021-05-23

## 2021-05-23 MED ORDER — POTASSIUM CHLORIDE CRYS ER 20 MEQ PO TBCR
40.0000 meq | EXTENDED_RELEASE_TABLET | Freq: Once | ORAL | Status: AC
  Administered 2021-05-23: 40 meq via ORAL
  Filled 2021-05-23: qty 2

## 2021-05-23 MED ORDER — SODIUM BICARBONATE 650 MG PO TABS
650.0000 mg | ORAL_TABLET | Freq: Two times a day (BID) | ORAL | Status: DC
  Administered 2021-05-23 – 2021-05-24 (×4): 650 mg via ORAL
  Filled 2021-05-23 (×4): qty 1

## 2021-05-23 MED ORDER — GABAPENTIN 300 MG PO CAPS
300.0000 mg | ORAL_CAPSULE | Freq: Three times a day (TID) | ORAL | Status: DC
  Administered 2021-05-23 – 2021-05-28 (×16): 300 mg via ORAL
  Filled 2021-05-23 (×16): qty 1

## 2021-05-23 MED ORDER — SODIUM CHLORIDE 0.9 % IV BOLUS
500.0000 mL | Freq: Once | INTRAVENOUS | Status: AC
  Administered 2021-05-23: 500 mL via INTRAVENOUS

## 2021-05-23 MED ORDER — INSULIN GLARGINE-YFGN 100 UNIT/ML ~~LOC~~ SOLN
14.0000 [IU] | Freq: Every day | SUBCUTANEOUS | Status: DC
  Administered 2021-05-23: 14 [IU] via SUBCUTANEOUS
  Filled 2021-05-23: qty 0.14

## 2021-05-23 NOTE — Progress Notes (Signed)
PROGRESS NOTE    Anna Garcia  PRF:[id number] DOB: [birth year] DOA: 05/21/2021 PCP: Day, [name], MD    Chief Complaint  Patient presents with   Palpitations   Shortness of Breath    Brief Narrative:  41 year old female with past medical history for type 1 diabetes poorly controlled with secondary gastroparesis and peripheral neuropathy as well as hypertension who presented to the emergency room with palpitations, chest tightness and shortness of breath.  Patient found to be in DKA and admitted to the hospitalist service, being started on an insulin drip plus IV fluids.   By 1/31, anion gap corrected and DKA resolved.       Assessment & Plan:   Principal Problem:   DKA (diabetic ketoacidosis) ([employer]) Active Problems:   Uncontrolled type 1 diabetes mellitus with hyperglycemia, with long-term current use of insulin (HCC)   AKI (acute kidney injury) ([place])   Transaminitis   Hypertension   Gastroparesis   DKA, type 1 (HCC)   CKD stage G3a/A3, GFR 45-59 and albumin creatinine ratio >300 mg/g (HCC)   Hypokalemia   Prolonged QT interval   Elevated brain natriuretic peptide (BNP) level   Overweight (BMI 25.0-29.9)  #1 DKA, POA/uncontrolled type 1 diabetes mellitus with neuropathy -Patient on presentation noted to be in DKA and placed on an insulin drip, IV fluids. -Anion gap closed and patient transitioned to subcutaneous insulin with sliding scale insulin. -Hemoglobin A1c 9.6 (05/21/2021) -CBG 249 this morning. -Increase Semglee to 14 units daily. -Place on NovoLog meal coverage insulin 3 units 3 times daily with meals. -SSI. -Increase Neurontin to 300 mg 3 times daily to aid with neuropathy. -Diabetes coordinator following.  2.  CKD stage IIIb -Stable.  3.  Metabolic acidosis -Patient with CKD stage IIIb, was in DKA and noted to be acidotic with some improvement with acidosis, anion gap closed, patient transition to subcutaneous insulin. -Placed on bicarb tablets and  follow-up. -Normal saline IV fluid bolus.  4.  Gastroparesis -Patient states history of gastroparesis, has tried Reglan in the past with no significant improvement. -Patient started on Neurontin per Dr. [place] Pink. -Follow-up.  5.  Hypertension -Blood pressure noted to be soft felt likely secondary to volume depletion. -We will give a fluid bolus. -Follow.  6.  Hypokalemia -K-Dur 40 mEq p.o. x1. -Repeat labs in AM.  7.  Elevated BNP, POA -Patient with no signs or symptoms of volume overload on examination. -2D echo with normal EF, no wall motion abnormalities.  8.  Transaminitis -??  Etiology. -Check a right upper quadrant ultrasound. -Check an acute hepatitis panel. -Follow-up.  9.  Overweight -Lifestyle modification.   DVT prophylaxis: SCDs Code Status: Full Family Communication: Updated patient.  No family at bedside. Disposition:   Status is: Inpatient Remains inpatient appropriate because: Severity of illness.  Planned Discharge Destination: Home         Consultants:  None  Procedures:  Chest x-ray 05/21/2021 2D echo 05/22/2021   Antimicrobials:  None   Subjective: Patient sitting up in bed.  No emesis.  No chest pain.  No shortness of breath.  Overall feeling a little bit better than on admission.  Some nausea but tolerating oral intake.  Complaining of significant neuropathy in bilateral feet.  Objective: Vitals:   05/23/21 0123 05/23/21 0522 05/23/21 0929 05/23/21 1302  BP: 101/69 98/65 102/73 107/72  Pulse: 97 99 (!) 108 98  Resp: 18 18 18 18   Temp: 98.3 F (36.8 C) 98.4 F (36.9 C) 98.3 F (36.8  C) 98.9 F (37.2 C)  TempSrc: Oral Oral Oral Oral  SpO2: 99% 98% 99% 99%  Weight:      Height:        Intake/Output Summary (Last 24 hours) at 05/23/2021 1335 Last data filed at 05/23/2021 1300 Gross per 24 hour  Intake 1464.12 ml  Output 600 ml  Net 864.12 ml   Filed Weights   05/21/21 1350 05/21/21 1617  Weight: 77.1 kg 74.2 kg     Examination:  General exam: Appears calm and comfortable  Respiratory system: Clear to auscultation. Respiratory effort normal. Cardiovascular system: S1 & S2 heard, RRR. No JVD, murmurs, rubs, gallops or clicks. No pedal edema. Gastrointestinal system: Abdomen is nondistended, soft and nontender. No organomegaly or masses felt. Normal bowel sounds heard. Central nervous system: Alert and oriented. No focal neurological deficits. Extremities: Symmetric 5 x 5 power. Skin: No rashes, lesions or ulcers Psychiatry: Judgement and insight appear normal. Mood & affect appropriate.     Data Reviewed: I have personally reviewed following labs and imaging studies  CBC: Recent Labs  Lab 05/21/21 1110 05/22/21 0116 05/23/21 0421  WBC 10.2 6.8 5.4  NEUTROABS 6.0  --   --   HGB 12.8 10.8* 9.8*  HCT 39.3 31.9* 29.3*  MCV 108.0* 104.6* 106.9*  PLT 251 160 131*    Basic Metabolic Panel: Recent Labs  Lab 05/21/21 1110 05/21/21 1345 05/21/21 1901 05/21/21 2115 05/22/21 0116 05/22/21 0517 05/22/21 0934 05/23/21 0421  NA 134* 132*   < > 135 135 137 137 138  K 3.0* 2.7*   < > 2.8* 2.8* 2.7* 5.7* 3.2*  CL 100 101   < > 106 107 110 114* 114*  CO2 10* 12*   < > 18* 17* 18* 14* 16*  GLUCOSE 368* 293*   < > 165* 166* 102* 230* 231*  BUN 24* 22*   < > 19 18 17 15 10   CREATININE 3.07* 2.83*   < > 2.17* 2.14* 2.06* 1.93* 1.71*  CALCIUM 10.0 9.3   < > 9.1 9.1 9.1 8.8* 8.3*  MG 2.0 1.8  --   --  1.6*  --  2.2 2.0   < > = values in this interval not displayed.    GFR: Estimated Creatinine Clearance: 43.6 mL/min (A) (by C-G formula based on SCr of 1.71 mg/dL (H)).  Liver Function Tests: Recent Labs  Lab 05/21/21 1110 05/22/21 0517 05/23/21 0421  AST 192* 165* 226*  ALT 122* 90* 103*  ALKPHOS 299* 213* 200*  BILITOT 1.7* 0.9 0.6  PROT 8.0 6.1* 5.7*  ALBUMIN 3.8 2.8* 2.6*    CBG: Recent Labs  Lab 05/22/21 1525 05/22/21 1704 05/22/21 2127 05/23/21 0734 05/23/21 1107   GLUCAP 165* 191* 176* 249* 291*     Recent Results (from the past 240 hour(s))  Resp Panel by RT-PCR (Flu A&B, Covid) Nasopharyngeal Swab     Status: None   Collection Time: 05/21/21 11:41 AM   Specimen: Nasopharyngeal Swab; Nasopharyngeal(NP) swabs in vial transport medium  Result Value Ref Range Status   SARS Coronavirus 2 by RT PCR NEGATIVE NEGATIVE Final    Comment: (NOTE) SARS-CoV-2 target nucleic acids are NOT DETECTED.  The SARS-CoV-2 RNA is generally detectable in upper respiratory specimens during the acute phase of infection. The lowest concentration of SARS-CoV-2 viral copies this assay can detect is 138 copies/mL. A negative result does not preclude SARS-Cov-2 infection and should not be used as the sole basis for treatment or other patient  management decisions. A negative result may occur with  improper specimen collection/handling, submission of specimen other than nasopharyngeal swab, presence of viral mutation(s) within the areas targeted by this assay, and inadequate number of viral copies(<138 copies/mL). A negative result must be combined with clinical observations, patient history, and epidemiological information. The expected result is Negative.  Fact Sheet for Patients:  EntrepreneurPulse.com.au  Fact Sheet for Healthcare Providers:  IncredibleEmployment.be  This test is no t yet approved or cleared by the [place] FDA and  has been authorized for detection and/or diagnosis of SARS-CoV-2 by FDA under an Emergency Use Authorization (EUA). This EUA will remain  in effect (meaning this test can be used) for the duration of the COVID-19 declaration under Section 564(b)(1) of the Act, 21 U.S.C.section 360bbb-3(b)(1), unless the authorization is terminated  or revoked sooner.       Influenza A by PCR NEGATIVE NEGATIVE Final   Influenza B by PCR NEGATIVE NEGATIVE Final    Comment: (NOTE) The Xpert Xpress  SARS-CoV-2/FLU/RSV plus assay is intended as an aid in the diagnosis of influenza from Nasopharyngeal swab specimens and should not be used as a sole basis for treatment. Nasal washings and aspirates are unacceptable for Xpert Xpress SARS-CoV-2/FLU/RSV testing.  Fact Sheet for Patients: EntrepreneurPulse.com.au  Fact Sheet for Healthcare Providers: IncredibleEmployment.be  This test is not yet approved or cleared by the [place] FDA and has been authorized for detection and/or diagnosis of SARS-CoV-2 by FDA under an Emergency Use Authorization (EUA). This EUA will remain in effect (meaning this test can be used) for the duration of the COVID-19 declaration under Section 564(b)(1) of the Act, 21 U.S.C. section 360bbb-3(b)(1), unless the authorization is terminated or revoked.  Performed at [facility], [place] [address] [place], [place] [address]   MRSA Next Gen by PCR, Nasal     Status: None   Collection Time: 05/21/21  5:08 PM   Specimen: Nasal Mucosa; Nasal Swab  Result Value Ref Range Status   MRSA by PCR Next Gen NOT DETECTED NOT DETECTED Final    Comment: (NOTE) The GeneXpert MRSA Assay (FDA approved for NASAL specimens only), is one component of a comprehensive MRSA colonization surveillance program. It is not intended to diagnose MRSA infection nor to guide or monitor treatment for MRSA infections. Test performance is not FDA approved in patients less than 69 years old. Performed at [facility], [place] [address] [place], [place] [address]          Radiology Studies: ECHOCARDIOGRAM COMPLETE  Result Date: 05/22/2021    ECHOCARDIOGRAM REPORT   Patient Name:   MYLEKA MONCURE Date of Exam: 05/22/2021 Medical Rec #:  [id number]    Height:       64.0 in Accession #:    [id number]   Weight:       163.6 lb Date of Birth:  11-Dec-1980     BSA:          1.796 m Patient Age:    69 years     BP:            95/71 mmHg Patient Gender: F            HR:           96 bpm. Exam Location:  Inpatient Procedure: 2D Echo Indications:    Acute diastolic CHF  History:        Patient has no prior history of Echocardiogram examinations.  Risk Factors:Diabetes and Hypertension.  Sonographer:    Arlyss Gandy Referring Phys: [place]  1. Left ventricular ejection fraction, by estimation, is 65 to 70%. The left ventricle has normal function. The left ventricle has no regional wall motion abnormalities. Left ventricular diastolic parameters were normal.  2. Right ventricular systolic function is normal. The right ventricular size is normal.  3. The mitral valve is normal in structure. No evidence of mitral valve regurgitation. No evidence of mitral stenosis.  4. The aortic valve is normal in structure. Aortic valve regurgitation is not visualized. No aortic stenosis is present.  5. The inferior vena cava is normal in size with greater than 50% respiratory variability, suggesting right atrial pressure of 3 mmHg. FINDINGS  Left Ventricle: Left ventricular ejection fraction, by estimation, is 65 to 70%. The left ventricle has normal function. The left ventricle has no regional wall motion abnormalities. The left ventricular internal cavity size was normal in size. There is  no left ventricular hypertrophy. Left ventricular diastolic parameters were normal. Right Ventricle: The right ventricular size is normal. No increase in right ventricular wall thickness. Right ventricular systolic function is normal. Left Atrium: Left atrial size was normal in size. Right Atrium: Right atrial size was normal in size. Pericardium: There is no evidence of pericardial effusion. Mitral Valve: The mitral valve is normal in structure. No evidence of mitral valve regurgitation. No evidence of mitral valve stenosis. Tricuspid Valve: The tricuspid valve is normal in structure. Tricuspid valve regurgitation is not  demonstrated. No evidence of tricuspid stenosis. Aortic Valve: The aortic valve is normal in structure. Aortic valve regurgitation is not visualized. No aortic stenosis is present. Aortic valve mean gradient measures 3.0 mmHg. Aortic valve peak gradient measures 4.8 mmHg. Aortic valve area, by VTI measures 2.52 cm. Pulmonic Valve: The pulmonic valve was normal in structure. Pulmonic valve regurgitation is not visualized. No evidence of pulmonic stenosis. Aorta: The aortic root is normal in size and structure. Venous: The inferior vena cava is normal in size with greater than 50% respiratory variability, suggesting right atrial pressure of 3 mmHg. IAS/Shunts: No atrial level shunt detected by color flow Doppler.  LEFT VENTRICLE PLAX 2D LVIDd:         3.20 cm   Diastology LVIDs:         2.20 cm   LV e' medial:    11.10 cm/s LV PW:         0.80 cm   LV E/e' medial:  6.9 LV IVS:        0.80 cm   LV e' lateral:   15.80 cm/s LVOT diam:     2.00 cm   LV E/e' lateral: 4.9 LV SV:         51 LV SV Index:   28 LVOT Area:     3.14 cm  RIGHT VENTRICLE RV Basal diam:  2.60 cm RV Mid diam:    2.40 cm RV S prime:     11.10 cm/s TAPSE (M-mode): 1.6 cm LEFT ATRIUM             Index        RIGHT ATRIUM           Index LA diam:        2.60 cm 1.45 cm/m   RA Area:     12.10 cm LA Vol (A2C):   20.1 ml 11.19 ml/m  RA Volume:   26.40 ml  14.70 ml/m LA Vol (  A4C):   27.3 ml 15.20 ml/m LA Biplane Vol: 24.5 ml 13.64 ml/m  AORTIC VALVE AV Area (Vmax):    2.54 cm AV Area (Vmean):   2.38 cm AV Area (VTI):     2.52 cm AV Vmax:           110.00 cm/s AV Vmean:          75.200 cm/s AV VTI:            0.202 m AV Peak Grad:      4.8 mmHg AV Mean Grad:      3.0 mmHg LVOT Vmax:         88.80 cm/s LVOT Vmean:        57.000 cm/s LVOT VTI:          0.162 m LVOT/AV VTI ratio: 0.80  AORTA Ao Root diam: 2.70 cm Ao Asc diam:  2.50 cm MITRAL VALVE MV Area (PHT): 4.21 cm    SHUNTS MV Decel Time: 180 msec    Systemic VTI:  0.16 m MV E velocity: 76.70  cm/s  Systemic Diam: 2.00 cm MV A velocity: 59.10 cm/s MV E/A ratio:  1.30 Candee Furbish MD Electronically signed by Candee Furbish MD Signature Date/Time: 05/22/2021/3:05:39 PM    Final         Scheduled Meds:  Chlorhexidine Gluconate Cloth  6 each Topical Q0600   cholecalciferol  1,000 Units Oral Daily   famotidine  40 mg Oral BID   feeding supplement  1 Container Oral TID BM   gabapentin  100 mg Oral BID   insulin aspart  0-5 Units Subcutaneous QHS   insulin aspart  0-9 Units Subcutaneous TID WC   insulin glargine-yfgn  10 Units Subcutaneous QHS   mometasone-formoterol  2 puff Inhalation BID   montelukast  10 mg Oral QHS   multivitamin with minerals  1 tablet Oral Daily   pantoprazole (PROTONIX) IV  40 mg Intravenous Q24H   sodium bicarbonate  650 mg Oral BID   Continuous Infusions:  sodium chloride 10 mL/hr at 05/22/21 1600     LOS: 1 day    Time spent: 40 minutes    Irine Seal, MD [facility]   To contact the attending provider between 7A-7P or the covering provider during after hours 7P-7A, please log into the web site [email or web address] and access using universal [place] password for that web site. If you do not have the password, please call the hospital operator.  05/23/2021, 1:35 PM

## 2021-05-23 NOTE — Progress Notes (Signed)
Transition of Care [facility]) Screening Note  Patient Details  Name: Anna Garcia Date of Birth: Sep 14, 1980  Transition of Care [facility]) CM/SW Contact:    Sherie Don, LCSW Phone Number: 05/23/2021, 10:45 AM  Transition of Care Department [facility]) has reviewed patient and no TOC needs have been identified at this time. We will continue to monitor patient advancement through interdisciplinary progression rounds. If new patient transition needs arise, please place a TOC consult.

## 2021-05-24 ENCOUNTER — Inpatient Hospital Stay (HOSPITAL_COMMUNITY): Payer: Medicare Other

## 2021-05-24 DIAGNOSIS — N764 Abscess of vulva: Secondary | ICD-10-CM | POA: Diagnosis present

## 2021-05-24 DIAGNOSIS — N9489 Other specified conditions associated with female genital organs and menstrual cycle: Secondary | ICD-10-CM

## 2021-05-24 LAB — COMPREHENSIVE METABOLIC PANEL
ALT: 89 U/L — ABNORMAL HIGH (ref 0–44)
AST: 129 U/L — ABNORMAL HIGH (ref 15–41)
Albumin: 2.7 g/dL — ABNORMAL LOW (ref 3.5–5.0)
Alkaline Phosphatase: 194 U/L — ABNORMAL HIGH (ref 38–126)
Anion gap: 7 (ref 5–15)
BUN: 10 mg/dL (ref 6–20)
CO2: 18 mmol/L — ABNORMAL LOW (ref 22–32)
Calcium: 8 mg/dL — ABNORMAL LOW (ref 8.9–10.3)
Chloride: 110 mmol/L (ref 98–111)
Creatinine, Ser: 1.58 mg/dL — ABNORMAL HIGH (ref 0.44–1.00)
GFR, Estimated: 42 mL/min — ABNORMAL LOW (ref >60)
Glucose, Bld: 310 mg/dL — ABNORMAL HIGH (ref 70–99)
Potassium: 2.9 mmol/L — ABNORMAL LOW (ref 3.5–5.1)
Sodium: 135 mmol/L (ref 135–145)
Total Bilirubin: 0.7 mg/dL (ref 0.3–1.2)
Total Protein: 5.8 g/dL — ABNORMAL LOW (ref 6.5–8.1)

## 2021-05-24 LAB — HEPATITIS PANEL, ACUTE
HCV Ab: NONREACTIVE
Hep A IgM: NONREACTIVE
Hep B C IgM: NONREACTIVE
Hepatitis B Surface Ag: NONREACTIVE

## 2021-05-24 LAB — CBC WITH DIFFERENTIAL/PLATELET
Abs Immature Granulocytes: 0.02 10*3/uL (ref 0.00–0.07)
Basophils Absolute: 0.1 10*3/uL (ref 0.0–0.1)
Basophils Relative: 1 %
Eosinophils Absolute: 0.3 10*3/uL (ref 0.0–0.5)
Eosinophils Relative: 5 %
HCT: 29.1 % — ABNORMAL LOW (ref 36.0–46.0)
Hemoglobin: 9.6 g/dL — ABNORMAL LOW (ref 12.0–15.0)
Immature Granulocytes: 0 %
Lymphocytes Relative: 32 %
Lymphs Abs: 2.1 10*3/uL (ref 0.7–4.0)
MCH: 36 pg — ABNORMAL HIGH (ref 26.0–34.0)
MCHC: 33 g/dL (ref 30.0–36.0)
MCV: 109 fL — ABNORMAL HIGH (ref 80.0–100.0)
Monocytes Absolute: 0.8 10*3/uL (ref 0.1–1.0)
Monocytes Relative: 12 %
Neutro Abs: 3.2 10*3/uL (ref 1.7–7.7)
Neutrophils Relative %: 50 %
Platelets: 137 10*3/uL — ABNORMAL LOW (ref 150–400)
RBC: 2.67 MIL/uL — ABNORMAL LOW (ref 3.87–5.11)
RDW: 14.5 % (ref 11.5–15.5)
WBC: 6.4 10*3/uL (ref 4.0–10.5)
nRBC: 0 % (ref 0.0–0.2)

## 2021-05-24 LAB — GLUCOSE, CAPILLARY
Glucose-Capillary: 176 mg/dL — ABNORMAL HIGH (ref 70–99)
Glucose-Capillary: 180 mg/dL — ABNORMAL HIGH (ref 70–99)
Glucose-Capillary: 184 mg/dL — ABNORMAL HIGH (ref 70–99)
Glucose-Capillary: 203 mg/dL — ABNORMAL HIGH (ref 70–99)
Glucose-Capillary: 325 mg/dL — ABNORMAL HIGH (ref 70–99)
Glucose-Capillary: 425 mg/dL — ABNORMAL HIGH (ref 70–99)

## 2021-05-24 MED ORDER — SODIUM CHLORIDE 0.9 % IV SOLN
2.0000 g | INTRAVENOUS | Status: DC
  Administered 2021-05-24 – 2021-05-28 (×5): 2 g via INTRAVENOUS
  Filled 2021-05-24 (×5): qty 20

## 2021-05-24 MED ORDER — INSULIN ASPART 100 UNIT/ML IJ SOLN
5.0000 [IU] | Freq: Three times a day (TID) | INTRAMUSCULAR | Status: DC
  Administered 2021-05-24 (×3): 5 [IU] via SUBCUTANEOUS

## 2021-05-24 MED ORDER — METRONIDAZOLE 500 MG/100ML IV SOLN
500.0000 mg | Freq: Two times a day (BID) | INTRAVENOUS | Status: DC
  Administered 2021-05-24 – 2021-05-28 (×9): 500 mg via INTRAVENOUS
  Filled 2021-05-24 (×9): qty 100

## 2021-05-24 MED ORDER — DOXYCYCLINE HYCLATE 100 MG PO TABS
100.0000 mg | ORAL_TABLET | Freq: Two times a day (BID) | ORAL | Status: DC
  Administered 2021-05-24 – 2021-05-28 (×8): 100 mg via ORAL
  Filled 2021-05-24 (×8): qty 1

## 2021-05-24 MED ORDER — INSULIN GLARGINE-YFGN 100 UNIT/ML ~~LOC~~ SOLN
16.0000 [IU] | Freq: Every day | SUBCUTANEOUS | Status: DC
  Administered 2021-05-24: 16 [IU] via SUBCUTANEOUS
  Filled 2021-05-24: qty 0.16

## 2021-05-24 MED ORDER — LIDOCAINE HCL 1 % IJ SOLN
5.0000 mL | Freq: Once | INTRAMUSCULAR | Status: AC
  Administered 2021-05-24: 5 mL via INTRADERMAL
  Filled 2021-05-24: qty 5

## 2021-05-24 MED ORDER — PANTOPRAZOLE SODIUM 40 MG PO TBEC
40.0000 mg | DELAYED_RELEASE_TABLET | Freq: Every day | ORAL | Status: DC
  Administered 2021-05-24 – 2021-05-28 (×5): 40 mg via ORAL
  Filled 2021-05-24 (×5): qty 1

## 2021-05-24 MED ORDER — FLUCONAZOLE 150 MG PO TABS
150.0000 mg | ORAL_TABLET | Freq: Once | ORAL | Status: AC
  Administered 2021-05-24: 150 mg via ORAL
  Filled 2021-05-24: qty 1

## 2021-05-24 MED ORDER — POTASSIUM CHLORIDE CRYS ER 20 MEQ PO TBCR
40.0000 meq | EXTENDED_RELEASE_TABLET | ORAL | Status: AC
  Administered 2021-05-24 (×2): 40 meq via ORAL
  Filled 2021-05-24 (×2): qty 2

## 2021-05-24 MED ORDER — OXYCODONE-ACETAMINOPHEN 5-325 MG PO TABS
1.0000 | ORAL_TABLET | Freq: Four times a day (QID) | ORAL | Status: DC | PRN
  Administered 2021-05-24 – 2021-05-28 (×8): 1 via ORAL
  Filled 2021-05-24 (×8): qty 1

## 2021-05-24 MED ORDER — INSULIN ASPART 100 UNIT/ML IJ SOLN
11.0000 [IU] | Freq: Once | INTRAMUSCULAR | Status: AC
  Administered 2021-05-24: 11 [IU] via SUBCUTANEOUS

## 2021-05-24 NOTE — Consult Note (Signed)
OB/GYN Consult Note  Referring Provider: Hospitalist team, Irine Seal, MD  Anna Garcia is a 41 y.o. G1P0010 admitted for inpatient glycemic control secondary to Type 1 DM. Gyn consulted for evaluation and treatment of possible vulvar abscess.   Pt notes 4 day history of vulvar mass/pain.  She states the area is firm and discolored, but not currently painful.  She notes occasional hot and cold spells, but she has had those prior to her admission.  Pt noted a similar lesion higher up in the left inguinal region.  She denies any drainage of pus from the lesion.  The patient has been treating the lesion with intermittent warm compresses      Past Medical History:  Diagnosis Date   Diabetes mellitus    Gastroparesis    Hypertension    Neuropathy     History reviewed. No pertinent surgical history.  OB History  No obstetric history on file.    Social History   Socioeconomic History   Marital status: Single    Spouse name: Not on file   Number of children: Not on file   Years of education: Not on file   Highest education level: Not on file  Occupational History   Not on file  Tobacco Use   Smoking status: Former    Packs/day: 0.50    Types: Cigarettes   Smokeless tobacco: Never  Vaping Use   Vaping Use: Never used  Substance and Sexual Activity   Alcohol use: No   Drug use: No   Sexual activity: Not on file  Other Topics Concern   Not on file  Social History Narrative   Not on file   Social Determinants of Health   Financial Resource Strain: Not on file  Food Insecurity: Not on file  Transportation Needs: Not on file  Physical Activity: Not on file  Stress: Not on file  Social Connections: Not on file    Family History  Problem Relation Age of Onset   Hypertension Mother    Dementia Mother    Heart attack Father    Hypertension Father    Heart disease Father    Hypertension Brother    Stroke Maternal Grandfather     Medications Prior to  Admission  Medication Sig Dispense Refill Last Dose   albuterol (VENTOLIN HFA) 108 (90 Base) MCG/ACT inhaler Inhale 2 puffs into the lungs every 6 (six) hours as needed for wheezing or shortness of breath.   unk   buprenorphine (BUTRANS) 5 MCG/HR PTWK Place 1 patch onto the skin once a week.   Past Week   cholecalciferol (VITAMIN D3) 25 MCG (1000 UNIT) tablet Take 1,000 Units by mouth daily.   05/20/2021   DEXILANT 60 MG capsule Take 60 mg by mouth daily.   05/20/2021   famotidine (PEPCID) 40 MG tablet Take 40 mg by mouth 2 (two) times daily.   05/21/2021   insulin glargine (LANTUS SOLOSTAR) 100 UNIT/ML Solostar Pen Inject 12 Units into the skin in the morning and at bedtime. (Patient taking differently: Inject 14 Units into the skin daily.) 15 mL 11 05/20/2021   insulin glulisine (APIDRA SOLOSTAR) 100 UNIT/ML Solostar Pen Inject 1-15 Units into the skin See admin instructions. Changes as per carb intake as needed high CBG   05/21/2021   levalbuterol (XOPENEX HFA) 45 MCG/ACT inhaler Inhale 1 puff into the lungs every 6 (six) hours as needed for wheezing.   05/21/2021   losartan (COZAAR) 25 MG tablet Take 1 tablet (  25 mg total) by mouth daily.   05/20/2021   mometasone-formoterol (DULERA) 100-5 MCG/ACT AERO Inhale 2 puffs into the lungs 2 (two) times daily. 1 each 1 05/21/2021   montelukast (SINGULAIR) 10 MG tablet Take 10 mg by mouth at bedtime.   05/20/2021   Multiple Vitamins-Minerals (MULTIVITAMIN WITH MINERALS) tablet Take 1 tablet by mouth daily.   05/20/2021   rosuvastatin (CRESTOR) 20 MG tablet Take 20 mg by mouth daily.   05/20/2021   bethanechol (URECHOLINE) 5 MG tablet Take 5 mg by mouth 3 (three) times daily. (Patient not taking: Reported on 05/21/2021)   Not Taking   fluconazole (DIFLUCAN) 150 MG tablet Take 150 mg by mouth once.       No Known Allergies  Review of Systems: Negative except for what is mentioned in HPI.     Physical Exam: BP 112/77 (BP Location: Right Arm)    Pulse (!) 102     Temp 97.8 F (36.6 C) (Oral)    Resp 18    Ht 5\' 4"  (1.626 m)    Wt 74.2 kg    LMP  (LMP Unknown)    SpO2 100%    BMI 28.08 kg/m  CONSTITUTIONAL: Well-developed, well-nourished female in no acute distress.  HENT:  Normocephalic, atraumatic, External right and left ear normal. Oropharynx is clear and moist EYES: Conjunctivae and EOM are normal.  NECK: Normal range of motion, supple, no masses SKIN: Skin is warm and dry. No rash noted. Not diaphoretic. No erythema. No pallor. [place]: Alert and oriented to person, place, and time. Normal reflexes, muscle tone coordination. No cranial nerve deficit noted. PSYCHIATRIC: Normal mood and affect. Normal behavior. Normal judgment and thought content. CARDIOVASCULAR: Normal heart rate noted, regular rhythm RESPIRATORY: Effort and breath sounds normal, no problems with respiration noted ABDOMEN: Soft,nontender, nondistended PELVIC: external:  left labia minora markedly enlarged when compared to right.  Labia is somewhat hyperpigmented and tender to the touch.  No extension of erythema to the mons or lateral thigh. MUSCULOSKELETAL: Normal range of motion. No edema and no tenderness. 2+ distal pulses.  Pelvic exam done with female chaperone present.  Pertinent Labs/Studies:   Results for orders placed or performed during the hospital encounter of 05/21/21 (from the past 72 hour(s))  Glucose, capillary     Status: Abnormal   Collection Time: 05/21/21  5:44 PM  Result Value Ref Range   Glucose-Capillary 191 (H) 70 - 99 mg/dL    Comment: Glucose reference range applies only to samples taken after fasting for at least 8 hours.  Glucose, capillary     Status: Abnormal   Collection Time: 05/21/21  6:43 PM  Result Value Ref Range   Glucose-Capillary 177 (H) 70 - 99 mg/dL    Comment: Glucose reference range applies only to samples taken after fasting for at least 8 hours.  Basic metabolic panel     Status: Abnormal   Collection Time: 05/21/21  7:01 PM   Result Value Ref Range   Sodium 135 135 - 145 mmol/L   Potassium 3.2 (L) 3.5 - 5.1 mmol/L   Chloride 107 98 - 111 mmol/L   CO2 18 (L) 22 - 32 mmol/L   Glucose, Bld 183 (H) 70 - 99 mg/dL    Comment: Glucose reference range applies only to samples taken after fasting for at least 8 hours.   BUN 20 6 - 20 mg/dL   Creatinine, Ser 2.40 (H) 0.44 - 1.00 mg/dL   Calcium 9.2 8.9 - 10.3  mg/dL   GFR, Estimated 26 (L) >60 mL/min    Comment: (NOTE) Calculated using the CKD-EPI Creatinine Equation (2021)    Anion gap 10 5 - 15    Comment: Performed at [facility], [place] [address], [place], [place] [address]  Glucose, capillary     Status: Abnormal   Collection Time: 05/21/21  7:39 PM  Result Value Ref Range   Glucose-Capillary 171 (H) 70 - 99 mg/dL    Comment: Glucose reference range applies only to samples taken after fasting for at least 8 hours.  Glucose, capillary     Status: Abnormal   Collection Time: 05/21/21  8:42 PM  Result Value Ref Range   Glucose-Capillary 124 (H) 70 - 99 mg/dL    Comment: Glucose reference range applies only to samples taken after fasting for at least 8 hours.  Basic metabolic panel     Status: Abnormal   Collection Time: 05/21/21  9:15 PM  Result Value Ref Range   Sodium 135 135 - 145 mmol/L   Potassium 2.8 (L) 3.5 - 5.1 mmol/L   Chloride 106 98 - 111 mmol/L   CO2 18 (L) 22 - 32 mmol/L   Glucose, Bld 165 (H) 70 - 99 mg/dL    Comment: Glucose reference range applies only to samples taken after fasting for at least 8 hours.   BUN 19 6 - 20 mg/dL   Creatinine, Ser 2.17 (H) 0.44 - 1.00 mg/dL   Calcium 9.1 8.9 - 10.3 mg/dL   GFR, Estimated 29 (L) >60 mL/min    Comment: (NOTE) Calculated using the CKD-EPI Creatinine Equation (2021)    Anion gap 11 5 - 15    Comment: Performed at [facility], [place] [address], [place], [place] [address]  Beta-hydroxybutyric acid     Status: Abnormal   Collection Time: 05/21/21  9:15 PM   Result Value Ref Range   Beta-Hydroxybutyric Acid 2.13 (H) 0.05 - 0.27 mmol/L    Comment: Performed at [facility], [place] [address] [place], [place] [address]  Glucose, capillary     Status: Abnormal   Collection Time: 05/21/21  9:50 PM  Result Value Ref Range   Glucose-Capillary 191 (H) 70 - 99 mg/dL    Comment: Glucose reference range applies only to samples taken after fasting for at least 8 hours.  Glucose, capillary     Status: Abnormal   Collection Time: 05/21/21 10:42 PM  Result Value Ref Range   Glucose-Capillary 192 (H) 70 - 99 mg/dL    Comment: Glucose reference range applies only to samples taken after fasting for at least 8 hours.  Glucose, capillary     Status: Abnormal   Collection Time: 05/21/21 11:48 PM  Result Value Ref Range   Glucose-Capillary 169 (H) 70 - 99 mg/dL    Comment: Glucose reference range applies only to samples taken after fasting for at least 8 hours.  Glucose, capillary     Status: Abnormal   Collection Time: 05/22/21 12:43 AM  Result Value Ref Range   Glucose-Capillary 174 (H) 70 - 99 mg/dL    Comment: Glucose reference range applies only to samples taken after fasting for at least 8 hours.  CBC     Status: Abnormal   Collection Time: 05/22/21  1:16 AM  Result Value Ref Range   WBC 6.8 4.0 - 10.5 K/uL   RBC 3.05 (L) 3.87 - 5.11 MIL/uL   Hemoglobin 10.8 (L) 12.0 - 15.0 g/dL  HCT 31.9 (L) 36.0 - 46.0 %   MCV 104.6 (H) 80.0 - 100.0 fL   MCH 35.4 (H) 26.0 - 34.0 pg   MCHC 33.9 30.0 - 36.0 g/dL   RDW 13.4 11.5 - 15.5 %   Platelets 160 150 - 400 K/uL   nRBC 0.0 0.0 - 0.2 %    Comment: Performed at [facility], [place] [address], [place], [place] [address]  Basic metabolic panel     Status: Abnormal   Collection Time: 05/22/21  1:16 AM  Result Value Ref Range   Sodium 135 135 - 145 mmol/L   Potassium 2.8 (L) 3.5 - 5.1 mmol/L   Chloride 107 98 - 111 mmol/L   CO2 17 (L) 22 - 32 mmol/L   Glucose, Bld 166 (H)  70 - 99 mg/dL    Comment: Glucose reference range applies only to samples taken after fasting for at least 8 hours.   BUN 18 6 - 20 mg/dL   Creatinine, Ser 2.14 (H) 0.44 - 1.00 mg/dL   Calcium 9.1 8.9 - 10.3 mg/dL   GFR, Estimated 29 (L) >60 mL/min    Comment: (NOTE) Calculated using the CKD-EPI Creatinine Equation (2021)    Anion gap 11 5 - 15    Comment: Performed at [facility], [place] [address] [place], [place] [address]  Magnesium     Status: Abnormal   Collection Time: 05/22/21  1:16 AM  Result Value Ref Range   Magnesium 1.6 (L) 1.7 - 2.4 mg/dL    Comment: Performed at [facility], [place] [address] [place], [place] [address]  Glucose, capillary     Status: Abnormal   Collection Time: 05/22/21  1:43 AM  Result Value Ref Range   Glucose-Capillary 155 (H) 70 - 99 mg/dL    Comment: Glucose reference range applies only to samples taken after fasting for at least 8 hours.  Glucose, capillary     Status: Abnormal   Collection Time: 05/22/21  3:45 AM  Result Value Ref Range   Glucose-Capillary 166 (H) 70 - 99 mg/dL    Comment: Glucose reference range applies only to samples taken after fasting for at least 8 hours.  Comprehensive metabolic panel     Status: Abnormal   Collection Time: 05/22/21  5:17 AM  Result Value Ref Range   Sodium 137 135 - 145 mmol/L   Potassium 2.7 (LL) 3.5 - 5.1 mmol/L    Comment: REPEATED TO VERIFY CRITICAL RESULT CALLED TO, READ BACK BY AND VERIFIED WITH: SHAW,C. RN AT 9024 05/22/21 MULLINS,T    Chloride 110 98 - 111 mmol/L   CO2 18 (L) 22 - 32 mmol/L   Glucose, Bld 102 (H) 70 - 99 mg/dL    Comment: Glucose reference range applies only to samples taken after fasting for at least 8 hours.   BUN 17 6 - 20 mg/dL   Creatinine, Ser 2.06 (H) 0.44 - 1.00 mg/dL   Calcium 9.1 8.9 - 10.3 mg/dL   Total Protein 6.1 (L) 6.5 - 8.1 g/dL   Albumin 2.8 (L) 3.5 - 5.0 g/dL   AST 165 (H) 15 - 41 U/L   ALT 90 (H) 0 - 44 U/L    Alkaline Phosphatase 213 (H) 38 - 126 U/L   Total Bilirubin 0.9 0.3 - 1.2 mg/dL   GFR, Estimated 31 (L) >60 mL/min    Comment: (NOTE) Calculated using the CKD-EPI Creatinine Equation (2021)    Anion gap 9 5 -  15    Comment: Performed at [facility], [place] [address] [place], [place] [address]  Beta-hydroxybutyric acid     Status: Abnormal   Collection Time: 05/22/21  5:17 AM  Result Value Ref Range   Beta-Hydroxybutyric Acid 0.48 (H) 0.05 - 0.27 mmol/L    Comment: Performed at [facility], [place] [address] [place], [place] [address]  Glucose, capillary     Status: None   Collection Time: 05/22/21  5:42 AM  Result Value Ref Range   Glucose-Capillary 86 70 - 99 mg/dL    Comment: Glucose reference range applies only to samples taken after fasting for at least 8 hours.  Glucose, capillary     Status: Abnormal   Collection Time: 05/22/21  6:15 AM  Result Value Ref Range   Glucose-Capillary 63 (L) 70 - 99 mg/dL    Comment: Glucose reference range applies only to samples taken after fasting for at least 8 hours.  Glucose, capillary     Status: Abnormal   Collection Time: 05/22/21  6:18 AM  Result Value Ref Range   Glucose-Capillary 65 (L) 70 - 99 mg/dL    Comment: Glucose reference range applies only to samples taken after fasting for at least 8 hours.  Glucose, capillary     Status: Abnormal   Collection Time: 05/22/21  6:44 AM  Result Value Ref Range   Glucose-Capillary 109 (H) 70 - 99 mg/dL    Comment: Glucose reference range applies only to samples taken after fasting for at least 8 hours.  Glucose, capillary     Status: Abnormal   Collection Time: 05/22/21  7:43 AM  Result Value Ref Range   Glucose-Capillary 150 (H) 70 - 99 mg/dL    Comment: Glucose reference range applies only to samples taken after fasting for at least 8 hours.   Comment 1 Notify RN    Comment 2 Document in Chart   Glucose, capillary     Status: Abnormal   Collection  Time: 05/22/21  8:47 AM  Result Value Ref Range   Glucose-Capillary 208 (H) 70 - 99 mg/dL    Comment: Glucose reference range applies only to samples taken after fasting for at least 8 hours.   Comment 1 Notify RN    Comment 2 Document in Chart   Glucose, capillary     Status: Abnormal   Collection Time: 05/22/21  9:16 AM  Result Value Ref Range   Glucose-Capillary 196 (H) 70 - 99 mg/dL    Comment: Glucose reference range applies only to samples taken after fasting for at least 8 hours.   Comment 1 Notify RN    Comment 2 Document in Chart   Basic metabolic panel     Status: Abnormal   Collection Time: 05/22/21  9:34 AM  Result Value Ref Range   Sodium 137 135 - 145 mmol/L   Potassium 5.7 (H) 3.5 - 5.1 mmol/L    Comment: DELTA CHECK NOTED SLIGHT HEMOLYSIS    Chloride 114 (H) 98 - 111 mmol/L   CO2 14 (L) 22 - 32 mmol/L   Glucose, Bld 230 (H) 70 - 99 mg/dL    Comment: Glucose reference range applies only to samples taken after fasting for at least 8 hours.   BUN 15 6 - 20 mg/dL   Creatinine, Ser 1.93 (H) 0.44 - 1.00 mg/dL   Calcium 8.8 (L) 8.9 - 10.3 mg/dL   GFR, Estimated 33 (L) >60 mL/min    Comment: (NOTE) Calculated  using the CKD-EPI Creatinine Equation (2021)    Anion gap 9 5 - 15    Comment: Performed at [facility], [place] [address] [place], [place] [address]  Magnesium     Status: None   Collection Time: 05/22/21  9:34 AM  Result Value Ref Range   Magnesium 2.2 1.7 - 2.4 mg/dL    Comment: Performed at [facility], [place] [address] [place],  [address]  Glucose, capillary     Status: Abnormal   Collection Time: 05/22/21 10:15 AM  Result Value Ref Range   Glucose-Capillary 188 (H) 70 - 99 mg/dL    Comment: Glucose reference range applies only to samples taken after fasting for at least 8 hours.   Comment 1 Notify RN    Comment 2 Document in Chart   Glucose, capillary     Status: Abnormal   Collection Time: 05/22/21 11:17 AM   Result Value Ref Range   Glucose-Capillary 197 (H) 70 - 99 mg/dL    Comment: Glucose reference range applies only to samples taken after fasting for at least 8 hours.   Comment 1 Notify RN    Comment 2 Document in Chart   Glucose, capillary     Status: Abnormal   Collection Time: 05/22/21 11:20 AM  Result Value Ref Range   Glucose-Capillary 183 (H) 70 - 99 mg/dL    Comment: Glucose reference range applies only to samples taken after fasting for at least 8 hours.   Comment 1 Notify RN    Comment 2 Document in Chart   Glucose, capillary     Status: Abnormal   Collection Time: 05/22/21 12:41 PM  Result Value Ref Range   Glucose-Capillary 172 (H) 70 - 99 mg/dL    Comment: Glucose reference range applies only to samples taken after fasting for at least 8 hours.   Comment 1 Notify RN    Comment 2 Document in Chart   Glucose, capillary     Status: Abnormal   Collection Time: 05/22/21  1:48 PM  Result Value Ref Range   Glucose-Capillary 167 (H) 70 - 99 mg/dL    Comment: Glucose reference range applies only to samples taken after fasting for at least 8 hours.   Comment 1 Notify RN    Comment 2 Document in Chart   Glucose, capillary     Status: Abnormal   Collection Time: 05/22/21  3:25 PM  Result Value Ref Range   Glucose-Capillary 165 (H) 70 - 99 mg/dL    Comment: Glucose reference range applies only to samples taken after fasting for at least 8 hours.   Comment 1 Notify RN    Comment 2 Document in Chart   Glucose, capillary     Status: Abnormal   Collection Time: 05/22/21  5:04 PM  Result Value Ref Range   Glucose-Capillary 191 (H) 70 - 99 mg/dL    Comment: Glucose reference range applies only to samples taken after fasting for at least 8 hours.   Comment 1 Notify RN    Comment 2 Document in Chart   Glucose, capillary     Status: Abnormal   Collection Time: 05/22/21  9:27 PM  Result Value Ref Range   Glucose-Capillary 176 (H) 70 - 99 mg/dL    Comment: Glucose reference range  applies only to samples taken after fasting for at least 8 hours.  Basic metabolic panel     Status: Abnormal   Collection Time: 05/23/21  4:21 AM  Result Value Ref Range   Sodium 138 135 - 145 mmol/L   Potassium 3.2 (L) 3.5 - 5.1 mmol/L    Comment: DELTA CHECK NOTED   Chloride 114 (H) 98 - 111 mmol/L   CO2 16 (L) 22 - 32 mmol/L   Glucose, Bld 231 (H) 70 - 99 mg/dL    Comment: Glucose reference range applies only to samples taken after fasting for at least 8 hours.   BUN 10 6 - 20 mg/dL   Creatinine, Ser 1.71 (H) 0.44 - 1.00 mg/dL   Calcium 8.3 (L) 8.9 - 10.3 mg/dL   GFR, Estimated 39 (L) >60 mL/min    Comment: (NOTE) Calculated using the CKD-EPI Creatinine Equation (2021)    Anion gap 8 5 - 15    Comment: Performed at [facility], [place] [address] [place], [place] [address]  CBC     Status: Abnormal   Collection Time: 05/23/21  4:21 AM  Result Value Ref Range   WBC 5.4 4.0 - 10.5 K/uL   RBC 2.74 (L) 3.87 - 5.11 MIL/uL   Hemoglobin 9.8 (L) 12.0 - 15.0 g/dL   HCT 29.3 (L) 36.0 - 46.0 %   MCV 106.9 (H) 80.0 - 100.0 fL   MCH 35.8 (H) 26.0 - 34.0 pg   MCHC 33.4 30.0 - 36.0 g/dL   RDW 14.0 11.5 - 15.5 %   Platelets 131 (L) 150 - 400 K/uL   nRBC 0.0 0.0 - 0.2 %    Comment: Performed at [facility], [place] [address], [place], [place] [address]  Magnesium     Status: None   Collection Time: 05/23/21  4:21 AM  Result Value Ref Range   Magnesium 2.0 1.7 - 2.4 mg/dL    Comment: Performed at [facility], [place] [address] [place], [place] [address]  Hepatic function panel     Status: Abnormal   Collection Time: 05/23/21  4:21 AM  Result Value Ref Range   Total Protein 5.7 (L) 6.5 - 8.1 g/dL   Albumin 2.6 (L) 3.5 - 5.0 g/dL   AST 226 (H) 15 - 41 U/L   ALT 103 (H) 0 - 44 U/L   Alkaline Phosphatase 200 (H) 38 - 126 U/L   Total Bilirubin 0.6 0.3 - 1.2 mg/dL   Bilirubin, Direct 0.2 0.0 - 0.2 mg/dL   Indirect Bilirubin 0.4 0.3 -  0.9 mg/dL    Comment: Performed at [facility], [place] [address], [place], [place] [address]  Glucose, capillary     Status: Abnormal   Collection Time: 05/23/21  7:34 AM  Result Value Ref Range   Glucose-Capillary 249 (H) 70 - 99 mg/dL    Comment: Glucose reference range applies only to samples taken after fasting for at least 8 hours.  Glucose, capillary     Status: Abnormal   Collection Time: 05/23/21 11:07 AM  Result Value Ref Range   Glucose-Capillary 291 (H) 70 - 99 mg/dL    Comment: Glucose reference range applies only to samples taken after fasting for at least 8 hours.  Glucose, capillary     Status: Abnormal   Collection Time: 05/23/21  4:14 PM  Result Value Ref Range   Glucose-Capillary 409 (H) 70 - 99 mg/dL    Comment: Glucose reference range applies only to samples taken after fasting for at least 8 hours.  Glucose, capillary     Status: Abnormal   Collection Time: 05/23/21  5:00 PM  Result Value Ref Range   Glucose-Capillary 386 (H) 70 - 99 mg/dL    Comment: Glucose reference range applies only to samples taken after fasting for at least 8 hours.  Glucose, capillary     Status: Abnormal   Collection Time: 05/23/21  9:16 PM  Result Value Ref Range   Glucose-Capillary 303 (H) 70 - 99 mg/dL    Comment: Glucose reference range applies only to samples taken after fasting for at least 8 hours.  Comprehensive metabolic panel     Status: Abnormal   Collection Time: 05/24/21  4:27 AM  Result Value Ref Range   Sodium 135 135 - 145 mmol/L   Potassium 2.9 (L) 3.5 - 5.1 mmol/L   Chloride 110 98 - 111 mmol/L   CO2 18 (L) 22 - 32 mmol/L   Glucose, Bld 310 (H) 70 - 99 mg/dL    Comment: Glucose reference range applies only to samples taken after fasting for at least 8 hours.   BUN 10 6 - 20 mg/dL   Creatinine, Ser 1.58 (H) 0.44 - 1.00 mg/dL   Calcium 8.0 (L) 8.9 - 10.3 mg/dL   Total Protein 5.8 (L) 6.5 - 8.1 g/dL   Albumin 2.7 (L) 3.5 - 5.0 g/dL   AST 129 (H)  15 - 41 U/L   ALT 89 (H) 0 - 44 U/L   Alkaline Phosphatase 194 (H) 38 - 126 U/L   Total Bilirubin 0.7 0.3 - 1.2 mg/dL   GFR, Estimated 42 (L) >60 mL/min    Comment: (NOTE) Calculated using the CKD-EPI Creatinine Equation (2021)    Anion gap 7 5 - 15    Comment: Performed at [facility], [place] [address], [place], [place] [address]  Hepatitis panel, acute     Status: None   Collection Time: 05/24/21  4:27 AM  Result Value Ref Range   Hepatitis B Surface Ag NON REACTIVE NON REACTIVE   HCV Ab NON REACTIVE NON REACTIVE    Comment: (NOTE) Nonreactive HCV antibody screen is consistent with no HCV infections,  unless recent infection is suspected or other evidence exists to indicate HCV infection.     Hep A IgM NON REACTIVE NON REACTIVE   Hep B C IgM NON REACTIVE NON REACTIVE    Comment: Performed at [facility], [place] [address] [place],  [address]  CBC with Differential/Platelet     Status: Abnormal   Collection Time: 05/24/21  4:27 AM  Result Value Ref Range   WBC 6.4 4.0 - 10.5 K/uL   RBC 2.67 (L) 3.87 - 5.11 MIL/uL   Hemoglobin 9.6 (L) 12.0 - 15.0 g/dL   HCT 29.1 (L) 36.0 - 46.0 %   MCV 109.0 (H) 80.0 - 100.0 fL   MCH 36.0 (H) 26.0 - 34.0 pg   MCHC 33.0 30.0 - 36.0 g/dL   RDW 14.5 11.5 - 15.5 %   Platelets 137 (L) 150 - 400 K/uL   nRBC 0.0 0.0 - 0.2 %   Neutrophils Relative % 50 %   Neutro Abs 3.2 1.7 - 7.7 K/uL   Lymphocytes Relative 32 %   Lymphs Abs 2.1 0.7 - 4.0 K/uL   Monocytes Relative 12 %   Monocytes Absolute 0.8 0.1 - 1.0 K/uL   Eosinophils Relative 5 %   Eosinophils Absolute 0.3 0.0 - 0.5 K/uL   Basophils Relative 1 %   Basophils Absolute 0.1 0.0 - 0.1 K/uL   Immature Granulocytes 0 %   Abs  Immature Granulocytes 0.02 0.00 - 0.07 K/uL    Comment: Performed at [facility], [place] [address], [place], [place] [address]  Glucose, capillary     Status: Abnormal   Collection Time: 05/24/21  7:27 AM  Result Value Ref  Range   Glucose-Capillary 325 (H) 70 - 99 mg/dL    Comment: Glucose reference range applies only to samples taken after fasting for at least 8 hours.  Glucose, capillary     Status: Abnormal   Collection Time: 05/24/21 11:23 AM  Result Value Ref Range   Glucose-Capillary 425 (H) 70 - 99 mg/dL    Comment: Glucose reference range applies only to samples taken after fasting for at least 8 hours.  Glucose, capillary     Status: Abnormal   Collection Time: 05/24/21  5:15 PM  Result Value Ref Range   Glucose-Capillary 180 (H) 70 - 99 mg/dL    Comment: Glucose reference range applies only to samples taken after fasting for at least 8 hours.   CLINICAL DATA:  Soft tissue infection/abscess suspected.   EXAM: CT PELVIS WITHOUT CONTRAST   TECHNIQUE: Multidetector CT imaging of the pelvis was performed following the standard protocol without intravenous contrast.   RADIATION DOSE REDUCTION: This exam was performed according to the departmental dose-optimization program which includes automated exposure control, adjustment of the mA and/or kV according to patient size and/or use of iterative reconstruction technique.   COMPARISON:  None.   FINDINGS: Urinary Tract:  Urinary bladder appears within normal limits.   Bowel: No evidence of bowel obstruction. No bowel wall edema visualized. Appendix is normal.   Vascular/Lymphatic: No pathologically enlarged lymph nodes. No significant vascular abnormality seen.   Reproductive: IUD in the uterus. No suspicious adnexal mass identified.   Other: Small to moderate amount of free fluid in the pelvis. No focal fluid collections identified in the soft tissues.   Musculoskeletal: No acute osseous abnormality identified. No suspicious bony lesions.   IMPRESSION: 1. No acute process or focal collection identified to suggest abscess, given limitations of noncontrast study. 2. Free fluid in the pelvis which is nonspecific, most  likely physiologic.        Assessment and Plan :AARTI MANKOWSKI is a 41 y.o. G1P0010. admitted for DKA secondary to uncontrolled type 1 diabetes. The left labia does feel like there may be some fluid inside or fluctuance.  Will perform linear vertical I and D of probable abscess followed by packing of the wound.  Wound cultures will be taken.    Pt is aware of risks and benefits of the procedure including bleeding, progression of the infection and involvement of other tissue.  Advise addition of doxycycline to the antibiotic regimen, either po or IV. Also suggest daily wet to dry dressing changes until the stab incision has healed.   Thank you for this consult, we will follow along, please call or re-consult with further questions.   For OB/GYN questions, please call the [facility] [place] at [place] Monday - Friday, 8 am - 5 pm: [phone] All other times: [phone]    Anna Garcia, M.D. Attending [place], [facility] for [employer], [place]

## 2021-05-24 NOTE — Procedures (Signed)
Vulvar abscess Incision and drainage  Enlarged abscess palpated in the left labia minora.  Written informed consent was obtained.  Discussed complications and possible outcomes of procedure including recurrence of infection, scarring leading to infection, bleeding, dyspareunia, distortion of anatomy. Procedure was performed at bedside in sterile fashion.   Patient was examined in the dorsal lithotomy position and mass was identified.  The area was prepped with Iodine and draped in a sterile manner. 1% Lidocaine (3 ml) was then used to infiltrate the left labia at the abscess site.  A  1.5 cm incision was made using a sterile scapel. Upon palpation of the mass, a moderate amount of bloody purulent drainage was expressed through the incision. A sterile q tip was used to break up loculations, which resulted in expression of more bloody purulent drainage.  Samples of the drainage were sent for cultures. The open abscess pocket was then copiously irrigated with normal saline, - Recommended Sitz baths bid and Motrin and Percocet for prn pain.     Advised nursing staff to have twice daily and PRN packing changes daily - after pt is discharged recommend home health if needed for packing changes and follow up with [facility] [place] [facility] in 3-4 weeks.   Lynnda Shields, MD

## 2021-05-24 NOTE — Progress Notes (Signed)
PHARMACIST - PHYSICIAN COMMUNICATION  DR:   Grandville Silos  CONCERNING: IV to Oral Route Change Policy  RECOMMENDATION: This patient is receiving Protonix by the intravenous route.  Based on criteria approved by the Pharmacy and Therapeutics Committee, the intravenous medication(s) is/are being converted to the equivalent oral dose form(s).   DESCRIPTION: These criteria include: The patient is eating (either orally or via tube) and/or has been taking other orally administered medications for a least 24 hours The patient has no evidence of active gastrointestinal bleeding or impaired GI absorption (gastrectomy, short bowel, patient on TNA or NPO).  If you have questions about this conversion, please contact the Pharmacy Department    [phone] )  [facility], [place] PharmD, BCPS [facility] main pharmacy [phone] 05/24/2021 12:53 PM

## 2021-05-24 NOTE — Progress Notes (Signed)
Inpatient Diabetes Program Recommendations  AACE/ADA: New Consensus Statement on Inpatient Glycemic Control (2015)  Target Ranges:  Prepandial:   less than 140 mg/dL      Peak postprandial:   less than 180 mg/dL (1-2 hours)      Critically ill patients:  140 - 180 mg/dL   Lab Results  Component Value Date   GLUCAP 303 (H) 05/23/2021   HGBA1C 9.6 (H) 05/21/2021    Review of Glycemic Control   Current orders for Inpatient glycemic control:   Semglee 14 units QHS, Novolog 0-9 units TID with meals and 0-5 HS + 3 units TID  Inpatient Diabetes Program Recommendations:    Increase Novolog to 5 units TIC with meals Increase Semglee to 16 units QHS  Continue to titrate daily.  Follow trends.  Thank you. Lorenda Peck, RD, LDN, CDE Inpatient Diabetes Coordinator [phone]

## 2021-05-24 NOTE — Progress Notes (Signed)
Inpatient Diabetes Program Recommendations  AACE/ADA: New Consensus Statement on Inpatient Glycemic Control (2015)  Target Ranges:  Prepandial:   less than 140 mg/dL      Peak postprandial:   less than 180 mg/dL (1-2 hours)      Critically ill patients:  140 - 180 mg/dL   Lab Results  Component Value Date   GLUCAP 425 (H) 05/24/2021   HGBA1C 9.6 (H) 05/21/2021    Review of Glycemic Control  CBGs today: 325, 425 mg/dL. Received 28 units of Novolog thus far.  Current orders for Inpatient glycemic control: Lantus 16 QHS, Novolog 0-9 units TID with meals + 5 units TID for meal coverage  Spoke with pt at bedside regarding Lantus HS dose vs QAM dose at home. Pt ok with taking Lantus dose at night when discharged. Blood sugars elevated today. Continue with titration.  Inpatient Diabetes Program Recommendations:    Increase Lantus to 18 units QHS Increase Novolog to 6 units TID with meals  Follow up in am.  Thank you. Lorenda Peck, RD, LDN, CDE Inpatient Diabetes Coordinator [phone]

## 2021-05-24 NOTE — Progress Notes (Signed)
PROGRESS NOTE    Anna Garcia  OZY:[id number] DOB: 1980-06-23 DOA: 05/21/2021 PCP: Day, [name], MD    Chief Complaint  Patient presents with   Palpitations   Shortness of Breath    Brief Narrative:  41 year old female with past medical history for type 1 diabetes poorly controlled with secondary gastroparesis and peripheral neuropathy as well as hypertension who presented to the emergency room with palpitations, chest tightness and shortness of breath.  Patient found to be in DKA and admitted to the hospitalist service, being started on an insulin drip plus IV fluids.   By 1/31, anion gap corrected and DKA resolved.       Assessment & Plan:   Principal Problem:   DKA (diabetic ketoacidosis) ([place]) Active Problems:   Uncontrolled type 1 diabetes mellitus with hyperglycemia, with long-term current use of insulin (HCC)   AKI (acute kidney injury) ([place])   Transaminitis   Hypertension   Gastroparesis   DKA, type 1 (HCC)   CKD stage G3a/A3, GFR 45-59 and albumin creatinine ratio >300 mg/g (HCC)   Hypokalemia   Prolonged QT interval   Elevated brain natriuretic peptide (BNP) level   Overweight (BMI 25.0-29.9)   Labial swelling: Left  #1 DKA, POA/uncontrolled type 1 diabetes mellitus with neuropathy -Patient on presentation noted to be in DKA and placed on an insulin drip, IV fluids. -Anion gap closed and patient transitioned to subcutaneous insulin with sliding scale insulin. -Hemoglobin A1c 9.6 (05/21/2021) -CBG 325 this morning increasing to 425.   -Increase Semglee to 16 units daily. -Increase NovoLog meal coverage to 5 units 3 times daily.   -Continue Neurontin 300 mg 3 times daily to aid with neuropathy.   -Diabetes coordinator following.   2.  CKD stage IIIb -Stable.  3.  Metabolic acidosis -Patient with CKD stage IIIb, was in DKA and noted to be acidotic with some improvement with acidosis, anion gap closed, patient transitioned to subcutaneous insulin. -Improving  on bicarb tablets. -Follow  4.  Gastroparesis -Patient states history of gastroparesis, has tried Reglan in the past with no significant improvement. -Patient started on Neurontin per Dr. [place] Pink. -Follow-up.  5.  Hypertension -Blood pressure noted to be soft felt likely secondary to volume depletion. -Status post IV fluid bolus. -Follow.  6.  Left labial swelling/??  Bartholin cyst versus abscess -Patient with complaints of left labial swelling. -Patient examined with RN Alphonzo Lemmings as chaperone and area of induration with some redness and exquisite tenderness to palpation noted. -Case discussed with GYN who recommended pelvic CT for further evaluation and will formally consult on patient. -Place empirically on IV Rocephin, IV Flagyl pending GYN assessment. -Warm compresses.  7.  Hypokalemia -K-Dur 40 mEq p.o. every 4 hours x2 doses. -Repeat labs in AM.  8.  Elevated BNP, POA -Patient with no signs or symptoms of volume overload on examination. -2D echo with normal EF, no wall motion abnormalities. -Urine output not properly recorded.  9.  Transaminitis -??  Etiology. -LFTs trending down. -Acute hepatitis panel negative.   -Right upper quadrant ultrasound concerning for hepatic steatosis, hyperechoic mass in the right hepatic lobe possibly hemangioma. -Outpatient follow-up.  10.  Overweight -Lifestyle modification.   DVT prophylaxis: SCDs Code Status: Full Family Communication: Updated patient.  No family at bedside. Disposition:   Status is: Inpatient Remains inpatient appropriate because: Severity of illness.  Planned Discharge Destination: Home         Consultants:  GYN pending  Procedures:  Chest x-ray 05/21/2021 2D  echo 05/22/2021 CT pelvis pending Right upper quadrant ultrasound 05/24/2021   Antimicrobials:  IV Rocephin 05/24/2021>>> IV Flagyl 05/24/2021>>>>   Subjective: Patient with complaints of left vaginal swelling and pain.  No chest  pain.  No shortness of breath.  No abdominal pain.  Tolerating current diet.  Still with complaints of neuropathy.  CBG of 325 early this morning and increased to 425.  Objective: Vitals:   05/23/21 2119 05/24/21 0624 05/24/21 0757 05/24/21 1340  BP: 106/66 107/75  112/77  Pulse: (!) 104 87  (!) 102  Resp: 18 18  18   Temp: 98.1 F (36.7 C) 98.3 F (36.8 C)  97.8 F (36.6 C)  TempSrc: Oral Oral  Oral  SpO2: 98% 99% 98% 100%  Weight:      Height:        Intake/Output Summary (Last 24 hours) at 05/24/2021 1546 Last data filed at 05/24/2021 0600 Gross per 24 hour  Intake 240 ml  Output 0 ml  Net 240 ml   Filed Weights   05/21/21 1350 05/21/21 1617  Weight: 77.1 kg 74.2 kg    Examination:  General exam: NAD. Respiratory system: Lungs clear to auscultation bilaterally.  No wheezes, no crackles, no rhonchi.  Normal respiratory effort.  Speaking in full sentences. Cardiovascular system: Regular rate rhythm no murmurs rubs or gallops.  No JVD.  No lower extremity edema.  Gastrointestinal system: Abdomen is soft, nontender, nondistended, positive bowel sounds.  No rebound.  No guarding.  Central nervous system: Alert and oriented. No focal neurological deficits. Genitourinary: Left labial swelling with some redness/duskiness, indurated, significantly tender to palpation. Extremities: Symmetric 5 x 5 power. Skin: No rashes, lesions or ulcers Psychiatry: Judgement and insight appear normal. Mood & affect appropriate.     Data Reviewed: I have personally reviewed following labs and imaging studies  CBC: Recent Labs  Lab 05/21/21 1110 05/22/21 0116 05/23/21 0421 05/24/21 0427  WBC 10.2 6.8 5.4 6.4  NEUTROABS 6.0  --   --  3.2  HGB 12.8 10.8* 9.8* 9.6*  HCT 39.3 31.9* 29.3* 29.1*  MCV 108.0* 104.6* 106.9* 109.0*  PLT 251 160 131* 137*    Basic Metabolic Panel: Recent Labs  Lab 05/21/21 1110 05/21/21 1345 05/21/21 1901 05/22/21 0116 05/22/21 0517 05/22/21 0934  05/23/21 0421 05/24/21 0427  NA 134* 132*   < > 135 137 137 138 135  K 3.0* 2.7*   < > 2.8* 2.7* 5.7* 3.2* 2.9*  CL 100 101   < > 107 110 114* 114* 110  CO2 10* 12*   < > 17* 18* 14* 16* 18*  GLUCOSE 368* 293*   < > 166* 102* 230* 231* 310*  BUN 24* 22*   < > 18 17 15 10 10   CREATININE 3.07* 2.83*   < > 2.14* 2.06* 1.93* 1.71* 1.58*  CALCIUM 10.0 9.3   < > 9.1 9.1 8.8* 8.3* 8.0*  MG 2.0 1.8  --  1.6*  --  2.2 2.0  --    < > = values in this interval not displayed.    GFR: Estimated Creatinine Clearance: 47.2 mL/min (A) (by C-G formula based on SCr of 1.58 mg/dL (H)).  Liver Function Tests: Recent Labs  Lab 05/21/21 1110 05/22/21 0517 05/23/21 0421 05/24/21 0427  AST 192* 165* 226* 129*  ALT 122* 90* 103* 89*  ALKPHOS 299* 213* 200* 194*  BILITOT 1.7* 0.9 0.6 0.7  PROT 8.0 6.1* 5.7* 5.8*  ALBUMIN 3.8 2.8* 2.6* 2.7*  CBG: Recent Labs  Lab 05/23/21 1614 05/23/21 1700 05/23/21 2116 05/24/21 0727 05/24/21 1123  GLUCAP 409* 386* 303* 325* 425*     Recent Results (from the past 240 hour(s))  Resp Panel by RT-PCR (Flu A&B, Covid) Nasopharyngeal Swab     Status: None   Collection Time: 05/21/21 11:41 AM   Specimen: Nasopharyngeal Swab; Nasopharyngeal(NP) swabs in vial transport medium  Result Value Ref Range Status   SARS Coronavirus 2 by RT PCR NEGATIVE NEGATIVE Final    Comment: (NOTE) SARS-CoV-2 target nucleic acids are NOT DETECTED.  The SARS-CoV-2 RNA is generally detectable in upper respiratory specimens during the acute phase of infection. The lowest concentration of SARS-CoV-2 viral copies this assay can detect is 138 copies/mL. A negative result does not preclude SARS-Cov-2 infection and should not be used as the sole basis for treatment or other patient management decisions. A negative result may occur with  improper specimen collection/handling, submission of specimen other than nasopharyngeal swab, presence of viral mutation(s) within the areas  targeted by this assay, and inadequate number of viral copies(<138 copies/mL). A negative result must be combined with clinical observations, patient history, and epidemiological information. The expected result is Negative.  Fact Sheet for Patients:  EntrepreneurPulse.com.au  Fact Sheet for Healthcare Providers:  IncredibleEmployment.be  This test is no t yet approved or cleared by the [place] FDA and  has been authorized for detection and/or diagnosis of SARS-CoV-2 by FDA under an Emergency Use Authorization (EUA). This EUA will remain  in effect (meaning this test can be used) for the duration of the COVID-19 declaration under Section 564(b)(1) of the Act, 21 U.S.C.section 360bbb-3(b)(1), unless the authorization is terminated  or revoked sooner.       Influenza A by PCR NEGATIVE NEGATIVE Final   Influenza B by PCR NEGATIVE NEGATIVE Final    Comment: (NOTE) The Xpert Xpress SARS-CoV-2/FLU/RSV plus assay is intended as an aid in the diagnosis of influenza from Nasopharyngeal swab specimens and should not be used as a sole basis for treatment. Nasal washings and aspirates are unacceptable for Xpert Xpress SARS-CoV-2/FLU/RSV testing.  Fact Sheet for Patients: EntrepreneurPulse.com.au  Fact Sheet for Healthcare Providers: IncredibleEmployment.be  This test is not yet approved or cleared by the [place] FDA and has been authorized for detection and/or diagnosis of SARS-CoV-2 by FDA under an Emergency Use Authorization (EUA). This EUA will remain in effect (meaning this test can be used) for the duration of the COVID-19 declaration under Section 564(b)(1) of the Act, 21 U.S.C. section 360bbb-3(b)(1), unless the authorization is terminated or revoked.  Performed at [facility], [place] [address], [place],  [place] [address]   MRSA Next Gen by PCR, Nasal     Status: None    Collection Time: 05/21/21  5:08 PM   Specimen: Nasal Mucosa; Nasal Swab  Result Value Ref Range Status   MRSA by PCR Next Gen NOT DETECTED NOT DETECTED Final    Comment: (NOTE) The GeneXpert MRSA Assay (FDA approved for NASAL specimens only), is one component of a comprehensive MRSA colonization surveillance program. It is not intended to diagnose MRSA infection nor to guide or monitor treatment for MRSA infections. Test performance is not FDA approved in patients less than 79 years old. Performed at [facility], [place] [address] [place], [place] [address]          Radiology Studies: CT PELVIS WO CONTRAST  Result Date: 05/24/2021 CLINICAL DATA:  Soft tissue infection/abscess suspected. EXAM:  CT PELVIS WITHOUT CONTRAST TECHNIQUE: Multidetector CT imaging of the pelvis was performed following the standard protocol without intravenous contrast. RADIATION DOSE REDUCTION: This exam was performed according to the departmental dose-optimization program which includes automated exposure control, adjustment of the mA and/or kV according to patient size and/or use of iterative reconstruction technique. COMPARISON:  None. FINDINGS: Urinary Tract:  Urinary bladder appears within normal limits. Bowel: No evidence of bowel obstruction. No bowel wall edema visualized. Appendix is normal. Vascular/Lymphatic: No pathologically enlarged lymph nodes. No significant vascular abnormality seen. Reproductive: IUD in the uterus. No suspicious adnexal mass identified. Other: Small to moderate amount of free fluid in the pelvis. No focal fluid collections identified in the soft tissues. Musculoskeletal: No acute osseous abnormality identified. No suspicious bony lesions. IMPRESSION: 1. No acute process or focal collection identified to suggest abscess, given limitations of noncontrast study. 2. Free fluid in the pelvis which is nonspecific, most likely physiologic. Electronically Signed   By: Ofilia Neas M.D.   On: 05/24/2021 14:52   US Abdomen Limited RUQ (LIVER/GB)  Result Date: 05/24/2021 CLINICAL DATA:  Transaminitis EXAM: ULTRASOUND ABDOMEN LIMITED RIGHT UPPER QUADRANT COMPARISON:  Abdominal ultrasound dated December 23, 2019 FINDINGS: Gallbladder: No gallstones or wall thickening visualized. No sonographic Murphy sign noted by sonographer. Common bile duct: Diameter: 5 mm Liver: Hyperechoic hypoechoic mass of the right hepatic lobe measuring 2.5 x 2.3 x 3.8 cm. Heterogeneous echotexture with increased parenchymal echogenicity. Portal vein is patent on color Doppler imaging with normal direction of blood flow towards the liver. Other: None. IMPRESSION: 1. Indeterminate hyperechoic mass of the right hepatic lobe measuring 3.8 cm, possibly a hemangioma. Recommend contrast-enhanced liver MRI for further evaluation which can be performed non emergently. 2. Hepatic steatosis. Electronically Signed   By: Yetta Glassman M.D.   On: 05/24/2021 08:17        Scheduled Meds:  Chlorhexidine Gluconate Cloth  6 each Topical Q0600   cholecalciferol  1,000 Units Oral Daily   famotidine  40 mg Oral BID   feeding supplement  1 Container Oral TID BM   gabapentin  300 mg Oral TID   insulin aspart  0-5 Units Subcutaneous QHS   insulin aspart  0-9 Units Subcutaneous TID WC   insulin aspart  5 Units Subcutaneous TID WC   insulin glargine-yfgn  16 Units Subcutaneous QHS   mometasone-formoterol  2 puff Inhalation BID   montelukast  10 mg Oral QHS   multivitamin with minerals  1 tablet Oral Daily   pantoprazole  40 mg Oral Daily   sodium bicarbonate  650 mg Oral BID   Continuous Infusions:  sodium chloride 10 mL/hr at 05/22/21 1600   cefTRIAXone (ROCEPHIN)  IV 2 g (05/24/21 1524)   metronidazole 500 mg (05/24/21 1450)     LOS: 2 days    Time spent: 40 minutes    Irine Seal, MD [facility]   To contact the attending provider between 7A-7P or the covering provider during  after hours 7P-7A, please log into the web site [email or web address] and access using universal [place] password for that web site. If you do not have the password, please call the hospital operator.  05/24/2021, 3:46 PM

## 2021-05-25 ENCOUNTER — Other Ambulatory Visit: Payer: Self-pay

## 2021-05-25 DIAGNOSIS — I10 Essential (primary) hypertension: Secondary | ICD-10-CM

## 2021-05-25 DIAGNOSIS — N764 Abscess of vulva: Secondary | ICD-10-CM

## 2021-05-25 DIAGNOSIS — R000 Tachycardia, unspecified: Secondary | ICD-10-CM

## 2021-05-25 LAB — BASIC METABOLIC PANEL
Anion gap: 15 (ref 5–15)
BUN: 11 mg/dL (ref 6–20)
CO2: 14 mmol/L — ABNORMAL LOW (ref 22–32)
Calcium: 8.4 mg/dL — ABNORMAL LOW (ref 8.9–10.3)
Chloride: 107 mmol/L (ref 98–111)
Creatinine, Ser: 1.34 mg/dL — ABNORMAL HIGH (ref 0.44–1.00)
GFR, Estimated: 52 mL/min — ABNORMAL LOW (ref >60)
Glucose, Bld: 343 mg/dL — ABNORMAL HIGH (ref 70–99)
Potassium: 5 mmol/L (ref 3.5–5.1)
Sodium: 136 mmol/L (ref 135–145)

## 2021-05-25 LAB — CBC WITH DIFFERENTIAL/PLATELET
Abs Immature Granulocytes: 0.03 10*3/uL (ref 0.00–0.07)
Basophils Absolute: 0 10*3/uL (ref 0.0–0.1)
Basophils Relative: 1 %
Eosinophils Absolute: 0.2 10*3/uL (ref 0.0–0.5)
Eosinophils Relative: 5 %
HCT: 30.2 % — ABNORMAL LOW (ref 36.0–46.0)
Hemoglobin: 10.1 g/dL — ABNORMAL LOW (ref 12.0–15.0)
Immature Granulocytes: 1 %
Lymphocytes Relative: 8 %
Lymphs Abs: 0.4 10*3/uL — ABNORMAL LOW (ref 0.7–4.0)
MCH: 35.6 pg — ABNORMAL HIGH (ref 26.0–34.0)
MCHC: 33.4 g/dL (ref 30.0–36.0)
MCV: 106.3 fL — ABNORMAL HIGH (ref 80.0–100.0)
Monocytes Absolute: 0.4 10*3/uL (ref 0.1–1.0)
Monocytes Relative: 8 %
Neutro Abs: 3.7 10*3/uL (ref 1.7–7.7)
Neutrophils Relative %: 77 %
Platelets: 123 10*3/uL — ABNORMAL LOW (ref 150–400)
RBC: 2.84 MIL/uL — ABNORMAL LOW (ref 3.87–5.11)
RDW: 14.6 % (ref 11.5–15.5)
WBC: 4.7 10*3/uL (ref 4.0–10.5)
nRBC: 0 % (ref 0.0–0.2)

## 2021-05-25 LAB — GLUCOSE, CAPILLARY
Glucose-Capillary: 211 mg/dL — ABNORMAL HIGH (ref 70–99)
Glucose-Capillary: 344 mg/dL — ABNORMAL HIGH (ref 70–99)
Glucose-Capillary: 285 mg/dL — ABNORMAL HIGH (ref 70–99)
Glucose-Capillary: 152 mg/dL — ABNORMAL HIGH (ref 70–99)
Glucose-Capillary: 150 mg/dL — ABNORMAL HIGH (ref 70–99)

## 2021-05-25 LAB — MAGNESIUM: Magnesium: 1.2 mg/dL — ABNORMAL LOW (ref 1.7–2.4)

## 2021-05-25 MED ORDER — SODIUM BICARBONATE 650 MG PO TABS
650.0000 mg | ORAL_TABLET | Freq: Three times a day (TID) | ORAL | Status: DC
  Administered 2021-05-25 – 2021-05-28 (×11): 650 mg via ORAL
  Filled 2021-05-25 (×11): qty 1

## 2021-05-25 MED ORDER — INSULIN GLARGINE-YFGN 100 UNIT/ML ~~LOC~~ SOLN
18.0000 [IU] | Freq: Every day | SUBCUTANEOUS | Status: DC
  Administered 2021-05-25 – 2021-05-26 (×2): 18 [IU] via SUBCUTANEOUS
  Filled 2021-05-25 (×3): qty 0.18

## 2021-05-25 MED ORDER — INFLUENZA VAC SPLIT QUAD 0.5 ML IM SUSY: 0.5000 mL | PREFILLED_SYRINGE | INTRAMUSCULAR | Status: DC

## 2021-05-25 MED ORDER — MAGNESIUM SULFATE 4 GM/100ML IV SOLN
4.0000 g | Freq: Once | INTRAVENOUS | Status: AC
  Administered 2021-05-25: 4 g via INTRAVENOUS
  Filled 2021-05-25: qty 100

## 2021-05-25 MED ORDER — INSULIN ASPART 100 UNIT/ML IJ SOLN
7.0000 [IU] | Freq: Three times a day (TID) | INTRAMUSCULAR | Status: DC
  Administered 2021-05-25 – 2021-05-27 (×8): 7 [IU] via SUBCUTANEOUS

## 2021-05-25 MED ORDER — ALBUMIN HUMAN 25 % IV SOLN
25.0000 g | Freq: Four times a day (QID) | INTRAVENOUS | Status: AC
  Administered 2021-05-25 – 2021-05-26 (×3): 25 g via INTRAVENOUS
  Administered 2021-05-26: 12.5 g via INTRAVENOUS
  Filled 2021-05-25 (×4): qty 100

## 2021-05-25 NOTE — Progress Notes (Signed)
Patient triggered a yellow mews d/t heart rate and temp. No distress noted at this time. In bed resting. House coverage, Olena Heckle NP, notified. No new orders.

## 2021-05-25 NOTE — Progress Notes (Signed)
Gynecology Progress Note  Admission Date: 05/21/2021 Current Date: 05/25/2021 5:46 PM  Anna Garcia is a 41 y.o. G1P0010. HD#5 admitted for DKA with left labial abscess diagnosed.   History complicated by: Patient Active Problem List   Diagnosis Date Noted   Left genital labial abscess 05/25/2021   Labial swelling: Left 05/24/2021   Vulvar abscess 05/24/2021   Elevated brain natriuretic peptide (BNP) level 05/22/2021   Overweight (BMI 25.0-29.9) 05/22/2021   Hypokalemia 05/21/2021   Prolonged QT interval 05/21/2021   DKA, type 1 ([place]) 10/12/2020   Fibromyalgia 10/12/2020   CKD stage G3a/A3, GFR 45-59 and albumin creatinine ratio >300 mg/g ([place]) 10/12/2020   DKA (diabetic ketoacidosis) ([place]) 07/05/2020   SIRS (systemic inflammatory response syndrome) ([place]) 07/05/2020   Asthma 07/05/2020   AKI (acute kidney injury) ([place]) 07/05/2020   Transaminitis 07/05/2020   Hypertension    Gastroparesis    Uncontrolled type 1 diabetes mellitus with hyperglycemia, with long-term current use of insulin ([place]) 06/19/2006   NEUROPATHY, PERIPHERAL 06/19/2006   ACNE 06/19/2006    ROS and patient/family/surgical history, located on admission H&P note dated 05/21/2021, have been reviewed, and there are no changes except as noted below Yesterday/Overnight Events:  None significant from gyn perspective.  Subjective:  Pt had abscess with I and D on 05/24/21.  Pus and fluid was drained.  Cultures are still pending.  Pt notes decreased pressure and only mild discomfort with using restroom.  I exchanged packing during the exam.    Objective:   Vitals:   05/25/21 0804 05/25/21 0805 05/25/21 1000 05/25/21 1410  BP: (!) 91/55 (!) 92/58 91/62 100/71  Pulse: (!) 111 (!) 110 (!) 110 (!) 110  Resp: 17  17 17   Temp: 99.1 F (37.3 C)  98.6 F (37 C) 97.9 F (36.6 C)  TempSrc: Oral     SpO2: 97%  99% 99%  Weight:      Height:        Temp:  [97.9 F (36.6 C)-100.7 F (38.2 C)] 97.9 F (36.6 C) (02/03  1410) Pulse Rate:  [100-120] 110 (02/03 1410) Resp:  [17-18] 17 (02/03 1410) BP: (91-106)/(55-71) 100/71 (02/03 1410) SpO2:  [97 %-99 %] 99 % (02/03 1410) I/O last 3 completed shifts: In: 1022.1 [P.O.:720; IV Piggyback:302.1] Out: 0  Total I/O In: 720 [P.O.:720] Out: -   Intake/Output Summary (Last 24 hours) at 05/25/2021 1746 Last data filed at 05/25/2021 1410 Gross per 24 hour  Intake 1502.13 ml  Output --  Net 1502.13 ml     Current Vital Signs 24h Vital Sign Ranges  T 97.9 F (36.6 C) Temp  Avg: 98.9 F (37.2 C)  Min: 97.9 F (36.6 C)  Max: 100.7 F (38.2 C)  BP 100/71 BP  Min: 91/62  Max: 106/66  HR (!) 110  Pulse  Avg: 110.2  Min: 100  Max: 120  RR 17 Resp  Avg: 17.4  Min: 17  Max: 18  SaO2 99 % Room Air SpO2  Avg: 97.8 %  Min: 97 %  Max: 99 %       24 Hour I/O Current Shift I/O  Time Ins Outs 02/02 0701 - 02/03 0700 In: 782.1 [P.O.:480] Out: -  02/03 0701 - 02/03 1900 In: 720 [P.O.:720] Out: -    Patient Vitals for the past 12 hrs:  BP Temp Temp src Pulse Resp SpO2  05/25/21 1410 100/71 97.9 F (36.6 C) -- (!) 110 17 99 %  05/25/21 1000 91/62 98.6 F (  37 C) -- (!) 110 17 99 %  05/25/21 0805 (!) 92/58 -- -- (!) 110 -- --  05/25/21 0804 (!) 91/55 99.1 F (37.3 C) Oral (!) 111 17 97 %  05/25/21 0724 -- 99.1 F (37.3 C) Oral -- -- 97 %  05/25/21 0607 106/66 (!) 100.7 F (38.2 C) -- (!) 120 18 97 %     Patient Vitals for the past 24 hrs:  BP Temp Temp src Pulse Resp SpO2  05/25/21 1410 100/71 97.9 F (36.6 C) -- (!) 110 17 99 %  05/25/21 1000 91/62 98.6 F (37 C) -- (!) 110 17 99 %  05/25/21 0805 (!) 92/58 -- -- (!) 110 -- --  05/25/21 0804 (!) 91/55 99.1 F (37.3 C) Oral (!) 111 17 97 %  05/25/21 0724 -- 99.1 F (37.3 C) Oral -- -- 97 %  05/25/21 0607 106/66 (!) 100.7 F (38.2 C) -- (!) 120 18 97 %  05/24/21 2103 96/63 98.1 F (36.7 C) -- 100 18 98 %    Physical exam: General appearance: alert, cooperative, appears stated age, and no  distress GU: left labia with packing.  Packing exchanged.  Lesion is no longer fluctuant and no obvious drainage or bleeding noted. Psych: appropriate Neurologic: Grossly normal  Medications Current Facility-Administered Medications  Medication Dose Route Frequency Provider Last Rate Last Admin   0.9 %  sodium chloride infusion   Intravenous PRN Annita Brod, MD 10 mL/hr at 05/22/21 1600 Infusion Verify at 05/22/21 1600   acetaminophen (TYLENOL) tablet 650 mg  650 mg Oral Q6H PRN Annita Brod, MD   650 mg at 05/25/21 4196   Or   acetaminophen (TYLENOL) suppository 650 mg  650 mg Rectal Q6H PRN Annita Brod, MD       albumin human 25 % solution 25 g  25 g Intravenous Q6H Eugenie Filler, MD 60 mL/hr at 05/25/21 1646 25 g at 05/25/21 1646   cefTRIAXone (ROCEPHIN) 2 g in sodium chloride 0.9 % 100 mL IVPB  2 g Intravenous Q24H Eugenie Filler, MD 200 mL/hr at 05/25/21 1431 2 g at 05/25/21 1431   cholecalciferol (VITAMIN D3) tablet 1,000 Units  1,000 Units Oral Daily Eugenie Filler, MD   1,000 Units at 05/25/21 0929   dextrose 50 % solution 0-50 mL  0-50 mL Intravenous PRN Annita Brod, MD   25 mL at 05/22/21 0626   doxycycline (VIBRA-TABS) tablet 100 mg  100 mg Oral Q12H Eugenie Filler, MD   100 mg at 05/25/21 0929   famotidine (PEPCID) tablet 40 mg  40 mg Oral BID Eugenie Filler, MD   40 mg at 05/25/21 2229   feeding supplement (BOOST / RESOURCE BREEZE) liquid 1 Container  1 Container Oral TID BM Annita Brod, MD   1 Container at 05/22/21 1526   gabapentin (NEURONTIN) capsule 300 mg  300 mg Oral TID Eugenie Filler, MD   300 mg at 05/25/21 1658   insulin aspart (novoLOG) injection 0-5 Units  0-5 Units Subcutaneous QHS Annita Brod, MD   2 Units at 05/24/21 2146   insulin aspart (novoLOG) injection 0-9 Units  0-9 Units Subcutaneous TID WC Annita Brod, MD   5 Units at 05/25/21 1413   insulin aspart (novoLOG) injection 7 Units  7 Units  Subcutaneous TID WC Eugenie Filler, MD   7 Units at 05/25/21 1414   insulin glargine-yfgn (SEMGLEE) injection 18 Units  18 Units Subcutaneous  QHS Eugenie Filler, MD       levalbuterol [facility] HFA) inhaler 1 puff  1 puff Inhalation Q6H PRN Eugenie Filler, MD       metroNIDAZOLE (FLAGYL) IVPB 500 mg  500 mg Intravenous Q12H Eugenie Filler, MD 100 mL/hr at 05/25/21 1422 500 mg at 05/25/21 1422   mometasone-formoterol (DULERA) 100-5 MCG/ACT inhaler 2 puff  2 puff Inhalation BID Eugenie Filler, MD   2 puff at 05/25/21 0724   montelukast (SINGULAIR) tablet 10 mg  10 mg Oral QHS Eugenie Filler, MD   10 mg at 05/24/21 2138   multivitamin with minerals tablet 1 tablet  1 tablet Oral Daily Annita Brod, MD   1 tablet at 05/25/21 0929   oxyCODONE-acetaminophen (PERCOCET/ROXICET) 5-325 MG per tablet 1 tablet  1 tablet Oral Q6H PRN Eugenie Filler, MD   1 tablet at 05/25/21 1658   pantoprazole (PROTONIX) EC tablet 40 mg  40 mg Oral Daily Randa Spike, RPH   40 mg at 05/25/21 6789   prochlorperazine (COMPAZINE) injection 5 mg  5 mg Intravenous Q4H PRN Annita Brod, MD   5 mg at 05/21/21 1642   sodium bicarbonate tablet 650 mg  650 mg Oral TID Eugenie Filler, MD   650 mg at 05/25/21 [place]  Lab 05/23/21 0421 05/24/21 0427 05/25/21 0425  WBC 5.4 6.4 4.7  HGB 9.8* 9.6* 10.1*  HCT 29.3* 29.1* 30.2*  PLT 131* 137* 123*    Recent Labs  Lab 05/22/21 0517 05/22/21 0934 05/23/21 0421 05/24/21 0427 05/25/21 0425  NA 137   < > 138 135 136  K 2.7*   < > 3.2* 2.9* 5.0  CL 110   < > 114* 110 107  CO2 18*   < > 16* 18* 14*  BUN 17   < > 10 10 11   CREATININE 2.06*   < > 1.71* 1.58* 1.34*  CALCIUM 9.1   < > 8.3* 8.0* 8.4*  PROT 6.1*  --  5.7* 5.8*  --   BILITOT 0.9  --  0.6 0.7  --   ALKPHOS 213*  --  200* 194*  --   ALT 90*  --  103* 89*  --   AST 165*  --  226* 129*  --   GLUCOSE 102*   < > 231* 310* 343*   < > = values in this  interval not displayed.     Assessment & Plan:  Labial abscess  Plan:  abscess drained , cultures are still pending.  Recommend primary MD adjust antibiotic coverage depending on culture and sensitivity. Wound is healing well with no apparent extension of infection. Twice daily packing of wound until the lesion is too shallow to pack. Will sign off on consult at this time.   Gyn team can be reconsulted if needed.  We will be happy to see pt in outpatient setting in 2-3 weeks to make sure the labia is well healed. Code Status: Full Code  Total time taking care of the patient was 15 minutes, with greater than 50% of the time spent in face to face interaction with the patient.  Lynnda Shields, MD Attending Center for [facility])

## 2021-05-25 NOTE — Progress Notes (Addendum)
PROGRESS NOTE    Anna Garcia  LZJ:[id number] DOB: 02-10-1981 DOA: 05/21/2021 PCP: Day, [name], MD    Chief Complaint  Patient presents with   Palpitations   Shortness of Breath    Brief Narrative:  41 year old female with past medical history for type 1 diabetes poorly controlled with secondary gastroparesis and peripheral neuropathy as well as hypertension who presented to the emergency room with palpitations, chest tightness and shortness of breath.  Patient found to be in DKA and admitted to the hospitalist service, being started on an insulin drip plus IV fluids.   By 1/31, anion gap corrected and DKA resolved.       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 -Anion gap closed and patient transitioned to subcutaneous insulin with sliding scale insulin. -Hemoglobin A1c 9.6 (05/21/2021) -CBG 344 this morning.   -Increase Semglee to 18 units daily. -Increase NovoLog meal coverage to 7 units 3 times daily.   -Continue Neurontin 300 mg 3 times daily to aid with neuropathy.   -Diabetes coordinator following.  -Outpatient follow-up with endocrinology post discharge.  2.  CKD stage IIIb -Stable.  3.  Metabolic acidosis -Patient with CKD stage IIIb, was in DKA and noted to be acidotic with some improvement  with acidosis, anion gap closed, patient transitioned to subcutaneous insulin. -Acidosis fluctuating.   -Fluctuation of acidosis could likely be secondary to acute valvular abscess.  -Increase bicarb tablets to 3 times daily.   -Continue IV antibiotics.   -Follow.  4.  Gastroparesis -Patient states history of gastroparesis, has tried Reglan in the past with no significant improvement. -Patient started on Neurontin per Dr. [place] Pink. -Follow-up.  5.  Hypertension -Blood pressure noted to be soft felt likely secondary to volume depletion. -Status post IV fluid bolus. -IV albumin every 6 hours x1 day. -Follow.  6.  Valvular abscess, left, POA -Patient with complaints of left labial swelling. -Patient examined with RN Alphonzo Lemmings as chaperone and area of induration with some redness and exquisite tenderness to palpation noted (05/24/2021). -Case discussed with GYN who recommended pelvic CT for further evaluation which was unremarkable.   -Patient seen in consultation by GYN, Dr. Elgie Congo who was concerned over valvular abscess since patient subsequently underwent incision and drainage of valvular abscess with purulent drainage noted which was sent for cultures.   -Continue twice daily packing of wound per GYN recommendations.   -Continue IV Rocephin, IV Flagyl, doxycycline, warm compresses.   -We will need outpatient follow-up with GYN 2 to 3 weeks postdischarge.   7.  Hypokalemia/hypomagnesemia -K-Dur 40 mEq p.o. every 4 hours x2 doses. -Magnesium at 1.2. -Magnesium sulfate 4 g IV x1. -Repeat labs in AM.  8.  Elevated BNP, POA -Patient with no signs or symptoms of volume overload on examination. -2D  echo with normal EF, no wall motion abnormalities. -Urine output not properly recorded.  9.  Transaminitis -??  Etiology. -LFTs trending down. -Acute hepatitis panel negative.   -Right upper quadrant ultrasound concerning for hepatic steatosis, hyperechoic mass in the right hepatic lobe  possibly hemangioma. -Outpatient follow-up.  10.  Overweight -Lifestyle modification.  11.  Sinus tachycardia -Likely secondary to electrolyte abnormalities, borderline/low blood pressure, valvular abscess. -Replete electrolytes. -Check a TSH. -Place on IV albumin. -Continue empiric IV antibiotics.   DVT prophylaxis: SCDs Code Status: Full Family Communication: Updated patient.  No family at bedside. Disposition:   Status is: Inpatient Remains inpatient appropriate because: Severity of illness.  Planned Discharge Destination: Home         Consultants:  GYN: Dr. Elgie Congo 05/24/2021  Procedures:  Chest x-ray 05/21/2021 2D echo 05/22/2021 CT pelvis 05/24/2021 Right upper quadrant ultrasound 05/24/2021 Incision and drainage of vulvar abscess per GYN: Dr. Elgie Congo 05/24/2021   Antimicrobials:  IV Rocephin 05/24/2021>>> IV Flagyl 05/24/2021>>>> Doxycycline 05/24/2021>>>>>   Subjective: Patient laying in bed.  No chest pain.  No shortness of breath.  No abdominal pain.  Feels vaginal swelling and pain improved after drainage.  GYN on 05/24/2021.  Complaining of heart pounding/palpitations on ambulation that has been ongoing for approximately a year.   Objective: Vitals:   05/25/21 0724 05/25/21 0804 05/25/21 0805 05/25/21 1000  BP:  (!) 91/55 (!) 92/58 91/62  Pulse:  (!) 111 (!) 110 (!) 110  Resp:  17  17  Temp: 99.1 F (37.3 C) 99.1 F (37.3 C)  98.6 F (37 C)  TempSrc: Oral Oral    SpO2: 97% 97%  99%  Weight:      Height:        Intake/Output Summary (Last 24 hours) at 05/25/2021 1346 Last data filed at 05/25/2021 1000 Gross per 24 hour  Intake 1142.13 ml  Output --  Net 1142.13 ml    Filed Weights   05/21/21 1350 05/21/21 1617  Weight: 77.1 kg 74.2 kg    Examination:  General exam: NAD. Respiratory system: CTA B.  No wheezes, no crackles, no rhonchi.  Normal respiratory effort.  Speaking in full sentences.  Cardiovascular system: RRR no murmurs rubs or gallops.  No  JVD.  No lower extremity edema.  Gastrointestinal system: Abdomen is soft, nontender, nondistended, positive bowel sounds.  No rebound.  No guarding.  Central nervous system: Alert and oriented. No focal neurological deficits. Genitourinary: Deferred.  Extremities: Symmetric 5 x 5 power. Skin: No rashes, lesions or ulcers Psychiatry: Judgement and insight appear normal. Mood & affect appropriate.     Data Reviewed: I have personally reviewed following labs and imaging studies  CBC: Recent Labs  Lab 05/21/21 1110 05/22/21 0116 05/23/21 0421 05/24/21 0427 05/25/21 0425  WBC 10.2 6.8 5.4 6.4 4.7  NEUTROABS 6.0  --   --  3.2 3.7  HGB 12.8 10.8* 9.8* 9.6* 10.1*  HCT 39.3 31.9* 29.3* 29.1* 30.2*  MCV 108.0* 104.6* 106.9* 109.0* 106.3*  PLT 251 160 131* 137* 123*     Basic Metabolic Panel: Recent Labs  Lab 05/21/21 1345 05/21/21 1901 05/22/21 0116 05/22/21 0517 05/22/21 0934 05/23/21 0421 05/24/21 0427 05/25/21 0425  NA 132*   < > 135 137 137 138 135 136  K 2.7*   < > 2.8* 2.7* 5.7* 3.2* 2.9* 5.0  CL 101   < > 107 110 114* 114* 110 107  CO2 12*   < > 17* 18* 14* 16* 18* 14*  GLUCOSE 293*   < > 166* 102* 230* 231* 310* 343*  BUN 22*   < > 18 17 15 10 10 11   CREATININE 2.83*   < > 2.14* 2.06* 1.93* 1.71* 1.58* 1.34*  CALCIUM 9.3   < > 9.1 9.1 8.8* 8.3* 8.0* 8.4*  MG 1.8  --  1.6*  --  2.2 2.0  --  1.2*   < > = values in this interval not displayed.     GFR: Estimated Creatinine Clearance: 55.6 mL/min (A) (by C-G formula based on SCr of 1.34 mg/dL (H)).  Liver Function Tests: Recent Labs  Lab 05/21/21 1110 05/22/21 0517 05/23/21 0421 05/24/21 0427  AST 192* 165* 226* 129*  ALT 122* 90* 103* 89*  ALKPHOS 299* 213* 200* 194*  BILITOT 1.7* 0.9 0.6 0.7  PROT 8.0 6.1* 5.7* 5.8*  ALBUMIN 3.8 2.8* 2.6* 2.7*     CBG: Recent Labs  Lab 05/24/21 2143 05/24/21 2333 05/25/21 0245 05/25/21 0728 05/25/21 1333  GLUCAP 203* 176* 211* 344* 285*      Recent  Results (from the past 240 hour(s))  Resp Panel by RT-PCR (Flu A&B, Covid) Nasopharyngeal Swab     Status: None   Collection Time: 05/21/21 11:41 AM   Specimen: Nasopharyngeal Swab; Nasopharyngeal(NP) swabs in vial transport medium  Result Value Ref Range Status   SARS Coronavirus 2 by RT PCR NEGATIVE NEGATIVE Final    Comment: (NOTE) SARS-CoV-2 target nucleic acids are NOT DETECTED.  The SARS-CoV-2 RNA is generally detectable in upper respiratory specimens during the acute phase of infection. The lowest concentration of SARS-CoV-2 viral copies this assay can detect is 138 copies/mL. A negative result does not preclude SARS-Cov-2 infection and should not be used as the sole basis for treatment or other patient management decisions. A negative result may occur with  improper specimen collection/handling, submission of specimen other than nasopharyngeal swab, presence of viral mutation(s) within the areas targeted by this assay, and inadequate number of viral copies(<138 copies/mL). A negative result must be combined with clinical observations, patient history, and epidemiological information. The expected result is Negative.  Fact Sheet for Patients:  EntrepreneurPulse.com.au  Fact Sheet for Healthcare Providers:  IncredibleEmployment.be  This test is no t yet approved or cleared by the [place] FDA and  has been authorized for detection and/or diagnosis of SARS-CoV-2 by FDA under an Emergency Use Authorization (EUA). This EUA will remain  in effect (meaning this test can be used) for the duration of the COVID-19 declaration under Section 564(b)(1) of the Act, 21 U.S.C.section 360bbb-3(b)(1), unless the authorization is terminated  or revoked sooner.       Influenza A by PCR NEGATIVE NEGATIVE Final   Influenza B by PCR NEGATIVE NEGATIVE Final    Comment: (NOTE) The Xpert Xpress SARS-CoV-2/FLU/RSV plus assay is intended as an aid in the  diagnosis of influenza from Nasopharyngeal swab specimens and should not be used as a sole basis for treatment. Nasal washings and aspirates are unacceptable for Xpert Xpress SARS-CoV-2/FLU/RSV testing.  Fact Sheet for Patients: EntrepreneurPulse.com.au  Fact Sheet for Healthcare Providers: IncredibleEmployment.be  This test is not yet approved or cleared by the [place] FDA and has been authorized for detection and/or diagnosis of SARS-CoV-2 by FDA under an Emergency Use Authorization (EUA). This EUA will remain in effect (meaning this test can be used) for the duration of the COVID-19 declaration under Section 564(b)(1) of the Act, 21 U.S.C. section 360bbb-3(b)(1), unless the authorization is terminated or revoked.  Performed at [facility], [place] [address] [place], [place] [address]   MRSA Next Gen by PCR, Nasal     Status: None   Collection Time: 05/21/21  5:08 PM   Specimen: Nasal Mucosa; Nasal Swab  Result Value Ref Range Status   MRSA by PCR Next Gen NOT DETECTED NOT DETECTED Final    Comment: (NOTE) The GeneXpert MRSA Assay (FDA approved for NASAL specimens only), is one component of a comprehensive MRSA colonization surveillance program. It is not intended to diagnose MRSA infection nor to guide or monitor treatment for MRSA infections. Test performance is not FDA approved in patients less than 3 years old. Performed at [facility], [place] [address], [place], [place] [address]   Aerobic Culture w Gram Stain (superficial specimen)     Status: None (Preliminary result)   Collection Time: 05/24/21  6:22 PM   Specimen: Labia  Result Value Ref Range Status   Specimen Description   Final    LABIA Performed at [place] [address], [place], [place] [address]    Special Requests   Final    NONE Performed at [facility], [place] [address]  [place], [place] [address]    Gram Stain   Final    FEW WBC PRESENT, PREDOMINANTLY MONONUCLEAR FEW GRAM POSITIVE RODS RARE GRAM POSITIVE COCCI RARE GRAM NEGATIVE RODS    Culture   Final    NO GROWTH < 12 HOURS Performed at [facility], [place] [address] [place], [place] [address]    Report Status PENDING  Incomplete          Radiology Studies: CT PELVIS WO CONTRAST  Result Date: 05/24/2021 CLINICAL DATA:  Soft tissue infection/abscess suspected. EXAM: CT PELVIS WITHOUT CONTRAST TECHNIQUE: Multidetector CT imaging of the pelvis was performed following the standard protocol without intravenous contrast. RADIATION DOSE REDUCTION: This exam was performed according to the departmental dose-optimization program which includes automated exposure control, adjustment of the mA and/or kV according to patient size and/or use of iterative reconstruction technique. COMPARISON:  None. FINDINGS: Urinary Tract:  Urinary bladder appears within normal limits. Bowel: No evidence of bowel obstruction. No bowel wall edema visualized. Appendix is normal. Vascular/Lymphatic: No pathologically enlarged lymph nodes. No significant vascular abnormality seen. Reproductive: IUD in the uterus. No suspicious adnexal mass identified. Other: Small to moderate amount of free fluid in the pelvis. No focal fluid collections identified in the soft tissues. Musculoskeletal: No acute osseous abnormality identified. No suspicious bony lesions. IMPRESSION: 1. No acute process or focal collection identified to suggest abscess, given limitations of noncontrast study. 2. Free fluid in the pelvis which is nonspecific, most likely physiologic. Electronically Signed   By: Ofilia Neas M.D.   On: 05/24/2021 14:52   US Abdomen Limited RUQ (LIVER/GB)  Result Date: 05/24/2021 CLINICAL DATA:  Transaminitis EXAM: ULTRASOUND ABDOMEN LIMITED RIGHT UPPER QUADRANT COMPARISON:  Abdominal ultrasound dated December 23, 2019 FINDINGS:  Gallbladder: No gallstones or wall thickening visualized. No sonographic Murphy sign noted by sonographer. Common bile duct: Diameter: 5 mm Liver: Hyperechoic hypoechoic mass of the right hepatic lobe measuring 2.5 x 2.3 x 3.8 cm. Heterogeneous echotexture with increased parenchymal echogenicity. Portal vein is patent on color Doppler imaging with normal direction of blood flow towards the liver. Other: None. IMPRESSION: 1. Indeterminate hyperechoic mass of the right hepatic lobe measuring 3.8 cm, possibly a hemangioma. Recommend contrast-enhanced liver MRI for further evaluation which can be performed non  emergently. 2. Hepatic steatosis. Electronically Signed   By: Yetta Glassman M.D.   On: 05/24/2021 08:17        Scheduled Meds:  cholecalciferol  1,000 Units Oral Daily   doxycycline  100 mg Oral Q12H   famotidine  40 mg Oral BID   feeding supplement  1 Container Oral TID BM   gabapentin  300 mg Oral TID   insulin aspart  0-5 Units Subcutaneous QHS   insulin aspart  0-9 Units Subcutaneous TID WC   insulin aspart  7 Units Subcutaneous TID WC   insulin glargine-yfgn  18 Units Subcutaneous QHS   mometasone-formoterol  2 puff Inhalation BID   montelukast  10 mg Oral QHS   multivitamin with minerals  1 tablet Oral Daily   pantoprazole  40 mg Oral Daily   sodium bicarbonate  650 mg Oral TID   Continuous Infusions:  sodium chloride 10 mL/hr at 05/22/21 1600   albumin human     cefTRIAXone (ROCEPHIN)  IV 2 g (05/24/21 1524)   metronidazole 500 mg (05/25/21 0323)     LOS: 3 days    Time spent: 40 minutes    Irine Seal, MD [facility]   To contact the attending provider between 7A-7P or the covering provider during after hours 7P-7A, please log into the web site [email or web address] and access using universal [place] password for that web site. If you do not have the password, please call the hospital operator.  05/25/2021, 1:46 PM

## 2021-05-26 LAB — CBC WITH DIFFERENTIAL/PLATELET
Abs Immature Granulocytes: 0.01 10*3/uL (ref 0.00–0.07)
Basophils Absolute: 0 10*3/uL (ref 0.0–0.1)
Basophils Relative: 1 %
Eosinophils Absolute: 0.1 10*3/uL (ref 0.0–0.5)
Eosinophils Relative: 4 %
HCT: 25.2 % — ABNORMAL LOW (ref 36.0–46.0)
Hemoglobin: 8.1 g/dL — ABNORMAL LOW (ref 12.0–15.0)
Immature Granulocytes: 0 %
Lymphocytes Relative: 30 %
Lymphs Abs: 1 10*3/uL (ref 0.7–4.0)
MCH: 34.9 pg — ABNORMAL HIGH (ref 26.0–34.0)
MCHC: 32.1 g/dL (ref 30.0–36.0)
MCV: 108.6 fL — ABNORMAL HIGH (ref 80.0–100.0)
Monocytes Absolute: 0.5 10*3/uL (ref 0.1–1.0)
Monocytes Relative: 15 %
Neutro Abs: 1.6 10*3/uL — ABNORMAL LOW (ref 1.7–7.7)
Neutrophils Relative %: 50 %
Platelets: 126 10*3/uL — ABNORMAL LOW (ref 150–400)
RBC: 2.32 MIL/uL — ABNORMAL LOW (ref 3.87–5.11)
RDW: 15.3 % (ref 11.5–15.5)
WBC: 3.3 10*3/uL — ABNORMAL LOW (ref 4.0–10.5)
nRBC: 0 % (ref 0.0–0.2)

## 2021-05-26 LAB — GLUCOSE, CAPILLARY
Glucose-Capillary: 215 mg/dL — ABNORMAL HIGH (ref 70–99)
Glucose-Capillary: 381 mg/dL — ABNORMAL HIGH (ref 70–99)
Glucose-Capillary: 170 mg/dL — ABNORMAL HIGH (ref 70–99)
Glucose-Capillary: 181 mg/dL — ABNORMAL HIGH (ref 70–99)

## 2021-05-26 LAB — TSH: TSH: 3.922 u[IU]/mL (ref 0.350–4.500)

## 2021-05-26 LAB — COMPREHENSIVE METABOLIC PANEL
ALT: 71 U/L — ABNORMAL HIGH (ref 0–44)
AST: 122 U/L — ABNORMAL HIGH (ref 15–41)
Albumin: 3.2 g/dL — ABNORMAL LOW (ref 3.5–5.0)
Alkaline Phosphatase: 174 U/L — ABNORMAL HIGH (ref 38–126)
Anion gap: 10 (ref 5–15)
BUN: 10 mg/dL (ref 6–20)
CO2: 20 mmol/L — ABNORMAL LOW (ref 22–32)
Calcium: 7.8 mg/dL — ABNORMAL LOW (ref 8.9–10.3)
Chloride: 103 mmol/L (ref 98–111)
Creatinine, Ser: 1.57 mg/dL — ABNORMAL HIGH (ref 0.44–1.00)
GFR, Estimated: 43 mL/min — ABNORMAL LOW (ref >60)
Glucose, Bld: 226 mg/dL — ABNORMAL HIGH (ref 70–99)
Potassium: 3.2 mmol/L — ABNORMAL LOW (ref 3.5–5.1)
Sodium: 133 mmol/L — ABNORMAL LOW (ref 135–145)
Total Bilirubin: 0.3 mg/dL (ref 0.3–1.2)
Total Protein: 5.8 g/dL — ABNORMAL LOW (ref 6.5–8.1)

## 2021-05-26 LAB — CORTISOL: Cortisol, Plasma: 9.8 ug/dL

## 2021-05-26 LAB — MAGNESIUM: Magnesium: 2.2 mg/dL (ref 1.7–2.4)

## 2021-05-26 MED ORDER — SODIUM CHLORIDE 0.9 % IV BOLUS
500.0000 mL | Freq: Once | INTRAVENOUS | Status: AC
  Administered 2021-05-26: 500 mL via INTRAVENOUS

## 2021-05-26 MED ORDER — SODIUM CHLORIDE 0.9 % IV BOLUS
1000.0000 mL | Freq: Once | INTRAVENOUS | Status: AC
  Administered 2021-05-26: 1000 mL via INTRAVENOUS

## 2021-05-26 MED ORDER — POTASSIUM CHLORIDE CRYS ER 20 MEQ PO TBCR
40.0000 meq | EXTENDED_RELEASE_TABLET | Freq: Once | ORAL | Status: AC
  Administered 2021-05-26: 40 meq via ORAL
  Filled 2021-05-26: qty 2

## 2021-05-26 NOTE — Progress Notes (Signed)
Patient noted with a blood pressure of 82/61. No distress noted. In bed resting at this time. No c/o dizziness or lightheadedness. Skin remains warm and dry. Breathing is at a normal rate and rhythm. House coverage [place] MD contacted. New orders include 500 mL bolus.

## 2021-05-26 NOTE — Progress Notes (Incomplete)
PROGRESS NOTE    Anna Garcia  PPI:[id number] DOB: Aug 26, 1980 DOA: 05/21/2021 PCP: Day, [name], MD    Chief Complaint  Patient presents with   Palpitations   Shortness of Breath    Brief Narrative:  41 year old female with past medical history for type 1 diabetes poorly controlled with secondary gastroparesis and peripheral neuropathy as well as hypertension who presented to the emergency room with palpitations, chest tightness and shortness of breath.  Patient found to be in DKA and admitted to the hospitalist service, being started on an insulin drip plus IV fluids.  By 1/31, anion gap corrected and DKA resolved.      Assessment & Plan:   Assessment and Plan: * DKA (diabetic ketoacidosis) ([place])- (present on admission) Started on insulin drip and IV fluids.  Early morning of 1/31, anion gap corrected and DKA resolved.  We will switch over to insulin and sliding scale.  Overweight (BMI 25.0-29.9)- (present on admission) Meets criteria for BMI greater than 25  Elevated brain natriuretic peptide (BNP) level- (present on admission) No previously reported history of CHF.  Echocardiogram done 1/31 unremarkable with normal diastolic and systolic function.  Hypokalemia- (present on admission) Replace.  Prolonged hypokalemia due to insulin shifts  Gastroparesis- (present on admission) We will start Neurontin  Hypertension- (present on admission) Currently mild hypotension due to volume depletion from DKA  Transaminitis- (present on admission) Secondary to CHF?  We will follow.  AKI (acute kidney injury) ([place])- (present on admission) Secondary to DKA.  IV fluids.  Still not at baseline.  Uncontrolled type 1 diabetes mellitus with hyperglycemia, with long-term current use of insulin ([place])- (present on admission) A1c slightly worse than previous at 9.6.  Needs education.  She reports previously being on Lantus twice a day and then making adjustments based off of episodes of  nighttime hypoglycemia.  Diabetes coordinator will help with education.         DVT prophylaxis:  Code Status:  Family Communication:  Disposition:   Status is: Inpatient {Inpatient:23812}           Consultants:  ***  Procedures:  ***  Antimicrobials:  ***    Subjective: ***  Objective: Vitals:   05/26/21 0532 05/26/21 0752 05/26/21 1014 05/26/21 1258  BP: 94/67  96/63 94/69  Pulse: 87  (!) 109 96  Resp: 16  18 18   Temp: 98.1 F (36.7 C)  98.9 F (37.2 C) 98.6 F (37 C)  TempSrc: Oral  Oral Oral  SpO2: 97% 97% 97% 100%  Weight:      Height:        Intake/Output Summary (Last 24 hours) at 05/26/2021 1713 Last data filed at 05/26/2021 1400 Gross per 24 hour  Intake 2451.09 ml  Output --  Net 2451.09 ml   Filed Weights   05/21/21 1350 05/21/21 1617  Weight: 77.1 kg 74.2 kg    Examination:  General exam: Appears calm and comfortable  Respiratory system: Clear to auscultation. Respiratory effort normal. Cardiovascular system: S1 & S2 heard, RRR. No JVD, murmurs, rubs, gallops or clicks. No pedal edema. Gastrointestinal system: Abdomen is nondistended, soft and nontender. No organomegaly or masses felt. Normal bowel sounds heard. Central nervous system: Alert and oriented. No focal neurological deficits. Extremities: Symmetric 5 x 5 power. Skin: No rashes, lesions or ulcers Psychiatry: Judgement and insight appear normal. Mood & affect appropriate.     Data Reviewed:   CBC: Recent Labs  Lab 05/21/21 1110 05/22/21 0116 05/23/21 0421 05/24/21 0630  05/25/21 0425 05/26/21 0534  WBC 10.2 6.8 5.4 6.4 4.7 3.3*  NEUTROABS 6.0  --   --  3.2 3.7 1.6*  HGB 12.8 10.8* 9.8* 9.6* 10.1* 8.1*  HCT 39.3 31.9* 29.3* 29.1* 30.2* 25.2*  MCV 108.0* 104.6* 106.9* 109.0* 106.3* 108.6*  PLT 251 160 131* 137* 123* 126*    Basic Metabolic Panel: Recent Labs  Lab 05/22/21 0116 05/22/21 0517 05/22/21 0934 05/23/21 0421 05/24/21 0427 05/25/21 0425  05/26/21 0534  NA 135   < > 137 138 135 136 133*  K 2.8*   < > 5.7* 3.2* 2.9* 5.0 3.2*  CL 107   < > 114* 114* 110 107 103  CO2 17*   < > 14* 16* 18* 14* 20*  GLUCOSE 166*   < > 230* 231* 310* 343* 226*  BUN 18   < > 15 10 10 11 10   CREATININE 2.14*   < > 1.93* 1.71* 1.58* 1.34* 1.57*  CALCIUM 9.1   < > 8.8* 8.3* 8.0* 8.4* 7.8*  MG 1.6*  --  2.2 2.0  --  1.2* 2.2   < > = values in this interval not displayed.    GFR: Estimated Creatinine Clearance: 47.5 mL/min (A) (by C-G formula based on SCr of 1.57 mg/dL (H)).  Liver Function Tests: Recent Labs  Lab 05/21/21 1110 05/22/21 0517 05/23/21 0421 05/24/21 0427 05/26/21 0534  AST 192* 165* 226* 129* 122*  ALT 122* 90* 103* 89* 71*  ALKPHOS 299* 213* 200* 194* 174*  BILITOT 1.7* 0.9 0.6 0.7 0.3  PROT 8.0 6.1* 5.7* 5.8* 5.8*  ALBUMIN 3.8 2.8* 2.6* 2.7* 3.2*    CBG: Recent Labs  Lab 05/25/21 1757 05/25/21 2238 05/26/21 0720 05/26/21 1124 05/26/21 1551  GLUCAP 152* 150* 215* 381* 170*     Recent Results (from the past 240 hour(s))  Resp Panel by RT-PCR (Flu A&B, Covid) Nasopharyngeal Swab     Status: None   Collection Time: 05/21/21 11:41 AM   Specimen: Nasopharyngeal Swab; Nasopharyngeal(NP) swabs in vial transport medium  Result Value Ref Range Status   SARS Coronavirus 2 by RT PCR NEGATIVE NEGATIVE Final    Comment: (NOTE) SARS-CoV-2 target nucleic acids are NOT DETECTED.  The SARS-CoV-2 RNA is generally detectable in upper respiratory specimens during the acute phase of infection. The lowest concentration of SARS-CoV-2 viral copies this assay can detect is 138 copies/mL. A negative result does not preclude SARS-Cov-2 infection and should not be used as the sole basis for treatment or other patient management decisions. A negative result may occur with  improper specimen collection/handling, submission of specimen other than nasopharyngeal swab, presence of viral mutation(s) within the areas targeted by this  assay, and inadequate number of viral copies(<138 copies/mL). A negative result must be combined with clinical observations, patient history, and epidemiological information. The expected result is Negative.  Fact Sheet for Patients:  EntrepreneurPulse.com.au  Fact Sheet for Healthcare Providers:  IncredibleEmployment.be  This test is no t yet approved or cleared by the [place] FDA and  has been authorized for detection and/or diagnosis of SARS-CoV-2 by FDA under an Emergency Use Authorization (EUA). This EUA will remain  in effect (meaning this test can be used) for the duration of the COVID-19 declaration under Section 564(b)(1) of the Act, 21 U.S.C.section 360bbb-3(b)(1), unless the authorization is terminated  or revoked sooner.       Influenza A by PCR NEGATIVE NEGATIVE Final   Influenza B by PCR NEGATIVE NEGATIVE Final  Comment: (NOTE) The Xpert Xpress SARS-CoV-2/FLU/RSV plus assay is intended as an aid in the diagnosis of influenza from Nasopharyngeal swab specimens and should not be used as a sole basis for treatment. Nasal washings and aspirates are unacceptable for Xpert Xpress SARS-CoV-2/FLU/RSV testing.  Fact Sheet for Patients: EntrepreneurPulse.com.au  Fact Sheet for Healthcare Providers: IncredibleEmployment.be  This test is not yet approved or cleared by the [place] FDA and has been authorized for detection and/or diagnosis of SARS-CoV-2 by FDA under an Emergency Use Authorization (EUA). This EUA will remain in effect (meaning this test can be used) for the duration of the COVID-19 declaration under Section 564(b)(1) of the Act, 21 U.S.C. section 360bbb-3(b)(1), unless the authorization is terminated or revoked.  Performed at [facility], [place] [address], [place], [place] [address]   MRSA Next Gen by PCR, Nasal     Status: None   Collection Time:  05/21/21  5:08 PM   Specimen: Nasal Mucosa; Nasal Swab  Result Value Ref Range Status   MRSA by PCR Next Gen NOT DETECTED NOT DETECTED Final    Comment: (NOTE) The GeneXpert MRSA Assay (FDA approved for NASAL specimens only), is one component of a comprehensive MRSA colonization surveillance program. It is not intended to diagnose MRSA infection nor to guide or monitor treatment for MRSA infections. Test performance is not FDA approved in patients less than 19 years old. Performed at [facility], [place] [address] [place], [place] [address]   Aerobic Culture w Gram Stain (superficial specimen)     Status: None (Preliminary result)   Collection Time: 05/24/21  6:22 PM   Specimen: Labia  Result Value Ref Range Status   Specimen Description   Final    LABIA Performed at [place] [address] [place], [place] [address]    Special Requests   Final    NONE Performed at [facility], [place] [address], [place], [place] [address]    Gram Stain   Final    FEW WBC PRESENT, PREDOMINANTLY MONONUCLEAR FEW GRAM POSITIVE RODS RARE GRAM POSITIVE COCCI RARE GRAM NEGATIVE RODS    Culture   Final    CULTURE REINCUBATED FOR BETTER GROWTH Performed at [facility], [place] [address], [place], [place] [address]    Report Status PENDING  Incomplete         Radiology Studies: No results found.      Scheduled Meds:  cholecalciferol  1,000 Units Oral Daily   doxycycline  100 mg Oral Q12H   famotidine  40 mg Oral BID   feeding supplement  1 Container Oral TID BM   gabapentin  300 mg Oral TID   insulin aspart  0-5 Units Subcutaneous QHS   insulin aspart  0-9 Units Subcutaneous TID WC   insulin aspart  7 Units Subcutaneous TID WC   insulin glargine-yfgn  18 Units Subcutaneous QHS   mometasone-formoterol  2 puff Inhalation BID   montelukast  10 mg Oral QHS   multivitamin with minerals  1 tablet Oral Daily   pantoprazole   40 mg Oral Daily   sodium bicarbonate  650 mg Oral TID   Continuous Infusions:  sodium chloride 10 mL/hr at 05/22/21 1600   cefTRIAXone (ROCEPHIN)  IV 2 g (05/26/21 1441)   metronidazole 500 mg (05/26/21 1519)     LOS: 4 days    Time spent: ***    Irine Seal, MD [facility]   To contact  the attending provider between 7A-7P or the covering provider during after hours 7P-7A, please log into the web site [email or web address] and access using universal [place] password for that web site. If you do not have the password, please call the hospital operator.  05/26/2021, 5:13 PM

## 2021-05-26 NOTE — Progress Notes (Signed)
PROGRESS NOTE    NAVA SONG  CHY:[id number] DOB: 24-Jan-1981 DOA: 05/21/2021 PCP: Day, [name], MD    Chief Complaint  Patient presents with   Palpitations   Shortness of Breath    Brief Narrative:  41 year old female with past medical history for type 1 diabetes poorly controlled with secondary gastroparesis and peripheral neuropathy as well as hypertension who presented to the emergency room with palpitations, chest tightness and shortness of breath.  Patient found to be in DKA and admitted to the hospitalist service, being started on an insulin drip plus IV fluids.   By 1/31, anion gap corrected and DKA resolved.       Assessment & Plan:   Principal Problem:   DKA (diabetic ketoacidosis) ([place]) Active Problems:   Uncontrolled type 1 diabetes mellitus with hyperglycemia, with long-term current use of insulin (HCC)   AKI (acute kidney injury) ([place])   Transaminitis   Hypertension   Gastroparesis   DKA, type 1 (HCC)   CKD stage G3a/A3, GFR 45-59 and albumin creatinine ratio >300 mg/g (HCC)   Hypokalemia   Prolonged QT interval   Elevated brain natriuretic peptide (BNP) level   Overweight (BMI 25.0-29.9)   Labial swelling: Left   Vulvar abscess   Left genital labial abscess   Sinus tachycardia  #1 DKA, POA/uncontrolled type 1 diabetes mellitus with neuropathy -Patient on presentation noted to be in DKA and placed on an insulin drip, IV fluids. -Anion gap closed and patient transitioned to subcutaneous insulin with sliding scale insulin. -Hemoglobin A1c 9.6 (05/21/2021) -CBG 215. -Continue Semglee 18 units daily.  -Continue meal coverage NovoLog 7 units 3 times daily with meals.  -Continue Neurontin 300 mg 3 times daily to aid with neuropathy.   -Diabetes coordinator following.  -Outpatient follow-up with endocrinology post discharge.  2.  CKD stage IIIb -Stable.  3.  Metabolic acidosis -Patient with CKD stage IIIb, was in DKA and noted to be acidotic with some  improvement with acidosis, anion gap closed, patient transitioned to subcutaneous insulin. -Acidosis fluctuating.   -Fluctuation of acidosis could likely be secondary to acute valvular abscess.   -Continue bicarb tablets 3 times daily.  -Continue IV antibiotics.  -Follow.  4.  Gastroparesis -Patient states history of gastroparesis, has tried Reglan in the past with no significant improvement. -Patient started on Neurontin per Dr. [place] Pink. -Follow-up.  5.  Hypertension/soft blood pressure -Blood pressure noted to be soft felt likely secondary to volume depletion. -Status post IV fluid bolus. -IV albumin every 6 hours ordered.   -1 L normal saline bolus.   -Check a random cortisol level.  -Follow.  6.  Valvular abscess, left, POA -Patient with complaints of left labial swelling. -Patient examined with RN Alphonzo Lemmings as chaperone and area of induration with some redness and exquisite tenderness to palpation noted (05/24/2021). -Case discussed with GYN who recommended pelvic CT for further evaluation which was unremarkable.   -Patient seen in consultation by GYN, Dr. Elgie Congo who was concerned over valvular abscess since patient subsequently underwent incision and drainage of valvular abscess with purulent drainage noted which was sent for cultures which are currently pending.   -Continue twice daily packing of wound per GYN recommendations.   -Continue IV Rocephin, IV Flagyl, doxycycline, warm compresses pending culture results.   -We will need outpatient follow-up with GYN 2 to 3 weeks postdischarge.   7.  Hypokalemia/hypomagnesemia -K-Dur 40 mEq p.o. x1. -Magnesium at 2.2. -Repeat labs in AM.  8.  Elevated BNP, POA -Patient  with no signs or symptoms of volume overload on examination. -2D echo with normal EF, no wall motion abnormalities. -Urine output not properly recorded.  9.  Transaminitis -??  Etiology. -LFTs trending down. -Acute hepatitis panel negative.   -Right upper  quadrant ultrasound concerning for hepatic steatosis, hyperechoic mass in the right hepatic lobe possibly hemangioma. -Outpatient follow-up.  10.  Overweight -Lifestyle modification.  11.  Sinus tachycardia -Likely secondary to electrolyte abnormalities, borderline/low blood pressure, valvular abscess. -Replete electrolytes. -TSH 3.922.   -Receiving IV albumin.   -While liter fluid bolus.   -Continue empiric IV antibiotics.    DVT prophylaxis: SCDs Code Status: Full Family Communication: Updated patient.  No family at bedside. Disposition:   Status is: Inpatient Remains inpatient appropriate because: Severity of illness.  Planned Discharge Destination: Home         Consultants:  GYN: Dr. Elgie Congo 05/24/2021  Procedures:  Chest x-ray 05/21/2021 2D echo 05/22/2021 CT pelvis 05/24/2021 Right upper quadrant ultrasound 05/24/2021 Incision and drainage of vulvar abscess per GYN: Dr. Elgie Congo 05/24/2021   Antimicrobials:  IV Rocephin 05/24/2021>>> IV Flagyl 05/24/2021>>>> Doxycycline 05/24/2021>>>>>   Subjective: Laying in bed.  No chest pain.  No shortness of breath.  Denies any significant abdominal pain.  Oral intake improving.  Denies any dizziness or lightheadedness.   Objective: Vitals:   05/26/21 0158 05/26/21 0532 05/26/21 0752 05/26/21 1014  BP: (!) 82/61 94/67  96/63  Pulse: 87 87  (!) 109  Resp: 16 16  18   Temp: 98.5 F (36.9 C) 98.1 F (36.7 C)  98.9 F (37.2 C)  TempSrc: Oral Oral  Oral  SpO2: 98% 97% 97% 97%  Weight:      Height:        Intake/Output Summary (Last 24 hours) at 05/26/2021 1246 Last data filed at 05/26/2021 0600 Gross per 24 hour  Intake 2208.64 ml  Output --  Net 2208.64 ml    Filed Weights   05/21/21 1350 05/21/21 1617  Weight: 77.1 kg 74.2 kg    Examination:  General exam: NAD. Respiratory system: Lungs clear to rotation bilaterally.  No wheezes, no crackles, no rhonchi.  Normal respiratory effort.  Speaking in full sentences.    Cardiovascular system: Regular rate and rhythm no murmurs rubs or gallops.  No JVD.  No lower extremity edema.  Gastrointestinal system: Abdomen is soft, nontender, nondistended, positive bowel sounds.  No rebound.  No guarding.   Central nervous system: Alert and oriented. No focal neurological deficits. Genitourinary: Deferred.  Extremities: Symmetric 5 x 5 power. Skin: No rashes, lesions or ulcers Psychiatry: Judgement and insight appear normal. Mood & affect appropriate.     Data Reviewed: I have personally reviewed following labs and imaging studies  CBC: Recent Labs  Lab 05/21/21 1110 05/22/21 0116 05/23/21 0421 05/24/21 0427 05/25/21 0425 05/26/21 0534  WBC 10.2 6.8 5.4 6.4 4.7 3.3*  NEUTROABS 6.0  --   --  3.2 3.7 1.6*  HGB 12.8 10.8* 9.8* 9.6* 10.1* 8.1*  HCT 39.3 31.9* 29.3* 29.1* 30.2* 25.2*  MCV 108.0* 104.6* 106.9* 109.0* 106.3* 108.6*  PLT 251 160 131* 137* 123* 126*     Basic Metabolic Panel: Recent Labs  Lab 05/22/21 0116 05/22/21 0517 05/22/21 0934 05/23/21 0421 05/24/21 0427 05/25/21 0425 05/26/21 0534  NA 135   < > 137 138 135 136 133*  K 2.8*   < > 5.7* 3.2* 2.9* 5.0 3.2*  CL 107   < > 114* 114* 110 107 103  CO2  17*   < > 14* 16* 18* 14* 20*  GLUCOSE 166*   < > 230* 231* 310* 343* 226*  BUN 18   < > 15 10 10 11 10   CREATININE 2.14*   < > 1.93* 1.71* 1.58* 1.34* 1.57*  CALCIUM 9.1   < > 8.8* 8.3* 8.0* 8.4* 7.8*  MG 1.6*  --  2.2 2.0  --  1.2* 2.2   < > = values in this interval not displayed.     GFR: Estimated Creatinine Clearance: 47.5 mL/min (A) (by C-G formula based on SCr of 1.57 mg/dL (H)).  Liver Function Tests: Recent Labs  Lab 05/21/21 1110 05/22/21 0517 05/23/21 0421 05/24/21 0427 05/26/21 0534  AST 192* 165* 226* 129* 122*  ALT 122* 90* 103* 89* 71*  ALKPHOS 299* 213* 200* 194* 174*  BILITOT 1.7* 0.9 0.6 0.7 0.3  PROT 8.0 6.1* 5.7* 5.8* 5.8*  ALBUMIN 3.8 2.8* 2.6* 2.7* 3.2*     CBG: Recent Labs  Lab  05/25/21 1333 05/25/21 1757 05/25/21 2238 05/26/21 0720 05/26/21 1124  GLUCAP 285* 152* 150* 215* 381*      Recent Results (from the past 240 hour(s))  Resp Panel by RT-PCR (Flu A&B, Covid) Nasopharyngeal Swab     Status: None   Collection Time: 05/21/21 11:41 AM   Specimen: Nasopharyngeal Swab; Nasopharyngeal(NP) swabs in vial transport medium  Result Value Ref Range Status   SARS Coronavirus 2 by RT PCR NEGATIVE NEGATIVE Final    Comment: (NOTE) SARS-CoV-2 target nucleic acids are NOT DETECTED.  The SARS-CoV-2 RNA is generally detectable in upper respiratory specimens during the acute phase of infection. The lowest concentration of SARS-CoV-2 viral copies this assay can detect is 138 copies/mL. A negative result does not preclude SARS-Cov-2 infection and should not be used as the sole basis for treatment or other patient management decisions. A negative result may occur with  improper specimen collection/handling, submission of specimen other than nasopharyngeal swab, presence of viral mutation(s) within the areas targeted by this assay, and inadequate number of viral copies(<138 copies/mL). A negative result must be combined with clinical observations, patient history, and epidemiological information. The expected result is Negative.  Fact Sheet for Patients:  EntrepreneurPulse.com.au  Fact Sheet for Healthcare Providers:  IncredibleEmployment.be  This test is no t yet approved or cleared by the [place] FDA and  has been authorized for detection and/or diagnosis of SARS-CoV-2 by FDA under an Emergency Use Authorization (EUA). This EUA will remain  in effect (meaning this test can be used) for the duration of the COVID-19 declaration under Section 564(b)(1) of the Act, 21 U.S.C.section 360bbb-3(b)(1), unless the authorization is terminated  or revoked sooner.       Influenza A by PCR NEGATIVE NEGATIVE Final   Influenza B by  PCR NEGATIVE NEGATIVE Final    Comment: (NOTE) The Xpert Xpress SARS-CoV-2/FLU/RSV plus assay is intended as an aid in the diagnosis of influenza from Nasopharyngeal swab specimens and should not be used as a sole basis for treatment. Nasal washings and aspirates are unacceptable for Xpert Xpress SARS-CoV-2/FLU/RSV testing.  Fact Sheet for Patients: EntrepreneurPulse.com.au  Fact Sheet for Healthcare Providers: IncredibleEmployment.be  This test is not yet approved or cleared by the [place] FDA and has been authorized for detection and/or diagnosis of SARS-CoV-2 by FDA under an Emergency Use Authorization (EUA). This EUA will remain in effect (meaning this test can be used) for the duration of the COVID-19 declaration under Section 564(b)(1) of the  Act, 21 U.S.C. section 360bbb-3(b)(1), unless the authorization is terminated or revoked.  Performed at [facility], [place] [address], [place], [place] [address]   MRSA Next Gen by PCR, Nasal     Status: None   Collection Time: 05/21/21  5:08 PM   Specimen: Nasal Mucosa; Nasal Swab  Result Value Ref Range Status   MRSA by PCR Next Gen NOT DETECTED NOT DETECTED Final    Comment: (NOTE) The GeneXpert MRSA Assay (FDA approved for NASAL specimens only), is one component of a comprehensive MRSA colonization surveillance program. It is not intended to diagnose MRSA infection nor to guide or monitor treatment for MRSA infections. Test performance is not FDA approved in patients less than 62 years old. Performed at [facility], [place] [address], [place], [place] [address]   Aerobic Culture w Gram Stain (superficial specimen)     Status: None (Preliminary result)   Collection Time: 05/24/21  6:22 PM   Specimen: Labia  Result Value Ref Range Status   Specimen Description   Final    LABIA Performed at [facility] [address]  [place], [place] [address]    Special Requests   Final    NONE Performed at [facility], [place] [address], [place], [place] [address]    Gram Stain   Final    FEW WBC PRESENT, PREDOMINANTLY MONONUCLEAR FEW GRAM POSITIVE RODS RARE GRAM POSITIVE COCCI RARE GRAM NEGATIVE RODS    Culture   Final    CULTURE REINCUBATED FOR BETTER GROWTH Performed at [facility], [place] [address], [place], [place] [address]    Report Status PENDING  Incomplete          Radiology Studies: CT PELVIS WO CONTRAST  Result Date: 05/24/2021 CLINICAL DATA:  Soft tissue infection/abscess suspected. EXAM: CT PELVIS WITHOUT CONTRAST TECHNIQUE: Multidetector CT imaging of the pelvis was performed following the standard protocol without intravenous contrast. RADIATION DOSE REDUCTION: This exam was performed according to the departmental dose-optimization program which includes automated exposure control, adjustment of the mA and/or kV according to patient size and/or use of iterative reconstruction technique. COMPARISON:  None. FINDINGS: Urinary Tract:  Urinary bladder appears within normal limits. Bowel: No evidence of bowel obstruction. No bowel wall edema visualized. Appendix is normal. Vascular/Lymphatic: No pathologically enlarged lymph nodes. No significant vascular abnormality seen. Reproductive: IUD in the uterus. No suspicious adnexal mass identified. Other: Small to moderate amount of free fluid in the pelvis. No focal fluid collections identified in the soft tissues. Musculoskeletal: No acute osseous abnormality identified. No suspicious bony lesions. IMPRESSION: 1. No acute process or focal collection identified to suggest abscess, given limitations of noncontrast study. 2. Free fluid in the pelvis which is nonspecific, most likely physiologic. Electronically Signed   By: Ofilia Neas M.D.   On: 05/24/2021 14:52        Scheduled Meds:  cholecalciferol  1,000 Units Oral Daily    doxycycline  100 mg Oral Q12H   famotidine  40 mg Oral BID   feeding supplement  1 Container Oral TID BM   gabapentin  300 mg Oral TID   insulin aspart  0-5 Units Subcutaneous QHS   insulin aspart  0-9 Units Subcutaneous TID WC   insulin aspart  7 Units Subcutaneous TID WC   insulin glargine-yfgn  18 Units Subcutaneous QHS   mometasone-formoterol  2 puff Inhalation BID   montelukast  10 mg Oral QHS   multivitamin  with minerals  1 tablet Oral Daily   pantoprazole  40 mg Oral Daily   sodium bicarbonate  650 mg Oral TID   Continuous Infusions:  sodium chloride 10 mL/hr at 05/22/21 1600   albumin human 25 g (05/26/21 0430)   cefTRIAXone (ROCEPHIN)  IV 2 g (05/25/21 1431)   metronidazole 500 mg (05/26/21 0303)     LOS: 4 days    Time spent: 40 minutes    Irine Seal, MD [facility]   To contact the attending provider between 7A-7P or the covering provider during after hours 7P-7A, please log into the web site [email or web address] and access using universal [place] password for that web site. If you do not have the password, please call the hospital operator.  05/26/2021, 12:46 PM

## 2021-05-27 ENCOUNTER — Inpatient Hospital Stay (HOSPITAL_COMMUNITY): Payer: Medicare Other

## 2021-05-27 DIAGNOSIS — R509 Fever, unspecified: Secondary | ICD-10-CM | POA: Clinically undetermined

## 2021-05-27 LAB — AEROBIC CULTURE W GRAM STAIN (SUPERFICIAL SPECIMEN)

## 2021-05-27 LAB — BASIC METABOLIC PANEL
Anion gap: 14 (ref 5–15)
BUN: 9 mg/dL (ref 6–20)
CO2: 15 mmol/L — ABNORMAL LOW (ref 22–32)
Calcium: 8.4 mg/dL — ABNORMAL LOW (ref 8.9–10.3)
Chloride: 109 mmol/L (ref 98–111)
Creatinine, Ser: 1.37 mg/dL — ABNORMAL HIGH (ref 0.44–1.00)
GFR, Estimated: 50 mL/min — ABNORMAL LOW (ref >60)
Glucose, Bld: 223 mg/dL — ABNORMAL HIGH (ref 70–99)
Potassium: 3.8 mmol/L (ref 3.5–5.1)
Sodium: 138 mmol/L (ref 135–145)

## 2021-05-27 LAB — URINALYSIS, ROUTINE W REFLEX MICROSCOPIC
Bacteria, UA: NONE SEEN
Bilirubin Urine: NEGATIVE
Glucose, UA: NEGATIVE mg/dL
Hgb urine dipstick: NEGATIVE
Ketones, ur: 40 mg/dL — AB
Leukocytes,Ua: NEGATIVE
Nitrite: NEGATIVE
Protein, ur: NEGATIVE mg/dL
Specific Gravity, Urine: 1.02 (ref 1.005–1.030)
pH: 6 (ref 5.0–8.0)

## 2021-05-27 LAB — GLUCOSE, CAPILLARY
Glucose-Capillary: 218 mg/dL — ABNORMAL HIGH (ref 70–99)
Glucose-Capillary: 327 mg/dL — ABNORMAL HIGH (ref 70–99)
Glucose-Capillary: 301 mg/dL — ABNORMAL HIGH (ref 70–99)
Glucose-Capillary: 316 mg/dL — ABNORMAL HIGH (ref 70–99)

## 2021-05-27 MED ORDER — INSULIN GLARGINE-YFGN 100 UNIT/ML ~~LOC~~ SOLN
20.0000 [IU] | Freq: Every day | SUBCUTANEOUS | Status: DC
  Administered 2021-05-27: 20 [IU] via SUBCUTANEOUS
  Filled 2021-05-27 (×2): qty 0.2

## 2021-05-27 MED ORDER — SODIUM CHLORIDE 0.9 % IV SOLN: INTRAVENOUS | Status: AC

## 2021-05-27 NOTE — Plan of Care (Signed)
  Problem: Clinical Measurements: Goal: Diagnostic test results will improve Outcome: Progressing   Problem: Clinical Measurements: Goal: Respiratory complications will improve Outcome: Progressing   Problem: Activity: Goal: Risk for activity intolerance will decrease Outcome: Progressing   Problem: Pain Managment: Goal: General experience of comfort will improve Outcome: Progressing   

## 2021-05-27 NOTE — Progress Notes (Addendum)
PROGRESS NOTE    Anna Garcia  TAV:[id number] DOB: 17-Jan-1981 DOA: 05/21/2021 PCP: Day, [name], MD    Chief Complaint  Patient presents with   Palpitations   Shortness of Breath    Brief Narrative:  41 year old female with past medical history for type 1 diabetes poorly controlled with secondary gastroparesis and peripheral neuropathy as well as hypertension who presented to the emergency room with palpitations, chest tightness and shortness of breath.  Patient found to be in DKA and admitted to the hospitalist service, being started on an insulin drip plus IV fluids.   By 1/31, anion gap corrected and DKA resolved.       Assessment & Plan:   Principal Problem:   DKA (diabetic ketoacidosis) ([facility]) Active Problems:   Uncontrolled type 1 diabetes mellitus with hyperglycemia, with long-term current use of insulin (HCC)   AKI (acute kidney injury) ([facility])   Transaminitis   Hypertension   Gastroparesis   DKA, type 1 (HCC)   CKD stage G3a/A3, GFR 45-59 and albumin creatinine ratio >300 mg/g (HCC)   Hypokalemia   Prolonged QT interval   Elevated brain natriuretic peptide (BNP) level   Overweight (BMI 25.0-29.9)   Labial swelling: Left   Vulvar abscess   Left genital labial abscess   Sinus tachycardia   Fever  #1 DKA, POA/uncontrolled type 1 diabetes mellitus with neuropathy -Patient on presentation noted to be in DKA and placed on an insulin drip, IV fluids. -Anion gap closed and patient transitioned to subcutaneous insulin with sliding scale insulin. -Hemoglobin A1c 9.6 (05/21/2021) -CBG 218 this morning. -Increase Semglee to 20 units daily.  -Continue meal coverage NovoLog 7 units 3 times daily with meals.  -Continue Neurontin 300 mg 3 times daily to aid with neuropathy.   -Diabetes coordinator following.  -Outpatient follow-up with endocrinology post discharge.  2.  CKD stage IIIb -Stable.  3.  Metabolic acidosis -Patient with CKD stage IIIb, was in DKA and noted to  be acidotic with some improvement with acidosis, anion gap closed, patient transitioned to subcutaneous insulin. -Acidosis fluctuating.   -Fluctuation of acidosis could likely be secondary to acute valvular abscess.   -Continue bicarb tablets 3 times daily.  -Continue empiric antibiotics.  -Follow.  4.  Gastroparesis -Patient states history of gastroparesis, has tried Reglan in the past with no significant improvement. -Patient started on Neurontin per Dr. [place] Pink. -Follow-up.  5.  Hypertension/soft blood pressure -Blood pressure noted to be soft felt likely secondary to volume depletion. -Status post IV fluid bolus. -Status post IV albumin x24 hours.   -Random cortisol level at 9.8.   -Place on gentle hydration for the next 24 hours.   -Follow.  6.  Valvular abscess, left, POA -Patient with complaints of left labial swelling. -Patient examined with RN Alphonzo Lemmings as chaperone and area of induration with some redness and exquisite tenderness to palpation noted (05/24/2021). -Case discussed with GYN who recommended pelvic CT for further evaluation which was unremarkable.   -Patient seen in consultation by GYN, Dr. Elgie Congo who was concerned over valvular abscess since patient subsequently underwent incision and drainage of valvular abscess with purulent drainage noted which was sent for cultures which are currently pending.   -Continue twice daily packing of wound per GYN recommendations.   -Continue IV Rocephin, IV Flagyl, doxycycline, warm compresses pending culture results.   -We will need outpatient follow-up with GYN 2- 3 weeks postdischarge.   7.  Hypokalemia/hypomagnesemia -Repleted.   -Potassium at 3.8.   -Magnesium  at 2.2.    8.  Elevated BNP, POA -Patient with no signs or symptoms of volume overload on examination. -2D echo with normal EF, no wall motion abnormalities. -Urine output not properly recorded.  9.  Transaminitis -??  Etiology. -LFTs trending down. -Acute  hepatitis panel negative.   -Right upper quadrant ultrasound concerning for hepatic steatosis, hyperechoic mass in the right hepatic lobe possibly hemangioma. -Outpatient follow-up.  10.  Overweight -Lifestyle modification.  11.  Sinus tachycardia -Likely secondary to electrolyte abnormalities, borderline/low blood pressure, valvular abscess. -Electrolytes repleted.  -TSH 3.922.   -Status post IV albumin.   -IV fluids.   -Continue empiric antibiotics.  12.  Fever -Patient noted to have a T-max of 102.3. -Could be secondary to valvular abscess. -Check a chest x-ray, check blood cultures x2. -Continue empiric antibiotics.   DVT prophylaxis: SCDs Code Status: Full Family Communication: Updated patient.  No family at bedside. Disposition: Home.  Status is: Inpatient Remains inpatient appropriate because: Severity of illness.  Planned Discharge Destination: Home         Consultants:  GYN: Dr. Elgie Congo 05/24/2021  Procedures:  Chest x-ray 05/21/2021 2D echo 05/22/2021 CT pelvis 05/24/2021 Right upper quadrant ultrasound 05/24/2021 Incision and drainage of vulvar abscess per GYN: Dr. Elgie Congo 05/24/2021   Antimicrobials:  IV Rocephin 05/24/2021>>> IV Flagyl 05/24/2021>>>> Doxycycline 05/24/2021>>>>>   Subjective: Sitting up in bed eating lunch.  No chest pain.  No shortness of breath.  No abdominal pain.  No lightheadedness or dizziness.  Patient noted to have a T-max of 102.3 at 2115 hrs. on 05/26/2021.  No further fevers.  Denies any dysuria.  No cough.  Overall feeling better.   Objective: Vitals:   05/27/21 0552 05/27/21 0753 05/27/21 0908 05/27/21 1425  BP: 107/65  105/75 95/67  Pulse: 96  95 99  Resp: 18  18 16   Temp: 99.8 F (37.7 C)  98.1 F (36.7 C) 99.8 F (37.7 C)  TempSrc: Oral  Oral Oral  SpO2:  97% 99% 94%  Weight:      Height:        Intake/Output Summary (Last 24 hours) at 05/27/2021 1706 Last data filed at 05/27/2021 1519 Gross per 24 hour  Intake 3686.13 ml   Output 0 ml  Net 3686.13 ml    Filed Weights   05/21/21 1350 05/21/21 1617  Weight: 77.1 kg 74.2 kg    Examination:  General exam: No acute distress. Respiratory system: CTA B.  No wheezes, no crackles, no rhonchi.  Normal respiratory effort.  Speaking in full sentences. Cardiovascular system: RRR no murmurs rubs or gallops.  No JVD.  No lower extremity edema.   Gastrointestinal system: Abdomen is soft, nontender, nondistended, positive bowel sounds.  No rebound.  No guarding.  Central nervous system: Alert and oriented. No focal neurological deficits. Genitourinary: Deferred.  Extremities: Symmetric 5 x 5 power. Skin: No rashes, lesions or ulcers Psychiatry: Judgement and insight appear normal. Mood & affect appropriate.     Data Reviewed: I have personally reviewed following labs and imaging studies  CBC: Recent Labs  Lab 05/21/21 1110 05/22/21 0116 05/23/21 0421 05/24/21 0427 05/25/21 0425 05/26/21 0534  WBC 10.2 6.8 5.4 6.4 4.7 3.3*  NEUTROABS 6.0  --   --  3.2 3.7 1.6*  HGB 12.8 10.8* 9.8* 9.6* 10.1* 8.1*  HCT 39.3 31.9* 29.3* 29.1* 30.2* 25.2*  MCV 108.0* 104.6* 106.9* 109.0* 106.3* 108.6*  PLT 251 160 131* 137* 123* 126*     Basic Metabolic Panel:  Recent Labs  Lab 05/22/21 0116 05/22/21 0517 05/22/21 0934 05/23/21 0421 05/24/21 0427 05/25/21 0425 05/26/21 0534 05/27/21 0447  NA 135   < > 137 138 135 136 133* 138  K 2.8*   < > 5.7* 3.2* 2.9* 5.0 3.2* 3.8  CL 107   < > 114* 114* 110 107 103 109  CO2 17*   < > 14* 16* 18* 14* 20* 15*  GLUCOSE 166*   < > 230* 231* 310* 343* 226* 223*  BUN 18   < > 15 10 10 11 10 9   CREATININE 2.14*   < > 1.93* 1.71* 1.58* 1.34* 1.57* 1.37*  CALCIUM 9.1   < > 8.8* 8.3* 8.0* 8.4* 7.8* 8.4*  MG 1.6*  --  2.2 2.0  --  1.2* 2.2  --    < > = values in this interval not displayed.     GFR: Estimated Creatinine Clearance: 54.4 mL/min (A) (by C-G formula based on SCr of 1.37 mg/dL (H)).  Liver Function Tests: Recent  Labs  Lab 05/21/21 1110 05/22/21 0517 05/23/21 0421 05/24/21 0427 05/26/21 0534  AST 192* 165* 226* 129* 122*  ALT 122* 90* 103* 89* 71*  ALKPHOS 299* 213* 200* 194* 174*  BILITOT 1.7* 0.9 0.6 0.7 0.3  PROT 8.0 6.1* 5.7* 5.8* 5.8*  ALBUMIN 3.8 2.8* 2.6* 2.7* 3.2*     CBG: Recent Labs  Lab 05/26/21 1551 05/26/21 2117 05/27/21 0730 05/27/21 1128 05/27/21 1617  GLUCAP 170* 181* 218* 327* 301*      Recent Results (from the past 240 hour(s))  Resp Panel by RT-PCR (Flu A&B, Covid) Nasopharyngeal Swab     Status: None   Collection Time: 05/21/21 11:41 AM   Specimen: Nasopharyngeal Swab; Nasopharyngeal(NP) swabs in vial transport medium  Result Value Ref Range Status   SARS Coronavirus 2 by RT PCR NEGATIVE NEGATIVE Final    Comment: (NOTE) SARS-CoV-2 target nucleic acids are NOT DETECTED.  The SARS-CoV-2 RNA is generally detectable in upper respiratory specimens during the acute phase of infection. The lowest concentration of SARS-CoV-2 viral copies this assay can detect is 138 copies/mL. A negative result does not preclude SARS-Cov-2 infection and should not be used as the sole basis for treatment or other patient management decisions. A negative result may occur with  improper specimen collection/handling, submission of specimen other than nasopharyngeal swab, presence of viral mutation(s) within the areas targeted by this assay, and inadequate number of viral copies(<138 copies/mL). A negative result must be combined with clinical observations, patient history, and epidemiological information. The expected result is Negative.  Fact Sheet for Patients:  EntrepreneurPulse.com.au  Fact Sheet for Healthcare Providers:  IncredibleEmployment.be  This test is no t yet approved or cleared by the [place] FDA and  has been authorized for detection and/or diagnosis of SARS-CoV-2 by FDA under an Emergency Use Authorization (EUA). This  EUA will remain  in effect (meaning this test can be used) for the duration of the COVID-19 declaration under Section 564(b)(1) of the Act, 21 U.S.C.section 360bbb-3(b)(1), unless the authorization is terminated  or revoked sooner.       Influenza A by PCR NEGATIVE NEGATIVE Final   Influenza B by PCR NEGATIVE NEGATIVE Final    Comment: (NOTE) The Xpert Xpress SARS-CoV-2/FLU/RSV plus assay is intended as an aid in the diagnosis of influenza from Nasopharyngeal swab specimens and should not be used as a sole basis for treatment. Nasal washings and aspirates are unacceptable for Xpert Xpress SARS-CoV-2/FLU/RSV testing.  Fact Sheet for Patients: EntrepreneurPulse.com.au  Fact Sheet for Healthcare Providers: IncredibleEmployment.be  This test is not yet approved or cleared by the [place] FDA and has been authorized for detection and/or diagnosis of SARS-CoV-2 by FDA under an Emergency Use Authorization (EUA). This EUA will remain in effect (meaning this test can be used) for the duration of the COVID-19 declaration under Section 564(b)(1) of the Act, 21 U.S.C. section 360bbb-3(b)(1), unless the authorization is terminated or revoked.  Performed at [facility], [place] [address] [place], [place] [address]   MRSA Next Gen by PCR, Nasal     Status: None   Collection Time: 05/21/21  5:08 PM   Specimen: Nasal Mucosa; Nasal Swab  Result Value Ref Range Status   MRSA by PCR Next Gen NOT DETECTED NOT DETECTED Final    Comment: (NOTE) The GeneXpert MRSA Assay (FDA approved for NASAL specimens only), is one component of a comprehensive MRSA colonization surveillance program. It is not intended to diagnose MRSA infection nor to guide or monitor treatment for MRSA infections. Test performance is not FDA approved in patients less than 88 years old. Performed at [facility], [place] [address] [place], [place]  [address]   Aerobic Culture w Gram Stain (superficial specimen)     Status: None   Collection Time: 05/24/21  6:22 PM   Specimen: Labia  Result Value Ref Range Status   Specimen Description   Final    LABIA Performed at [place] [address] [place], [place] [address]    Special Requests   Final    NONE Performed at [facility], [place] [address] [place], [place] [address]    Gram Stain   Final    FEW WBC PRESENT, PREDOMINANTLY MONONUCLEAR FEW GRAM POSITIVE RODS RARE GRAM POSITIVE COCCI RARE GRAM NEGATIVE RODS    Culture   Final    FEW LACTOBACILLUS SPECIES Standardized susceptibility testing for this organism is not available. Performed at [facility], [place] [address], [place], [place] [address]    Report Status 05/27/2021 FINAL  Final          Radiology Studies: DG Chest 2 View  Result Date: 05/27/2021 CLINICAL DATA:  Shortness of breath, palpitations, fever EXAM: CHEST - 2 VIEW COMPARISON:  Chest radiograph done on 05/21/2021 CT chest done on 12/20 2012 FINDINGS: Cardiac size is within normal limits. There is interval appearance of linear densities in both lower lung fields. There is minimal blunting of lateral CP angles. There is no pneumothorax. IMPRESSION: There are linear densities in both lower lung fields suggesting subsegmental atelectasis. Small bilateral pleural effusions. Electronically Signed   By: Elmer Picker M.D.   On: 05/27/2021 12:20        Scheduled Meds:  cholecalciferol  1,000 Units Oral Daily   doxycycline  100 mg Oral Q12H   famotidine  40 mg Oral BID   feeding supplement  1 Container Oral TID BM   gabapentin  300 mg Oral TID   insulin aspart  0-5 Units Subcutaneous QHS   insulin aspart  0-9 Units Subcutaneous TID WC   insulin aspart  7 Units Subcutaneous TID WC   insulin glargine-yfgn  20 Units Subcutaneous QHS   mometasone-formoterol  2 puff Inhalation BID   montelukast  10 mg Oral QHS    multivitamin with minerals  1 tablet Oral Daily   pantoprazole  40 mg Oral Daily   sodium bicarbonate  650  mg Oral TID   Continuous Infusions:  sodium chloride 10 mL/hr at 05/27/21 1519   sodium chloride 75 mL/hr at 05/27/21 1519   cefTRIAXone (ROCEPHIN)  IV Stopped (05/27/21 1355)   metronidazole Stopped (05/27/21 1507)     LOS: 5 days    Time spent: 40 minutes    Irine Seal, MD [facility]   To contact the attending provider between 7A-7P or the covering provider during after hours 7P-7A, please log into the web site [email or web address] and access using universal [place] password for that web site. If you do not have the password, please call the hospital operator.  05/27/2021, 5:06 PM

## 2021-05-28 LAB — URINE CULTURE: Culture: NO GROWTH

## 2021-05-28 LAB — MAGNESIUM: Magnesium: 1.4 mg/dL — ABNORMAL LOW (ref 1.7–2.4)

## 2021-05-28 LAB — CBC WITH DIFFERENTIAL/PLATELET
Abs Immature Granulocytes: 0.03 10*3/uL (ref 0.00–0.07)
Basophils Absolute: 0 10*3/uL (ref 0.0–0.1)
Basophils Relative: 1 %
Eosinophils Absolute: 0.3 10*3/uL (ref 0.0–0.5)
Eosinophils Relative: 7 %
HCT: 26.7 % — ABNORMAL LOW (ref 36.0–46.0)
Hemoglobin: 8.7 g/dL — ABNORMAL LOW (ref 12.0–15.0)
Immature Granulocytes: 1 %
Lymphocytes Relative: 37 %
Lymphs Abs: 1.5 10*3/uL (ref 0.7–4.0)
MCH: 35.4 pg — ABNORMAL HIGH (ref 26.0–34.0)
MCHC: 32.6 g/dL (ref 30.0–36.0)
MCV: 108.5 fL — ABNORMAL HIGH (ref 80.0–100.0)
Monocytes Absolute: 0.5 10*3/uL (ref 0.1–1.0)
Monocytes Relative: 11 %
Neutro Abs: 1.8 10*3/uL (ref 1.7–7.7)
Neutrophils Relative %: 43 %
Platelets: 124 10*3/uL — ABNORMAL LOW (ref 150–400)
RBC: 2.46 MIL/uL — ABNORMAL LOW (ref 3.87–5.11)
RDW: 15.6 % — ABNORMAL HIGH (ref 11.5–15.5)
WBC: 4.1 10*3/uL (ref 4.0–10.5)
nRBC: 0 % (ref 0.0–0.2)

## 2021-05-28 LAB — GLUCOSE, CAPILLARY
Glucose-Capillary: 383 mg/dL — ABNORMAL HIGH (ref 70–99)
Glucose-Capillary: 339 mg/dL — ABNORMAL HIGH (ref 70–99)
Glucose-Capillary: 267 mg/dL — ABNORMAL HIGH (ref 70–99)
Glucose-Capillary: 115 mg/dL — ABNORMAL HIGH (ref 70–99)

## 2021-05-28 LAB — BASIC METABOLIC PANEL
Anion gap: 10 (ref 5–15)
BUN: 8 mg/dL (ref 6–20)
CO2: 19 mmol/L — ABNORMAL LOW (ref 22–32)
Calcium: 8.3 mg/dL — ABNORMAL LOW (ref 8.9–10.3)
Chloride: 108 mmol/L (ref 98–111)
Creatinine, Ser: 1.34 mg/dL — ABNORMAL HIGH (ref 0.44–1.00)
GFR, Estimated: 52 mL/min — ABNORMAL LOW (ref >60)
Glucose, Bld: 445 mg/dL — ABNORMAL HIGH (ref 70–99)
Potassium: 3.9 mmol/L (ref 3.5–5.1)
Sodium: 137 mmol/L (ref 135–145)

## 2021-05-28 MED ORDER — LANTUS SOLOSTAR 100 UNIT/ML ~~LOC~~ SOPN: 22.0000 [IU] | PEN_INJECTOR | Freq: Every day | SUBCUTANEOUS | 1 refills | Status: DC

## 2021-05-28 MED ORDER — INSULIN ASPART 100 UNIT/ML IJ SOLN: 10.0000 [IU] | Freq: Three times a day (TID) | INTRAMUSCULAR | Status: DC

## 2021-05-28 MED ORDER — MAGNESIUM OXIDE -MG SUPPLEMENT 400 (240 MG) MG PO TABS
400.0000 mg | ORAL_TABLET | Freq: Two times a day (BID) | ORAL | 0 refills | Status: AC
Start: 2021-05-28 — End: 2021-06-11

## 2021-05-28 MED ORDER — MAGNESIUM SULFATE 4 GM/100ML IV SOLN
4.0000 g | Freq: Once | INTRAVENOUS | Status: AC
  Administered 2021-05-28: 4 g via INTRAVENOUS
  Filled 2021-05-28: qty 100

## 2021-05-28 MED ORDER — INSULIN ASPART 100 UNIT/ML IJ SOLN
10.0000 [IU] | Freq: Three times a day (TID) | INTRAMUSCULAR | Status: DC
  Administered 2021-05-28 (×2): 10 [IU] via SUBCUTANEOUS

## 2021-05-28 MED ORDER — SODIUM BICARBONATE 650 MG PO TABS: 650.0000 mg | ORAL_TABLET | Freq: Three times a day (TID) | ORAL | 0 refills | Status: AC

## 2021-05-28 MED ORDER — DOXYCYCLINE HYCLATE 100 MG PO TABS: 100.0000 mg | ORAL_TABLET | Freq: Two times a day (BID) | ORAL | 0 refills | Status: AC

## 2021-05-28 MED ORDER — GABAPENTIN 300 MG PO CAPS: 300.0000 mg | ORAL_CAPSULE | Freq: Three times a day (TID) | ORAL | 1 refills | Status: AC

## 2021-05-28 MED ORDER — COVID-19MRNA BIVAL VACC PFIZER 30 MCG/0.3ML IM SUSP: 0.3000 mL | Freq: Once | INTRAMUSCULAR | Status: DC

## 2021-05-28 MED ORDER — AMOXICILLIN-POT CLAVULANATE 875-125 MG PO TABS: 1.0000 | ORAL_TABLET | Freq: Two times a day (BID) | ORAL | 0 refills | Status: AC

## 2021-05-28 MED ORDER — ACETAMINOPHEN 325 MG PO TABS: 650.0000 mg | ORAL_TABLET | Freq: Four times a day (QID) | ORAL | Status: DC | PRN

## 2021-05-28 MED ORDER — MAGNESIUM OXIDE -MG SUPPLEMENT 400 (240 MG) MG PO TABS
400.0000 mg | ORAL_TABLET | Freq: Two times a day (BID) | ORAL | Status: DC
  Administered 2021-05-28: 400 mg via ORAL
  Filled 2021-05-28: qty 1

## 2021-05-28 MED ORDER — LANTUS SOLOSTAR 100 UNIT/ML ~~LOC~~ SOPN: 22.0000 [IU] | PEN_INJECTOR | Freq: Two times a day (BID) | SUBCUTANEOUS | 1 refills | Status: DC

## 2021-05-28 NOTE — Progress Notes (Signed)
PT demonstrated hands on understanding of IS device.

## 2021-05-28 NOTE — Progress Notes (Signed)
Inpatient Diabetes Program Recommendations  AACE/ADA: New Consensus Statement on Inpatient Glycemic Control (2015)  Target Ranges:  Prepandial:   less than 140 mg/dL      Peak postprandial:   less than 180 mg/dL (1-2 hours)      Critically ill patients:  140 - 180 mg/dL   Lab Results  Component Value Date   GLUCAP 339 (H) 05/28/2021   HGBA1C 9.6 (H) 05/21/2021    Review of Glycemic Control  CBGs:383, 445, 339 mg/dL CBGs above goal of 140-180 mg/dL  Continue to titrate.    Current orders for Inpatient glycemic control: Semglee 20 QHS, Novolog 10 units TID, Novolog 0-9 units TID with meals  Inpatient Diabetes Program Recommendations:    Increase Semglee to 22 units QHS  Follow.  Thank you. Lorenda Peck, RD, LDN, CDE Inpatient Diabetes Coordinator [phone]

## 2021-05-28 NOTE — Discharge Summary (Addendum)
Physician Discharge Summary  Anna Garcia JFH:[id number] DOB: 09-11-1980 DOA: 05/21/2021  PCP: [name], MD  Admit date: 05/21/2021 Discharge date: 05/28/2021  Time spent: 55 minutes  Recommendations for Outpatient Follow-up:  Follow-up with Dr. Braulio Garcia, endocrinology in 2 weeks. Follow-up with Day, [name], MD in 2 weeks.  On follow-up patient blood pressure need to be reassessed.  Patient will need a basic metabolic profile, magnesium level checked to follow-up on electrolytes and renal function. [facility] with [facility] [place] in 3 weeks for follow-up on labial abscess I and D.    Discharge Diagnoses:  Principal Problem:   DKA (diabetic ketoacidosis) ([place]) Active Problems:   Uncontrolled type 1 diabetes mellitus with hyperglycemia, with long-term current use of insulin (HCC)   AKI (acute kidney injury) ([place])   Transaminitis   Hypertension   Gastroparesis   DKA, type 1 (HCC)   CKD stage G3a/A3, GFR 45-59 and albumin creatinine ratio >300 mg/g (HCC)   Hypokalemia   Prolonged QT interval   Elevated brain natriuretic peptide (BNP) level   Overweight (BMI 25.0-29.9)   Labial swelling: Left   Vulvar abscess   Left genital labial abscess   Sinus tachycardia   Fever   Discharge Condition: Stable and improved  Diet recommendation: Carb modified diet  Filed Weights   05/21/21 1350 05/21/21 1617  Weight: 77.1 kg 74.2 kg    History of present illness:  HPI per Dr. [name] is a 41 y.o. female with medical history significant of type 1 diabetes, gastroparesis, hypertension, diabetic peripheral neuropathy, overweight, tobacco use who is coming to the emergency department due to palpitations, shortness of breath and chest tightness.  She has also been having occasional lightheadedness, mostly positional, multiple episodes of nausea and emesis.  She has been wheezing at times and using her albuterol MDI.  She stated that she has been using  her insulin as she should.  Denied any dietary indiscretions.  Denied diarrhea, constipation, melena or hematochezia.  No flank pain, dysuria, frequency or hematuria.  Denied polyuria, polydipsia, polyphagia or blurred vision.   ED Course: Initial vital signs are pulse 115, respirations 16, BP 108/66 and O2 sat 100% on room air.  Patient was given 3600 mL of LR bolus, potassium replacement IVPB and was started on an insulin infusion.   Lab work: CBC is her white count of 10.2, hemoglobin 12.8 g/dL and platelets 251.  BNP 297.4 pg/mL, lipase 100 units/L, BHA was more than 8.0 mmol/L.  CMP showed sodium 134, potassium 3.0, chloride 100 CO2 10 mmol/L.  Anion gap was more than 20.  Glucose 368, BUN 24 creatinine 3.07 mg/dL.  Venous blood gas showed a pH of 7.27, PCO2 of 26.2 and PO2 of 40.4 mmHg.  Bicarbonate was 11.8 and acid-base deficit 13.5 mmol/L.   Imaging: 1 view chest radiograph did not have any active cardiopulmonary disease.  Hospital Course:  #1 DKA, POA/uncontrolled type 1 diabetes mellitus with neuropathy -Patient on presentation noted to be in DKA and placed on an insulin drip, IV fluids. -Anion gap closed and patient transitioned to subcutaneous insulin with sliding scale insulin. -DKA likely secondary to valvular/labial abscess. -Hemoglobin A1c 9.6 (05/21/2021) -Patient placed on Semglee long-acting insulin which was uptitrated and patient was discharged on 22 units daily of long-acting insulin.   -Patient also maintained on meal coverage NovoLog requiring 10 units 3 times daily with meals as well as sliding scale insulin.   -Patient seen by  diabetic coordinator during the hospitalization.   -Patient also treated with full course of antibiotics for valvular/labial abscess and seen by GYN and underwent I and D.  -Outpatient follow-up with primary endocrinologist.    2.  CKD stage IIIb -Stable.  3.  Metabolic acidosis -Patient with CKD stage IIIb, was in DKA and noted to be acidotic  with some improvement with acidosis, anion gap closed, patient transitioned to subcutaneous insulin. -Acidosis fluctuating.   -Fluctuation of acidosis could likely be secondary to acute valvular abscess and CKD..   -Patient maintained on bicarb tablets 3 times daily which she will be discharged on for 7 more days.   -Patient also treated with antibiotics for valvular abscess.   -Metabolic acidosis improved.   -Outpatient follow-up with PCP.   4.  Gastroparesis -Patient states history of gastroparesis, has tried Reglan in the past with no significant improvement. -Patient started on Neurontin per Dr. [place] Garcia.  5.  Hypertension/soft blood pressure -Blood pressure noted to be soft felt likely secondary to volume depletion. -Status post IV fluid bolus. -Status post IV albumin x24 hours.   -Random cortisol level at 9.8.   -Patient hydrated gently with IV fluids.   -Antihypertensive medications of ARB was discontinued during the hospitalization.   -Outpatient follow-up.    6.  Valvular abscess, left, POA -Patient with complaints of left labial swelling. -Patient examined with RN Alphonzo Lemmings as chaperone and area of induration with some redness and exquisite tenderness to palpation noted (05/24/2021). -Case discussed with GYN who recommended pelvic CT for further evaluation which was unremarkable.   -Patient seen in consultation by GYN, Dr. Elgie Garcia who was concerned over valvular abscess since patient subsequently underwent incision and drainage of valvular abscess with purulent drainage noted which was sent for cultures which are grew out lactobacillus. -Patient was maintained on packing the wound twice daily as per GYN recommendations. -Patient placed empirically on IV Rocephin, IV Flagyl, doxycycline warm compresses as needed during the hospitalization will be discharged home on 3 days of Augmentin and doxycycline to complete a 7-day course of antibiotic treatment.  7.   Hypokalemia/hypomagnesemia -Repleted.   -Outpatient follow-up.  8.  Elevated BNP, POA -Patient with no signs or symptoms of volume overload on examination. -2D echo with normal EF, no wall motion abnormalities. -Urine output not properly recorded.  9.  Transaminitis -??  Etiology. -LFTs trended down. -Acute hepatitis panel negative.   -Right upper quadrant ultrasound concerning for hepatic steatosis, hyperechoic mass in the right hepatic lobe possibly hemangioma. -Outpatient follow-up.  10.  Overweight -Lifestyle modification.  11.  Sinus tachycardia -Likely secondary to electrolyte abnormalities, borderline/low blood pressure, valvular abscess. -Electrolytes repleted.  -TSH 3.922.   -Status post IV albumin.   -Patient hydrated IV fluid and maintained on empiric antibiotics.   -Outpatient follow-up with PCP.   12.  Fever -Patient noted to have a T-max of 102.3 on 05/26/2021. -Could be secondary to valvular abscess. -Chest x-ray obtained unremarkable.  Blood cultures obtained with no growth to date by day of discharge.   -Patient was maintained on empiric IV antibiotics for valvular abscess which was being treated.   -Patient remained afebrile for 48 hours prior to discharge.   -Patient was discharged home on 3 more days of Augmentin and doxycycline to complete a 7-day course of antibiotic treatment.   -Outpatient follow-up.       Procedures: Chest x-ray 05/21/2021 2D echo 05/22/2021 CT pelvis 05/24/2021 Right upper quadrant ultrasound 05/24/2021 Incision and drainage of vulvar  abscess per GYN: Dr. Elgie Garcia 05/24/2021  Consultations: GYN: Dr. Elgie Garcia 05/24/2021  Discharge Exam: Vitals:   05/28/21 1045 05/28/21 1420  BP: 93/66 105/74  Pulse: 85 87  Resp: 14 20  Temp:  98.3 F (36.8 C)  SpO2: 98% 98%    General: NAD. Cardiovascular: Regular rate rhythm no murmurs rubs or gallops.  No JVD.  No lower extremity edema. Respiratory: Regular rate and rhythm no murmurs rubs or  gallops.  No JVD.  No lower extremity edema.  Discharge Instructions   Discharge Instructions     Diet Carb Modified   Complete by: As directed    Discharge wound care:   Complete by: As directed    As above   Increase activity slowly   Complete by: As directed       Allergies as of 05/28/2021   No Known Allergies      Medication List     STOP taking these medications    albuterol 108 (90 Base) MCG/ACT inhaler Commonly known as: VENTOLIN HFA   losartan 25 MG tablet Commonly known as: COZAAR       TAKE these medications    acetaminophen 325 MG tablet Commonly known as: TYLENOL Take 2 tablets (650 mg total) by mouth every 6 (six) hours as needed for mild pain (or Fever >/= 101).   amoxicillin-clavulanate 875-125 MG tablet Commonly known as: Augmentin Take 1 tablet by mouth 2 (two) times daily for 3 days.   Apidra SoloStar 100 UNIT/ML Solostar Pen Generic drug: insulin glulisine Inject 1-15 Units into the skin See admin instructions. Changes as per carb intake as needed high CBG   bethanechol 5 MG tablet Commonly known as: URECHOLINE Take 5 mg by mouth 3 (three) times daily.   buprenorphine 5 MCG/HR Ptwk Commonly known as: BUTRANS Place 1 patch onto the skin once a week.   cholecalciferol 25 MCG (1000 UNIT) tablet Commonly known as: VITAMIN D3 Take 1,000 Units by mouth daily.   Dexilant 60 MG capsule Generic drug: dexlansoprazole Take 60 mg by mouth daily.   doxycycline 100 MG tablet Commonly known as: VIBRA-TABS Take 1 tablet (100 mg total) by mouth every 12 (twelve) hours for 3 days.   famotidine 40 MG tablet Commonly known as: PEPCID Take 40 mg by mouth 2 (two) times daily.   fluconazole 150 MG tablet Commonly known as: DIFLUCAN Take 150 mg by mouth once.   gabapentin 300 MG capsule Commonly known as: NEURONTIN Take 1 capsule (300 mg total) by mouth 3 (three) times daily.   Lantus SoloStar 100 UNIT/ML Solostar Pen Generic drug: insulin  glargine Inject 22 Units into the skin daily. What changed:  how much to take when to take this   levalbuterol 45 MCG/ACT inhaler Commonly known as: XOPENEX HFA Inhale 1 puff into the lungs every 6 (six) hours as needed for wheezing.   magnesium oxide 400 (240 Mg) MG tablet Commonly known as: MAG-OX Take 1 tablet (400 mg total) by mouth 2 (two) times daily for 14 days.   mometasone-formoterol 100-5 MCG/ACT Aero Commonly known as: DULERA Inhale 2 puffs into the lungs 2 (two) times daily.   montelukast 10 MG tablet Commonly known as: SINGULAIR Take 10 mg by mouth at bedtime.   multivitamin with minerals tablet Take 1 tablet by mouth daily.   rosuvastatin 20 MG tablet Commonly known as: CRESTOR Take 20 mg by mouth daily.   sodium bicarbonate 650 MG tablet Take 1 tablet (650 mg total) by mouth 3 (  three) times daily for 7 days.               Discharge Care Instructions  (From admission, onward)           Start     Ordered   05/28/21 0000  Discharge wound care:       Comments: As above   05/28/21 1523           No Known Allergies  Follow-up [place] [facility]. Schedule an appointment as soon as possible for a visit in 3 week(s).   Specialty: Obstetrics and Gynecology Why: Hospital follow up for labial abscess I and D. Schedule 3 weeks after hospital discharge.  Anna Garcia or any MD Contact information: [place] [address] [phone]        Day, [name], MD. Schedule an appointment as soon as possible for a visit in 2 week(s).   Specialty: Internal Medicine Contact information: [place] [facility] [address] [phone]         Kristian Covey, MD. Schedule an appointment as soon as possible for a visit in 2 week(s).   Specialty: Internal Medicine Contact information: [place] [address] [phone]                   The results of significant diagnostics from this hospitalization (including imaging, microbiology, ancillary and laboratory) are listed below for reference.    Significant Diagnostic Studies: DG Chest 2 View  Result Date: 05/27/2021 CLINICAL DATA:  Shortness of breath, palpitations, fever EXAM: CHEST - 2 VIEW COMPARISON:  Chest radiograph done on 05/21/2021 CT chest done on 12/20 2012 FINDINGS: Cardiac size is within normal limits. There is interval appearance of linear densities in both lower lung fields. There is minimal blunting of lateral CP angles. There is no pneumothorax. IMPRESSION: There are linear densities in both lower lung fields suggesting subsegmental atelectasis. Small bilateral pleural effusions. Electronically Signed   By: Elmer Picker M.D.   On: 05/27/2021 12:20   CT PELVIS WO CONTRAST  Result Date: 05/24/2021 CLINICAL DATA:  Soft tissue infection/abscess suspected. EXAM: CT PELVIS WITHOUT CONTRAST TECHNIQUE: Multidetector CT imaging of the pelvis was performed following the standard protocol without intravenous contrast. RADIATION DOSE REDUCTION: This exam was performed according to the departmental dose-optimization program which includes automated exposure control, adjustment of the mA and/or kV according to patient size and/or use of iterative reconstruction technique. COMPARISON:  None. FINDINGS: Urinary Tract:  Urinary bladder appears within normal limits. Bowel: No evidence of bowel obstruction. No bowel wall edema visualized. Appendix is normal. Vascular/Lymphatic: No pathologically enlarged lymph nodes. No significant vascular abnormality seen. Reproductive: IUD in the uterus. No suspicious adnexal mass identified. Other: Small to moderate amount of free fluid in the pelvis. No focal fluid collections identified in the soft tissues. Musculoskeletal: No acute osseous abnormality identified. No suspicious bony lesions. IMPRESSION: 1. No acute process or  focal collection identified to suggest abscess, given limitations of noncontrast study. 2. Free fluid in the pelvis which is nonspecific, most likely physiologic. Electronically Signed   By: Ofilia Neas M.D.   On: 05/24/2021 14:52   DG Chest Port 1 View  Result Date: 05/21/2021 CLINICAL DATA:  Shortness of breath EXAM: PORTABLE CHEST 1 VIEW COMPARISON:  Chest x-ray dated July 06, 2020 FINDINGS: The heart size and mediastinal contours are within normal limits. Both lungs are  clear. The visualized skeletal structures are unremarkable. IMPRESSION: No active disease. Electronically Signed   By: Yetta Glassman M.D.   On: 05/21/2021 12:19   ECHOCARDIOGRAM COMPLETE  Result Date: 05/22/2021    ECHOCARDIOGRAM REPORT   Patient Name:   KENNEDY BRINES Date of Exam: 05/22/2021 Medical Rec #:  [id number]    Height:       64.0 in Accession #:    [id number]   Weight:       163.6 lb Date of Birth:  03-04-1981     BSA:          1.796 m Patient Age:    69 years     BP:           95/71 mmHg Patient Gender: F            HR:           96 bpm. Exam Location:  Inpatient Procedure: 2D Echo Indications:    Acute diastolic CHF  History:        Patient has no prior history of Echocardiogram examinations.                 Risk Factors:Diabetes and Hypertension.  Sonographer:    Arlyss Gandy Referring Phys: [place]  1. Left ventricular ejection fraction, by estimation, is 65 to 70%. The left ventricle has normal function. The left ventricle has no regional wall motion abnormalities. Left ventricular diastolic parameters were normal.  2. Right ventricular systolic function is normal. The right ventricular size is normal.  3. The mitral valve is normal in structure. No evidence of mitral valve regurgitation. No evidence of mitral stenosis.  4. The aortic valve is normal in structure. Aortic valve regurgitation is not visualized. No aortic stenosis is present.  5. The inferior vena cava is normal in size with  greater than 50% respiratory variability, suggesting right atrial pressure of 3 mmHg. FINDINGS  Left Ventricle: Left ventricular ejection fraction, by estimation, is 65 to 70%. The left ventricle has normal function. The left ventricle has no regional wall motion abnormalities. The left ventricular internal cavity size was normal in size. There is  no left ventricular hypertrophy. Left ventricular diastolic parameters were normal. Right Ventricle: The right ventricular size is normal. No increase in right ventricular wall thickness. Right ventricular systolic function is normal. Left Atrium: Left atrial size was normal in size. Right Atrium: Right atrial size was normal in size. Pericardium: There is no evidence of pericardial effusion. Mitral Valve: The mitral valve is normal in structure. No evidence of mitral valve regurgitation. No evidence of mitral valve stenosis. Tricuspid Valve: The tricuspid valve is normal in structure. Tricuspid valve regurgitation is not demonstrated. No evidence of tricuspid stenosis. Aortic Valve: The aortic valve is normal in structure. Aortic valve regurgitation is not visualized. No aortic stenosis is present. Aortic valve mean gradient measures 3.0 mmHg. Aortic valve peak gradient measures 4.8 mmHg. Aortic valve area, by VTI measures 2.52 cm. Pulmonic Valve: The pulmonic valve was normal in structure. Pulmonic valve regurgitation is not visualized. No evidence of pulmonic stenosis. Aorta: The aortic root is normal in size and structure. Venous: The inferior vena cava is normal in size with greater than 50% respiratory variability, suggesting right atrial pressure of 3 mmHg. IAS/Shunts: No atrial level shunt detected by color flow Doppler.  LEFT VENTRICLE PLAX 2D LVIDd:         3.20 cm   Diastology LVIDs:  2.20 cm   LV e' medial:    11.10 cm/s LV PW:         0.80 cm   LV E/e' medial:  6.9 LV IVS:        0.80 cm   LV e' lateral:   15.80 cm/s LVOT diam:     2.00 cm   LV E/e'  lateral: 4.9 LV SV:         51 LV SV Index:   28 LVOT Area:     3.14 cm  RIGHT VENTRICLE RV Basal diam:  2.60 cm RV Mid diam:    2.40 cm RV S prime:     11.10 cm/s TAPSE (M-mode): 1.6 cm LEFT ATRIUM             Index        RIGHT ATRIUM           Index LA diam:        2.60 cm 1.45 cm/m   RA Area:     12.10 cm LA Vol (A2C):   20.1 ml 11.19 ml/m  RA Volume:   26.40 ml  14.70 ml/m LA Vol (A4C):   27.3 ml 15.20 ml/m LA Biplane Vol: 24.5 ml 13.64 ml/m  AORTIC VALVE AV Area (Vmax):    2.54 cm AV Area (Vmean):   2.38 cm AV Area (VTI):     2.52 cm AV Vmax:           110.00 cm/s AV Vmean:          75.200 cm/s AV VTI:            0.202 m AV Peak Grad:      4.8 mmHg AV Mean Grad:      3.0 mmHg LVOT Vmax:         88.80 cm/s LVOT Vmean:        57.000 cm/s LVOT VTI:          0.162 m LVOT/AV VTI ratio: 0.80  AORTA Ao Root diam: 2.70 cm Ao Asc diam:  2.50 cm MITRAL VALVE MV Area (PHT): 4.21 cm    SHUNTS MV Decel Time: 180 msec    Systemic VTI:  0.16 m MV E velocity: 76.70 cm/s  Systemic Diam: 2.00 cm MV A velocity: 59.10 cm/s MV E/A ratio:  1.30 Candee Furbish MD Electronically signed by Candee Furbish MD Signature Date/Time: 05/22/2021/3:05:39 PM    Final    US Abdomen Limited RUQ (LIVER/GB)  Result Date: 05/24/2021 CLINICAL DATA:  Transaminitis EXAM: ULTRASOUND ABDOMEN LIMITED RIGHT UPPER QUADRANT COMPARISON:  Abdominal ultrasound dated December 23, 2019 FINDINGS: Gallbladder: No gallstones or wall thickening visualized. No sonographic Murphy sign noted by sonographer. Common bile duct: Diameter: 5 mm Liver: Hyperechoic hypoechoic mass of the right hepatic lobe measuring 2.5 x 2.3 x 3.8 cm. Heterogeneous echotexture with increased parenchymal echogenicity. Portal vein is patent on color Doppler imaging with normal direction of blood flow towards the liver. Other: None. IMPRESSION: 1. Indeterminate hyperechoic mass of the right hepatic lobe measuring 3.8 cm, possibly a hemangioma. Recommend contrast-enhanced liver MRI for  further evaluation which can be performed non emergently. 2. Hepatic steatosis. Electronically Signed   By: Yetta Glassman M.D.   On: 05/24/2021 08:17    Microbiology: Recent Results (from the past 240 hour(s))  Resp Panel by RT-PCR (Flu A&B, Covid) Nasopharyngeal Swab     Status: None   Collection Time: 05/21/21 11:41 AM   Specimen: Nasopharyngeal Swab; Nasopharyngeal(NP) swabs in vial  transport medium  Result Value Ref Range Status   SARS Coronavirus 2 by RT PCR NEGATIVE NEGATIVE Final    Comment: (NOTE) SARS-CoV-2 target nucleic acids are NOT DETECTED.  The SARS-CoV-2 RNA is generally detectable in upper respiratory specimens during the acute phase of infection. The lowest concentration of SARS-CoV-2 viral copies this assay can detect is 138 copies/mL. A negative result does not preclude SARS-Cov-2 infection and should not be used as the sole basis for treatment or other patient management decisions. A negative result may occur with  improper specimen collection/handling, submission of specimen other than nasopharyngeal swab, presence of viral mutation(s) within the areas targeted by this assay, and inadequate number of viral copies(<138 copies/mL). A negative result must be combined with clinical observations, patient history, and epidemiological information. The expected result is Negative.  Fact Sheet for Patients:  EntrepreneurPulse.com.au  Fact Sheet for Healthcare Providers:  IncredibleEmployment.be  This test is no t yet approved or cleared by the [place] FDA and  has been authorized for detection and/or diagnosis of SARS-CoV-2 by FDA under an Emergency Use Authorization (EUA). This EUA will remain  in effect (meaning this test can be used) for the duration of the COVID-19 declaration under Section 564(b)(1) of the Act, 21 U.S.C.section 360bbb-3(b)(1), unless the authorization is terminated  or revoked sooner.        Influenza A by PCR NEGATIVE NEGATIVE Final   Influenza B by PCR NEGATIVE NEGATIVE Final    Comment: (NOTE) The Xpert Xpress SARS-CoV-2/FLU/RSV plus assay is intended as an aid in the diagnosis of influenza from Nasopharyngeal swab specimens and should not be used as a sole basis for treatment. Nasal washings and aspirates are unacceptable for Xpert Xpress SARS-CoV-2/FLU/RSV testing.  Fact Sheet for Patients: EntrepreneurPulse.com.au  Fact Sheet for Healthcare Providers: IncredibleEmployment.be  This test is not yet approved or cleared by the [place] FDA and has been authorized for detection and/or diagnosis of SARS-CoV-2 by FDA under an Emergency Use Authorization (EUA). This EUA will remain in effect (meaning this test can be used) for the duration of the COVID-19 declaration under Section 564(b)(1) of the Act, 21 U.S.C. section 360bbb-3(b)(1), unless the authorization is terminated or revoked.  Performed at [facility], [place] [address] [place], [place] [address]   MRSA Next Gen by PCR, Nasal     Status: None   Collection Time: 05/21/21  5:08 PM   Specimen: Nasal Mucosa; Nasal Swab  Result Value Ref Range Status   MRSA by PCR Next Gen NOT DETECTED NOT DETECTED Final    Comment: (NOTE) The GeneXpert MRSA Assay (FDA approved for NASAL specimens only), is one component of a comprehensive MRSA colonization surveillance program. It is not intended to diagnose MRSA infection nor to guide or monitor treatment for MRSA infections. Test performance is not FDA approved in patients less than 54 years old. Performed at [facility], [place] [address], [place], [place] [address]   Aerobic Culture w Gram Stain (superficial specimen)     Status: None   Collection Time: 05/24/21  6:22 PM   Specimen: Labia  Result Value Ref Range Status   Specimen Description   Final    LABIA Performed at [place] [address] [place], [place] [address]    Special Requests   Final    NONE Performed at [facility], [place] [address], [place], [place] [address]    Gram Stain   Final  FEW WBC PRESENT, PREDOMINANTLY MONONUCLEAR FEW GRAM POSITIVE RODS RARE GRAM POSITIVE COCCI RARE GRAM NEGATIVE RODS    Culture   Final    FEW LACTOBACILLUS SPECIES Standardized susceptibility testing for this organism is not available. Performed at [facility], [place] [address], [place], [place] [address]    Report Status 05/27/2021 FINAL  Final  Urine Culture     Status: None   Collection Time: 05/27/21  7:58 AM   Specimen: Urine, Catheterized  Result Value Ref Range Status   Specimen Description   Final    URINE, CATHETERIZED Performed at [facility], [place] [address] [place], [place] [address]    Special Requests   Final    NONE Performed at [facility], [place] [address], [place], [place] [address]    Culture   Final    NO GROWTH Performed at [facility], [place] [address] [address], [place], [place] [address]    Report Status 05/28/2021 FINAL  Final  Culture, blood (Routine X 2) w Reflex to ID Panel     Status: None (Preliminary result)   Collection Time: 05/27/21  8:37 AM   Specimen: Right Antecubital; Blood  Result Value Ref Range Status   Specimen Description   Final    RIGHT ANTECUBITAL Performed at [facility] [address] [place], [place] [address]    Special Requests   Final    BOTTLES DRAWN AEROBIC AND ANAEROBIC Blood Culture adequate volume Performed at [facility] [address] [place], [place] [address]    Culture   Final    NO GROWTH < 24 HOURS Performed at [facility] [address] [place], [place] [address]    Report Status PENDING  Incomplete  Culture, blood (Routine X 2) w Reflex to ID Panel     Status: None (Preliminary result)   Collection Time:  05/27/21  8:37 AM   Specimen: Left Antecubital; Blood  Result Value Ref Range Status   Specimen Description   Final    LEFT ANTECUBITAL Performed at [facility] [address] [place], [place] [address]    Special Requests   Final    BOTTLES DRAWN AEROBIC AND ANAEROBIC Blood Culture adequate volume Performed at [facility] [address], [place], [place] [address]    Culture   Final    NO GROWTH < 24 HOURS Performed at [facility] [address] [place],  [address]    Report Status PENDING  Incomplete     Labs: Basic Metabolic Panel: Recent Labs  Lab 05/22/21 0934 05/23/21 0421 05/24/21 0427 05/25/21 0425 05/26/21 0534 05/27/21 0447 05/28/21 0423  NA 137 138 135 136 133* 138 137  K 5.7* 3.2* 2.9* 5.0 3.2* 3.8 3.9  CL 114* 114* 110 107 103 109 108  CO2 14* 16* 18* 14* 20* 15* 19*  GLUCOSE 230* 231* 310* 343* 226* 223* 445*  BUN 15 10 10 11 10 9 8   CREATININE 1.93* 1.71* 1.58* 1.34* 1.57* 1.37* 1.34*  CALCIUM 8.8* 8.3* 8.0* 8.4* 7.8* 8.4* 8.3*  MG 2.2 2.0  --  1.2* 2.2  --  1.4*   Liver Function Tests: Recent Labs  Lab 05/22/21 0517 05/23/21 0421 05/24/21 0427 05/26/21 0534  AST 165* 226* 129* 122*  ALT 90* 103* 89* 71*  ALKPHOS 213* 200* 194* 174*  BILITOT 0.9 0.6 0.7 0.3  PROT 6.1* 5.7* 5.8* 5.8*  ALBUMIN  2.8* 2.6* 2.7* 3.2*   No results for input(s): LIPASE, AMYLASE in the last 168 hours. No results for input(s): AMMONIA in the last 168 hours. CBC: Recent Labs  Lab 05/23/21 0421 05/24/21 0427 05/25/21 0425 05/26/21 0534 05/28/21 0423  WBC 5.4 6.4 4.7 3.3* 4.1  NEUTROABS  --  3.2 3.7 1.6* 1.8  HGB 9.8* 9.6* 10.1* 8.1* 8.7*  HCT 29.3* 29.1* 30.2* 25.2* 26.7*  MCV 106.9* 109.0* 106.3* 108.6* 108.5*  PLT 131* 137* 123* 126* 124*   Cardiac Enzymes: No results for input(s): CKTOTAL, CKMB, CKMBINDEX, TROPONINI in the last 168 hours. BNP: BNP (last 3 results) Recent Labs    05/21/21 1110   BNP 297.4*    ProBNP (last 3 results) No results for input(s): PROBNP in the last 8760 hours.  CBG: Recent Labs  Lab 05/27/21 1617 05/27/21 2222 05/28/21 0301 05/28/21 0727 05/28/21 1205  GLUCAP 301* 316* 383* 339* 267*       Signed:  Irine Seal MD.  [facility] 05/28/2021, 3:51 PM

## 2021-05-28 NOTE — Care Management Important Message (Signed)
Important Message  Patient Details IM Letter given to the Patient. Name: DEMARA LOVER MRN: [id number] Date of Birth: 07-31-1980   Medicare Important Message Given:  Yes     Kerin Salen 05/28/2021, 3:36 PM

## 2021-05-28 NOTE — Plan of Care (Signed)

## 2021-06-01 LAB — CULTURE, BLOOD (ROUTINE X 2)
Culture: NO GROWTH
Culture: NO GROWTH
Special Requests: ADEQUATE
Special Requests: ADEQUATE

## 2021-06-19 ENCOUNTER — Encounter (HOSPITAL_COMMUNITY): Payer: Self-pay | Admitting: Radiology

## 2021-09-22 ENCOUNTER — Inpatient Hospital Stay (HOSPITAL_COMMUNITY)
Admission: EM | Admit: 2021-09-22 | Discharge: 2021-09-26 | DRG: 638 | Disposition: A | Discharge status: Home / Self Care | Payer: Medicare Other | Attending: Internal Medicine | Admitting: Internal Medicine

## 2021-09-22 ENCOUNTER — Emergency Department (HOSPITAL_COMMUNITY): Payer: Medicare Other

## 2021-09-22 ENCOUNTER — Other Ambulatory Visit: Payer: Self-pay

## 2021-09-22 ENCOUNTER — Encounter (HOSPITAL_COMMUNITY): Payer: Self-pay

## 2021-09-22 DIAGNOSIS — D1803 Hemangioma of intra-abdominal structures: Secondary | ICD-10-CM | POA: Diagnosis present

## 2021-09-22 DIAGNOSIS — R7401 Elevation of levels of liver transaminase levels: Secondary | ICD-10-CM | POA: Diagnosis present

## 2021-09-22 DIAGNOSIS — J4521 Mild intermittent asthma with (acute) exacerbation: Secondary | ICD-10-CM | POA: Diagnosis not present

## 2021-09-22 DIAGNOSIS — E1010 Type 1 diabetes mellitus with ketoacidosis without coma: Principal | ICD-10-CM

## 2021-09-22 DIAGNOSIS — Z7951 Long term (current) use of inhaled steroids: Secondary | ICD-10-CM

## 2021-09-22 DIAGNOSIS — N1831 Chronic kidney disease, stage 3a: Secondary | ICD-10-CM

## 2021-09-22 DIAGNOSIS — R1314 Dysphagia, pharyngoesophageal phase: Secondary | ICD-10-CM | POA: Diagnosis present

## 2021-09-22 DIAGNOSIS — D631 Anemia in chronic kidney disease: Secondary | ICD-10-CM | POA: Diagnosis present

## 2021-09-22 DIAGNOSIS — I129 Hypertensive chronic kidney disease with stage 1 through stage 4 chronic kidney disease, or unspecified chronic kidney disease: Secondary | ICD-10-CM | POA: Diagnosis present

## 2021-09-22 DIAGNOSIS — R6510 Systemic inflammatory response syndrome (SIRS) of non-infectious origin without acute organ dysfunction: Secondary | ICD-10-CM

## 2021-09-22 DIAGNOSIS — E0810 Diabetes mellitus due to underlying condition with ketoacidosis without coma: Principal | ICD-10-CM

## 2021-09-22 DIAGNOSIS — E1110 Type 2 diabetes mellitus with ketoacidosis without coma: Secondary | ICD-10-CM | POA: Diagnosis not present

## 2021-09-22 DIAGNOSIS — Z8249 Family history of ischemic heart disease and other diseases of the circulatory system: Secondary | ICD-10-CM

## 2021-09-22 DIAGNOSIS — E1043 Type 1 diabetes mellitus with diabetic autonomic (poly)neuropathy: Secondary | ICD-10-CM | POA: Diagnosis present

## 2021-09-22 DIAGNOSIS — K219 Gastro-esophageal reflux disease without esophagitis: Secondary | ICD-10-CM | POA: Diagnosis present

## 2021-09-22 DIAGNOSIS — I10 Essential (primary) hypertension: Secondary | ICD-10-CM | POA: Diagnosis present

## 2021-09-22 DIAGNOSIS — E861 Hypovolemia: Secondary | ICD-10-CM | POA: Diagnosis present

## 2021-09-22 DIAGNOSIS — Z87891 Personal history of nicotine dependence: Secondary | ICD-10-CM

## 2021-09-22 DIAGNOSIS — E663 Overweight: Secondary | ICD-10-CM | POA: Diagnosis present

## 2021-09-22 DIAGNOSIS — K529 Noninfective gastroenteritis and colitis, unspecified: Secondary | ICD-10-CM | POA: Diagnosis present

## 2021-09-22 DIAGNOSIS — D539 Nutritional anemia, unspecified: Secondary | ICD-10-CM | POA: Diagnosis present

## 2021-09-22 DIAGNOSIS — K222 Esophageal obstruction: Secondary | ICD-10-CM | POA: Diagnosis present

## 2021-09-22 DIAGNOSIS — E1042 Type 1 diabetes mellitus with diabetic polyneuropathy: Secondary | ICD-10-CM | POA: Diagnosis present

## 2021-09-22 DIAGNOSIS — K449 Diaphragmatic hernia without obstruction or gangrene: Secondary | ICD-10-CM | POA: Diagnosis present

## 2021-09-22 DIAGNOSIS — K3184 Gastroparesis: Secondary | ICD-10-CM

## 2021-09-22 DIAGNOSIS — E876 Hypokalemia: Secondary | ICD-10-CM | POA: Diagnosis present

## 2021-09-22 DIAGNOSIS — Z794 Long term (current) use of insulin: Secondary | ICD-10-CM

## 2021-09-22 DIAGNOSIS — J45909 Unspecified asthma, uncomplicated: Secondary | ICD-10-CM | POA: Diagnosis present

## 2021-09-22 DIAGNOSIS — N179 Acute kidney failure, unspecified: Secondary | ICD-10-CM

## 2021-09-22 DIAGNOSIS — K760 Fatty (change of) liver, not elsewhere classified: Secondary | ICD-10-CM | POA: Diagnosis present

## 2021-09-22 DIAGNOSIS — Z6828 Body mass index (BMI) 28.0-28.9, adult: Secondary | ICD-10-CM

## 2021-09-22 DIAGNOSIS — Z79899 Other long term (current) drug therapy: Secondary | ICD-10-CM

## 2021-09-22 DIAGNOSIS — R21 Rash and other nonspecific skin eruption: Secondary | ICD-10-CM | POA: Diagnosis not present

## 2021-09-22 DIAGNOSIS — E860 Dehydration: Secondary | ICD-10-CM | POA: Diagnosis present

## 2021-09-22 DIAGNOSIS — D696 Thrombocytopenia, unspecified: Secondary | ICD-10-CM | POA: Diagnosis present

## 2021-09-22 DIAGNOSIS — E1343 Other specified diabetes mellitus with diabetic autonomic (poly)neuropathy: Secondary | ICD-10-CM | POA: Diagnosis present

## 2021-09-22 DIAGNOSIS — E1022 Type 1 diabetes mellitus with diabetic chronic kidney disease: Secondary | ICD-10-CM | POA: Diagnosis present

## 2021-09-22 DIAGNOSIS — I959 Hypotension, unspecified: Secondary | ICD-10-CM | POA: Diagnosis present

## 2021-09-22 LAB — URINALYSIS, ROUTINE W REFLEX MICROSCOPIC
Bilirubin Urine: NEGATIVE
Glucose, UA: >=500 mg/dL — AB
Ketones, ur: 20 mg/dL — AB
Leukocytes,Ua: NEGATIVE
Nitrite: NEGATIVE
Protein, ur: 30 mg/dL — AB
Specific Gravity, Urine: 1.002 — ABNORMAL LOW (ref 1.005–1.030)
pH: 6 (ref 5.0–8.0)

## 2021-09-22 LAB — BASIC METABOLIC PANEL
Anion gap: 20 — ABNORMAL HIGH (ref 5–15)
Anion gap: 9 (ref 5–15)
Anion gap: 7 (ref 5–15)
BUN: 27 mg/dL — ABNORMAL HIGH (ref 6–20)
BUN: 24 mg/dL — ABNORMAL HIGH (ref 6–20)
BUN: 21 mg/dL — ABNORMAL HIGH (ref 6–20)
BUN: 19 mg/dL (ref 6–20)
CO2: <7 mmol/L — ABNORMAL LOW (ref 22–32)
CO2: 8 mmol/L — ABNORMAL LOW (ref 22–32)
CO2: 14 mmol/L — ABNORMAL LOW (ref 22–32)
CO2: 17 mmol/L — ABNORMAL LOW (ref 22–32)
Calcium: 8.9 mg/dL (ref 8.9–10.3)
Calcium: 8.4 mg/dL — ABNORMAL LOW (ref 8.9–10.3)
Calcium: 8 mg/dL — ABNORMAL LOW (ref 8.9–10.3)
Calcium: 7.8 mg/dL — ABNORMAL LOW (ref 8.9–10.3)
Chloride: 104 mmol/L (ref 98–111)
Chloride: 114 mmol/L — ABNORMAL HIGH (ref 98–111)
Chloride: 118 mmol/L — ABNORMAL HIGH (ref 98–111)
Chloride: 120 mmol/L — ABNORMAL HIGH (ref 98–111)
Creatinine, Ser: 2.64 mg/dL — ABNORMAL HIGH (ref 0.44–1.00)
Creatinine, Ser: 2.16 mg/dL — ABNORMAL HIGH (ref 0.44–1.00)
Creatinine, Ser: 2.02 mg/dL — ABNORMAL HIGH (ref 0.44–1.00)
Creatinine, Ser: 1.94 mg/dL — ABNORMAL HIGH (ref 0.44–1.00)
GFR, Estimated: 23 mL/min — ABNORMAL LOW (ref >60)
GFR, Estimated: 29 mL/min — ABNORMAL LOW (ref >60)
GFR, Estimated: 31 mL/min — ABNORMAL LOW (ref >60)
GFR, Estimated: 33 mL/min — ABNORMAL LOW (ref >60)
Glucose, Bld: 352 mg/dL — ABNORMAL HIGH (ref 70–99)
Glucose, Bld: 179 mg/dL — ABNORMAL HIGH (ref 70–99)
Glucose, Bld: 167 mg/dL — ABNORMAL HIGH (ref 70–99)
Glucose, Bld: 118 mg/dL — ABNORMAL HIGH (ref 70–99)
Potassium: 3.5 mmol/L (ref 3.5–5.1)
Potassium: 3.3 mmol/L — ABNORMAL LOW (ref 3.5–5.1)
Potassium: 3 mmol/L — ABNORMAL LOW (ref 3.5–5.1)
Potassium: 2.8 mmol/L — ABNORMAL LOW (ref 3.5–5.1)
Sodium: 133 mmol/L — ABNORMAL LOW (ref 135–145)
Sodium: 142 mmol/L (ref 135–145)
Sodium: 141 mmol/L (ref 135–145)
Sodium: 144 mmol/L (ref 135–145)

## 2021-09-22 LAB — BLOOD GAS, VENOUS
Acid-base deficit: 27.7 mmol/L — ABNORMAL HIGH (ref 0.0–2.0)
Bicarbonate: 4.7 mmol/L — ABNORMAL LOW (ref 20.0–28.0)
O2 Saturation: 40.2 %
Patient temperature: 37
pCO2, Ven: 25 mmHg — ABNORMAL LOW (ref 44–60)
pH, Ven: <6.95 — CL (ref 7.25–7.43)
pO2, Ven: 32 mmHg (ref 32–45)

## 2021-09-22 LAB — GLUCOSE, CAPILLARY
Glucose-Capillary: 191 mg/dL — ABNORMAL HIGH (ref 70–99)
Glucose-Capillary: 213 mg/dL — ABNORMAL HIGH (ref 70–99)
Glucose-Capillary: 193 mg/dL — ABNORMAL HIGH (ref 70–99)
Glucose-Capillary: 165 mg/dL — ABNORMAL HIGH (ref 70–99)
Glucose-Capillary: 170 mg/dL — ABNORMAL HIGH (ref 70–99)
Glucose-Capillary: 169 mg/dL — ABNORMAL HIGH (ref 70–99)
Glucose-Capillary: 129 mg/dL — ABNORMAL HIGH (ref 70–99)
Glucose-Capillary: 109 mg/dL — ABNORMAL HIGH (ref 70–99)
Glucose-Capillary: 109 mg/dL — ABNORMAL HIGH (ref 70–99)
Glucose-Capillary: 106 mg/dL — ABNORMAL HIGH (ref 70–99)

## 2021-09-22 LAB — CBC WITH DIFFERENTIAL/PLATELET
Abs Immature Granulocytes: 0.12 10*3/uL — ABNORMAL HIGH (ref 0.00–0.07)
Basophils Absolute: 0.2 10*3/uL — ABNORMAL HIGH (ref 0.0–0.1)
Basophils Relative: 1 %
Eosinophils Absolute: 0.6 10*3/uL — ABNORMAL HIGH (ref 0.0–0.5)
Eosinophils Relative: 4 %
HCT: 30.9 % — ABNORMAL LOW (ref 36.0–46.0)
Hemoglobin: 9.3 g/dL — ABNORMAL LOW (ref 12.0–15.0)
Immature Granulocytes: 1 %
Lymphocytes Relative: 40 %
Lymphs Abs: 5.8 10*3/uL — ABNORMAL HIGH (ref 0.7–4.0)
MCH: 35.5 pg — ABNORMAL HIGH (ref 26.0–34.0)
MCHC: 30.1 g/dL (ref 30.0–36.0)
MCV: 117.9 fL — ABNORMAL HIGH (ref 80.0–100.0)
Monocytes Absolute: 1 10*3/uL (ref 0.1–1.0)
Monocytes Relative: 7 %
Neutro Abs: 6.9 10*3/uL (ref 1.7–7.7)
Neutrophils Relative %: 47 %
Platelets: 232 10*3/uL (ref 150–400)
RBC: 2.62 MIL/uL — ABNORMAL LOW (ref 3.87–5.11)
RDW: 16.1 % — ABNORMAL HIGH (ref 11.5–15.5)
WBC: 14.6 10*3/uL — ABNORMAL HIGH (ref 4.0–10.5)
nRBC: 0 % (ref 0.0–0.2)

## 2021-09-22 LAB — I-STAT BETA HCG BLOOD, ED (MC, WL, AP ONLY): I-stat hCG, quantitative: <5 m[IU]/mL (ref <5)

## 2021-09-22 LAB — I-STAT CHEM 8, ED
BUN: 23 mg/dL — ABNORMAL HIGH (ref 6–20)
Calcium, Ion: 1.24 mmol/L (ref 1.15–1.40)
Chloride: 109 mmol/L (ref 98–111)
Creatinine, Ser: 2.7 mg/dL — ABNORMAL HIGH (ref 0.44–1.00)
Glucose, Bld: 358 mg/dL — ABNORMAL HIGH (ref 70–99)
HCT: 32 % — ABNORMAL LOW (ref 36.0–46.0)
Hemoglobin: 10.9 g/dL — ABNORMAL LOW (ref 12.0–15.0)
Potassium: 3.6 mmol/L (ref 3.5–5.1)
Sodium: 134 mmol/L — ABNORMAL LOW (ref 135–145)
TCO2: 6 mmol/L — ABNORMAL LOW (ref 22–32)

## 2021-09-22 LAB — CBG MONITORING, ED
Glucose-Capillary: 349 mg/dL — ABNORMAL HIGH (ref 70–99)
Glucose-Capillary: 351 mg/dL — ABNORMAL HIGH (ref 70–99)
Glucose-Capillary: 283 mg/dL — ABNORMAL HIGH (ref 70–99)
Glucose-Capillary: 194 mg/dL — ABNORMAL HIGH (ref 70–99)

## 2021-09-22 LAB — COMPREHENSIVE METABOLIC PANEL
ALT: 83 U/L — ABNORMAL HIGH (ref 0–44)
AST: 123 U/L — ABNORMAL HIGH (ref 15–41)
Albumin: 3.9 g/dL (ref 3.5–5.0)
Alkaline Phosphatase: 145 U/L — ABNORMAL HIGH (ref 38–126)
BUN: 27 mg/dL — ABNORMAL HIGH (ref 6–20)
CO2: <7 mmol/L — ABNORMAL LOW (ref 22–32)
Calcium: 9 mg/dL (ref 8.9–10.3)
Chloride: 106 mmol/L (ref 98–111)
Creatinine, Ser: 2.53 mg/dL — ABNORMAL HIGH (ref 0.44–1.00)
GFR, Estimated: 24 mL/min — ABNORMAL LOW (ref >60)
Glucose, Bld: 355 mg/dL — ABNORMAL HIGH (ref 70–99)
Potassium: 3.5 mmol/L (ref 3.5–5.1)
Sodium: 136 mmol/L (ref 135–145)
Total Bilirubin: 1.6 mg/dL — ABNORMAL HIGH (ref 0.3–1.2)
Total Protein: 7.3 g/dL (ref 6.5–8.1)

## 2021-09-22 LAB — BETA-HYDROXYBUTYRIC ACID
Beta-Hydroxybutyric Acid: >8 mmol/L — ABNORMAL HIGH (ref 0.05–0.27)
Beta-Hydroxybutyric Acid: 1.92 mmol/L — ABNORMAL HIGH (ref 0.05–0.27)

## 2021-09-22 MED ORDER — POTASSIUM CHLORIDE IN NACL 40-0.9 MEQ/L-% IV SOLN
INTRAVENOUS | Status: AC
  Filled 2021-09-22: qty 1000

## 2021-09-22 MED ORDER — INSULIN GLARGINE-YFGN 100 UNIT/ML ~~LOC~~ SOLN
12.0000 [IU] | Freq: Every day | SUBCUTANEOUS | Status: DC
  Administered 2021-09-22: 12 [IU] via SUBCUTANEOUS
  Filled 2021-09-22 (×2): qty 0.12

## 2021-09-22 MED ORDER — METOCLOPRAMIDE HCL 5 MG/ML IJ SOLN
5.0000 mg | Freq: Four times a day (QID) | INTRAMUSCULAR | Status: DC
  Administered 2021-09-22 – 2021-09-26 (×16): 5 mg via INTRAVENOUS
  Filled 2021-09-22 (×15): qty 2

## 2021-09-22 MED ORDER — GABAPENTIN 300 MG PO CAPS
300.0000 mg | ORAL_CAPSULE | Freq: Three times a day (TID) | ORAL | Status: DC
  Administered 2021-09-22 – 2021-09-26 (×10): 300 mg via ORAL
  Filled 2021-09-22 (×9): qty 1

## 2021-09-22 MED ORDER — POTASSIUM CHLORIDE 10 MEQ/100ML IV SOLN
10.0000 meq | INTRAVENOUS | Status: AC
  Administered 2021-09-22 (×2): 10 meq via INTRAVENOUS
  Filled 2021-09-22 (×2): qty 100

## 2021-09-22 MED ORDER — LACTATED RINGERS IV SOLN
INTRAVENOUS | Status: DC
Start: 2021-09-22 — End: 2021-09-23

## 2021-09-22 MED ORDER — LACTATED RINGERS IV BOLUS
1000.0000 mL | Freq: Once | INTRAVENOUS | Status: AC | PRN
  Administered 2021-09-22: 1000 mL via INTRAVENOUS

## 2021-09-22 MED ORDER — LEVALBUTEROL HCL 1.25 MG/0.5ML IN NEBU: 1.2500 mg | INHALATION_SOLUTION | Freq: Four times a day (QID) | RESPIRATORY_TRACT | Status: DC | PRN

## 2021-09-22 MED ORDER — DEXTROSE 50 % IV SOLN: 0.0000 mL | INTRAVENOUS | Status: DC | PRN

## 2021-09-22 MED ORDER — CEFAZOLIN SODIUM-DEXTROSE 2-4 GM/100ML-% IV SOLN
INTRAVENOUS | Status: AC
  Filled 2021-09-22: qty 100

## 2021-09-22 MED ORDER — SODIUM CHLORIDE 0.9 % IV BOLUS (SEPSIS)
2000.0000 mL | Freq: Once | INTRAVENOUS | Status: AC
  Administered 2021-09-22: 2000 mL via INTRAVENOUS

## 2021-09-22 MED ORDER — LACTATED RINGERS IV SOLN: INTRAVENOUS | Status: DC

## 2021-09-22 MED ORDER — MIDODRINE HCL 5 MG PO TABS
10.0000 mg | ORAL_TABLET | Freq: Once | ORAL | Status: AC
Start: 2021-09-22 — End: 2021-09-22
  Administered 2021-09-22: 10 mg via ORAL
  Filled 2021-09-22: qty 2

## 2021-09-22 MED ORDER — MOMETASONE FURO-FORMOTEROL FUM 100-5 MCG/ACT IN AERO
2.0000 | INHALATION_SPRAY | Freq: Two times a day (BID) | RESPIRATORY_TRACT | Status: DC
Start: 2021-09-22 — End: 2021-09-26
  Administered 2021-09-22 – 2021-09-26 (×7): 2 via RESPIRATORY_TRACT
  Filled 2021-09-22: qty 8.8

## 2021-09-22 MED ORDER — IPRATROPIUM-ALBUTEROL 0.5-2.5 (3) MG/3ML IN SOLN
3.0000 mL | RESPIRATORY_TRACT | Status: DC | PRN
Start: 2021-09-22 — End: 2021-09-26

## 2021-09-22 MED ORDER — SODIUM BICARBONATE 8.4 % IV SOLN
50.0000 meq | Freq: Once | INTRAVENOUS | Status: AC
Start: 2021-09-22 — End: 2021-09-22
  Administered 2021-09-22: 50 meq via INTRAVENOUS
  Filled 2021-09-22: qty 50

## 2021-09-22 MED ORDER — METOCLOPRAMIDE HCL 5 MG/ML IJ SOLN
10.0000 mg | Freq: Once | INTRAMUSCULAR | Status: AC
  Administered 2021-09-22: 10 mg via INTRAVENOUS
  Filled 2021-09-22: qty 2

## 2021-09-22 MED ORDER — INSULIN REGULAR(HUMAN) IN NACL 100-0.9 UT/100ML-% IV SOLN
INTRAVENOUS | Status: DC
  Administered 2021-09-22: 6.5 [IU]/h via INTRAVENOUS
  Filled 2021-09-22: qty 100

## 2021-09-22 MED ORDER — POTASSIUM CHLORIDE 10 MEQ/100ML IV SOLN
10.0000 meq | INTRAVENOUS | Status: AC
  Administered 2021-09-22 (×4): 10 meq via INTRAVENOUS
  Filled 2021-09-22 (×4): qty 100

## 2021-09-22 MED ORDER — SODIUM CHLORIDE 0.9 % IV SOLN: 250.0000 mL | INTRAVENOUS | Status: DC

## 2021-09-22 MED ORDER — DEXTROSE IN LACTATED RINGERS 5 % IV SOLN: INTRAVENOUS | Status: DC

## 2021-09-22 MED ORDER — CHLORHEXIDINE GLUCONATE CLOTH 2 % EX PADS
6.0000 | MEDICATED_PAD | Freq: Every day | CUTANEOUS | Status: DC
  Administered 2021-09-22 – 2021-09-23 (×2): 6 via TOPICAL

## 2021-09-22 MED ORDER — LACTATED RINGERS IV BOLUS
1000.0000 mL | Freq: Once | INTRAVENOUS | Status: AC
  Administered 2021-09-22: 1000 mL via INTRAVENOUS

## 2021-09-22 MED ORDER — INSULIN ASPART 100 UNIT/ML IJ SOLN
0.0000 [IU] | Freq: Three times a day (TID) | INTRAMUSCULAR | Status: DC
  Administered 2021-09-23 (×2): 5 [IU] via SUBCUTANEOUS

## 2021-09-22 MED ORDER — LEVALBUTEROL HCL 1.25 MG/0.5ML IN NEBU: 1.2500 mg | INHALATION_SOLUTION | Freq: Once | RESPIRATORY_TRACT | Status: DC

## 2021-09-22 MED ORDER — MONTELUKAST SODIUM 10 MG PO TABS
10.0000 mg | ORAL_TABLET | Freq: Every day | ORAL | Status: DC
  Administered 2021-09-22 – 2021-09-25 (×4): 10 mg via ORAL
  Filled 2021-09-22 (×4): qty 1

## 2021-09-22 MED ORDER — INSULIN REGULAR(HUMAN) IN NACL 100-0.9 UT/100ML-% IV SOLN: INTRAVENOUS | Status: DC

## 2021-09-22 MED ORDER — ACETAMINOPHEN 325 MG PO TABS
650.0000 mg | ORAL_TABLET | Freq: Four times a day (QID) | ORAL | Status: DC | PRN
  Administered 2021-09-23: 650 mg via ORAL
  Filled 2021-09-22: qty 2

## 2021-09-22 MED ORDER — STERILE WATER FOR INJECTION IV SOLN
Freq: Once | INTRAVENOUS | Status: AC
  Filled 2021-09-22: qty 150

## 2021-09-22 MED ORDER — LACTATED RINGERS IV BOLUS: 20.0000 mL/kg | Freq: Once | INTRAVENOUS | Status: DC

## 2021-09-22 MED ORDER — FAMOTIDINE 20 MG PO TABS
20.0000 mg | ORAL_TABLET | Freq: Two times a day (BID) | ORAL | Status: DC
  Administered 2021-09-22 – 2021-09-23 (×3): 20 mg via ORAL
  Filled 2021-09-22 (×3): qty 1

## 2021-09-22 MED ORDER — SODIUM CHLORIDE 0.9 % IV BOLUS
2000.0000 mL | Freq: Once | INTRAVENOUS | Status: AC
Start: 2021-09-22 — End: 2021-09-22
  Administered 2021-09-22: 2000 mL via INTRAVENOUS

## 2021-09-22 MED ORDER — PROCHLORPERAZINE EDISYLATE 10 MG/2ML IJ SOLN: 5.0000 mg | INTRAMUSCULAR | Status: DC | PRN

## 2021-09-22 MED ORDER — PANTOPRAZOLE SODIUM 40 MG PO TBEC
40.0000 mg | DELAYED_RELEASE_TABLET | Freq: Every day | ORAL | Status: DC
  Administered 2021-09-22: 40 mg via ORAL
  Filled 2021-09-22: qty 1

## 2021-09-22 MED ORDER — ACETAMINOPHEN 650 MG RE SUPP: 650.0000 mg | Freq: Four times a day (QID) | RECTAL | Status: DC | PRN

## 2021-09-22 MED ORDER — PHENYLEPHRINE HCL-NACL 20-0.9 MG/250ML-% IV SOLN: 25.0000 ug/min | INTRAVENOUS | Status: DC

## 2021-09-22 NOTE — ED Triage Notes (Signed)
Pt to ED by EMS from home with c/o hyperglycemia, current CBG is 332. Pt has a history of DKA and feels that she is currently in DKA, also Endorses bilateral shoulder pain. Arrives A+O, tachypnic, all other VSS.

## 2021-09-22 NOTE — Progress Notes (Signed)
An USGPIV (ultrasound guided PIV) has been placed for short-term vasopressor infusion. A correctly placed ivWatch must be used when administering Vasopressors. Should this treatment be needed beyond 72 hours, central line access should be obtained.  It will be the responsibility of the bedside nurse to follow best practice to prevent extravasations.   ?

## 2021-09-22 NOTE — Progress Notes (Signed)
[facility] admitting physician addendum:  The patient was seen due to blood pressures in the 70s/40s..  Denied lightheadedness, chest pain or palpitations.  She is no longer tachycardic.  She is off the bicarb infusion.  Her bicarbonate and anion gap have improved.  LR 1000 mL bolus ordered, followed by another 1000 mL LR bolus if SBP remains below 90 mmHg after first liter.  She stated her nausea was better.  She is off oxygen and no longer wheezing although did not receive the Xopenex neb earlier.  I have switched to DuoNeb now.  She is also complaining of leg cramps.  I explained to her that with her hypotension I am unable to give her opioid analgesics and unable to give her NSAIDs with her current kidney function.  She could have her gabapentin later if not nauseous.  Tennis Must, MD.

## 2021-09-22 NOTE — ED Provider Notes (Signed)
[facility] DEPT Provider Note   CSN: [id number] Arrival date & time: 09/22/21  6440     History  Chief Complaint  Patient presents with   Hyperglycemia    Anna Garcia is a 41 y.o. female.  Patient complains of nausea vomiting and shortness of breath.  Patient has a history of type 2 diabetes and has been admitted before for DKA   Hyperglycemia Associated symptoms: shortness of breath and vomiting   Associated symptoms: no abdominal pain, no chest pain, no fatigue and no fever   Shortness of Breath Severity:  Moderate Onset quality:  Sudden Timing:  Constant Progression:  Worsening Chronicity:  New Context: activity   Relieved by:  Nothing Worsened by:  Nothing Ineffective treatments:  None tried Associated symptoms: vomiting   Associated symptoms: no abdominal pain, no chest pain, no cough, no fever, no headaches and no rash       Home Medications Prior to Admission medications   Medication Sig Start Date End Date Taking? Authorizing Provider  DEXILANT 60 MG capsule Take 60 mg by mouth daily. 05/24/19  Yes [provider]  famotidine (PEPCID) 40 MG tablet Take 40 mg by mouth 2 (two) times daily. 05/28/19  Yes [provider]  gabapentin (NEURONTIN) 300 MG capsule Take 1 capsule (300 mg total) by mouth 3 (three) times daily. 05/28/21  Yes Eugenie Filler, MD  levalbuterol [facility] HFA) 45 MCG/ACT inhaler Inhale 1 puff into the lungs every 6 (six) hours as needed for wheezing.   Yes [provider]  losartan (COZAAR) 25 MG tablet Take 25 mg by mouth daily. 08/31/21  Yes [provider]  mometasone-formoterol (DULERA) 100-5 MCG/ACT AERO Inhale 2 puffs into the lungs 2 (two) times daily. 07/07/20  Yes Mercy Riding, MD  montelukast (SINGULAIR) 10 MG tablet Take 10 mg by mouth at bedtime. 05/24/19  Yes [provider]  MOTEGRITY 1 MG TABS Take 1 mg by mouth daily. 08/31/21  Yes [provider]   rosuvastatin (CRESTOR) 20 MG tablet Take 20 mg by mouth daily. 05/24/19  Yes [provider]  acetaminophen (TYLENOL) 325 MG tablet Take 2 tablets (650 mg total) by mouth every 6 (six) hours as needed for mild pain (or Fever >/= 101). Patient not taking: Reported on 09/22/2021 05/28/21   Eugenie Filler, MD  bethanechol (URECHOLINE) 5 MG tablet Take 5 mg by mouth 3 (three) times daily. Patient not taking: Reported on 05/21/2021 08/28/20   [provider]  insulin glargine (LANTUS SOLOSTAR) 100 UNIT/ML Solostar Pen Inject 22 Units into the skin daily. Patient taking differently: Inject 12 Units into the skin at bedtime. 05/28/21   Eugenie Filler, MD  insulin glulisine (APIDRA SOLOSTAR) 100 UNIT/ML Solostar Pen Inject 1-15 Units into the skin See admin instructions. Changes as per carb intake as needed high CBG 07/13/20   [provider]  insulin aspart (NOVOLOG) 100 UNIT/ML injection Inject 3 units three times a day with meals if you eat over 50% of your meals. In addition, use sliding scale as follows: CBG 70 - 120: 0 units CBG 121 - 150: 2 units CBG 151 - 200: 3 units CBG 201 - 250: 5 units CBG 251 - 300: 8 units CBG 301 - 350: 11 units CBG 351 - 400: 15 units CBG > 400: call MD and obtain STAT lab verification Patient not taking: No sig reported 07/07/20 10/13/20  Mercy Riding, MD      Allergies  Patient has no known allergies.    Review of Systems   Review of Systems  Constitutional:  Negative for appetite change, fatigue and fever.  HENT:  Negative for congestion, ear discharge and sinus pressure.   Eyes:  Negative for discharge.  Respiratory:  Positive for shortness of breath. Negative for cough.   Cardiovascular:  Negative for chest pain.  Gastrointestinal:  Positive for vomiting. Negative for abdominal pain and diarrhea.  Genitourinary:  Negative for frequency and hematuria.  Musculoskeletal:  Negative for back pain.  Skin:  Negative for rash.  Neurological:   Negative for seizures and headaches.  Psychiatric/Behavioral:  Negative for hallucinations.    Physical Exam Updated Vital Signs BP 127/75   Pulse (!) 111   Temp 98.2 F (36.8 C) (Oral)   Resp 16   Ht '5\' 4"'$  (1.626 m)   Wt 74.2 kg   SpO2 100%   BMI 28.08 kg/m  Physical Exam Vitals and nursing note reviewed.  Constitutional:      Appearance: She is well-developed.  HENT:     Head: Normocephalic.     Nose: Nose normal.  Eyes:     General: No scleral icterus.    Conjunctiva/sclera: Conjunctivae normal.  Neck:     Thyroid: No thyromegaly.  Cardiovascular:     Rate and Rhythm: Regular rhythm. Tachycardia present.     Heart sounds: No murmur heard.   No friction rub. No gallop.  Pulmonary:     Breath sounds: No stridor. No wheezing or rales.     Comments: Tachypnea Chest:     Chest wall: No tenderness.  Abdominal:     General: There is no distension.     Tenderness: There is no abdominal tenderness. There is no rebound.  Musculoskeletal:        General: Normal range of motion.     Cervical back: Neck supple.  Lymphadenopathy:     Cervical: No cervical adenopathy.  Skin:    Findings: No erythema or rash.  Neurological:     Mental Status: She is alert and oriented to person, place, and time.     Motor: No abnormal muscle tone.     Coordination: Coordination normal.  Psychiatric:        Behavior: Behavior normal.    ED Results / Procedures / Treatments   Labs (all labs ordered are listed, but only abnormal results are displayed) Labs Reviewed  COMPREHENSIVE METABOLIC PANEL - Abnormal; Notable for the following components:      Result Value   CO2 <7 (*)    Glucose, Bld 355 (*)    BUN 27 (*)    Creatinine, Ser 2.53 (*)    AST 123 (*)    ALT 83 (*)    Alkaline Phosphatase 145 (*)    Total Bilirubin 1.6 (*)    GFR, Estimated 24 (*)    All other components within normal limits  CBC WITH DIFFERENTIAL/PLATELET - Abnormal; Notable for the following components:    WBC 14.6 (*)    RBC 2.62 (*)    Hemoglobin 9.3 (*)    HCT 30.9 (*)    MCV 117.9 (*)    MCH 35.5 (*)    RDW 16.1 (*)    Lymphs Abs 5.8 (*)    Eosinophils Absolute 0.6 (*)    Basophils Absolute 0.2 (*)    Abs Immature Granulocytes 0.12 (*)    All other components within normal limits  BLOOD GAS, VENOUS - Abnormal; Notable for the following  components:   pH, Ven <6.95 (*)    pCO2, Ven 25 (*)    Bicarbonate 4.7 (*)    Acid-base deficit 27.7 (*)    All other components within normal limits  BASIC METABOLIC PANEL - Abnormal; Notable for the following components:   Sodium 133 (*)    CO2 <7 (*)    Glucose, Bld 352 (*)    BUN 27 (*)    Creatinine, Ser 2.64 (*)    GFR, Estimated 23 (*)    All other components within normal limits  BETA-HYDROXYBUTYRIC ACID - Abnormal; Notable for the following components:   Beta-Hydroxybutyric Acid >8.00 (*)    All other components within normal limits  I-STAT CHEM 8, ED - Abnormal; Notable for the following components:   Sodium 134 (*)    BUN 23 (*)    Creatinine, Ser 2.70 (*)    Glucose, Bld 358 (*)    TCO2 6 (*)    Hemoglobin 10.9 (*)    HCT 32.0 (*)    All other components within normal limits  CBG MONITORING, ED - Abnormal; Notable for the following components:   Glucose-Capillary 349 (*)    All other components within normal limits  CBG MONITORING, ED - Abnormal; Notable for the following components:   Glucose-Capillary 351 (*)    All other components within normal limits  CBG MONITORING, ED - Abnormal; Notable for the following components:   Glucose-Capillary 283 (*)    All other components within normal limits  BASIC METABOLIC PANEL  BASIC METABOLIC PANEL  BASIC METABOLIC PANEL  BETA-HYDROXYBUTYRIC ACID  URINALYSIS, ROUTINE W REFLEX MICROSCOPIC  I-STAT BETA HCG BLOOD, ED ([facility], [facility], [facility] ONLY)    EKG None  Radiology DG Chest Port 1 View  Result Date: 09/22/2021 CLINICAL DATA:  Short of breath EXAM: PORTABLE CHEST 1 VIEW COMPARISON:   None Available. FINDINGS: The lungs are clear and negative for focal airspace consolidation, pulmonary edema or suspicious pulmonary nodule. No pleural effusion or pneumothorax. Cardiac and mediastinal contours are within normal limits. No acute fracture or lytic or blastic osseous lesions. The visualized upper abdominal bowel gas pattern is unremarkable. IMPRESSION: Negative chest x-ray. Electronically Signed   By: Jacqulynn Cadet M.D.   On: 09/22/2021 07:41    Procedures Procedures    Medications Ordered in ED Medications  insulin regular, human (MYXREDLIN) 100 units/ 100 mL infusion (5.5 Units/hr Intravenous Rate/Dose Change 09/22/21 0925)  lactated ringers infusion ( Intravenous New Bag/Given 09/22/21 0819)  dextrose 5 % in lactated ringers infusion (has no administration in time range)  dextrose 50 % solution 0-50 mL (has no administration in time range)  potassium chloride 10 mEq in 100 mL IVPB (10 mEq Intravenous New Bag/Given 09/22/21 0930)  acetaminophen (TYLENOL) tablet 650 mg (has no administration in time range)    Or  acetaminophen (TYLENOL) suppository 650 mg (has no administration in time range)  prochlorperazine (COMPAZINE) injection 5 mg (has no administration in time range)  metoCLOPramide (REGLAN) injection 5 mg (has no administration in time range)  sodium chloride 0.9 % bolus 2,000 mL (2,000 mLs Intravenous New Bag/Given 09/22/21 0722)  metoCLOPramide (REGLAN) injection 10 mg (10 mg Intravenous Given 09/22/21 0723)  sodium chloride 0.9 % bolus 2,000 mL (2,000 mLs Intravenous New Bag/Given 09/22/21 0854)  sodium bicarbonate injection 50 mEq (50 mEq Intravenous Given 09/22/21 0846)  sodium bicarbonate 150 mEq in sterile water 1,150 mL infusion ( Intravenous New Bag/Given 09/22/21 8299)    ED Course/ Medical Decision  Making/ A&P  CRITICAL CARE Performed by: Milton Ferguson Total critical care time: 45 minutes Critical care time was exclusive of separately billable procedures and  treating other patients. Critical care was necessary to treat or prevent imminent or life-threatening deterioration. Critical care was time spent personally by me on the following activities: development of treatment plan with patient and/or surrogate as well as nursing, discussions with consultants, evaluation of patient's response to treatment, examination of patient, obtaining history from patient or surrogate, ordering and performing treatments and interventions, ordering and review of laboratory studies, ordering and review of radiographic studies, pulse oximetry and re-evaluation of patient's condition.   Patient has not significant DKA but she is oriented x4.  Vital signs show a tachycardia and tachypnea but no hypotension.  I spoke with critical care Dr. Chase Caller and he recommended 5 L of fluids and a bicarb drip with an amp of bicarb initially along with an insulin drip and patient can be admitted to the hospitalist service to stepdown unit.  He will be available for formal consult if necessary                         Medical Decision Making Amount and/or Complexity of Data Reviewed Labs: ordered. Radiology: ordered.  Risk Prescription drug management. Decision regarding hospitalization.   Significant DKA with acidosis.  Patient is started on an insulin drip and a bicarb drip and will be admitted to medicine to the stepdown unit This patient presents to the ED for concern of shortness of breath and nausea, this involves an extensive number of treatment options, and is a complaint that carries with it a high risk of complications and morbidity.  The differential diagnosis includes DKA, pneumonia   Co morbidities that complicate the patient evaluation  Diabetes   Additional history obtained:  Additional history obtained from patient External records from outside source obtained and reviewed including hospital record   Lab Tests:  I Ordered, and personally interpreted labs.   The pertinent results include: CBC and chemistries which show elevated white count along with an acidosis with an elevated creatinine of 2.7 and a glucose of 358.  Her venous gas showed pH less than 6.95   Imaging Studies ordered:  I ordered imaging studies including chest x-ray I independently visualized and interpreted imaging which showed unremarkable I agree with the radiologist interpretation   Cardiac Monitoring: / EKG:  The patient was maintained on a cardiac monitor.  I personally viewed and interpreted the cardiac monitored which showed an underlying rhythm of: Sinus tach   Consultations Obtained:  I requested consultation with the critical care and hospitalist,  and discussed lab and imaging findings as well as pertinent plan - they recommend: Admission to stepdown with insulin drip, bicarb drip and fluid bolus   Problem List / ED Course / Critical interventions / Medication management  Diabetes with DKA I ordered medication including normal saline bolus, Reglan for nausea, insulin drip Reevaluation of the patient after these medicines showed that the patient improved I have reviewed the patients home medicines and have made adjustments as needed   Social Determinants of Health:  None   Test / Admission - Considered:  No additional testing necessary patient is being admitted to medicine in the stepdown unit        Final Clinical Impression(s) / ED Diagnoses Final diagnoses:  Diabetic ketoacidosis without coma associated with diabetes mellitus due to underlying condition ([place])    Rx /  DC Orders ED Discharge Orders     None         Milton Ferguson, MD 09/22/21 [phone]

## 2021-09-22 NOTE — H&P (Signed)
History and Physical    Patient: Anna Garcia DOB: [birth year] DOA: 09/22/2021 DOS: the patient was seen and examined on 09/22/2021 PCP: Day, [name], MD  Patient coming from: Home  Chief Complaint:  Chief Complaint  Patient presents with   Hyperglycemia   HPI: Anna Garcia is a 41 y.o. female with medical history significant of type I DM, gastroparesis, hypertension, diabetic peripheral neuropathy, overweight, tobacco use, stage 3a CKD who is coming to the emergency department with complaints of hyperglycemia, dyspnea, nausea and vomiting.  No fever or chills.  She has not missed any of her insulin doses. No fever, chills or night sweats. No sore throat, rhinorrhea, dyspnea, wheezing or hemoptysis.  No chest pain, palpitations, diaphoresis, PND, orthopnea or pitting edema of the lower extremities.  No appetite changes, abdominal pain, diarrhea, constipation, melena or hematochezia.  No flank pain, dysuria, frequency or hematuria.  No polyuria, polydipsia, polyphagia or blurred vision.  ED course: Initial vital signs were temperature 98.2 F, pulse 110, respiration 36, BP 143/93 mmHg and O2 sat 100% on room air.  The patient was given 2000 mL of normal saline bolus and started on an insulin.  Case discussed with PCCM (Dr. Chase Caller) who recommended admission to stepdown by our service, more IV fluids and initiation of bicarbonate infusion.  He has made himself available for further questions if needed.  Lab work: CBC showed white count of 14.6, hemoglobin 9.3 g/dL with an MCV of 117.9 fL and platelets 232.  Venous blood gas showed a pH of less than 6.95, PCO2 of 25, PO2 of 32 mmHg, bicarbonate of 4.7 and acid-base deficit 27.7 mmol/L.  BMP showed normal electrolytes, except for CO2.  Glucose is 352, BUN 27 creatinine 2.64 mg/dL.  LFTs with normal total protein and albumin.  AST 123, ALT 83, alkaline phosphatase 140 units/L and total bilirubin 1.6 mg/dL.  Imaging: Portable 1 view chest  radiograph with no acute findings.   Review of Systems: As mentioned in the history of present illness. All other systems reviewed and are negative.  Past Medical History:  Diagnosis Date   Diabetes mellitus    Gastroparesis    Hypertension    Neuropathy    History reviewed. No pertinent surgical history. Social History:  reports that she has quit smoking. Her smoking use included cigarettes. She smoked an average of .5 packs per day. She has never used smokeless tobacco. She reports that she does not drink alcohol and does not use drugs.  No Known Allergies  Family History  Problem Relation Age of Onset   Hypertension Mother    Dementia Mother    Heart attack Father    Hypertension Father    Heart disease Father    Hypertension Brother    Stroke Maternal Grandfather     Prior to Admission medications   Medication Sig Start Date End Date Taking? Authorizing Provider  acetaminophen (TYLENOL) 325 MG tablet Take 2 tablets (650 mg total) by mouth every 6 (six) hours as needed for mild pain (or Fever >/= 101). 05/28/21   Eugenie Filler, MD  bethanechol (URECHOLINE) 5 MG tablet Take 5 mg by mouth 3 (three) times daily. Patient not taking: Reported on 05/21/2021 08/28/20   [provider]  buprenorphine (BUTRANS) 5 MCG/HR PTWK Place 1 patch onto the skin once a week. 04/19/21   [provider]  cholecalciferol (VITAMIN D3) 25 MCG (1000 UNIT) tablet Take 1,000 Units by mouth daily.    [provider]  DEXILANT 60 MG capsule Take 60 mg by mouth daily. 05/24/19   [provider]  famotidine (PEPCID) 40 MG tablet Take 40 mg by mouth 2 (two) times daily. 05/28/19   [provider]  fluconazole (DIFLUCAN) 150 MG tablet Take 150 mg by mouth once. 05/21/21   [provider]  gabapentin (NEURONTIN) 300 MG capsule Take 1 capsule (300 mg total) by mouth 3 (three) times daily. 05/28/21   Eugenie Filler, MD  insulin glargine (LANTUS SOLOSTAR) 100  UNIT/ML Solostar Pen Inject 22 Units into the skin daily. 05/28/21   Eugenie Filler, MD  insulin glulisine (APIDRA SOLOSTAR) 100 UNIT/ML Solostar Pen Inject 1-15 Units into the skin See admin instructions. Changes as per carb intake as needed high CBG 07/13/20   [provider]  levalbuterol (XOPENEX HFA) 45 MCG/ACT inhaler Inhale 1 puff into the lungs every 6 (six) hours as needed for wheezing.    [provider]  mometasone-formoterol (DULERA) 100-5 MCG/ACT AERO Inhale 2 puffs into the lungs 2 (two) times daily. 07/07/20   Mercy Riding, MD  montelukast (SINGULAIR) 10 MG tablet Take 10 mg by mouth at bedtime. 05/24/19   [provider]  Multiple Vitamins-Minerals (MULTIVITAMIN WITH MINERALS) tablet Take 1 tablet by mouth daily.    [provider]  rosuvastatin (CRESTOR) 20 MG tablet Take 20 mg by mouth daily. 05/24/19   [provider]  insulin aspart (NOVOLOG) 100 UNIT/ML injection Inject 3 units three times a day with meals if you eat over 50% of your meals. In addition, use sliding scale as follows: CBG 70 - 120: 0 units CBG 121 - 150: 2 units CBG 151 - 200: 3 units CBG 201 - 250: 5 units CBG 251 - 300: 8 units CBG 301 - 350: 11 units CBG 351 - 400: 15 units CBG > 400: call MD and obtain STAT lab verification Patient not taking: No sig reported 07/07/20 10/13/20  Mercy Riding, MD    Physical Exam: Vitals:   09/22/21 0654 09/22/21 0655 09/22/21 0800  BP: (!) 143/93  127/75  Pulse: (!) 110  (!) 111  Resp: (!) 36  16  Temp: 98.2 F (36.8 C)    TempSrc: Oral    SpO2: 100%  100%  Weight:  74.2 kg   Height:  '5\' 4"'$  (1.626 m)    Physical Exam Vitals and nursing note reviewed.  Constitutional:      General: She is awake.     Appearance: Normal appearance. She is overweight. She is ill-appearing. She is not toxic-appearing or diaphoretic.     Interventions: Nasal cannula in place.  HENT:     Head: Normocephalic.     Mouth/Throat:     Mouth: Mucous  membranes are dry.  Eyes:     General: No scleral icterus.    Pupils: Pupils are equal, round, and reactive to light.  Neck:     Vascular: No JVD.  Cardiovascular:     Rate and Rhythm: Regular rhythm. Tachycardia present.     Heart sounds: S1 normal and S2 normal.  Pulmonary:     Breath sounds: Wheezing present.  Abdominal:     General: Bowel sounds are normal. There is no distension.     Palpations: Abdomen is soft.     Tenderness: There is no abdominal tenderness. There is no right CVA tenderness, left CVA tenderness or guarding.  Musculoskeletal:     Cervical back: Neck supple.  Right lower leg: No edema.     Left lower leg: No edema.  Skin:    General: Skin is warm and dry.     Coloration: Skin is not jaundiced.  Neurological:     General: No focal deficit present.     Mental Status: She is alert and oriented to person, place, and time.  Psychiatric:        Mood and Affect: Mood normal.        Behavior: Behavior normal. Behavior is cooperative.    Data Reviewed:  There are no new results to review at this time.  Assessment and Plan: Principal Problem:   SIRS (systemic inflammatory response syndrome) (HCC) Presentation in the setting of:   DKA (diabetic ketoacidosis) ([place]) Observation/stepdown. Keep NPO. Continue IV fluids. Continue insulin infusion. Continue bicarbonate infusion. Monitor CBG closely. BMP every 4 hours. BHA every 8 hours. Consult diabetes coordinator.  Active Problems:   CKD stage G3a/A3, GFR 45-59 and  albumin creatinine ratio >300 mg/g (HCC) Superimposed on: Continue IV fluids. Hold ARB/ACE. Avoid hypotension. Avoid nephrotoxins. Monitor intake and output. Monitor renal function electrolytes.    Transaminitis In the setting of dehydration. Continue current treatment. Follow-up LFTs in the morning.    Asthma Xopenex neb x1 now. Xopenex nebs every 6 hours as needed.    Hypertension Hold losartan. Monitor blood pressure and  heart rate. Parenteral antihypertensive as needed.    Gastroparesis Continue metoclopramide 5 mg IVP every 6 hours.     Advance Care Planning:   Code Status: Full code.  Consults:   Family Communication:   Severity of Illness: The appropriate patient status for this patient is OBSERVATION. Observation status is judged to be reasonable and necessary in order to provide the required intensity of service to ensure the patient's safety. The patient's presenting symptoms, physical exam findings, and initial radiographic and laboratory data in the context of their medical condition is felt to place them at decreased risk for further clinical deterioration. Furthermore, it is anticipated that the patient will be medically stable for discharge from the hospital within 2 midnights of admission.   Author: Reubin Milan, MD 09/22/2021 8:47 AM  For on call review [email or web address].   This document was prepared using Dragon voice recognition software and may contain some unintended transcription errors.

## 2021-09-23 ENCOUNTER — Encounter (HOSPITAL_COMMUNITY): Payer: Self-pay | Admitting: Anesthesiology

## 2021-09-23 DIAGNOSIS — R6510 Systemic inflammatory response syndrome (SIRS) of non-infectious origin without acute organ dysfunction: Secondary | ICD-10-CM | POA: Diagnosis present

## 2021-09-23 DIAGNOSIS — K219 Gastro-esophageal reflux disease without esophagitis: Secondary | ICD-10-CM | POA: Diagnosis present

## 2021-09-23 DIAGNOSIS — E1042 Type 1 diabetes mellitus with diabetic polyneuropathy: Secondary | ICD-10-CM | POA: Diagnosis present

## 2021-09-23 DIAGNOSIS — I129 Hypertensive chronic kidney disease with stage 1 through stage 4 chronic kidney disease, or unspecified chronic kidney disease: Secondary | ICD-10-CM | POA: Diagnosis present

## 2021-09-23 DIAGNOSIS — N1831 Chronic kidney disease, stage 3a: Secondary | ICD-10-CM | POA: Diagnosis present

## 2021-09-23 DIAGNOSIS — I959 Hypotension, unspecified: Secondary | ICD-10-CM | POA: Diagnosis present

## 2021-09-23 DIAGNOSIS — K3184 Gastroparesis: Secondary | ICD-10-CM | POA: Diagnosis present

## 2021-09-23 DIAGNOSIS — E0810 Diabetes mellitus due to underlying condition with ketoacidosis without coma: Secondary | ICD-10-CM

## 2021-09-23 DIAGNOSIS — D696 Thrombocytopenia, unspecified: Secondary | ICD-10-CM | POA: Diagnosis present

## 2021-09-23 DIAGNOSIS — D631 Anemia in chronic kidney disease: Secondary | ICD-10-CM | POA: Diagnosis present

## 2021-09-23 DIAGNOSIS — E860 Dehydration: Secondary | ICD-10-CM | POA: Diagnosis present

## 2021-09-23 DIAGNOSIS — K760 Fatty (change of) liver, not elsewhere classified: Secondary | ICD-10-CM | POA: Diagnosis present

## 2021-09-23 DIAGNOSIS — D539 Nutritional anemia, unspecified: Secondary | ICD-10-CM | POA: Diagnosis present

## 2021-09-23 DIAGNOSIS — N179 Acute kidney failure, unspecified: Secondary | ICD-10-CM | POA: Diagnosis present

## 2021-09-23 DIAGNOSIS — E1043 Type 1 diabetes mellitus with diabetic autonomic (poly)neuropathy: Secondary | ICD-10-CM | POA: Diagnosis present

## 2021-09-23 DIAGNOSIS — E1110 Type 2 diabetes mellitus with ketoacidosis without coma: Secondary | ICD-10-CM | POA: Diagnosis present

## 2021-09-23 DIAGNOSIS — I10 Essential (primary) hypertension: Secondary | ICD-10-CM | POA: Diagnosis not present

## 2021-09-23 DIAGNOSIS — E1010 Type 1 diabetes mellitus with ketoacidosis without coma: Secondary | ICD-10-CM | POA: Diagnosis present

## 2021-09-23 DIAGNOSIS — K222 Esophageal obstruction: Secondary | ICD-10-CM | POA: Diagnosis present

## 2021-09-23 DIAGNOSIS — E663 Overweight: Secondary | ICD-10-CM | POA: Diagnosis present

## 2021-09-23 DIAGNOSIS — E1065 Type 1 diabetes mellitus with hyperglycemia: Secondary | ICD-10-CM | POA: Diagnosis not present

## 2021-09-23 DIAGNOSIS — R21 Rash and other nonspecific skin eruption: Secondary | ICD-10-CM | POA: Diagnosis not present

## 2021-09-23 DIAGNOSIS — D1803 Hemangioma of intra-abdominal structures: Secondary | ICD-10-CM | POA: Diagnosis present

## 2021-09-23 DIAGNOSIS — E1022 Type 1 diabetes mellitus with diabetic chronic kidney disease: Secondary | ICD-10-CM | POA: Diagnosis present

## 2021-09-23 DIAGNOSIS — K449 Diaphragmatic hernia without obstruction or gangrene: Secondary | ICD-10-CM | POA: Diagnosis present

## 2021-09-23 DIAGNOSIS — R7401 Elevation of levels of liver transaminase levels: Secondary | ICD-10-CM | POA: Diagnosis present

## 2021-09-23 LAB — BASIC METABOLIC PANEL
Anion gap: 8 (ref 5–15)
Anion gap: 9 (ref 5–15)
BUN: 16 mg/dL (ref 6–20)
BUN: 12 mg/dL (ref 6–20)
CO2: 17 mmol/L — ABNORMAL LOW (ref 22–32)
CO2: 13 mmol/L — ABNORMAL LOW (ref 22–32)
Calcium: 7.9 mg/dL — ABNORMAL LOW (ref 8.9–10.3)
Calcium: 7.6 mg/dL — ABNORMAL LOW (ref 8.9–10.3)
Chloride: 116 mmol/L — ABNORMAL HIGH (ref 98–111)
Chloride: 117 mmol/L — ABNORMAL HIGH (ref 98–111)
Creatinine, Ser: 1.72 mg/dL — ABNORMAL HIGH (ref 0.44–1.00)
Creatinine, Ser: 1.78 mg/dL — ABNORMAL HIGH (ref 0.44–1.00)
GFR, Estimated: 38 mL/min — ABNORMAL LOW (ref >60)
GFR, Estimated: 37 mL/min — ABNORMAL LOW (ref >60)
Glucose, Bld: 119 mg/dL — ABNORMAL HIGH (ref 70–99)
Glucose, Bld: 287 mg/dL — ABNORMAL HIGH (ref 70–99)
Potassium: 2.9 mmol/L — ABNORMAL LOW (ref 3.5–5.1)
Potassium: 3.8 mmol/L (ref 3.5–5.1)
Sodium: 141 mmol/L (ref 135–145)
Sodium: 139 mmol/L (ref 135–145)

## 2021-09-23 LAB — PHOSPHORUS
Phosphorus: 1.3 mg/dL — ABNORMAL LOW (ref 2.5–4.6)
Phosphorus: 1.2 mg/dL — ABNORMAL LOW (ref 2.5–4.6)
Phosphorus: 3 mg/dL (ref 2.5–4.6)

## 2021-09-23 LAB — COMPREHENSIVE METABOLIC PANEL
ALT: 62 U/L — ABNORMAL HIGH (ref 0–44)
AST: 115 U/L — ABNORMAL HIGH (ref 15–41)
Albumin: 3 g/dL — ABNORMAL LOW (ref 3.5–5.0)
Alkaline Phosphatase: 121 U/L (ref 38–126)
Anion gap: 9 (ref 5–15)
BUN: 18 mg/dL (ref 6–20)
CO2: 16 mmol/L — ABNORMAL LOW (ref 22–32)
Calcium: 7.8 mg/dL — ABNORMAL LOW (ref 8.9–10.3)
Chloride: 118 mmol/L — ABNORMAL HIGH (ref 98–111)
Creatinine, Ser: 1.87 mg/dL — ABNORMAL HIGH (ref 0.44–1.00)
GFR, Estimated: 34 mL/min — ABNORMAL LOW (ref >60)
Glucose, Bld: 139 mg/dL — ABNORMAL HIGH (ref 70–99)
Potassium: 3.1 mmol/L — ABNORMAL LOW (ref 3.5–5.1)
Sodium: 143 mmol/L (ref 135–145)
Total Bilirubin: 1.1 mg/dL (ref 0.3–1.2)
Total Protein: 6 g/dL — ABNORMAL LOW (ref 6.5–8.1)

## 2021-09-23 LAB — IRON AND TIBC
Iron: 60 ug/dL (ref 28–170)
Saturation Ratios: 25 % (ref 10.4–31.8)
TIBC: 245 ug/dL — ABNORMAL LOW (ref 250–450)
UIBC: 185 ug/dL

## 2021-09-23 LAB — CBC
HCT: 23.7 % — ABNORMAL LOW (ref 36.0–46.0)
Hemoglobin: 7.7 g/dL — ABNORMAL LOW (ref 12.0–15.0)
MCH: 35.6 pg — ABNORMAL HIGH (ref 26.0–34.0)
MCHC: 32.5 g/dL (ref 30.0–36.0)
MCV: 109.7 fL — ABNORMAL HIGH (ref 80.0–100.0)
Platelets: 122 10*3/uL — ABNORMAL LOW (ref 150–400)
RBC: 2.16 MIL/uL — ABNORMAL LOW (ref 3.87–5.11)
RDW: 16.2 % — ABNORMAL HIGH (ref 11.5–15.5)
WBC: 5.7 10*3/uL (ref 4.0–10.5)
nRBC: 0 % (ref 0.0–0.2)

## 2021-09-23 LAB — RETICULOCYTES
Immature Retic Fract: 20.6 % — ABNORMAL HIGH (ref 2.3–15.9)
RBC.: 2.18 MIL/uL — ABNORMAL LOW (ref 3.87–5.11)
Retic Count, Absolute: 32.5 10*3/uL (ref 19.0–186.0)
Retic Ct Pct: 1.5 % (ref 0.4–3.1)

## 2021-09-23 LAB — MAGNESIUM
Magnesium: 1.3 mg/dL — ABNORMAL LOW (ref 1.7–2.4)
Magnesium: 1.8 mg/dL (ref 1.7–2.4)

## 2021-09-23 LAB — GLUCOSE, CAPILLARY
Glucose-Capillary: 137 mg/dL — ABNORMAL HIGH (ref 70–99)
Glucose-Capillary: 119 mg/dL — ABNORMAL HIGH (ref 70–99)
Glucose-Capillary: 282 mg/dL — ABNORMAL HIGH (ref 70–99)
Glucose-Capillary: 257 mg/dL — ABNORMAL HIGH (ref 70–99)
Glucose-Capillary: 255 mg/dL — ABNORMAL HIGH (ref 70–99)
Glucose-Capillary: 262 mg/dL — ABNORMAL HIGH (ref 70–99)

## 2021-09-23 LAB — FERRITIN: Ferritin: 119 ng/mL (ref 11–307)

## 2021-09-23 LAB — FOLATE: Folate: 7.1 ng/mL (ref >5.9)

## 2021-09-23 LAB — BETA-HYDROXYBUTYRIC ACID: Beta-Hydroxybutyric Acid: 2.62 mmol/L — ABNORMAL HIGH (ref 0.05–0.27)

## 2021-09-23 LAB — VITAMIN D 25 HYDROXY (VIT D DEFICIENCY, FRACTURES): Vit D, 25-Hydroxy: 15.69 ng/mL — ABNORMAL LOW (ref 30–100)

## 2021-09-23 LAB — VITAMIN B12: Vitamin B-12: 619 pg/mL (ref 180–914)

## 2021-09-23 LAB — HIV ANTIBODY (ROUTINE TESTING W REFLEX): HIV Screen 4th Generation wRfx: NONREACTIVE

## 2021-09-23 LAB — MRSA NEXT GEN BY PCR, NASAL: MRSA by PCR Next Gen: NOT DETECTED

## 2021-09-23 MED ORDER — ENOXAPARIN SODIUM 40 MG/0.4ML IJ SOSY: 40.0000 mg | PREFILLED_SYRINGE | INTRAMUSCULAR | Status: DC

## 2021-09-23 MED ORDER — INSULIN ASPART 100 UNIT/ML IJ SOLN: 0.0000 [IU] | Freq: Three times a day (TID) | INTRAMUSCULAR | Status: DC

## 2021-09-23 MED ORDER — SODIUM CHLORIDE 0.9 % IV SOLN: INTRAVENOUS | Status: DC

## 2021-09-23 MED ORDER — SODIUM CHLORIDE 0.9 % IV SOLN: INTRAVENOUS | Status: DC | PRN

## 2021-09-23 MED ORDER — INSULIN GLARGINE-YFGN 100 UNIT/ML ~~LOC~~ SOLN
15.0000 [IU] | Freq: Every day | SUBCUTANEOUS | Status: DC
  Administered 2021-09-23: 15 [IU] via SUBCUTANEOUS
  Filled 2021-09-23: qty 0.15

## 2021-09-23 MED ORDER — PANTOPRAZOLE SODIUM 40 MG PO TBEC
40.0000 mg | DELAYED_RELEASE_TABLET | Freq: Two times a day (BID) | ORAL | Status: DC
  Administered 2021-09-23 – 2021-09-26 (×7): 40 mg via ORAL
  Filled 2021-09-23 (×6): qty 1

## 2021-09-23 MED ORDER — POTASSIUM CHLORIDE 10 MEQ/100ML IV SOLN
10.0000 meq | INTRAVENOUS | Status: AC
  Administered 2021-09-23 (×2): 10 meq via INTRAVENOUS
  Filled 2021-09-23 (×2): qty 100

## 2021-09-23 MED ORDER — MAGNESIUM SULFATE 2 GM/50ML IV SOLN
2.0000 g | Freq: Once | INTRAVENOUS | Status: AC
  Administered 2021-09-23: 2 g via INTRAVENOUS
  Filled 2021-09-23: qty 50

## 2021-09-23 MED ORDER — INSULIN ASPART 100 UNIT/ML IJ SOLN
4.0000 [IU] | Freq: Three times a day (TID) | INTRAMUSCULAR | Status: DC
  Administered 2021-09-23: 4 [IU] via SUBCUTANEOUS

## 2021-09-23 MED ORDER — POTASSIUM PHOSPHATES 15 MMOLE/5ML IV SOLN
45.0000 mmol | Freq: Once | INTRAVENOUS | Status: AC
  Administered 2021-09-23: 45 mmol via INTRAVENOUS
  Filled 2021-09-23: qty 15

## 2021-09-23 MED ORDER — POTASSIUM CHLORIDE CRYS ER 20 MEQ PO TBCR
40.0000 meq | EXTENDED_RELEASE_TABLET | ORAL | Status: AC
  Administered 2021-09-23 (×2): 40 meq via ORAL
  Filled 2021-09-23 (×2): qty 2

## 2021-09-23 MED ORDER — INSULIN ASPART 100 UNIT/ML IJ SOLN
0.0000 [IU] | Freq: Every day | INTRAMUSCULAR | Status: DC
  Administered 2021-09-23: 3 [IU] via SUBCUTANEOUS

## 2021-09-23 NOTE — Progress Notes (Signed)
PROGRESS NOTE    Anna Garcia  OIN:[id number] DOB: [birth year] DOA: 09/22/2021 PCP: [name], MD   Brief Narrative: 41 year old with past medical history significant for type 1 diabetes, gastroparesis, hypertension, diabetic peripheral neuropathy, overweight, tobacco use, stage IIIa CKD who presents complaining of hyperglycemia, dyspnea, nausea and vomiting.  She has not missed any of her insulin.  Patient was found to be in DKA with a CO2 less than 7, tachycardic, tachypneic.  pH venous blood gas less than 6.9.  Anion gap 20.  Creatinine 2.6, prior creatinine range 1.3.     Assessment & Plan:   Principal Problem:   DKA (diabetic ketoacidosis) ([place]) Active Problems:   SIRS (systemic inflammatory response syndrome) (HCC)   Asthma   AKI (acute kidney injury) ([place])   Transaminitis   Hypertension   Gastroparesis   CKD stage G3a/A3, GFR 45-59 and albumin creatinine ratio >300 mg/g (HCC)   Hypomagnesemia   Hypophosphatemia  1- DKA, Diabetes Type 1:  -Presented with Bicarb less than 7, CBG 355, PH 6.9, anion gap 20, Beta hydroxybutyric acid more than 8.  -Work Up for infection: Negative chest x ray, UA;  Negative nitrates, WBC 0-5. -Treated with bicarb gtt, IV fluids, Insulin Gtt.  -She was transition to long acting insulin. Gap close bicarb was still 16.  -Repeat B-met. Bicarb 17, kcl 2.9,  Repeat bmet this afternoon.   2-Hypotension;  Improved with IV fluids.   Recurrent Nausea, vomiting, esophageal dysphagia.  She is suppose to get Endoscopy out patient.  She presented with dehydration.  GI consulted.   Increase anion gap metabolic acidosis.  In setting DKA, dehydration.  Continue with IV fluids.  Received bicarb gtt  AKI on CKD stage IIIa;  Prior creatinene range 1.3 Presents with Cr 2.6 Likely related to hypovolemia.  Continue with IV fluids.  Improving cr down to 1.8  Hypokalemia; replaced IV>    Hypophosphatemia; replete IV  Hypomagnesemia:  Replaced  IV> repeat labs in am.   Transaminases;  Trending down.  Check hepatitis panel.  Prior US 2/02: possible hemangioma, hepatic steatosis. No gallstone.  She will need MRI with contrast when renal function recover.    Estimated body mass index is 30.01 kg/m as calculated from the following:   Height as of this encounter: 5' 4"  (1.626 m).   Weight as of this encounter: 79.3 kg.   DVT prophylaxis: Lovenox Code Status: Full code Family Communication: Care discussed with patient.  Disposition Plan:  Status is: inpatient.     Consultants:  GI  Procedures:  None  Antimicrobials:    Subjective: She is feeling better. She hasn't been able to keep herself hydrated due to vomiting, swallowing issues. She has an appointment for endoscopy.   Objective: Vitals:   09/23/21 0530 09/23/21 0600 09/23/21 0630 09/23/21 0700  BP: (!) 86/53 100/60 95/62 91/63   Pulse: 93 96 90 96  Resp: (!) 24 (!) 22 15 (!) 21  Temp:      TempSrc:      SpO2: 100% 99% 100% 100%  Weight:      Height:        Intake/Output Summary (Last 24 hours) at 09/23/2021 0718 Last data filed at 09/23/2021 0600 Gross per 24 hour  Intake 5597.6 ml  Output 2200 ml  Net 3397.6 ml   Filed Weights   09/22/21 0655 09/22/21 1210  Weight: 74.2 kg 79.3 kg    Examination:  General exam: Appears calm and comfortable  Respiratory system: Clear to  auscultation. Respiratory effort normal. Cardiovascular system: S1 & S2 heard, RRR. No JVD, murmurs, rubs, gallops or clicks. No pedal edema. Gastrointestinal system: Abdomen is nondistended, soft and nontender. No organomegaly or masses felt. Normal bowel sounds heard. Central nervous system: Alert and oriented. No focal neurological deficits. Extremities: Symmetric 5 x 5 power. Skin: No rashes, lesions or ulcers Psychiatry: Judgement and insight appear normal. Mood & affect appropriate.     Data Reviewed: I have personally reviewed following labs and imaging  studies  CBC: Recent Labs  Lab 09/22/21 0728 09/22/21 0746  WBC 14.6*  --   NEUTROABS 6.9  --   HGB 9.3* 10.9*  HCT 30.9* 32.0*  MCV 117.9*  --   PLT 232  --    Basic Metabolic Panel: Recent Labs  Lab 09/22/21 0728 09/22/21 0746 09/22/21 1225 09/22/21 1633 09/22/21 2016 09/23/21 0027  NA 136  133* 134* 142 141 144 143  K 3.5  3.5 3.6 3.3* 3.0* 2.8* 3.1*  CL 106  104 109 114* 118* 120* 118*  CO2 <7*  <7*  --  8* 14* 17* 16*  GLUCOSE 355*  352* 358* 179* 167* 118* 139*  BUN 27*  27* 23* 24* 21* 19 18  CREATININE 2.53*  2.64* 2.70* 2.16* 2.02* 1.94* 1.87*  CALCIUM 9.0  8.9  --  8.4* 8.0* 7.8* 7.8*  MG  --   --   --   --   --  1.3*  PHOS  --   --   --   --   --  1.3*   GFR: Estimated Creatinine Clearance: 40.7 mL/min (A) (by C-G formula based on SCr of 1.87 mg/dL (H)). Liver Function Tests: Recent Labs  Lab 09/22/21 0728 09/23/21 0027  AST 123* 115*  ALT 83* 62*  ALKPHOS 145* 121  BILITOT 1.6* 1.1  PROT 7.3 6.0*  ALBUMIN 3.9 3.0*   No results for input(s): LIPASE, AMYLASE in the last 168 hours. No results for input(s): AMMONIA in the last 168 hours. Coagulation Profile: No results for input(s): INR, PROTIME in the last 168 hours. Cardiac Enzymes: No results for input(s): CKTOTAL, CKMB, CKMBINDEX, TROPONINI in the last 168 hours. BNP (last 3 results) No results for input(s): PROBNP in the last 8760 hours. HbA1C: No results for input(s): HGBA1C in the last 72 hours. CBG: Recent Labs  Lab 09/22/21 1959 09/22/21 2103 09/22/21 2201 09/22/21 2304 09/23/21 0351  GLUCAP 129* 109* 109* 106* 137*   Lipid Profile: No results for input(s): CHOL, HDL, LDLCALC, TRIG, CHOLHDL, LDLDIRECT in the last 72 hours. Thyroid Function Tests: No results for input(s): TSH, T4TOTAL, FREET4, T3FREE, THYROIDAB in the last 72 hours. Anemia Panel: No results for input(s): VITAMINB12, FOLATE, FERRITIN, TIBC, IRON, RETICCTPCT in the last 72 hours. Sepsis Labs: No results  for input(s): PROCALCITON, LATICACIDVEN in the last 168 hours.  Recent Results (from the past 240 hour(s))  MRSA Next Gen by PCR, Nasal     Status: None   Collection Time: 09/23/21  2:47 AM   Specimen: Nasal Mucosa; Nasal Swab  Result Value Ref Range Status   MRSA by PCR Next Gen NOT DETECTED NOT DETECTED Final    Comment: (NOTE) The GeneXpert MRSA Assay (FDA approved for NASAL specimens only), is one component of a comprehensive MRSA colonization surveillance program. It is not intended to diagnose MRSA infection nor to guide or monitor treatment for MRSA infections. Test performance is not FDA approved in patients less than 57 years old. Performed at [facility]  [place] [address], [place], [place] [address]          Radiology Studies: [facility] Chest Port 1 View  Result Date: 09/22/2021 CLINICAL DATA:  Short of breath EXAM: PORTABLE CHEST 1 VIEW COMPARISON:  None Available. FINDINGS: The lungs are clear and negative for focal airspace consolidation, pulmonary edema or suspicious pulmonary nodule. No pleural effusion or pneumothorax. Cardiac and mediastinal contours are within normal limits. No acute fracture or lytic or blastic osseous lesions. The visualized upper abdominal bowel gas pattern is unremarkable. IMPRESSION: Negative chest x-ray. Electronically Signed   By: Jacqulynn Cadet M.D.   On: 09/22/2021 07:41        Scheduled Meds:  Chlorhexidine Gluconate Cloth  6 each Topical Daily   famotidine  20 mg Oral BID   gabapentin  300 mg Oral TID   insulin aspart  0-9 Units Subcutaneous TID WC   insulin glargine-yfgn  12 Units Subcutaneous QHS   metoCLOPramide (REGLAN) injection  5 mg Intravenous Q6H   mometasone-formoterol  2 puff Inhalation BID   montelukast  10 mg Oral QHS   pantoprazole  40 mg Oral Daily   Continuous Infusions:  sodium chloride     sodium chloride 10 mL/hr at 09/23/21 0600   insulin Stopped (09/22/21 2222)   lactated ringers 125 mL/hr  at 09/23/21 0600   potassium PHOSPHATE IVPB (in mmol)       LOS: 0 days    Time spent: 35 minutes.     Elmarie Shiley, MD [facility]   If 7PM-7AM, please contact night-coverage [email or web address]  09/23/2021, 7:18 AM

## 2021-09-23 NOTE — Progress Notes (Signed)
Inpatient Diabetes Program Recommendations  AACE/ADA: New Consensus Statement on Inpatient Glycemic Control (2015)  Target Ranges:  Prepandial:   less than 140 mg/dL      Peak postprandial:   less than 180 mg/dL (1-2 hours)      Critically ill patients:  140 - 180 mg/dL   Lab Results  Component Value Date   GLUCAP 282 (H) 09/23/2021   HGBA1C 9.6 (H) 05/21/2021    Review of Glycemic Control  Diabetes history: DM1 Outpatient Diabetes medications: Note lists Lantus 12 units qd, Apidra 1-15 units tid meal coverage (Patient discharged from hospital 05/28/21 on Lantus 22 qd,Apidra 1-15 units tid meal coverage ) Current orders for Inpatient glycemic control: Semglee 12 units q hs, Novolog 0-9 units tid correction  Inpatient Diabetes Program Recommendations:   -Increase Semglee to 20 units qd (give additional 10 units units now) -Add Novolog 5 units tid meal coverage when eating 50% meals (noted to be NPO in am) -Add Novolog 0-5 units correction hs  Attempted to call room phone and cell phone without answer. Rosalia to RN. DM coordinator covering system call remotely.  Thank you, Anna Garcia. Heaven Meeker, RN, MSN, CDE  Diabetes Coordinator Inpatient Glycemic Control Team Team Pager [phone] (8am-5pm) 09/23/2021 3:40 PM

## 2021-09-23 NOTE — Anesthesia Preprocedure Evaluation (Signed)
Anesthesia Evaluation  Patient identified by MRN, date of birth, ID band Patient awake    Reviewed: Allergy & Precautions, NPO status , Patient's Chart, lab work & pertinent test results  Airway Mallampati: II  TM Distance: >3 FB Neck ROM: Full    Dental  (+) Poor Dentition, Dental Advisory Given, Missing, Caps,    Pulmonary asthma , Patient abstained from smoking., former smoker,    Pulmonary exam normal breath sounds clear to auscultation       Cardiovascular hypertension, Pt. on medications Normal cardiovascular exam Rhythm:Regular Rate:Normal  Echo 05/22/21 1. Left ventricular ejection fraction, by estimation, is 65 to 70%. The left ventricle has normal function. The left ventricle has no regional wall motion abnormalities. Left ventricular diastolic parameters were normal.  2. Right ventricular systolic function is normal. The right ventricular size is normal.  3. The mitral valve is normal in structure. No evidence of mitral valve regurgitation. No evidence of mitral stenosis.  4. The aortic valve is normal in structure. Aortic valve regurgitation is not visualized. No aortic stenosis is present.  5. The inferior vena cava is normal in size with greaterthan 50% respiratory variability, suggesting right atrial pressure of 3 mmHg.   EKG 05/26/21 NSR, Normal ECG   Neuro/Psych Peripheral neuropathy  Neuromuscular disease negative psych ROS   GI/Hepatic GERD  Medicated,Dysphagia Gastroparesis   Endo/Other  diabetes, Poorly Controlled, Type 1, Insulin DependentHyperlipidemia Obesity  Renal/GU Renal InsufficiencyRenal diseaseDiabetic nephropathy  negative genitourinary   Musculoskeletal  (+) Fibromyalgia -  Abdominal (+) + obese,   Peds  Hematology  (+) Blood dyscrasia, anemia , Thrombocytopenia SIRS   Anesthesia Other Findings   Reproductive/Obstetrics                           Anesthesia  Physical Anesthesia Plan  ASA: 3  Anesthesia Plan: General   Post-op Pain Management:    Induction: Intravenous, Rapid sequence and Cricoid pressure planned  PONV Risk Score and Plan: 4 or greater and Treatment may vary due to age or medical condition, Midazolam and Ondansetron  Airway Management Planned: Oral ETT  Additional Equipment: None  Intra-op Plan:   Post-operative Plan: Extubation in OR  Informed Consent: I have reviewed the patients History and Physical, chart, labs and discussed the procedure including the risks, benefits and alternatives for the proposed anesthesia with the patient or authorized representative who has indicated his/her understanding and acceptance.     Dental advisory given  Plan Discussed with: CRNA and Anesthesiologist  Anesthesia Plan Comments:        Anesthesia Quick Evaluation

## 2021-09-23 NOTE — Consult Note (Signed)
[facility] Consult  Referring Provider: Dr. Myrtie Cruise hospitalist Primary Care Physician:  Day, [name], MD Primary Gastroenterologist: [facility]/unassigned  Reason for Consultation: Dysphagia, nausea, vomiting  HPI: Anna Garcia is a 41 y.o. female who was admitted on 09/22/2021 with DKA. Patient states that she experienced fast heart rate, nausea and overall feeling of being unwell, which usually occurs when she is in DKA and decided to come to the ER for further evaluation. She has been treated with insulin infusion, bicarbonate infusion and IV fluids and GI was consulted as patient complains of difficulty swallowing. Patient has had difficulty swallowing liquids as well as solids with immediate reflux of contents associated with nausea and vomiting, at times with some blood or coffee-ground emesis.  She was supposed to have an EGD at [facility] on 10/02/2021 with balloon dilation for dysphagia. Patient denies unintentional weight loss but complains of decreased appetite and tends to eat only once a day. She is on famotidine and Dexilant at home for acid reflux and heartburn. She has history of gastroparesis. She has not had change in bowel habits and denies noticing blood in stool or black stools. She denies abdominal or rectal pain. She is a smoker, half pack per day, denies use of marijuana, denies significant alcohol use, drinks alcohol socially/occasionally.   Past Medical History:  Diagnosis Date   Diabetes mellitus    Gastroparesis    Hypertension    Neuropathy     History reviewed. No pertinent surgical history.  Prior to Admission medications   Medication Sig Start Date End Date Taking? Authorizing Provider  DEXILANT 60 MG capsule Take 60 mg by mouth daily. 05/24/19  Yes [provider]  famotidine (PEPCID) 40 MG tablet Take 40 mg by mouth 2 (two) times daily. 05/28/19  Yes [provider]  gabapentin (NEURONTIN) 300 MG capsule Take 1  capsule (300 mg total) by mouth 3 (three) times daily. 05/28/21  Yes Eugenie Filler, MD  levalbuterol [facility] HFA) 45 MCG/ACT inhaler Inhale 1 puff into the lungs every 6 (six) hours as needed for wheezing.   Yes [provider]  losartan (COZAAR) 25 MG tablet Take 25 mg by mouth daily. 08/31/21  Yes [provider]  mometasone-formoterol (DULERA) 100-5 MCG/ACT AERO Inhale 2 puffs into the lungs 2 (two) times daily. 07/07/20  Yes Mercy Riding, MD  montelukast (SINGULAIR) 10 MG tablet Take 10 mg by mouth at bedtime. 05/24/19  Yes [provider]  MOTEGRITY 1 MG TABS Take 1 mg by mouth daily. 08/31/21  Yes [provider]  rosuvastatin (CRESTOR) 20 MG tablet Take 20 mg by mouth daily. 05/24/19  Yes [provider]  acetaminophen (TYLENOL) 325 MG tablet Take 2 tablets (650 mg total) by mouth every 6 (six) hours as needed for mild pain (or Fever >/= 101). Patient not taking: Reported on 09/22/2021 05/28/21   Eugenie Filler, MD  bethanechol (URECHOLINE) 5 MG tablet Take 5 mg by mouth 3 (three) times daily. Patient not taking: Reported on 05/21/2021 08/28/20   [provider]  insulin glargine (LANTUS SOLOSTAR) 100 UNIT/ML Solostar Pen Inject 22 Units into the skin daily. Patient taking differently: Inject 12 Units into the skin at bedtime. 05/28/21   Eugenie Filler, MD  insulin glulisine (APIDRA SOLOSTAR) 100 UNIT/ML Solostar Pen Inject 1-15 Units into the skin See admin instructions. Changes as per carb intake as needed high CBG 07/13/20   [provider]  insulin aspart (NOVOLOG) 100 UNIT/ML  injection Inject 3 units three times a day with meals if you eat over 50% of your meals. In addition, use sliding scale as follows: CBG 70 - 120: 0 units CBG 121 - 150: 2 units CBG 151 - 200: 3 units CBG 201 - 250: 5 units CBG 251 - 300: 8 units CBG 301 - 350: 11 units CBG 351 - 400: 15 units CBG > 400: call MD and obtain STAT lab verification Patient not  taking: No sig reported 07/07/20 10/13/20  Mercy Riding, MD    Current Facility-Administered Medications  Medication Dose Route Frequency Provider Last Rate Last Admin   0.9 %  sodium chloride infusion  250 mL Intravenous Continuous Rise Patience, MD       0.9 %  sodium chloride infusion   Intravenous PRN Reubin Milan, MD 10 mL/hr at 09/23/21 0600 Infusion Verify at 09/23/21 0600   0.9 %  sodium chloride infusion   Intravenous Continuous Regalado, Belkys A, MD 100 mL/hr at 09/23/21 0851 New Bag at 09/23/21 0851   acetaminophen (TYLENOL) tablet 650 mg  650 mg Oral Q6H PRN Reubin Milan, MD   650 mg at 09/23/21 6440   Or   acetaminophen (TYLENOL) suppository 650 mg  650 mg Rectal Q6H PRN Reubin Milan, MD       Chlorhexidine Gluconate Cloth 2 % PADS 6 each  6 each Topical Daily Reubin Milan, MD   6 each at 09/22/21 1300   dextrose 50 % solution 0-50 mL  0-50 mL Intravenous PRN Reubin Milan, MD       famotidine (PEPCID) tablet 20 mg  20 mg Oral BID Reubin Milan, MD   20 mg at 09/23/21 0919   gabapentin (NEURONTIN) capsule 300 mg  300 mg Oral TID Reubin Milan, MD   300 mg at 09/23/21 0919   insulin aspart (novoLOG) injection 0-9 Units  0-9 Units Subcutaneous TID WC Rise Patience, MD       insulin glargine-yfgn [facility]) injection 12 Units  12 Units Subcutaneous QHS Reubin Milan, MD   12 Units at 09/22/21 2022   ipratropium-albuterol (DUONEB) 0.5-2.5 (3) MG/3ML nebulizer solution 3 mL  3 mL Nebulization Q4H PRN Reubin Milan, MD       levalbuterol Penne Lash) nebulizer solution 1.25 mg  1.25 mg Inhalation Q6H PRN Reubin Milan, MD       magnesium sulfate IVPB 2 g 50 mL  2 g Intravenous Once Regalado, Belkys A, MD 50 mL/hr at 09/23/21 0920 2 g at 09/23/21 0920   metoCLOPramide (REGLAN) injection 5 mg  5 mg Intravenous Q6H Reubin Milan, MD   5 mg at 09/23/21 0617   mometasone-formoterol (DULERA) 100-5 MCG/ACT inhaler 2  puff  2 puff Inhalation BID Reubin Milan, MD   2 puff at 09/23/21 0748   montelukast (SINGULAIR) tablet 10 mg  10 mg Oral QHS Reubin Milan, MD   10 mg at 09/22/21 2235   pantoprazole (PROTONIX) EC tablet 40 mg  40 mg Oral BID Regalado, Belkys A, MD   40 mg at 09/23/21 0919   potassium chloride SA (KLOR-CON M) CR tablet 40 mEq  40 mEq Oral Q4H Regalado, Belkys A, MD   40 mEq at 09/23/21 0919   potassium PHOSPHATE 45 mmol in dextrose 5 % 500 mL infusion  45 mmol Intravenous Once Regalado, Belkys A, MD 64.4 mL/hr at 09/23/21 0811 45 mmol at 09/23/21 0811   prochlorperazine (  COMPAZINE) injection 5 mg  5 mg Intravenous Q4H PRN Reubin Milan, MD        Allergies as of 09/22/2021   (No Known Allergies)    Family History  Problem Relation Age of Onset   Hypertension Mother    Dementia Mother    Heart attack Father    Hypertension Father    Heart disease Father    Hypertension Brother    Stroke Maternal Grandfather     Social History   Socioeconomic History   Marital status: Single    Spouse name: Not on file   Number of children: Not on file   Years of education: Not on file   Highest education level: Not on file  Occupational History   Not on file  Tobacco Use   Smoking status: Former    Packs/day: 0.50    Types: Cigarettes   Smokeless tobacco: Never  Vaping Use   Vaping Use: Never used  Substance and Sexual Activity   Alcohol use: No   Drug use: No   Sexual activity: Not on file  Other Topics Concern   Not on file  Social History Narrative   Not on file   Social Determinants of Health   Financial Resource Strain: Not on file  Food Insecurity: Not on file  Transportation Needs: Not on file  Physical Activity: Not on file  Stress: Not on file  Social Connections: Not on file  Intimate Partner Violence: Not on file    Review of Systems: Positive for: GI: Described in detail in HPI.    Gen: Denies any fever, chills, rigors, night sweats,  anorexia, fatigue, weakness, malaise, involuntary weight loss, and sleep disorder CV: Denies chest pain, angina, palpitations, syncope, orthopnea, PND, peripheral edema, and claudication. Resp: Denies dyspnea, cough, sputum, wheezing, coughing up blood. GU : Denies urinary burning, blood in urine, urinary frequency, urinary hesitancy, nocturnal urination, and urinary incontinence. MS: Denies joint pain or swelling.  Denies muscle weakness, cramps, atrophy.  Derm: Denies rash, itching, oral ulcerations, hives, unhealing ulcers.  Psych: Denies depression, anxiety, memory loss, suicidal ideation, hallucinations,  and confusion. Heme: Denies bruising, bleeding, and enlarged lymph nodes. Neuro:  Denies any headaches, dizziness, paresthesias. Endo:  DM,  denies any problems with thyroid, adrenal function.  Physical Exam: Vital signs in last 24 hours: Temp:  [98.1 F (36.7 C)-98.7 F (37.1 C)] 98.7 F (37.1 C) (06/04 0800) Pulse Rate:  [85-142] 104 (06/04 0923) Resp:  [12-30] 14 (06/04 0923) BP: (70-116)/(36-92) 102/49 (06/04 0923) SpO2:  [88 %-100 %] 99 % (06/04 0923) Weight:  [79.3 kg] 79.3 kg (06/03 1210) Last BM Date :  (PTA)  General:   Alert,  Well-developed, well-nourished, pleasant and cooperative in NAD Head:  Normocephalic and atraumatic. Eyes:  Sclera clear, no icterus.   Prominent pallor Ears:  Normal auditory acuity. Nose:  No deformity, discharge,  or lesions. Mouth:  No deformity or lesions.  Oropharynx pink & moist. Neck:  Supple; no masses or thyromegaly. Lungs:  Clear throughout to auscultation.   No wheezes, crackles, or rhonchi. No acute distress. Heart:  Regular rate and rhythm; no murmurs, clicks, rubs,  or gallops. Extremities:  Without clubbing or edema. Neurologic:  Alert and  oriented x4;  grossly normal neurologically. Skin:  Intact without significant lesions or rashes. Psych:  Alert and cooperative. Normal mood and affect. Abdomen:  Soft, nontender and  nondistended. No masses, hepatosplenomegaly or hernias noted. Normal bowel sounds, without guarding, and without rebound.  Lab Results: Recent Labs    09/22/21 0728 09/22/21 0746 09/23/21 0724  WBC 14.6*  --  5.7  HGB 9.3* 10.9* 7.7*  HCT 30.9* 32.0* 23.7*  PLT 232  --  122*   BMET Recent Labs    09/22/21 2016 09/23/21 0027 09/23/21 0724  NA 144 143 141  K 2.8* 3.1* 2.9*  CL 120* 118* 116*  CO2 17* 16* 17*  GLUCOSE 118* 139* 119*  BUN 19 18 16   CREATININE 1.94* 1.87* 1.72*  CALCIUM 7.8* 7.8* 7.9*   LFT Recent Labs    09/23/21 0027  PROT 6.0*  ALBUMIN 3.0*  AST 115*  ALT 62*  ALKPHOS 121  BILITOT 1.1   PT/INR No results for input(s): LABPROT, INR in the last 72 hours.  Studies/Results: DG Chest Port 1 View  Result Date: 09/22/2021 CLINICAL DATA:  Short of breath EXAM: PORTABLE CHEST 1 VIEW COMPARISON:  None Available. FINDINGS: The lungs are clear and negative for focal airspace consolidation, pulmonary edema or suspicious pulmonary nodule. No pleural effusion or pneumothorax. Cardiac and mediastinal contours are within normal limits. No acute fracture or lytic or blastic osseous lesions. The visualized upper abdominal bowel gas pattern is unremarkable. IMPRESSION: Negative chest x-ray. Electronically Signed   By: Jacqulynn Cadet M.D.   On: 09/22/2021 07:41    Impression: Dysphagia-solids and liquids, nonprogressive  History of gastroparesis, on bethanechol-5 mg 3 times a day  History of GERD-on famotidine 40 mg twice a day and Dexilant 60 mg daily at home  Longstanding history of diabetes DKA Macrocytic anemia, hemoglobin 7.7, MCV 109.7, mild thrombocytopenia, platelet 122 Hypokalemia, potassium 2.9 Acidosis bicarb 17 Renal impairment, creatinine 1.72, GFR 38 Slightly elevated AST 115, 8 ALT 62 with normal T. bili 1.1, normal alk phos 121, hepatitis B and hepatitis C serology negative from 2/23 Hepatic steatosis and 3.8 cm right hepatic  hemangioma on ultrasound from 05/24/2021  Plan: On carbohydrate modified diet, will keep n.p.o. postmidnight for EGD with possible balloon dilatation with Dr. Watt Climes tomorrow Continue famotidine 20 mg twice a day and pantoprazole 40 mg twice a day for now Continue metoclopramide 5 mg IV every 6 hours for history of gastroparesis Continue Compazine 5 mg IV every 4 hours as needed for nausea vomiting Electrolyte replacement for magnesium and potassium have been ordered   LOS: 0 days   Ronnette Juniper, MD  09/23/2021, 9:51 AM

## 2021-09-24 ENCOUNTER — Inpatient Hospital Stay (HOSPITAL_COMMUNITY): Payer: Medicare Other | Admitting: Anesthesiology

## 2021-09-24 ENCOUNTER — Encounter (HOSPITAL_COMMUNITY): Payer: Self-pay

## 2021-09-24 ENCOUNTER — Encounter (HOSPITAL_COMMUNITY)
Admission: EM | Disposition: A | Discharge status: Home / Self Care | Payer: Self-pay | Source: Home / Self Care | Attending: Internal Medicine

## 2021-09-24 ENCOUNTER — Inpatient Hospital Stay (HOSPITAL_COMMUNITY): Payer: Medicare Other

## 2021-09-24 DIAGNOSIS — Z794 Long term (current) use of insulin: Secondary | ICD-10-CM

## 2021-09-24 DIAGNOSIS — E1065 Type 1 diabetes mellitus with hyperglycemia: Secondary | ICD-10-CM

## 2021-09-24 DIAGNOSIS — K222 Esophageal obstruction: Secondary | ICD-10-CM

## 2021-09-24 DIAGNOSIS — K449 Diaphragmatic hernia without obstruction or gangrene: Secondary | ICD-10-CM

## 2021-09-24 DIAGNOSIS — Z87891 Personal history of nicotine dependence: Secondary | ICD-10-CM

## 2021-09-24 DIAGNOSIS — E0810 Diabetes mellitus due to underlying condition with ketoacidosis without coma: Secondary | ICD-10-CM | POA: Diagnosis not present

## 2021-09-24 DIAGNOSIS — I10 Essential (primary) hypertension: Secondary | ICD-10-CM

## 2021-09-24 HISTORY — PX: SAVORY DILATION: SHX5439

## 2021-09-24 HISTORY — PX: ESOPHAGOGASTRODUODENOSCOPY (EGD) WITH PROPOFOL: SHX5813

## 2021-09-24 LAB — BASIC METABOLIC PANEL
Anion gap: 5 (ref 5–15)
Anion gap: 7 (ref 5–15)
BUN: 9 mg/dL (ref 6–20)
BUN: 8 mg/dL (ref 6–20)
CO2: 15 mmol/L — ABNORMAL LOW (ref 22–32)
CO2: 13 mmol/L — ABNORMAL LOW (ref 22–32)
Calcium: 7.2 mg/dL — ABNORMAL LOW (ref 8.9–10.3)
Calcium: 7.7 mg/dL — ABNORMAL LOW (ref 8.9–10.3)
Chloride: 124 mmol/L — ABNORMAL HIGH (ref 98–111)
Chloride: 123 mmol/L — ABNORMAL HIGH (ref 98–111)
Creatinine, Ser: 1.43 mg/dL — ABNORMAL HIGH (ref 0.44–1.00)
Creatinine, Ser: 1.45 mg/dL — ABNORMAL HIGH (ref 0.44–1.00)
GFR, Estimated: 48 mL/min — ABNORMAL LOW (ref >60)
GFR, Estimated: 47 mL/min — ABNORMAL LOW (ref >60)
Glucose, Bld: 195 mg/dL — ABNORMAL HIGH (ref 70–99)
Glucose, Bld: 220 mg/dL — ABNORMAL HIGH (ref 70–99)
Potassium: 3.3 mmol/L — ABNORMAL LOW (ref 3.5–5.1)
Potassium: 3.6 mmol/L (ref 3.5–5.1)
Sodium: 144 mmol/L (ref 135–145)
Sodium: 143 mmol/L (ref 135–145)

## 2021-09-24 LAB — CBC
HCT: 23.5 % — ABNORMAL LOW (ref 36.0–46.0)
HCT: 28.1 % — ABNORMAL LOW (ref 36.0–46.0)
Hemoglobin: 7.3 g/dL — ABNORMAL LOW (ref 12.0–15.0)
Hemoglobin: 8.7 g/dL — ABNORMAL LOW (ref 12.0–15.0)
MCH: 35.1 pg — ABNORMAL HIGH (ref 26.0–34.0)
MCH: 33.9 pg (ref 26.0–34.0)
MCHC: 31.1 g/dL (ref 30.0–36.0)
MCHC: 31 g/dL (ref 30.0–36.0)
MCV: 113 fL — ABNORMAL HIGH (ref 80.0–100.0)
MCV: 109.3 fL — ABNORMAL HIGH (ref 80.0–100.0)
Platelets: UNDETERMINED 10*3/uL (ref 150–400)
Platelets: 99 10*3/uL — ABNORMAL LOW (ref 150–400)
RBC: 2.08 MIL/uL — ABNORMAL LOW (ref 3.87–5.11)
RBC: 2.57 MIL/uL — ABNORMAL LOW (ref 3.87–5.11)
RDW: 16.9 % — ABNORMAL HIGH (ref 11.5–15.5)
RDW: 19.3 % — ABNORMAL HIGH (ref 11.5–15.5)
WBC: 4 10*3/uL (ref 4.0–10.5)
WBC: 4 10*3/uL (ref 4.0–10.5)
nRBC: 0 % (ref 0.0–0.2)
nRBC: 0 % (ref 0.0–0.2)

## 2021-09-24 LAB — PHOSPHORUS: Phosphorus: 2.4 mg/dL — ABNORMAL LOW (ref 2.5–4.6)

## 2021-09-24 LAB — GLUCOSE, CAPILLARY
Glucose-Capillary: 147 mg/dL — ABNORMAL HIGH (ref 70–99)
Glucose-Capillary: 185 mg/dL — ABNORMAL HIGH (ref 70–99)
Glucose-Capillary: 185 mg/dL — ABNORMAL HIGH (ref 70–99)
Glucose-Capillary: 193 mg/dL — ABNORMAL HIGH (ref 70–99)
Glucose-Capillary: 187 mg/dL — ABNORMAL HIGH (ref 70–99)
Glucose-Capillary: 241 mg/dL — ABNORMAL HIGH (ref 70–99)

## 2021-09-24 LAB — COMPREHENSIVE METABOLIC PANEL
ALT: 57 U/L — ABNORMAL HIGH (ref 0–44)
AST: 100 U/L — ABNORMAL HIGH (ref 15–41)
Albumin: 3 g/dL — ABNORMAL LOW (ref 3.5–5.0)
Alkaline Phosphatase: 124 U/L (ref 38–126)
Anion gap: 7 (ref 5–15)
BUN: 10 mg/dL (ref 6–20)
CO2: 13 mmol/L — ABNORMAL LOW (ref 22–32)
Calcium: 7.9 mg/dL — ABNORMAL LOW (ref 8.9–10.3)
Chloride: 122 mmol/L — ABNORMAL HIGH (ref 98–111)
Creatinine, Ser: 1.6 mg/dL — ABNORMAL HIGH (ref 0.44–1.00)
GFR, Estimated: 42 mL/min — ABNORMAL LOW (ref >60)
Glucose, Bld: 192 mg/dL — ABNORMAL HIGH (ref 70–99)
Potassium: 3.5 mmol/L (ref 3.5–5.1)
Sodium: 142 mmol/L (ref 135–145)
Total Bilirubin: 0.5 mg/dL (ref 0.3–1.2)
Total Protein: 5.7 g/dL — ABNORMAL LOW (ref 6.5–8.1)

## 2021-09-24 LAB — PREPARE RBC (CROSSMATCH)

## 2021-09-24 LAB — MAGNESIUM: Magnesium: 1.8 mg/dL (ref 1.7–2.4)

## 2021-09-24 LAB — BETA-HYDROXYBUTYRIC ACID: Beta-Hydroxybutyric Acid: 1.32 mmol/L — ABNORMAL HIGH (ref 0.05–0.27)

## 2021-09-24 LAB — ABO/RH: ABO/RH(D): O POS

## 2021-09-24 SURGERY — ESOPHAGOGASTRODUODENOSCOPY (EGD) WITH PROPOFOL
Anesthesia: General

## 2021-09-24 MED ORDER — MIDAZOLAM HCL 2 MG/2ML IJ SOLN
INTRAMUSCULAR | Status: AC
  Filled 2021-09-24: qty 2

## 2021-09-24 MED ORDER — INSULIN ASPART 100 UNIT/ML IJ SOLN: 4.0000 [IU] | Freq: Three times a day (TID) | INTRAMUSCULAR | Status: DC

## 2021-09-24 MED ORDER — DEXTROSE 50 % IV SOLN: 0.0000 mL | INTRAVENOUS | Status: DC | PRN

## 2021-09-24 MED ORDER — POTASSIUM CHLORIDE 20 MEQ PO PACK: 40.0000 meq | PACK | Freq: Once | ORAL | Status: DC

## 2021-09-24 MED ORDER — PHENOL 1.4 % MT LIQD
1.0000 | OROMUCOSAL | Status: DC | PRN
  Administered 2021-09-24: 1 via OROMUCOSAL

## 2021-09-24 MED ORDER — SODIUM CHLORIDE 0.9% IV SOLUTION
Freq: Once | INTRAVENOUS | Status: AC
Start: 2021-09-24 — End: 2021-09-24

## 2021-09-24 MED ORDER — POTASSIUM CHLORIDE CRYS ER 20 MEQ PO TBCR
40.0000 meq | EXTENDED_RELEASE_TABLET | Freq: Once | ORAL | Status: AC
  Administered 2021-09-24: 40 meq via ORAL
  Filled 2021-09-24: qty 2

## 2021-09-24 MED ORDER — DEXTROSE IN LACTATED RINGERS 5 % IV SOLN: INTRAVENOUS | Status: DC

## 2021-09-24 MED ORDER — VITAMIN D (ERGOCALCIFEROL) 1.25 MG (50000 UNIT) PO CAPS
50000.0000 [IU] | ORAL_CAPSULE | ORAL | Status: DC
  Administered 2021-09-25: 50000 [IU] via ORAL
  Filled 2021-09-24: qty 1

## 2021-09-24 MED ORDER — SUCCINYLCHOLINE CHLORIDE 200 MG/10ML IV SOSY
PREFILLED_SYRINGE | INTRAVENOUS | Status: DC | PRN
  Administered 2021-09-24: 120 mg via INTRAVENOUS

## 2021-09-24 MED ORDER — PROPOFOL 1000 MG/100ML IV EMUL
INTRAVENOUS | Status: AC
  Filled 2021-09-24: qty 100

## 2021-09-24 MED ORDER — PROPOFOL 10 MG/ML IV BOLUS
INTRAVENOUS | Status: DC | PRN
  Administered 2021-09-24: 40 mg via INTRAVENOUS
  Administered 2021-09-24: 160 mg via INTRAVENOUS

## 2021-09-24 MED ORDER — LACTATED RINGERS IV SOLN: INTRAVENOUS | Status: DC

## 2021-09-24 MED ORDER — ONDANSETRON HCL 4 MG/2ML IJ SOLN
INTRAMUSCULAR | Status: DC | PRN
  Administered 2021-09-24: 4 mg via INTRAVENOUS

## 2021-09-24 MED ORDER — SODIUM BICARBONATE 650 MG PO TABS
1300.0000 mg | ORAL_TABLET | Freq: Three times a day (TID) | ORAL | Status: DC
  Administered 2021-09-24 – 2021-09-26 (×5): 1300 mg via ORAL
  Filled 2021-09-24 (×5): qty 2

## 2021-09-24 MED ORDER — INSULIN ASPART 100 UNIT/ML IJ SOLN
0.0000 [IU] | Freq: Every day | INTRAMUSCULAR | Status: DC
  Administered 2021-09-24 – 2021-09-25 (×2): 2 [IU] via SUBCUTANEOUS

## 2021-09-24 MED ORDER — INSULIN ASPART 100 UNIT/ML IJ SOLN
0.0000 [IU] | Freq: Three times a day (TID) | INTRAMUSCULAR | Status: DC
  Administered 2021-09-24 – 2021-09-26 (×5): 2 [IU] via SUBCUTANEOUS

## 2021-09-24 MED ORDER — SODIUM CHLORIDE 0.9 % IV BOLUS: 250.0000 mL | Freq: Once | INTRAVENOUS | Status: DC

## 2021-09-24 MED ORDER — POTASSIUM CHLORIDE 10 MEQ/100ML IV SOLN: 10.0000 meq | INTRAVENOUS | Status: DC

## 2021-09-24 MED ORDER — GUAIFENESIN 100 MG/5ML PO LIQD
5.0000 mL | ORAL | Status: DC | PRN
Start: 2021-09-24 — End: 2021-09-26
  Administered 2021-09-24: 5 mL via ORAL

## 2021-09-24 MED ORDER — SODIUM CHLORIDE 0.9 % IV BOLUS
1000.0000 mL | Freq: Once | INTRAVENOUS | Status: AC
  Administered 2021-09-24: 1000 mL via INTRAVENOUS

## 2021-09-24 MED ORDER — INSULIN ASPART 100 UNIT/ML IJ SOLN
3.0000 [IU] | Freq: Three times a day (TID) | INTRAMUSCULAR | Status: DC
  Administered 2021-09-25 – 2021-09-26 (×3): 3 [IU] via SUBCUTANEOUS

## 2021-09-24 MED ORDER — INSULIN GLARGINE-YFGN 100 UNIT/ML ~~LOC~~ SOLN
20.0000 [IU] | Freq: Every day | SUBCUTANEOUS | Status: DC
  Administered 2021-09-24 – 2021-09-25 (×2): 20 [IU] via SUBCUTANEOUS
  Filled 2021-09-24 (×3): qty 0.2

## 2021-09-24 MED ORDER — POTASSIUM PHOSPHATES 15 MMOLE/5ML IV SOLN
15.0000 mmol | Freq: Once | INTRAVENOUS | Status: AC
Start: 2021-09-24 — End: 2021-09-24
  Administered 2021-09-24: 15 mmol via INTRAVENOUS
  Filled 2021-09-24: qty 5

## 2021-09-24 MED ORDER — SODIUM CHLORIDE 0.9 % IV BOLUS
250.0000 mL | Freq: Once | INTRAVENOUS | Status: AC
  Administered 2021-09-24: 250 mL via INTRAVENOUS

## 2021-09-24 MED ORDER — LACTATED RINGERS IV BOLUS
500.0000 mL | Freq: Once | INTRAVENOUS | Status: AC
  Administered 2021-09-24: 500 mL via INTRAVENOUS

## 2021-09-24 MED ORDER — INSULIN REGULAR(HUMAN) IN NACL 100-0.9 UT/100ML-% IV SOLN
INTRAVENOUS | Status: DC
Start: 2021-09-24 — End: 2021-09-24

## 2021-09-24 MED ORDER — LIDOCAINE 2% (20 MG/ML) 5 ML SYRINGE
INTRAMUSCULAR | Status: DC | PRN
  Administered 2021-09-24: 60 mg via INTRAVENOUS

## 2021-09-24 MED ORDER — MIDAZOLAM HCL 5 MG/5ML IJ SOLN
INTRAMUSCULAR | Status: DC | PRN
  Administered 2021-09-24: 2 mg via INTRAVENOUS

## 2021-09-24 SURGICAL SUPPLY — 15 items

## 2021-09-24 NOTE — TOC Progression Note (Signed)
Transition of Care (TOC) - Progression Note   Transition of Care [facility]) Screening Note  Patient Details  Name: Anna Garcia Date of Birth: 01-Oct-1980  Transition of Care [facility] CM/SW Contact:    Sherie Don, LCSW Phone Number: 09/24/2021, 1:15 PM  Transition of Care Department [facility]) has reviewed patient and no TOC needs have been identified at this time. We will continue to monitor patient advancement through interdisciplinary progression rounds. If new patient transition needs arise, please place a TOC consult.  Barriers to Discharge: Continued Medical Work up  Readmission Risk Interventions    09/24/2021    1:14 PM  Readmission Risk Prevention Plan  HRI or [place] Complete  Social Work Consult for [place] Planning/Counseling Complete  Palliative Care Screening Not Applicable

## 2021-09-24 NOTE — Plan of Care (Signed)
  Problem: Metabolic: Goal: Ability to maintain appropriate glucose levels will improve Outcome: Progressing   Problem: Nutritional: Goal: Maintenance of adequate nutrition will improve Outcome: Progressing   Problem: Skin Integrity: Goal: Risk for impaired skin integrity will decrease Outcome: Progressing   Problem: Education: Goal: Knowledge of General Education information will improve Description: Including pain rating scale, medication(s)/side effects and non-pharmacologic comfort measures Outcome: Progressing   Problem: Clinical Measurements: Goal: Cardiovascular complication will be avoided Outcome: Progressing   Problem: Activity: Goal: Risk for activity intolerance will decrease Outcome: Progressing   Problem: Nutrition: Goal: Adequate nutrition will be maintained Outcome: Progressing   Problem: Coping: Goal: Level of anxiety will decrease Outcome: Progressing   Problem: Pain Managment: Goal: General experience of comfort will improve Outcome: Progressing   Problem: Skin Integrity: Goal: Risk for impaired skin integrity will decrease Outcome: Progressing

## 2021-09-24 NOTE — Progress Notes (Signed)
Pt received 1L bolus of NS and 1 unit of blood today.Pressures remain soft. MD aware. Will continue to monitor.

## 2021-09-24 NOTE — Transfer of Care (Signed)
Immediate Anesthesia Transfer of Care Note  Patient: Anna Garcia  Procedure(s) Performed: ESOPHAGOGASTRODUODENOSCOPY (EGD) WITH PROPOFOL SAVORY DILATION - 16 MM  Patient Location: PACU  Anesthesia Type:General  Level of Consciousness: sedated  Airway & Oxygen Therapy: Patient Spontanous Breathing and Patient connected to face mask oxygen  Post-op Assessment: Report given to RN and Post -op Vital signs reviewed and stable  Post vital signs: Reviewed and stable  Last Vitals:  Vitals Value Taken Time  BP    Temp    Pulse    Resp    SpO2      Last Pain:  Vitals:   09/24/21 0735  TempSrc: Temporal  PainSc: 5          Complications: No notable events documented.

## 2021-09-24 NOTE — Anesthesia Postprocedure Evaluation (Signed)
Anesthesia Post Note  Patient: Anna Garcia  Procedure(s) Performed: ESOPHAGOGASTRODUODENOSCOPY (EGD) WITH PROPOFOL SAVORY DILATION - 16 MM     Patient location during evaluation: PACU Anesthesia Type: General Level of consciousness: awake and alert and oriented Pain management: pain level controlled Vital Signs Assessment: post-procedure vital signs reviewed and stable Respiratory status: spontaneous breathing, nonlabored ventilation and respiratory function stable Cardiovascular status: blood pressure returned to baseline and stable Postop Assessment: no apparent nausea or vomiting Anesthetic complications: no   No notable events documented.  Last Vitals:  Vitals:   09/24/21 0910 09/24/21 0920  BP: 97/60   Pulse: (!) 102 96  Resp: 16 16  Temp:    SpO2: 97% 97%    Last Pain:  Vitals:   09/24/21 0920  TempSrc:   PainSc: 0-No pain                 Kalab Camps A.

## 2021-09-24 NOTE — Addendum Note (Signed)
Addendum  created 09/24/21 1011 by Talbot Grumbling, CRNA   Intraprocedure Meds edited

## 2021-09-24 NOTE — Anesthesia Procedure Notes (Signed)
Procedure Name: Intubation Date/Time: 09/24/2021 8:31 AM Performed by: Talbot Grumbling, CRNA Pre-anesthesia Checklist: Patient identified, Emergency Drugs available, Suction available and Patient being monitored Patient Re-evaluated:Patient Re-evaluated prior to induction Oxygen Delivery Method: Circle system utilized Preoxygenation: Pre-oxygenation with 100% oxygen Induction Type: IV induction, Rapid sequence and Cricoid Pressure applied Laryngoscope Size: Mac and 3 Grade View: Grade I Tube type: Oral Tube size: 7.5 mm Number of attempts: 1 Airway Equipment and Method: Stylet Placement Confirmation: ETT inserted through vocal cords under direct vision, positive ETCO2 and breath sounds checked- equal and bilateral Secured at: 21 cm Tube secured with: Tape Dental Injury: Teeth and Oropharynx as per pre-operative assessment

## 2021-09-24 NOTE — Progress Notes (Signed)
Anna Garcia 8:23 AM  Subjective: Patient seen and examined and discussed with my partner Dr. Therisa Doyne and she has no new complaints and her case discussed with the hospital team as well and we discussed her swallowing issues and she has not had a barium swallow in years and she has no other complaints  Objective: Vital signs stable afebrile no acute distress exam please see preassessment evaluation labs reviewed a.m. bicarb a little improved creatinine a little improved chronic anemia increased MCV not iron deficient  Assessment: Dysphagia  Plan: The risk benefits of endoscopy was discussed including dilation and we answered all of her questions and will proceed today with anesthesia assistance  [facility], [facility], [facility]  office [phone] After 5PM or if no answer call [phone]

## 2021-09-24 NOTE — Op Note (Signed)
[facility] Patient Name: Anna Garcia Procedure Date: 09/24/2021 MRN: [id number] Attending MD: Clarene Essex , MD Date of Birth: [birth year] CSN: [id number] Age: 41 Admit Type: Outpatient Procedure:                Upper GI endoscopy Indications:              Dysphagia Providers:                Clarene Essex, MD, Dulcy Fanny, Despina Pole,                            Technician, Gertie Exon, CRNA Referring MD:              Medicines:                General Anesthesia Complications:            No immediate complications. Estimated Blood Loss:     Estimated blood loss: none. Procedure:                Pre-Anesthesia Assessment:                           - Prior to the procedure, a History and Physical                            was performed, and patient medications and                            allergies were reviewed. The patient's tolerance of                            previous anesthesia was also reviewed. The risks                            and benefits of the procedure and the sedation                            options and risks were discussed with the patient.                            All questions were answered, and informed consent                            was obtained. Prior Anticoagulants: The patient has                            taken no previous anticoagulant or antiplatelet                            agents. ASA Grade Assessment: III - A patient with                            severe systemic disease. After reviewing the risks                            and  benefits, the patient was deemed in                            satisfactory condition to undergo the procedure.                           After obtaining informed consent, the endoscope was                            passed under direct vision. Throughout the                            procedure, the patient's blood pressure, pulse, and                            oxygen saturations were monitored  continuously. The                            [id number]-[id number] ([id number]) Olympus endoscope was introduced                            through the mouth, and advanced to the third part                            of duodenum. The upper GI endoscopy was                            accomplished without difficulty. The patient                            tolerated the procedure well. Scope In: Scope Out: Findings:      A small hiatal hernia was present.      no benign-appearing, intrinsic stenosis was found. The esophagus was       traversed. A guidewire was placed and the scope was withdrawn. Dilation       was performed with a Savary dilator with no resistance and no heme at 16       mm.      The entire examined stomach was normal. Except minimal gastritis      The duodenal bulb, first portion of the duodenum, second portion of the       duodenum and third portion of the duodenum were normal.      The exam was otherwise without abnormality. Impression:               - Small hiatal hernia.                           - Benign-appearing esophageal stenosis. Dilated.                           - Normal stomach. Except minimal gastritis                           - Normal duodenal bulb, first portion of the  duodenum, second portion of the duodenum and third                            portion of the duodenum.                           - The examination was otherwise normal.                           - No specimens collected. Moderate Sedation:      Not Applicable - Patient had care per Anesthesia. Recommendation:           - Advance diet as tolerated today.                           - Continue present medications.                           - Return to GI clinic PRN.                           - Telephone GI clinic if symptomatic PRN. Please                            call us if we can be of any further assistance with                            this hospital stay Procedure  Code(s):        --- Professional ---                           [phone], Esophagogastroduodenoscopy, flexible,                            transoral; with insertion of guide wire followed by                            passage of dilator(s) through esophagus over guide                            wire Diagnosis Code(s):        --- Professional ---                           K44.9, Diaphragmatic hernia without obstruction or                            gangrene                           K22.2, Esophageal obstruction                           R13.10, Dysphagia, unspecified CPT copyright 2019 [employer]. All rights reserved. The codes documented in this report are preliminary and upon coder review may  be revised to meet current compliance requirements. Clarene Essex, MD 09/24/2021  9:14:52 AM This report has been signed electronically. Number of Addenda: 0

## 2021-09-24 NOTE — Progress Notes (Signed)
PROGRESS NOTE    Anna Garcia  FOY:[id number] DOB: [birth year] DOA: 09/22/2021 PCP: [name], MD   Brief Narrative: 41 year old with past medical history significant for type 1 diabetes, gastroparesis, hypertension, diabetic peripheral neuropathy, overweight, tobacco use, stage IIIa CKD who presents complaining of hyperglycemia, dyspnea, nausea and vomiting.  She has not missed any of her insulin.  Patient was found to be in DKA with a CO2 less than 7, tachycardic, tachypneic.  pH venous blood gas less than 6.9.  Anion gap 20.  Creatinine 2.6, prior creatinine range 1.3.     Assessment & Plan:   Principal Problem:   DKA (diabetic ketoacidosis) ([place]) Active Problems:   SIRS (systemic inflammatory response syndrome) (HCC)   Asthma   AKI (acute kidney injury) ([place])   Transaminitis   Hypertension   Gastroparesis   CKD stage G3a/A3, GFR 45-59 and albumin creatinine ratio >300 mg/g (HCC)   Hypomagnesemia   Hypophosphatemia  1- DKA, Diabetes Type 1:  -Presented with Bicarb less than 7, CBG 355, PH 6.9, anion gap 20, Beta hydroxybutyric acid more than 8.  -Work Up for infection: Negative chest x ray, UA;  Negative nitrates, WBC 0-5. -Treated with bicarb gtt, IV fluids, Insulin Gtt.  -She was transition to long acting insulin. Gap closed. Bicarb fluctuates.  -Increase semglee to 20 units daily, 3 units meal coverage, SSI.    2-Hypotension;  Hypotension today post endoscopy. Improved with 1 L IV fluids.     Recurrent Nausea, vomiting, esophageal dysphagia. Esophageal stenosis.  She is suppose to get Endoscopy out patient.  She presented with dehydration.  GI consulted. Underwent endoscopy find to have esophageal stenosis. S/P dilation.  Continue with PPI  Increase anion gap metabolic acidosis.  In setting DKA, dehydration.  Continue with IV fluids.  Received bicarb gtt Bicarb fluctuates. Last admission for DKA she required oral Bicarb.  Will start oral bicarb.   AKI on  CKD stage IIIa;  Prior creatinene range 1.3 Presents with Cr 2.6 Likely related to hypovolemia.  Continue with IV fluids.  Improving cr down to 1.4  Anemia;  Might be anemia of chronic diseases and acute illness.  Will proceed with one unit PRBC Check RUQ [place] to follow up on hemangioma.   Hypokalemia; replaced orally.    Hypophosphatemia; replete IV  Hypomagnesemia:  Replaced IV>  Transaminases;  Trending down.  Check hepatitis panel.  Prior US 2/02: possible hemangioma, hepatic steatosis. No gallstone.  She will need MRI with contrast when renal function recover.     Estimated body mass index is 30.04 kg/m as calculated from the following:   Height as of this encounter: '5\' 4"'$  (1.626 m).   Weight as of this encounter: 79.4 kg.   DVT prophylaxis: Lovenox Code Status: Full code Family Communication: Care discussed with patient.  Disposition Plan:  Status is: inpatient.     Consultants:  GI  Procedures:  None  Antimicrobials:    Subjective: She came from endoscopy, denies melena, blood in the stool.   Objective: Vitals:   09/24/21 0500 09/24/21 0600 09/24/21 0700 09/24/21 0735  BP: (!) 90/48 92/65 100/61 (!) 98/57  Pulse: 86 89 96 92  Resp: '16 18 19 20  '$ Temp:    98 F (36.7 C)  TempSrc:    Temporal  SpO2: 98% 100% 96% 100%  Weight:    79.4 kg  Height:    '5\' 4"'$  (1.626 m)    Intake/Output Summary (Last 24 hours) at 09/24/2021 0743 Last  data filed at 09/24/2021 0648 Gross per 24 hour  Intake 3465.34 ml  Output 4300 ml  Net -834.66 ml    Filed Weights   09/22/21 0655 09/22/21 1210 09/24/21 0735  Weight: 74.2 kg 79.3 kg 79.4 kg    Examination:  General exam: NAD Respiratory system: CTA Cardiovascular system: S 1, S 2 RRR Gastrointestinal system: BS present, soft, nt Central nervous system: alert Extremities: no edema  Data Reviewed: I have personally reviewed following labs and imaging studies  CBC: Recent Labs  Lab 09/22/21 0728  09/22/21 0746 09/23/21 0724 09/24/21 0319  WBC 14.6*  --  5.7 4.0  NEUTROABS 6.9  --   --   --   HGB 9.3* 10.9* 7.7* 7.3*  HCT 30.9* 32.0* 23.7* 23.5*  MCV 117.9*  --  109.7* 113.0*  PLT 232  --  122* PLATELET CLUMPS NOTED ON SMEAR, UNABLE TO ESTIMATE    Basic Metabolic Panel: Recent Labs  Lab 09/22/21 2016 09/23/21 0027 09/23/21 0724 09/23/21 1831 09/24/21 0319  NA 144 143 141 139 142  K 2.8* 3.1* 2.9* 3.8 3.5  CL 120* 118* 116* 117* 122*  CO2 17* 16* 17* 13* 13*  GLUCOSE 118* 139* 119* 287* 192*  BUN '19 18 16 12 10  '$ CREATININE 1.94* 1.87* 1.72* 1.78* 1.60*  CALCIUM 7.8* 7.8* 7.9* 7.6* 7.9*  MG  --  1.3* 1.8  --  1.8  PHOS  --  1.3* 1.2* 3.0 2.4*    GFR: Estimated Creatinine Clearance: 47.7 mL/min (A) (by C-G formula based on SCr of 1.6 mg/dL (H)). Liver Function Tests: Recent Labs  Lab 09/22/21 0728 09/23/21 0027 09/24/21 0319  AST 123* 115* 100*  ALT 83* 62* 57*  ALKPHOS 145* 121 124  BILITOT 1.6* 1.1 0.5  PROT 7.3 6.0* 5.7*  ALBUMIN 3.9 3.0* 3.0*    No results for input(s): LIPASE, AMYLASE in the last 168 hours. No results for input(s): AMMONIA in the last 168 hours. Coagulation Profile: No results for input(s): INR, PROTIME in the last 168 hours. Cardiac Enzymes: No results for input(s): CKTOTAL, CKMB, CKMBINDEX, TROPONINI in the last 168 hours. BNP (last 3 results) No results for input(s): PROBNP in the last 8760 hours. HbA1C: No results for input(s): HGBA1C in the last 72 hours. CBG: Recent Labs  Lab 09/23/21 1239 09/23/21 1720 09/23/21 2059 09/23/21 2149 09/24/21 0741  GLUCAP 282* 257* 255* 262* 185*    Lipid Profile: No results for input(s): CHOL, HDL, LDLCALC, TRIG, CHOLHDL, LDLDIRECT in the last 72 hours. Thyroid Function Tests: No results for input(s): TSH, T4TOTAL, FREET4, T3FREE, THYROIDAB in the last 72 hours. Anemia Panel: Recent Labs    09/23/21 0724  VITAMINB12 619  FOLATE 7.1  FERRITIN 119  TIBC 245*  IRON 60   RETICCTPCT 1.5   Sepsis Labs: No results for input(s): PROCALCITON, LATICACIDVEN in the last 168 hours.  Recent Results (from the past 240 hour(s))  MRSA Next Gen by PCR, Nasal     Status: None   Collection Time: 09/23/21  2:47 AM   Specimen: Nasal Mucosa; Nasal Swab  Result Value Ref Range Status   MRSA by PCR Next Gen NOT DETECTED NOT DETECTED Final    Comment: (NOTE) The GeneXpert MRSA Assay (FDA approved for NASAL specimens only), is one component of a comprehensive MRSA colonization surveillance program. It is not intended to diagnose MRSA infection nor to guide or monitor treatment for MRSA infections. Test performance is not FDA approved in patients less than 2  years old. Performed at [facility], [place] [address], [place], [place] [address]           Radiology Studies: No results found.      Scheduled Meds:  [MAR Hold] Chlorhexidine Gluconate Cloth  6 each Topical Daily   [MAR Hold] famotidine  20 mg Oral BID   [MAR Hold] gabapentin  300 mg Oral TID   [MAR Hold] metoCLOPramide (REGLAN) injection  5 mg Intravenous Q6H   [MAR Hold] mometasone-formoterol  2 puff Inhalation BID   [MAR Hold] montelukast  10 mg Oral QHS   [MAR Hold] pantoprazole  40 mg Oral BID   Continuous Infusions:  sodium chloride     [MAR Hold] sodium chloride Stopped (09/23/21 0619)   sodium chloride 100 mL/hr at 09/24/21 0637   sodium chloride     dextrose 5% lactated ringers     insulin     lactated ringers     lactated ringers     [MAR Hold] potassium chloride     [MAR Hold] potassium PHOSPHATE IVPB (in mmol)     [MAR Hold] sodium chloride       LOS: 1 day    Time spent: 35 minutes.     Elmarie Shiley, MD [facility]   If 7PM-7AM, please contact night-coverage [email or web address]  09/24/2021, 7:43 AM

## 2021-09-25 ENCOUNTER — Encounter (HOSPITAL_COMMUNITY): Payer: Self-pay | Admitting: Gastroenterology

## 2021-09-25 DIAGNOSIS — E0810 Diabetes mellitus due to underlying condition with ketoacidosis without coma: Secondary | ICD-10-CM | POA: Diagnosis not present

## 2021-09-25 LAB — GASTROINTESTINAL PANEL BY PCR, STOOL (REPLACES STOOL CULTURE)

## 2021-09-25 LAB — BASIC METABOLIC PANEL
Anion gap: 8 (ref 5–15)
Anion gap: 6 (ref 5–15)
BUN: 6 mg/dL (ref 6–20)
BUN: <5 mg/dL — ABNORMAL LOW (ref 6–20)
CO2: 13 mmol/L — ABNORMAL LOW (ref 22–32)
CO2: 19 mmol/L — ABNORMAL LOW (ref 22–32)
Calcium: 7.9 mg/dL — ABNORMAL LOW (ref 8.9–10.3)
Calcium: 7.8 mg/dL — ABNORMAL LOW (ref 8.9–10.3)
Chloride: 121 mmol/L — ABNORMAL HIGH (ref 98–111)
Chloride: 116 mmol/L — ABNORMAL HIGH (ref 98–111)
Creatinine, Ser: 1.35 mg/dL — ABNORMAL HIGH (ref 0.44–1.00)
Creatinine, Ser: 1.27 mg/dL — ABNORMAL HIGH (ref 0.44–1.00)
GFR, Estimated: 51 mL/min — ABNORMAL LOW (ref >60)
GFR, Estimated: 55 mL/min — ABNORMAL LOW (ref >60)
Glucose, Bld: 243 mg/dL — ABNORMAL HIGH (ref 70–99)
Glucose, Bld: 215 mg/dL — ABNORMAL HIGH (ref 70–99)
Potassium: 4.1 mmol/L (ref 3.5–5.1)
Potassium: 3.6 mmol/L (ref 3.5–5.1)
Sodium: 142 mmol/L (ref 135–145)
Sodium: 141 mmol/L (ref 135–145)

## 2021-09-25 LAB — CBC
HCT: 28 % — ABNORMAL LOW (ref 36.0–46.0)
Hemoglobin: 8.6 g/dL — ABNORMAL LOW (ref 12.0–15.0)
MCH: 33.5 pg (ref 26.0–34.0)
MCHC: 30.7 g/dL (ref 30.0–36.0)
MCV: 108.9 fL — ABNORMAL HIGH (ref 80.0–100.0)
Platelets: 114 K/uL — ABNORMAL LOW (ref 150–400)
RBC: 2.57 MIL/uL — ABNORMAL LOW (ref 3.87–5.11)
RDW: 19.4 % — ABNORMAL HIGH (ref 11.5–15.5)
WBC: 4.4 K/uL (ref 4.0–10.5)
nRBC: 0 % (ref 0.0–0.2)

## 2021-09-25 LAB — TYPE AND SCREEN
ABO/RH(D): O POS
Antibody Screen: NEGATIVE
Unit division: 0

## 2021-09-25 LAB — BPAM RBC
Blood Product Expiration Date: 202306262359
ISSUE DATE / TIME: 202306051421
Unit Type and Rh: 5100

## 2021-09-25 LAB — GLUCOSE, CAPILLARY
Glucose-Capillary: 193 mg/dL — ABNORMAL HIGH (ref 70–99)
Glucose-Capillary: 188 mg/dL — ABNORMAL HIGH (ref 70–99)
Glucose-Capillary: 106 mg/dL — ABNORMAL HIGH (ref 70–99)
Glucose-Capillary: 211 mg/dL — ABNORMAL HIGH (ref 70–99)

## 2021-09-25 MED ORDER — LOPERAMIDE HCL 2 MG PO CAPS
2.0000 mg | ORAL_CAPSULE | ORAL | Status: DC | PRN
Start: 2021-09-25 — End: 2021-09-26

## 2021-09-25 MED ORDER — STERILE WATER FOR INJECTION IV SOLN
INTRAVENOUS | Status: DC
  Filled 2021-09-25: qty 1000
  Filled 2021-09-25: qty 150
  Filled 2021-09-25: qty 1000

## 2021-09-25 MED ORDER — SULFAMETHOXAZOLE-TRIMETHOPRIM 800-160 MG PO TABS
1.0000 | ORAL_TABLET | Freq: Two times a day (BID) | ORAL | Status: DC
  Administered 2021-09-25 – 2021-09-26 (×3): 1 via ORAL
  Filled 2021-09-25 (×3): qty 1

## 2021-09-25 NOTE — Progress Notes (Signed)
PROGRESS NOTE    Anna Garcia  OBS:[id number] DOB: 04-15-1981 DOA: 09/22/2021 PCP: [name], MD   Brief Narrative: 41 year old with past medical history significant for type 1 diabetes, gastroparesis, hypertension, diabetic peripheral neuropathy, overweight, tobacco use, stage IIIa CKD who presents complaining of hyperglycemia, dyspnea, nausea and vomiting.  She has not missed any of her insulin.  Patient was found to be in DKA with a CO2 less than 7, tachycardic, tachypneic.  pH venous blood gas less than 6.9.  Anion gap 20.  Creatinine 2.6, prior creatinine range 1.3.   Assessment & Plan:   Principal Problem:   DKA (diabetic ketoacidosis) ([place]) Active Problems:   SIRS (systemic inflammatory response syndrome) (HCC)   Asthma   AKI (acute kidney injury) ([place])   Transaminitis   Hypertension   Gastroparesis   CKD stage G3a/A3, GFR 45-59 and albumin creatinine ratio >300 mg/g (HCC)   Hypomagnesemia   Hypophosphatemia  1- DKA, Diabetes Type 1:  -Presented with Bicarb less than 7, CBG 355, PH 6.9, anion gap 20, Beta hydroxybutyric acid more than 8.  -Work Up for infection: Negative chest x ray, UA;  Negative nitrates, WBC 0-5. -Treated with bicarb gtt, IV fluids, Insulin Gtt.  -She was transition to long acting insulin. Gap closed. Bicarb fluctuates.  -Continue semglee to 20 units daily, 3 units meal coverage, SSI.    2-Hypotension;  Hypotension today post endoscopy. Improved with 1 L IV fluids.     Recurrent Nausea, vomiting, esophageal dysphagia. Esophageal stenosis.  She is suppose to get Endoscopy out patient.  She presented with dehydration.  GI consulted. Underwent endoscopy find to have esophageal stenosis. S/P dilation.  Continue with PPI  Increase anion gap metabolic acidosis.  In setting DKA, dehydration.  Continue with IV fluids.  Received bicarb gtt Bicarb fluctuates. Last admission for DKA she required oral Bicarb.  Continue with oral bicarb.  IV bicarb  for few hours today.   Gastroenteritis;  Report diarrhea, on and off. Worse yesterday.  GI pathogen positive for Yersenia enterocolitica.  Plan to treat with Bactrim for 5 days.  Support care.   AKI on CKD stage IIIa;  Prior creatinene range 1.3 Presents with Cr 2.6 Likely related to hypovolemia.  Continue with IV fluids.  Improving cr down to 1.4  Anemia;  Might be anemia of chronic diseases and acute illness.  Will proceed with one unit PRBC RUQ [place]: Hepatic steatosis. Please note limited evaluation for focal hepatic masses in a patient with hepatic steatosis due to decreased penetration of the acoustic ultrasound waves. Previously identified right hepatic lesion not visualized.   Hypokalemia; Resolved.    Hypophosphatemia; replaced.   Hypomagnesemia:  Replaced IV>  Transaminases;  Trending down.  Check hepatitis panel.  Prior US 2/02: possible hemangioma, hepatic steatosis. No gallstone.  She will need MRI with contrast when renal function recover.     Estimated body mass index is 30.04 kg/m as calculated from the following:   Height as of this encounter: '5\' 4"'$  (1.626 m).   Weight as of this encounter: 79.4 kg.   DVT prophylaxis: Lovenox Code Status: Full code Family Communication: Care discussed with patient.  Disposition Plan:  Status is: inpatient.     Consultants:  GI  Procedures:  None  Antimicrobials:    Subjective: She has been having diarrhea on and off , worse yesterday.   Objective: Vitals:   09/25/21 0600 09/25/21 0700 09/25/21 0803 09/25/21 0804  BP: (!) 74/41 99/64 118/71  Pulse: 85 (!) 101 (!) 102   Resp: 20 (!) 22 20   Temp:      TempSrc:      SpO2: 96% 98% 97% 98%  Weight:      Height:        Intake/Output Summary (Last 24 hours) at 09/25/2021 0830 Last data filed at 09/25/2021 0407 Gross per 24 hour  Intake 2964.65 ml  Output 901 ml  Net 2063.65 ml    Filed Weights   09/22/21 0655 09/22/21 1210 09/24/21 0735   Weight: 74.2 kg 79.3 kg 79.4 kg    Examination:  General exam: NAD Respiratory system: CTA Cardiovascular system: S 1, S 2 RRR Gastrointestinal system: BS present, soft, nt Central nervous system: Alert Extremities: no edema  Data Reviewed: I have personally reviewed following labs and imaging studies  CBC: Recent Labs  Lab 09/22/21 0728 09/22/21 0746 09/23/21 0724 09/24/21 0319 09/24/21 2045 09/25/21 0311  WBC 14.6*  --  5.7 4.0 4.0 4.4  NEUTROABS 6.9  --   --   --   --   --   HGB 9.3* 10.9* 7.7* 7.3* 8.7* 8.6*  HCT 30.9* 32.0* 23.7* 23.5* 28.1* 28.0*  MCV 117.9*  --  109.7* 113.0* 109.3* 108.9*  PLT 232  --  122* PLATELET CLUMPS NOTED ON SMEAR, UNABLE TO ESTIMATE 99* 114*    Basic Metabolic Panel: Recent Labs  Lab 09/23/21 0027 09/23/21 0724 09/23/21 1831 09/24/21 0319 09/24/21 0722 09/24/21 1123 09/25/21 0311  NA 143 141 139 142 144 143 142  K 3.1* 2.9* 3.8 3.5 3.3* 3.6 4.1  CL 118* 116* 117* 122* 124* 123* 121*  CO2 16* 17* 13* 13* 15* 13* 13*  GLUCOSE 139* 119* 287* 192* 195* 220* 243*  BUN '18 16 12 10 9 8 6  '$ CREATININE 1.87* 1.72* 1.78* 1.60* 1.43* 1.45* 1.35*  CALCIUM 7.8* 7.9* 7.6* 7.9* 7.2* 7.7* 7.9*  MG 1.3* 1.8  --  1.8  --   --   --   PHOS 1.3* 1.2* 3.0 2.4*  --   --   --     GFR: Estimated Creatinine Clearance: 56.5 mL/min (A) (by C-G formula based on SCr of 1.35 mg/dL (H)). Liver Function Tests: Recent Labs  Lab 09/22/21 0728 09/23/21 0027 09/24/21 0319  AST 123* 115* 100*  ALT 83* 62* 57*  ALKPHOS 145* 121 124  BILITOT 1.6* 1.1 0.5  PROT 7.3 6.0* 5.7*  ALBUMIN 3.9 3.0* 3.0*    No results for input(s): LIPASE, AMYLASE in the last 168 hours. No results for input(s): AMMONIA in the last 168 hours. Coagulation Profile: No results for input(s): INR, PROTIME in the last 168 hours. Cardiac Enzymes: No results for input(s): CKTOTAL, CKMB, CKMBINDEX, TROPONINI in the last 168 hours. BNP (last 3 results) No results for input(s):  PROBNP in the last 8760 hours. HbA1C: No results for input(s): HGBA1C in the last 72 hours. CBG: Recent Labs  Lab 09/24/21 0858 09/24/21 1200 09/24/21 1647 09/24/21 2124 09/25/21 0743  GLUCAP 185* 193* 187* 241* 193*    Lipid Profile: No results for input(s): CHOL, HDL, LDLCALC, TRIG, CHOLHDL, LDLDIRECT in the last 72 hours. Thyroid Function Tests: No results for input(s): TSH, T4TOTAL, FREET4, T3FREE, THYROIDAB in the last 72 hours. Anemia Panel: Recent Labs    09/23/21 0724  VITAMINB12 619  FOLATE 7.1  FERRITIN 119  TIBC 245*  IRON 60  RETICCTPCT 1.5    Sepsis Labs: No results for input(s): PROCALCITON, LATICACIDVEN in the last  168 hours.  Recent Results (from the past 240 hour(s))  MRSA Next Gen by PCR, Nasal     Status: None   Collection Time: 09/23/21  2:47 AM   Specimen: Nasal Mucosa; Nasal Swab  Result Value Ref Range Status   MRSA by PCR Next Gen NOT DETECTED NOT DETECTED Final    Comment: (NOTE) The GeneXpert MRSA Assay (FDA approved for NASAL specimens only), is one component of a comprehensive MRSA colonization surveillance program. It is not intended to diagnose MRSA infection nor to guide or monitor treatment for MRSA infections. Test performance is not FDA approved in patients less than 12 years old. Performed at [facility], [place] [address], [place], [place] [address]           Radiology Studies: US Abdomen Limited RUQ (LIVER/GB)  Result Date: 09/24/2021 CLINICAL DATA:  Right upper quadrant ultrasound. Hypertension, diabetes. Hemangioma. EXAM: ULTRASOUND ABDOMEN LIMITED RIGHT UPPER QUADRANT COMPARISON:  None Available. FINDINGS: Gallbladder: No gallstones or wall thickening visualized. No sonographic Murphy sign noted by sonographer. Common bile duct: Diameter: 7 mm. Liver: Previously identified right hepatic lesion not visualized. No new focal lesion identified. Increased parenchymal echogenicity. Portal vein is patent on  color Doppler imaging with normal direction of blood flow towards the liver. Other: None. IMPRESSION: 1. Hepatic steatosis. Please note limited evaluation for focal hepatic masses in a patient with hepatic steatosis due to decreased penetration of the acoustic ultrasound waves. 2. Previously identified right hepatic lesion not visualized. Electronically Signed   By: Iven Finn M.D.   On: 09/24/2021 20:05        Scheduled Meds:  Chlorhexidine Gluconate Cloth  6 each Topical Daily   gabapentin  300 mg Oral TID   insulin aspart  0-5 Units Subcutaneous QHS   insulin aspart  0-9 Units Subcutaneous TID WC   insulin aspart  3 Units Subcutaneous TID WC   insulin glargine-yfgn  20 Units Subcutaneous QHS   metoCLOPramide (REGLAN) injection  5 mg Intravenous Q6H   mometasone-formoterol  2 puff Inhalation BID   montelukast  10 mg Oral QHS   pantoprazole  40 mg Oral BID   potassium chloride  40 mEq Oral Once   sodium bicarbonate  1,300 mg Oral TID   Vitamin D (Ergocalciferol)  50,000 Units Oral Q7 days   Continuous Infusions:  sodium chloride     sodium chloride Stopped (09/24/21 1050)    sodium bicarbonate (isotonic) infusion in sterile water 75 mL/hr at 09/25/21 0828   sodium chloride       LOS: 2 days    Time spent: 35 minutes.     Elmarie Shiley, MD [facility]   If 7PM-7AM, please contact night-coverage [email or web address]  09/25/2021, 8:30 AM

## 2021-09-25 NOTE — Progress Notes (Signed)
[name] Gastroenterology Progress Note  Anna Garcia 41 y.o. 1980-09-23  CC: Dysphagia, nausea, vomiting   Subjective: Patient resting comfortably in bed this morning.  States she is doing well following EGD.  Tolerating diet and denies any further difficulty swallowing.  Denies nausea, vomiting, fever, chills.  Having normal bowel movements.  Denies dysuria.  Denies abdominal pain.  On famotidine and Dexilant at home.  ROS : Review of Systems  Constitutional:  Negative for chills and fever.  Gastrointestinal:  Negative for abdominal pain, blood in stool, constipation, diarrhea, heartburn, melena, nausea and vomiting.  Genitourinary:  Negative for dysuria.  Musculoskeletal:  Negative for falls.     Objective: Vital signs in last 24 hours: Vitals:   09/25/21 0804 09/25/21 0900  BP:  (!) 94/55  Pulse:  (!) 104  Resp:  (!) 22  Temp: 98.3 F (36.8 C)   SpO2: 98% 98%    Physical Exam:  General:  Alert, cooperative, no distress, appears stated age  Head:  Normocephalic, without obvious abnormality, atraumatic  Eyes:  Anicteric sclera, EOM's intact, conjunctival pallor  Lungs:   Clear to auscultation bilaterally, respirations unlabored  Heart:  Regular rate and rhythm, S1, S2 normal  Abdomen:   Soft, non-tender, bowel sounds active all four quadrants,  no masses,   Extremities: Extremities normal, atraumatic, no  edema  Pulses: 2+ and symmetric    Lab Results: Recent Labs    09/23/21 0724 09/23/21 1831 09/24/21 0319 09/24/21 0722 09/24/21 1123 09/25/21 0311  NA 141 139 142   < > 143 142  K 2.9* 3.8 3.5   < > 3.6 4.1  CL 116* 117* 122*   < > 123* 121*  CO2 17* 13* 13*   < > 13* 13*  GLUCOSE 119* 287* 192*   < > 220* 243*  BUN _0 < > 8 6  CREATININE 1.72* 1.78* 1.60*   < > 1.45* 1.35*  CALCIUM 7.9* 7.6* 7.9*   < > 7.7* 7.9*  MG 1.8  --  1.8  --   --   --   PHOS 1.2* 3.0 2.4*  --   --   --    < > = values in this interval not displayed.   Recent Labs     09/23/21 0027 09/24/21 0319  AST 115* 100*  ALT 62* 57*  ALKPHOS 121 124  BILITOT 1.1 0.5  PROT 6.0* 5.7*  ALBUMIN 3.0* 3.0*   Recent Labs    09/24/21 2045 09/25/21 0311  WBC 4.0 4.4  HGB 8.7* 8.6*  HCT 28.1* 28.0*  MCV 109.3* 108.9*  PLT 99* 114*   No results for input(s): LABPROT, INR in the last 72 hours.    Assessment Dysphagia -Solids and liquids, nonprogressive -S/p EGD 09/24/2021 -small hiatal hernia, benign-appearing esophageal stenosis, dilated.  Minimal gastritis.  Otherwise normal.   History of gastroparesis, on bethanechol-5 mg 3 times a day   History of GERD, on famotidine 40 mg twice a day and Dexilant 60 mg daily at home   Longstanding history of diabetes  DKA - Hypokalemia, improved, potassium 4.1 - Acidosis bicarb 13 - Renal impairment, creatinine 1.35, GFR 51  Macrocytic anemia -Hemoglobin 8.6, MCV 108.9, platelets 114, no leukocytosis  Slightly elevated AST 100, ALT 57 with normal T. bili 0.5, normal alk phos 124, hepatitis B and hepatitis C serology negative from 2/23  Hepatic steatosis and 3.8 cm right hepatic hemangioma on ultrasound from 05/24/2021   Plan: Dysphagia  improved following EGD with dilation. Tolerating diet. Continue famotidine and pantoprazole. Continue metoclopramide with history of gastroparesis. Continue supportive care and antiemetics as needed. [facility] will sign off. Please contact us if we can be of any further assistance during this hospital stay.   Angelique Holm PA-C 09/25/2021, 9:33 AM  Contact #  [phone]

## 2021-09-25 NOTE — Progress Notes (Signed)
Report called to Alphonzo Cruise. All questions answered at this time. All pt belongings and paper chart transferred with pt. Patient was taken upstairs in the wheelchair on tele by this RN. Handoff completed.

## 2021-09-26 DIAGNOSIS — E0810 Diabetes mellitus due to underlying condition with ketoacidosis without coma: Secondary | ICD-10-CM | POA: Diagnosis not present

## 2021-09-26 LAB — GLUCOSE, CAPILLARY: Glucose-Capillary: 157 mg/dL — ABNORMAL HIGH (ref 70–99)

## 2021-09-26 MED ORDER — SODIUM BICARBONATE 650 MG PO TABS
650.0000 mg | ORAL_TABLET | Freq: Three times a day (TID) | ORAL | 0 refills | Status: AC
Start: 2021-09-26 — End: 2021-10-03

## 2021-09-26 MED ORDER — LANTUS SOLOSTAR 100 UNIT/ML ~~LOC~~ SOPN: 22.0000 [IU] | PEN_INJECTOR | Freq: Every day | SUBCUTANEOUS | 1 refills | Status: DC

## 2021-09-26 MED ORDER — DEXILANT 60 MG PO CPDR: 60.0000 mg | DELAYED_RELEASE_CAPSULE | Freq: Every day | ORAL | 0 refills | Status: DC

## 2021-09-26 MED ORDER — SULFAMETHOXAZOLE-TRIMETHOPRIM 800-160 MG PO TABS: 1.0000 | ORAL_TABLET | Freq: Two times a day (BID) | ORAL | 0 refills | Status: AC

## 2021-09-26 MED ORDER — LOPERAMIDE HCL 2 MG PO CAPS: 2.0000 mg | ORAL_CAPSULE | ORAL | 0 refills | Status: AC | PRN

## 2021-09-26 MED ORDER — VITAMIN D (ERGOCALCIFEROL) 1.25 MG (50000 UNIT) PO CAPS: 50000.0000 [IU] | ORAL_CAPSULE | ORAL | 0 refills | Status: DC

## 2021-09-26 NOTE — Progress Notes (Signed)
MD notified of 2+ pitting edema to above thigh. New orders received. No s/s of acute distress noted.

## 2021-09-26 NOTE — Discharge Summary (Signed)
Physician Discharge Summary   Patient: Anna Garcia MRN: [id number] DOB: [birth year]  Admit date:     09/22/2021  Discharge date: 09/26/21  Discharge Physician: Elmarie Shiley   PCP: Day, [name], MD   Recommendations at discharge:    Follow up on resolution of diarrhea.  Needs renal function to follow bicarb level.  Needs CBC to follow hb.   Discharge Diagnoses: Principal Problem:   DKA (diabetic ketoacidosis) ([place]) Active Problems:   SIRS (systemic inflammatory response syndrome) (HCC)   Asthma   AKI (acute kidney injury) ([place])   Transaminitis   Hypertension   Gastroparesis   CKD stage G3a/A3, GFR 45-59 and albumin creatinine ratio >300 mg/g (HCC)   Hypomagnesemia   Hypophosphatemia  Resolved Problems:   * No resolved hospital problems. *  Hospital Course: 41 year old with past medical history significant for type 1 diabetes, gastroparesis, hypertension, diabetic peripheral neuropathy, overweight, tobacco use, stage IIIa CKD who presents complaining of hyperglycemia, dyspnea, nausea and vomiting.  She has not missed any of her insulin.   Patient was found to be in DKA with a CO2 less than 7, tachycardic, tachypneic.  pH venous blood gas less than 6.9.  Anion gap 20.  Creatinine 2.6, prior creatinine range 1.3.    Assessment and Plan:   1- DKA, Diabetes Type 1:  -Presented with Bicarb less than 7, CBG 355, PH 6.9, anion gap 20, Beta hydroxybutyric acid more than 8.  -Work Up for infection: Negative chest x ray, UA;  Negative nitrates, WBC 0-5. -Treated with bicarb gtt, IV fluids, Insulin Gtt.  -She was transition to long acting insulin. Gap closed. Bicarb fluctuates.  -Continue semglee to 20 units daily, 3 units meal coverage, SSI.  -Stable for discharge.    2-Hypotension;  Hypotension today post endoscopy. Improved with 1 L IV fluids.  Resolved with fluids.    Recurrent Nausea, vomiting, esophageal dysphagia. Esophageal stenosis.  She is suppose to get Endoscopy  out patient.  She presented with dehydration.  GI consulted. Underwent endoscopy find to have esophageal stenosis. S/P dilation.  Continue with PPI   Increase anion gap metabolic acidosis.  In setting DKA, dehydration and Diarrhea.  Continue with IV fluids.  Received bicarb gtt Bicarb fluctuates. Last admission for DKA she required oral Bicarb.  Continue with oral bicarb.  Treated with bicarb gtt.  Likely related to diarrhea. Improved. Discharge on bicarb for 7 days.  Follow bmet.    Gastroenteritis; Yersenia enterocolitica.  Report diarrhea, on and off. Worse yesterday.  GI pathogen positive for Yersenia enterocolitica.  Plan to treat with Bactrim for 5 days.  Support care.    AKI on CKD stage IIIa;  Prior creatinene range 1.3 Presents with Cr 2.6 Likely related to hypovolemia.  Continue with IV fluids.  Improving cr down to 1.2   Anemia;  Might be anemia of chronic diseases and acute illness.  Will proceed with one unit PRBC RUQ [place]: Hepatic steatosis. Please note limited evaluation for focal hepatic masses in a patient with hepatic steatosis due to decreased penetration of the acoustic ultrasound waves. Previously identified right hepatic lesion not visualized.   Petechia rash ingle. Monitor.   Hypokalemia; Resolved.      Hypophosphatemia; replaced.    Hypomagnesemia:  Replaced IV>   Transaminases;  Trending down.  Check hepatitis panel.  Prior US 2/02: possible hemangioma, hepatic steatosis. No gallstone.  She will need MRI with contrast when renal function recover.  Consultants: GI Procedures performed: Endoscopy  Disposition: Home Diet recommendation:  Discharge Diet Orders (From admission, onward)     Start     Ordered   09/26/21 0000  Diet Carb Modified        09/26/21 0923           Carb modified diet DISCHARGE MEDICATION: Allergies as of 09/26/2021   No Known Allergies      Medication List     STOP taking these  medications    acetaminophen 325 MG tablet Commonly known as: TYLENOL   bethanechol 5 MG tablet Commonly known as: URECHOLINE   losartan 25 MG tablet Commonly known as: COZAAR       TAKE these medications    Apidra SoloStar 100 UNIT/ML Solostar Pen Generic drug: insulin glulisine Inject 1-15 Units into the skin See admin instructions. Changes as per carb intake as needed high CBG   Dexilant 60 MG capsule Generic drug: dexlansoprazole Take 1 capsule (60 mg total) by mouth daily.   famotidine 40 MG tablet Commonly known as: PEPCID Take 40 mg by mouth 2 (two) times daily.   gabapentin 300 MG capsule Commonly known as: NEURONTIN Take 1 capsule (300 mg total) by mouth 3 (three) times daily.   Lantus SoloStar 100 UNIT/ML Solostar Pen Generic drug: insulin glargine Inject 22 Units into the skin daily. What changed:  how much to take when to take this   levalbuterol 45 MCG/ACT inhaler Commonly known as: XOPENEX HFA Inhale 1 puff into the lungs every 6 (six) hours as needed for wheezing.   loperamide 2 MG capsule Commonly known as: IMODIUM Take 1 capsule (2 mg total) by mouth as needed for diarrhea or loose stools.   mometasone-formoterol 100-5 MCG/ACT Aero Commonly known as: DULERA Inhale 2 puffs into the lungs 2 (two) times daily.   montelukast 10 MG tablet Commonly known as: SINGULAIR Take 10 mg by mouth at bedtime.   Motegrity 1 MG Tabs Generic drug: Prucalopride Succinate Take 1 mg by mouth daily.   rosuvastatin 20 MG tablet Commonly known as: CRESTOR Take 20 mg by mouth daily.   sodium bicarbonate 650 MG tablet Take 1 tablet (650 mg total) by mouth 3 (three) times daily for 7 days.   sulfamethoxazole-trimethoprim 800-160 MG tablet Commonly known as: BACTRIM DS Take 1 tablet by mouth every 12 (twelve) hours for 6 days.   Vitamin D (Ergocalciferol) 1.25 MG (50000 UNIT) Caps capsule Commonly known as: DRISDOL Take 1 capsule (50,000 Units total) by  mouth every 7 (seven) days. Start taking on: October 02, 2021        Follow-up Information     Day, [name], MD Follow up in 1 week(s).   Specialty: Internal Medicine Contact information: [place] [address] [phone]                Discharge Exam: [place] Weights   09/22/21 0655 09/22/21 1210 09/24/21 0735  Weight: 74.2 kg 79.3 kg 79.4 kg   General; nad   Condition at discharge: stable  The results of significant diagnostics from this hospitalization (including imaging, microbiology, ancillary and laboratory) are listed below for reference.   Imaging Studies: DG Chest Port 1 View  Result Date: 09/22/2021 CLINICAL DATA:  Short of breath EXAM: PORTABLE CHEST 1 VIEW COMPARISON:  None Available. FINDINGS: The lungs are clear and negative for focal airspace consolidation, pulmonary edema or suspicious pulmonary nodule. No pleural effusion or pneumothorax. Cardiac and mediastinal contours are  within normal limits. No acute fracture or lytic or blastic osseous lesions. The visualized upper abdominal bowel gas pattern is unremarkable. IMPRESSION: Negative chest x-ray. Electronically Signed   By: Jacqulynn Cadet M.D.   On: 09/22/2021 07:41   US Abdomen Limited RUQ (LIVER/GB)  Result Date: 09/24/2021 CLINICAL DATA:  Right upper quadrant ultrasound. Hypertension, diabetes. Hemangioma. EXAM: ULTRASOUND ABDOMEN LIMITED RIGHT UPPER QUADRANT COMPARISON:  None Available. FINDINGS: Gallbladder: No gallstones or wall thickening visualized. No sonographic Murphy sign noted by sonographer. Common bile duct: Diameter: 7 mm. Liver: Previously identified right hepatic lesion not visualized. No new focal lesion identified. Increased parenchymal echogenicity. Portal vein is patent on color Doppler imaging with normal direction of blood flow towards the liver. Other: None. IMPRESSION: 1. Hepatic steatosis. Please note limited evaluation for focal hepatic masses in a patient  with hepatic steatosis due to decreased penetration of the acoustic ultrasound waves. 2. Previously identified right hepatic lesion not visualized. Electronically Signed   By: Iven Finn M.D.   On: 09/24/2021 20:05    Microbiology: Results for orders placed or performed during the hospital encounter of 09/22/21  MRSA Next Gen by PCR, Nasal     Status: None   Collection Time: 09/23/21  2:47 AM   Specimen: Nasal Mucosa; Nasal Swab  Result Value Ref Range Status   MRSA by PCR Next Gen NOT DETECTED NOT DETECTED Final    Comment: (NOTE) The GeneXpert MRSA Assay (FDA approved for NASAL specimens only), is one component of a comprehensive MRSA colonization surveillance program. It is not intended to diagnose MRSA infection nor to guide or monitor treatment for MRSA infections. Test performance is not FDA approved in patients less than 17 years old. Performed at [facility], [place] [address] [place], [place] [address]   Gastrointestinal Panel by PCR , Stool     Status: Abnormal   Collection Time: 09/24/21  3:33 PM   Specimen: STOOL  Result Value Ref Range Status   Campylobacter species NOT DETECTED NOT DETECTED Final   Plesimonas shigelloides NOT DETECTED NOT DETECTED Final   Salmonella species NOT DETECTED NOT DETECTED Final   Yersinia enterocolitica DETECTED (A) NOT DETECTED Final    Comment: RESULT CALLED TO, READ BACK BY AND VERIFIED WITH: COCHRAN HOPE RN ON 09/25/21 1007 RP    Vibrio species NOT DETECTED NOT DETECTED Final   Vibrio cholerae NOT DETECTED NOT DETECTED Final   Enteroaggregative E coli (EAEC) NOT DETECTED NOT DETECTED Final   Enteropathogenic E coli (EPEC) NOT DETECTED NOT DETECTED Final   Enterotoxigenic E coli (ETEC) NOT DETECTED NOT DETECTED Final   Shiga like toxin producing E coli (STEC) NOT DETECTED NOT DETECTED Final   Shigella/Enteroinvasive E coli (EIEC) NOT DETECTED NOT DETECTED Final   Cryptosporidium NOT DETECTED NOT DETECTED Final    Cyclospora cayetanensis NOT DETECTED NOT DETECTED Final   Entamoeba histolytica NOT DETECTED NOT DETECTED Final   Giardia lamblia NOT DETECTED NOT DETECTED Final   Adenovirus F40/41 NOT DETECTED NOT DETECTED Final   Astrovirus NOT DETECTED NOT DETECTED Final   Norovirus GI/GII NOT DETECTED NOT DETECTED Final   Rotavirus A NOT DETECTED NOT DETECTED Final   Sapovirus (I, II, IV, and V) NOT DETECTED NOT DETECTED Final    Comment: Performed at [facility], [address] [place], [place] [address]    Labs: CBC: Recent Labs  Lab 09/22/21 0728 09/22/21 0746 09/23/21 0724 09/24/21 0319 09/24/21 2045 09/25/21 0311  WBC 14.6*  --  5.7  4.0 4.0 4.4  NEUTROABS 6.9  --   --   --   --   --   HGB 9.3* 10.9* 7.7* 7.3* 8.7* 8.6*  HCT 30.9* 32.0* 23.7* 23.5* 28.1* 28.0*  MCV 117.9*  --  109.7* 113.0* 109.3* 108.9*  PLT 232  --  122* PLATELET CLUMPS NOTED ON SMEAR, UNABLE TO ESTIMATE 99* 415*   Basic Metabolic Panel: Recent Labs  Lab 09/23/21 0027 09/23/21 0724 09/23/21 1831 09/24/21 0319 09/24/21 0722 09/24/21 1123 09/25/21 0311 09/25/21 2048  NA 143 141 139 142 144 143 142 141  K 3.1* 2.9* 3.8 3.5 3.3* 3.6 4.1 3.6  CL 118* 116* 117* 122* 124* 123* 121* 116*  CO2 16* 17* 13* 13* 15* 13* 13* 19*  GLUCOSE 139* 119* 287* 192* 195* 220* 243* 215*  BUN '18 16 12 10 9 8 6 '$ <5*  CREATININE 1.87* 1.72* 1.78* 1.60* 1.43* 1.45* 1.35* 1.27*  CALCIUM 7.8* 7.9* 7.6* 7.9* 7.2* 7.7* 7.9* 7.8*  MG 1.3* 1.8  --  1.8  --   --   --   --   PHOS 1.3* 1.2* 3.0 2.4*  --   --   --   --    Liver Function Tests: Recent Labs  Lab 09/22/21 0728 09/23/21 0027 09/24/21 0319  AST 123* 115* 100*  ALT 83* 62* 57*  ALKPHOS 145* 121 124  BILITOT 1.6* 1.1 0.5  PROT 7.3 6.0* 5.7*  ALBUMIN 3.9 3.0* 3.0*   CBG: Recent Labs  Lab 09/25/21 0743 09/25/21 1125 09/25/21 1639 09/25/21 2107 09/26/21 0817  GLUCAP 193* 188* 106* 211* 157*    Discharge time spent: greater than 30  minutes.  Signed: Elmarie Shiley, MD [facility] 09/26/2021

## 2021-09-28 LAB — OVA + PARASITE EXAM

## 2021-09-28 LAB — O&P RESULT

## 2021-11-18 ENCOUNTER — Emergency Department (HOSPITAL_COMMUNITY): Payer: Medicare Other

## 2021-11-18 ENCOUNTER — Observation Stay (HOSPITAL_COMMUNITY)
Admission: EM | Admit: 2021-11-18 | Discharge: 2021-11-20 | Disposition: A | Discharge status: Home / Self Care | Payer: Medicare Other | Attending: Internal Medicine | Admitting: Internal Medicine

## 2021-11-18 ENCOUNTER — Encounter (HOSPITAL_COMMUNITY): Payer: Self-pay

## 2021-11-18 ENCOUNTER — Other Ambulatory Visit: Payer: Self-pay

## 2021-11-18 DIAGNOSIS — R7989 Other specified abnormal findings of blood chemistry: Secondary | ICD-10-CM

## 2021-11-18 DIAGNOSIS — E1043 Type 1 diabetes mellitus with diabetic autonomic (poly)neuropathy: Secondary | ICD-10-CM | POA: Diagnosis not present

## 2021-11-18 DIAGNOSIS — J4530 Mild persistent asthma, uncomplicated: Secondary | ICD-10-CM | POA: Insufficient documentation

## 2021-11-18 DIAGNOSIS — E8729 Other acidosis: Secondary | ICD-10-CM | POA: Diagnosis not present

## 2021-11-18 DIAGNOSIS — E876 Hypokalemia: Secondary | ICD-10-CM | POA: Diagnosis present

## 2021-11-18 DIAGNOSIS — Z7951 Long term (current) use of inhaled steroids: Secondary | ICD-10-CM | POA: Diagnosis not present

## 2021-11-18 DIAGNOSIS — Z87891 Personal history of nicotine dependence: Secondary | ICD-10-CM | POA: Insufficient documentation

## 2021-11-18 DIAGNOSIS — K3184 Gastroparesis: Secondary | ICD-10-CM

## 2021-11-18 DIAGNOSIS — R0602 Shortness of breath: Secondary | ICD-10-CM | POA: Insufficient documentation

## 2021-11-18 DIAGNOSIS — R7401 Elevation of levels of liver transaminase levels: Secondary | ICD-10-CM | POA: Diagnosis not present

## 2021-11-18 DIAGNOSIS — E1010 Type 1 diabetes mellitus with ketoacidosis without coma: Secondary | ICD-10-CM | POA: Insufficient documentation

## 2021-11-18 DIAGNOSIS — R112 Nausea with vomiting, unspecified: Secondary | ICD-10-CM | POA: Diagnosis not present

## 2021-11-18 DIAGNOSIS — I10 Essential (primary) hypertension: Secondary | ICD-10-CM | POA: Insufficient documentation

## 2021-11-18 DIAGNOSIS — K219 Gastro-esophageal reflux disease without esophagitis: Secondary | ICD-10-CM | POA: Diagnosis not present

## 2021-11-18 DIAGNOSIS — J4540 Moderate persistent asthma, uncomplicated: Secondary | ICD-10-CM | POA: Diagnosis present

## 2021-11-18 DIAGNOSIS — Z794 Long term (current) use of insulin: Secondary | ICD-10-CM | POA: Insufficient documentation

## 2021-11-18 DIAGNOSIS — R9431 Abnormal electrocardiogram [ECG] [EKG]: Secondary | ICD-10-CM | POA: Diagnosis not present

## 2021-11-18 DIAGNOSIS — Z79899 Other long term (current) drug therapy: Secondary | ICD-10-CM | POA: Diagnosis not present

## 2021-11-18 DIAGNOSIS — Z8249 Family history of ischemic heart disease and other diseases of the circulatory system: Secondary | ICD-10-CM | POA: Diagnosis not present

## 2021-11-18 LAB — CBC
HCT: 38.1 % (ref 36.0–46.0)
Hemoglobin: 12.7 g/dL (ref 12.0–15.0)
MCH: 35.5 pg — ABNORMAL HIGH (ref 26.0–34.0)
MCHC: 33.3 g/dL (ref 30.0–36.0)
MCV: 106.4 fL — ABNORMAL HIGH (ref 80.0–100.0)
Platelets: 122 10*3/uL — ABNORMAL LOW (ref 150–400)
RBC: 3.58 MIL/uL — ABNORMAL LOW (ref 3.87–5.11)
RDW: 17 % — ABNORMAL HIGH (ref 11.5–15.5)
WBC: 7.5 10*3/uL (ref 4.0–10.5)
nRBC: 0.3 % — ABNORMAL HIGH (ref 0.0–0.2)

## 2021-11-18 LAB — CBG MONITORING, ED
Glucose-Capillary: 196 mg/dL — ABNORMAL HIGH (ref 70–99)
Glucose-Capillary: 316 mg/dL — ABNORMAL HIGH (ref 70–99)

## 2021-11-18 LAB — COMPREHENSIVE METABOLIC PANEL
ALT: 126 U/L — ABNORMAL HIGH (ref 0–44)
AST: 245 U/L — ABNORMAL HIGH (ref 15–41)
Albumin: 3.6 g/dL (ref 3.5–5.0)
Alkaline Phosphatase: 291 U/L — ABNORMAL HIGH (ref 38–126)
Anion gap: 20 — ABNORMAL HIGH (ref 5–15)
BUN: <5 mg/dL — ABNORMAL LOW (ref 6–20)
CO2: 15 mmol/L — ABNORMAL LOW (ref 22–32)
Calcium: 9.5 mg/dL (ref 8.9–10.3)
Chloride: 101 mmol/L (ref 98–111)
Creatinine, Ser: 1.2 mg/dL — ABNORMAL HIGH (ref 0.44–1.00)
GFR, Estimated: 59 mL/min — ABNORMAL LOW (ref >60)
Glucose, Bld: 269 mg/dL — ABNORMAL HIGH (ref 70–99)
Potassium: 2.6 mmol/L — CL (ref 3.5–5.1)
Sodium: 136 mmol/L (ref 135–145)
Total Bilirubin: 2.3 mg/dL — ABNORMAL HIGH (ref 0.3–1.2)
Total Protein: 7.7 g/dL (ref 6.5–8.1)

## 2021-11-18 LAB — LIPASE, BLOOD: Lipase: 23 U/L (ref 11–51)

## 2021-11-18 LAB — BLOOD GAS, VENOUS
Acid-base deficit: 2.6 mmol/L — ABNORMAL HIGH (ref 0.0–2.0)
Bicarbonate: 21.8 mmol/L (ref 20.0–28.0)
O2 Saturation: 36.9 %
Patient temperature: 37
pCO2, Ven: 36 mmHg — ABNORMAL LOW (ref 44–60)
pH, Ven: 7.39 (ref 7.25–7.43)
pO2, Ven: <31 mmHg — CL (ref 32–45)

## 2021-11-18 LAB — I-STAT BETA HCG BLOOD, ED (MC, WL, AP ONLY): I-stat hCG, quantitative: <5 m[IU]/mL (ref <5)

## 2021-11-18 LAB — MAGNESIUM: Magnesium: 2.2 mg/dL (ref 1.7–2.4)

## 2021-11-18 MED ORDER — POTASSIUM CHLORIDE CRYS ER 20 MEQ PO TBCR
40.0000 meq | EXTENDED_RELEASE_TABLET | Freq: Once | ORAL | Status: AC
  Administered 2021-11-18: 40 meq via ORAL
  Filled 2021-11-18: qty 2

## 2021-11-18 MED ORDER — POTASSIUM CHLORIDE 10 MEQ/100ML IV SOLN
10.0000 meq | INTRAVENOUS | Status: AC
  Administered 2021-11-18 (×3): 10 meq via INTRAVENOUS
  Filled 2021-11-18 (×3): qty 100

## 2021-11-18 MED ORDER — ACETAMINOPHEN 650 MG RE SUPP: 650.0000 mg | Freq: Four times a day (QID) | RECTAL | Status: DC | PRN

## 2021-11-18 MED ORDER — INSULIN GLARGINE-YFGN 100 UNIT/ML ~~LOC~~ SOLN
10.0000 [IU] | Freq: Every day | SUBCUTANEOUS | Status: DC
  Administered 2021-11-18 – 2021-11-19 (×2): 10 [IU] via SUBCUTANEOUS
  Filled 2021-11-18 (×3): qty 0.1

## 2021-11-18 MED ORDER — INSULIN ASPART 100 UNIT/ML IJ SOLN
0.0000 [IU] | Freq: Three times a day (TID) | INTRAMUSCULAR | Status: DC
  Administered 2021-11-19 (×3): 3 [IU] via SUBCUTANEOUS
  Administered 2021-11-20: 8 [IU] via SUBCUTANEOUS
  Filled 2021-11-18: qty 0.15

## 2021-11-18 MED ORDER — METOCLOPRAMIDE HCL 5 MG/ML IJ SOLN
10.0000 mg | Freq: Once | INTRAMUSCULAR | Status: AC
  Administered 2021-11-18: 10 mg via INTRAVENOUS
  Filled 2021-11-18: qty 2

## 2021-11-18 MED ORDER — LACTATED RINGERS IV SOLN
INTRAVENOUS | Status: DC
Start: 2021-11-18 — End: 2021-11-20

## 2021-11-18 MED ORDER — ONDANSETRON HCL 4 MG/2ML IJ SOLN
4.0000 mg | Freq: Four times a day (QID) | INTRAMUSCULAR | Status: DC | PRN
  Administered 2021-11-19 – 2021-11-20 (×2): 4 mg via INTRAVENOUS
  Filled 2021-11-18 (×2): qty 2

## 2021-11-18 MED ORDER — LACTATED RINGERS IV BOLUS
1000.0000 mL | Freq: Once | INTRAVENOUS | Status: AC
  Administered 2021-11-18: 1000 mL via INTRAVENOUS

## 2021-11-18 MED ORDER — ACETAMINOPHEN 325 MG PO TABS: 650.0000 mg | ORAL_TABLET | Freq: Four times a day (QID) | ORAL | Status: DC | PRN

## 2021-11-18 MED ORDER — INSULIN ASPART 100 UNIT/ML IJ SOLN
6.0000 [IU] | Freq: Once | INTRAMUSCULAR | Status: AC
  Administered 2021-11-18: 6 [IU] via SUBCUTANEOUS
  Filled 2021-11-18: qty 0.06

## 2021-11-18 MED ORDER — LORAZEPAM 2 MG/ML IJ SOLN
1.0000 mg | Freq: Four times a day (QID) | INTRAMUSCULAR | Status: DC | PRN
  Administered 2021-11-19 – 2021-11-20 (×2): 1 mg via INTRAVENOUS
  Filled 2021-11-18 (×2): qty 1

## 2021-11-18 MED ORDER — LACTATED RINGERS IV BOLUS
1000.0000 mL | Freq: Once | INTRAVENOUS | Status: AC
Start: 2021-11-18 — End: 2021-11-19
  Administered 2021-11-18: 1000 mL via INTRAVENOUS

## 2021-11-18 NOTE — ED Triage Notes (Signed)
Patient said she has been vomiting on and off for 2 months. Stated she is a type 1 diabetic and feels like she is in DKA. Said before she came her CBG was over 300. Does not have an insulin pump. Gave herself 3 units at 545pm.

## 2021-11-18 NOTE — ED Notes (Signed)
Unsuccessful attempt at getting labs

## 2021-11-18 NOTE — ED Provider Notes (Signed)
Anna Garcia Provider Note   CSN: [id number] Arrival date & time: 11/18/21  1854     History  Chief Complaint  Patient presents with   Emesis    Anna Garcia is a 41 y.o. female.  HPI 41 year old female presents with nausea and intermittent vomiting for about a month.  This has been ongoing since she was discharged back in June.  Persistent nausea and decreased p.o. intake.  She feels weak and is concerned she is going into DKA.  No fevers but persistent chills.  Occasional abdominal pain that moves to different locations but not present currently.  She also feels like her heart is starting to race and that she is becoming short of breath which often happens with her DKA.  Home Medications Prior to Admission medications   Medication Sig Start Date End Date Taking? Authorizing Provider  BAQSIMI TWO PACK 3 MG/DOSE POWD Place 1 spray into both nostrils as needed. 10/30/21  Yes [provider]  DEXILANT 60 MG capsule Take 1 capsule (60 mg total) by mouth daily. 09/26/21  Yes Regalado, Belkys A, MD  famotidine (PEPCID) 40 MG tablet Take 40 mg by mouth 2 (two) times daily. 05/28/19  Yes [provider]  gabapentin (NEURONTIN) 300 MG capsule Take 1 capsule (300 mg total) by mouth 3 (three) times daily. 05/28/21  Yes Eugenie Filler, MD  granisetron (KYTRIL) 1 MG tablet Take 1 mg by mouth every 12 (twelve) hours. 11/05/21  Yes [provider]  insulin glargine (LANTUS SOLOSTAR) 100 UNIT/ML Solostar Pen Inject 22 Units into the skin daily. Patient taking differently: Inject 10 Units into the skin at bedtime. 09/26/21  Yes Regalado, Belkys A, MD  insulin glulisine (APIDRA SOLOSTAR) 100 UNIT/ML Solostar Pen Inject 1-15 Units into the skin See admin instructions. Changes as per carb intake as needed high CBG 07/13/20  Yes [provider]  loperamide (IMODIUM) 2 MG capsule Take 1 capsule (2 mg total) by mouth as needed for diarrhea or  loose stools. 09/26/21  Yes Regalado, Belkys A, MD  mometasone-formoterol (DULERA) 100-5 MCG/ACT AERO Inhale 2 puffs into the lungs 2 (two) times daily. 07/07/20  Yes Mercy Riding, MD  montelukast (SINGULAIR) 10 MG tablet Take 10 mg by mouth at bedtime. 05/24/19  Yes [provider]  MOTEGRITY 1 MG TABS Take 1 mg by mouth daily. 08/31/21  Yes [provider]  rosuvastatin (CRESTOR) 20 MG tablet Take 20 mg by mouth daily. 05/24/19  Yes [provider]  Vitamin D, Ergocalciferol, (DRISDOL) 1.25 MG (50000 UNIT) CAPS capsule Take 1 capsule (50,000 Units total) by mouth every 7 (seven) days. Patient not taking: Reported on 11/18/2021 10/02/21   Regalado, Jerald Kief A, MD  insulin aspart (NOVOLOG) 100 UNIT/ML injection Inject 3 units three times a day with meals if you eat over 50% of your meals. In addition, use sliding scale as follows: CBG 70 - 120: 0 units CBG 121 - 150: 2 units CBG 151 - 200: 3 units CBG 201 - 250: 5 units CBG 251 - 300: 8 units CBG 301 - 350: 11 units CBG 351 - 400: 15 units CBG > 400: call MD and obtain STAT lab verification Patient not taking: No sig reported 07/07/20 10/13/20  Mercy Riding, MD      Allergies    Patient has no known allergies.    Review of Systems   Review of Systems  Constitutional:  Positive for chills. Negative for fever.  Respiratory:  Positive for shortness of breath. Negative for cough.   Cardiovascular:  Negative for chest pain.  Gastrointestinal:  Positive for abdominal pain and vomiting.  Genitourinary:  Negative for dysuria.    Physical Exam Updated Vital Signs BP (!) 124/98   Pulse 93   Temp 97.9 F (36.6 C) (Oral)   Resp 14   Ht '5\' 4"'$  (1.626 m)   Wt 77.1 kg   SpO2 100%   BMI 29.18 kg/m  Physical Exam Vitals and nursing note reviewed.  Constitutional:      Appearance: She is well-developed. She is not ill-appearing or diaphoretic.  HENT:     Head: Normocephalic and atraumatic.  Cardiovascular:     Rate and Rhythm:  Regular rhythm. Tachycardia present.     Heart sounds: Normal heart sounds.     Comments: HR 100 Pulmonary:     Effort: Pulmonary effort is normal. No tachypnea or accessory muscle usage.     Breath sounds: Normal breath sounds. No wheezing or rales.  Abdominal:     Palpations: Abdomen is soft.     Tenderness: There is abdominal tenderness (mild, generalized).  Skin:    General: Skin is warm and dry.  Neurological:     Mental Status: She is alert.     ED Results / Procedures / Treatments   Labs (all labs ordered are listed, but only abnormal results are displayed) Labs Reviewed  COMPREHENSIVE METABOLIC PANEL - Abnormal; Notable for the following components:      Result Value   Potassium 2.6 (*)    CO2 15 (*)    Glucose, Bld 269 (*)    BUN <5 (*)    Creatinine, Ser 1.20 (*)    AST 245 (*)    ALT 126 (*)    Alkaline Phosphatase 291 (*)    Total Bilirubin 2.3 (*)    GFR, Estimated 59 (*)    Anion gap 20 (*)    All other components within normal limits  CBC - Abnormal; Notable for the following components:   RBC 3.58 (*)    MCV 106.4 (*)    MCH 35.5 (*)    RDW 17.0 (*)    Platelets 122 (*)    nRBC 0.3 (*)    All other components within normal limits  BLOOD GAS, VENOUS - Abnormal; Notable for the following components:   pCO2, Ven 36 (*)    pO2, Ven <31 (*)    Acid-base deficit 2.6 (*)    All other components within normal limits  CBG MONITORING, ED - Abnormal; Notable for the following components:   Glucose-Capillary 316 (*)    All other components within normal limits  LIPASE, BLOOD  MAGNESIUM  URINALYSIS, ROUTINE W REFLEX MICROSCOPIC  CBC WITH DIFFERENTIAL/PLATELET  COMPREHENSIVE METABOLIC PANEL  MAGNESIUM  BASIC METABOLIC PANEL  HEMOGLOBIN A1C  BETA-HYDROXYBUTYRIC ACID  BLOOD GAS, VENOUS  I-STAT BETA HCG BLOOD, ED ([facility], [facility], [facility] ONLY)    EKG EKG Interpretation  Date/Time:  Sunday November 18 2021 21:20:45 EDT Ventricular Rate:  95 PR Interval:  149 QRS  Duration: 91 QT Interval:  386 QTC Calculation: 486 R Axis:   81 Text Interpretation: Sinus rhythm Probable left atrial enlargement Borderline prolonged QT interval similar to Feb 2023 Confirmed by Sherwood Gambler [phone]) on 11/18/2021 9:28:08 PM  Radiology US Abdomen Limited RUQ (LIVER/GB)  Result Date: 11/18/2021 CLINICAL DATA:  Vomiting. EXAM: ULTRASOUND ABDOMEN LIMITED RIGHT UPPER QUADRANT COMPARISON:  Abdominal ultrasound 09/24/2021 FINDINGS: Gallbladder: No gallstones  or wall thickening visualized. No sonographic Murphy sign noted by sonographer. Common bile duct: Diameter: 7 mm, unchanged. Liver: No focal lesion identified. Increased echogenicity with diffusely coarsened heterogeneous echotexture. Portal vein is patent on color Doppler imaging with normal direction of blood flow towards the liver. Other: None. IMPRESSION: 1. The common bile duct is mildly enlarged measuring 7 mm. This is unchanged from the prior examination. Recommend correlation with lab values. 2. No cholelithiasis. 3. Echogenic heterogeneous liver echotexture may be related to diffuse hepatocellular disease. This can be further evaluated with liver MRI if clinically warranted. Electronically Signed   By: Ronney Asters M.D.   On: 11/18/2021 22:19   DG Chest Portable 1 View  Result Date: 11/18/2021 CLINICAL DATA:  Shortness of breath and vomiting EXAM: PORTABLE CHEST 1 VIEW COMPARISON:  09/22/2021 FINDINGS: The heart size and mediastinal contours are within normal limits. Both lungs are clear. The visualized skeletal structures are unremarkable. IMPRESSION: No active disease. Electronically Signed   By: Inez Catalina M.D.   On: 11/18/2021 21:41    Procedures Procedures    Medications Ordered in ED Medications  potassium chloride 10 mEq in 100 mL IVPB (10 mEq Intravenous New Bag/Given 11/18/21 2247)  acetaminophen (TYLENOL) tablet 650 mg (has no administration in time range)    Or  acetaminophen (TYLENOL) suppository 650  mg (has no administration in time range)  lactated ringers infusion (has no administration in time range)  insulin glargine-yfgn (SEMGLEE) injection 10 Units (has no administration in time range)  insulin aspart (novoLOG) injection 6 Units (has no administration in time range)  insulin aspart (novoLOG) injection 0-15 Units (has no administration in time range)  LORazepam (ATIVAN) injection 1 mg (has no administration in time range)  ondansetron (ZOFRAN) injection 4 mg (has no administration in time range)  lactated ringers bolus 1,000 mL (0 mLs Intravenous Stopped 11/18/21 2240)  metoCLOPramide (REGLAN) injection 10 mg (10 mg Intravenous Given 11/18/21 2107)  potassium chloride SA (KLOR-CON M) CR tablet 40 mEq (40 mEq Oral Given 11/18/21 2123)  lactated ringers bolus 1,000 mL (1,000 mLs Intravenous New Bag/Given 11/18/21 2306)    ED Course/ Medical Decision Making/ A&P                           Medical Decision Making Amount and/or Complexity of Data Reviewed Labs: ordered.    Details: Hypokalemia with low bicarbonate and anion gap acidosis.  However VBG is unremarkable.  LFTs are abnormal Radiology: ordered and independent interpretation performed.    Details: No pneumonia on x-ray or cholecystitis ECG/medicine tests: independent interpretation performed.    Details: Mildly long QT, similar to prior  Risk Prescription drug management. Decision regarding hospitalization.   No vomiting here.  Benign abdominal exam.  Did work-up so far where it seems like she has an anion gap acidosis but at the same time her VBG does not support typical DKA.  Could be dehydration related.  For now we will start IV fluids and replace potassium.  Magnesium ordered as well.  Abnormal LFTs or right upper quadrant ultrasound was obtained which is nonspecific and she probably needs an MRI.  CBD dilation seems to be chronic.  Overall, she will need admission.  Discussed with Dr. Velia Meyer.  For now we will hold off  on drip as this is not clearly DKA.        Final Clinical Impression(s) / ED Diagnoses Final diagnoses:  Hypokalemia  Abnormal LFTs  Rx / DC Orders ED Discharge Orders     None         Sherwood Gambler, MD 11/18/21 2311

## 2021-11-19 ENCOUNTER — Encounter (HOSPITAL_COMMUNITY): Payer: Self-pay | Admitting: Internal Medicine

## 2021-11-19 DIAGNOSIS — J4540 Moderate persistent asthma, uncomplicated: Secondary | ICD-10-CM | POA: Diagnosis present

## 2021-11-19 DIAGNOSIS — E8729 Other acidosis: Secondary | ICD-10-CM | POA: Diagnosis present

## 2021-11-19 DIAGNOSIS — K219 Gastro-esophageal reflux disease without esophagitis: Secondary | ICD-10-CM | POA: Diagnosis present

## 2021-11-19 DIAGNOSIS — R112 Nausea with vomiting, unspecified: Secondary | ICD-10-CM | POA: Diagnosis not present

## 2021-11-19 LAB — URINALYSIS, ROUTINE W REFLEX MICROSCOPIC
Bilirubin Urine: NEGATIVE
Glucose, UA: >=500 mg/dL — AB
Ketones, ur: 5 mg/dL — AB
Leukocytes,Ua: NEGATIVE
Nitrite: NEGATIVE
Protein, ur: 30 mg/dL — AB
Specific Gravity, Urine: 1.001 — ABNORMAL LOW (ref 1.005–1.030)
pH: 7 (ref 5.0–8.0)

## 2021-11-19 LAB — COMPREHENSIVE METABOLIC PANEL
ALT: 94 U/L — ABNORMAL HIGH (ref 0–44)
AST: 198 U/L — ABNORMAL HIGH (ref 15–41)
Albumin: 2.8 g/dL — ABNORMAL LOW (ref 3.5–5.0)
Alkaline Phosphatase: 229 U/L — ABNORMAL HIGH (ref 38–126)
Anion gap: 12 (ref 5–15)
BUN: <5 mg/dL — ABNORMAL LOW (ref 6–20)
CO2: 20 mmol/L — ABNORMAL LOW (ref 22–32)
Calcium: 8.8 mg/dL — ABNORMAL LOW (ref 8.9–10.3)
Chloride: 104 mmol/L (ref 98–111)
Creatinine, Ser: 0.97 mg/dL (ref 0.44–1.00)
GFR, Estimated: >60 mL/min (ref >60)
Glucose, Bld: 160 mg/dL — ABNORMAL HIGH (ref 70–99)
Potassium: 2.6 mmol/L — CL (ref 3.5–5.1)
Sodium: 136 mmol/L (ref 135–145)
Total Bilirubin: 1.8 mg/dL — ABNORMAL HIGH (ref 0.3–1.2)
Total Protein: 6.1 g/dL — ABNORMAL LOW (ref 6.5–8.1)

## 2021-11-19 LAB — BASIC METABOLIC PANEL
Anion gap: 7 (ref 5–15)
BUN: <5 mg/dL — ABNORMAL LOW (ref 6–20)
CO2: 16 mmol/L — ABNORMAL LOW (ref 22–32)
Calcium: 8 mg/dL — ABNORMAL LOW (ref 8.9–10.3)
Chloride: 113 mmol/L — ABNORMAL HIGH (ref 98–111)
Creatinine, Ser: 1.02 mg/dL — ABNORMAL HIGH (ref 0.44–1.00)
GFR, Estimated: >60 mL/min (ref >60)
Glucose, Bld: 158 mg/dL — ABNORMAL HIGH (ref 70–99)
Potassium: 3.7 mmol/L (ref 3.5–5.1)
Sodium: 136 mmol/L (ref 135–145)

## 2021-11-19 LAB — BLOOD GAS, VENOUS
Acid-base deficit: 2.4 mmol/L — ABNORMAL HIGH (ref 0.0–2.0)
Bicarbonate: 21.6 mmol/L (ref 20.0–28.0)
O2 Saturation: 68.2 %
Patient temperature: 37.1
pCO2, Ven: 34 mmHg — ABNORMAL LOW (ref 44–60)
pH, Ven: 7.41 (ref 7.25–7.43)
pO2, Ven: 41 mmHg (ref 32–45)

## 2021-11-19 LAB — CBC WITH DIFFERENTIAL/PLATELET
Abs Immature Granulocytes: 0.03 10*3/uL (ref 0.00–0.07)
Basophils Absolute: 0.1 10*3/uL (ref 0.0–0.1)
Basophils Relative: 1 %
Eosinophils Absolute: 0.1 10*3/uL (ref 0.0–0.5)
Eosinophils Relative: 1 %
HCT: 30.9 % — ABNORMAL LOW (ref 36.0–46.0)
Hemoglobin: 10.3 g/dL — ABNORMAL LOW (ref 12.0–15.0)
Immature Granulocytes: 0 %
Lymphocytes Relative: 24 %
Lymphs Abs: 1.6 10*3/uL (ref 0.7–4.0)
MCH: 35.4 pg — ABNORMAL HIGH (ref 26.0–34.0)
MCHC: 33.3 g/dL (ref 30.0–36.0)
MCV: 106.2 fL — ABNORMAL HIGH (ref 80.0–100.0)
Monocytes Absolute: 0.7 10*3/uL (ref 0.1–1.0)
Monocytes Relative: 10 %
Neutro Abs: 4.4 10*3/uL (ref 1.7–7.7)
Neutrophils Relative %: 64 %
Platelets: 82 10*3/uL — ABNORMAL LOW (ref 150–400)
RBC: 2.91 MIL/uL — ABNORMAL LOW (ref 3.87–5.11)
RDW: 16.9 % — ABNORMAL HIGH (ref 11.5–15.5)
WBC: 6.8 10*3/uL (ref 4.0–10.5)
nRBC: 0 % (ref 0.0–0.2)

## 2021-11-19 LAB — CBG MONITORING, ED
Glucose-Capillary: 56 mg/dL — ABNORMAL LOW (ref 70–99)
Glucose-Capillary: 143 mg/dL — ABNORMAL HIGH (ref 70–99)
Glucose-Capillary: 151 mg/dL — ABNORMAL HIGH (ref 70–99)
Glucose-Capillary: 183 mg/dL — ABNORMAL HIGH (ref 70–99)

## 2021-11-19 LAB — RAPID URINE DRUG SCREEN, HOSP PERFORMED
Amphetamines: NOT DETECTED
Barbiturates: NOT DETECTED
Benzodiazepines: NOT DETECTED
Cocaine: NOT DETECTED
Opiates: NOT DETECTED
Tetrahydrocannabinol: NOT DETECTED

## 2021-11-19 LAB — BILIRUBIN, DIRECT: Bilirubin, Direct: 0.8 mg/dL — ABNORMAL HIGH (ref 0.0–0.2)

## 2021-11-19 LAB — GLUCOSE, CAPILLARY
Glucose-Capillary: 181 mg/dL — ABNORMAL HIGH (ref 70–99)
Glucose-Capillary: 254 mg/dL — ABNORMAL HIGH (ref 70–99)

## 2021-11-19 LAB — HEMOGLOBIN A1C
Hgb A1c MFr Bld: 7.6 % — ABNORMAL HIGH (ref 4.8–5.6)
Mean Plasma Glucose: 171.42 mg/dL

## 2021-11-19 LAB — MAGNESIUM: Magnesium: 1.9 mg/dL (ref 1.7–2.4)

## 2021-11-19 LAB — GAMMA GT: GGT: 1054 U/L — ABNORMAL HIGH (ref 7–50)

## 2021-11-19 LAB — BETA-HYDROXYBUTYRIC ACID: Beta-Hydroxybutyric Acid: 3.07 mmol/L — ABNORMAL HIGH (ref 0.05–0.27)

## 2021-11-19 LAB — PROTIME-INR
INR: 1.1 (ref 0.8–1.2)
Prothrombin Time: 14.3 seconds (ref 11.4–15.2)

## 2021-11-19 MED ORDER — POTASSIUM CHLORIDE CRYS ER 20 MEQ PO TBCR
40.0000 meq | EXTENDED_RELEASE_TABLET | Freq: Once | ORAL | Status: AC
  Administered 2021-11-19: 40 meq via ORAL
  Filled 2021-11-19: qty 2

## 2021-11-19 MED ORDER — GABAPENTIN 300 MG PO CAPS
300.0000 mg | ORAL_CAPSULE | Freq: Three times a day (TID) | ORAL | Status: DC
  Administered 2021-11-19 – 2021-11-20 (×2): 300 mg via ORAL
  Filled 2021-11-19 (×3): qty 1

## 2021-11-19 MED ORDER — MOMETASONE FURO-FORMOTEROL FUM 100-5 MCG/ACT IN AERO
2.0000 | INHALATION_SPRAY | Freq: Two times a day (BID) | RESPIRATORY_TRACT | Status: DC
Start: 2021-11-19 — End: 2021-11-20
  Administered 2021-11-19 – 2021-11-20 (×2): 2 via RESPIRATORY_TRACT
  Filled 2021-11-19: qty 8.8

## 2021-11-19 MED ORDER — ENSURE ENLIVE PO LIQD: 237.0000 mL | Freq: Two times a day (BID) | ORAL | Status: DC

## 2021-11-19 MED ORDER — FAMOTIDINE 20 MG PO TABS
40.0000 mg | ORAL_TABLET | Freq: Two times a day (BID) | ORAL | Status: DC
  Administered 2021-11-19 – 2021-11-20 (×3): 40 mg via ORAL
  Filled 2021-11-19 (×3): qty 2

## 2021-11-19 MED ORDER — LORAZEPAM 2 MG/ML IJ SOLN
1.0000 mg | Freq: Once | INTRAMUSCULAR | Status: AC
  Administered 2021-11-19: 1 mg via INTRAVENOUS
  Filled 2021-11-19: qty 1

## 2021-11-19 MED ORDER — DEXTROSE 50 % IV SOLN
1.0000 | INTRAVENOUS | Status: DC | PRN
  Filled 2021-11-19: qty 50

## 2021-11-19 MED ORDER — POTASSIUM CHLORIDE 10 MEQ/100ML IV SOLN
10.0000 meq | INTRAVENOUS | Status: AC
  Administered 2021-11-19 (×6): 10 meq via INTRAVENOUS
  Filled 2021-11-19 (×6): qty 100

## 2021-11-19 MED ORDER — MONTELUKAST SODIUM 10 MG PO TABS
10.0000 mg | ORAL_TABLET | Freq: Every day | ORAL | Status: DC
Start: 2021-11-19 — End: 2021-11-20
  Administered 2021-11-19: 10 mg via ORAL
  Filled 2021-11-19: qty 1

## 2021-11-19 MED ORDER — SODIUM CHLORIDE 0.9 % IV BOLUS
2000.0000 mL | Freq: Once | INTRAVENOUS | Status: AC
  Administered 2021-11-19: 2000 mL via INTRAVENOUS

## 2021-11-19 MED ORDER — PANTOPRAZOLE SODIUM 40 MG PO TBEC
40.0000 mg | DELAYED_RELEASE_TABLET | Freq: Every day | ORAL | Status: DC
  Administered 2021-11-19 – 2021-11-20 (×2): 40 mg via ORAL
  Filled 2021-11-19 (×2): qty 1

## 2021-11-19 MED ORDER — LOPERAMIDE HCL 2 MG PO CAPS
2.0000 mg | ORAL_CAPSULE | ORAL | Status: DC | PRN
  Administered 2021-11-19: 2 mg via ORAL
  Filled 2021-11-19: qty 1

## 2021-11-19 MED ORDER — ALBUTEROL SULFATE (2.5 MG/3ML) 0.083% IN NEBU: 2.5000 mg | INHALATION_SOLUTION | RESPIRATORY_TRACT | Status: DC | PRN

## 2021-11-19 NOTE — Progress Notes (Signed)
PROGRESS NOTE    Anna Garcia  ZJQ:[id number]  DOB: 11-Feb-1981  DOA: 11/18/2021 PCP: Day, [name], MD Outpatient Specialists:   Hospital course:  41 year old with DM 1 and known gastroparesis was admitted yesterday for intractable nausea and vomiting and significant hypokalemia.   Subjective:  Patient states that her vomiting is improved and she was even able to eat some eggs and sausage this morning.  She has not had any further vomiting but is still having some dry heaves.  The gastric discomfort is also improved.   Objective: Vitals:   11/19/21 1059 11/19/21 1200 11/19/21 1446 11/19/21 1446  BP: 104/71 97/67 102/69 102/69  Pulse: 96 94 99 99  Resp: 20 17 18 18   Temp: 98 F (36.7 C)  98.3 F (36.8 C) 98.3 F (36.8 C)  TempSrc: Oral  Oral   SpO2: 97% 100% 100% 100%  Weight:      Height:        Intake/Output Summary (Last 24 hours) at 11/19/2021 1743 Last data filed at 11/19/2021 2409 Gross per 24 hour  Intake 1295.89 ml  Output --  Net 1295.89 ml   Filed Weights   11/18/21 1915  Weight: 77.1 kg     Exam:  General: Patient looking younger than stated age sitting up in bed with breakfast in front of her looking tired. Eyes: sclera anicteric, conjuctiva mild injection bilaterally CVS: S1-S2, regular  Respiratory:  decreased air entry bilaterally secondary to decreased inspiratory effort, rales at bases  GI: Slightly distended, NABS, soft, NT  LE: No edema.  Neuro: A/O x 3, Moving all extremities equally with normal strength, CN 3-12 intact, grossly nonfocal.   Assessment & Plan:   Nausea and vomiting secondary to gastroparesis Improved with IV fluid resuscitation, as needed Zofran and Reglan Will change diet to bland diet Continue IV fluid resuscitation Continue PPI  Hypokalemia Repeat K still 2.6 despite supplementation yesterday  IV and p.o. potassium ordered, recheck tonight and continue to aggressively replete till normalized  DM1 Blood  sugars under reasonable control on present regimen  Transaminitis and elevated alk phos Transaminitis improved overnight although alk phos remains elevated RUQ US shows persistent CBD dilation without evidence of cholecystitis, also noted to have heterogenous appearance to the liver. Patient without Percell Miller sign and no RUQ tenderness If N/V does not improve, can consider MRCP if warranted.  Metabolic acidosis Resolved with present management  Moderate persistent asthma No evidence of exacerbation, continue Dulera and as needed albuterol   DVT prophylaxis: SCD Code Status: Full Family Communication: None Disposition Plan:   Patient is from: Home  Anticipated Discharge Location: Home  Barriers to Discharge: Ongoing hypokalemia and nausea  Is patient medically stable for Discharge: Not yet   Scheduled Meds:  famotidine  40 mg Oral BID   [START ON 11/20/2021] feeding supplement  237 mL Oral BID BM   insulin aspart  0-15 Units Subcutaneous TID WC   insulin glargine-yfgn  10 Units Subcutaneous QHS   mometasone-formoterol  2 puff Inhalation BID   montelukast  10 mg Oral QHS   pantoprazole  40 mg Oral Daily   Continuous Infusions:  lactated ringers 125 mL/hr at 11/19/21 1415    Data Reviewed:  Basic Metabolic Panel: Recent Labs  Lab 11/18/21 2022 11/19/21 0500  NA 136 136  K 2.6* 2.6*  CL 101 104  CO2 15* 20*  GLUCOSE 269* 160*  BUN <5* <5*  CREATININE 1.20* 0.97  CALCIUM 9.5 8.8*  MG 2.2  1.9    CBC: Recent Labs  Lab 11/18/21 2022 11/19/21 0500  WBC 7.5 6.8  NEUTROABS  --  4.4  HGB 12.7 10.3*  HCT 38.1 30.9*  MCV 106.4* 106.2*  PLT 122* 82*    Studies: US Abdomen Limited RUQ (LIVER/GB)  Result Date: 11/18/2021 CLINICAL DATA:  Vomiting. EXAM: ULTRASOUND ABDOMEN LIMITED RIGHT UPPER QUADRANT COMPARISON:  Abdominal ultrasound 09/24/2021 FINDINGS: Gallbladder: No gallstones or wall thickening visualized. No sonographic Murphy sign noted by sonographer. Common  bile duct: Diameter: 7 mm, unchanged. Liver: No focal lesion identified. Increased echogenicity with diffusely coarsened heterogeneous echotexture. Portal vein is patent on color Doppler imaging with normal direction of blood flow towards the liver. Other: None. IMPRESSION: 1. The common bile duct is mildly enlarged measuring 7 mm. This is unchanged from the prior examination. Recommend correlation with lab values. 2. No cholelithiasis. 3. Echogenic heterogeneous liver echotexture may be related to diffuse hepatocellular disease. This can be further evaluated with liver MRI if clinically warranted. Electronically Signed   By: Ronney Asters M.D.   On: 11/18/2021 22:19   DG Chest Portable 1 View  Result Date: 11/18/2021 CLINICAL DATA:  Shortness of breath and vomiting EXAM: PORTABLE CHEST 1 VIEW COMPARISON:  09/22/2021 FINDINGS: The heart size and mediastinal contours are within normal limits. Both lungs are clear. The visualized skeletal structures are unremarkable. IMPRESSION: No active disease. Electronically Signed   By: Inez Catalina M.D.   On: 11/18/2021 21:41    Principal Problem:   Intractable nausea and vomiting Active Problems:   Transaminitis   Hypokalemia   High anion gap metabolic acidosis   GERD (gastroesophageal reflux disease)   Moderate persistent asthma     Desiree Daise Derek Jack, [facility]  If 7PM-7AM, please contact night-coverage [email or web address]   LOS: 0 days

## 2021-11-19 NOTE — H&P (Signed)
History and Physical    PLEASE NOTE THAT DRAGON DICTATION SOFTWARE WAS USED IN THE CONSTRUCTION OF THIS NOTE.   Anna Garcia IOX:[id number] DOB: [birth year] DOA: 11/18/2021  PCP: Garcia, Anna Krauss, MD  Patient coming from: home   I have personally briefly reviewed patient's old medical records in [place]  Chief Complaint: Nausea/vomiting  HPI: Anna Garcia is a 41 y.o. 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   The patient reports that she has been experiencing persistent nausea resulting in nonbloody, nonbilious emesis for the last 1 to 2 months, but notes increased frequency of the episodes of vomiting over the course of the last few days, noting at least 2-3 daily episodes of nonbloody, nonbilious emesis over that timeframe.  She notes very limited oral intake over the last few days as a consequence of this.  Not associate with any diarrhea, melena, or hematochezia.  Reports mild epigastric discomfort, worsening with palpation over the abdomen.  This is sharp, nonradiating in nature.  No preceding trauma.  Denies any associated subjective prn chills or rigors, generalized myalgias.  No rash.  Denies any routine or recent alcohol consumption or any use of recreational drugs.  In the setting of a history of recurrent episodes of intractable nausea/vomiting, with multiple associated hospitalizations thereof, it appears that the patient underwent EGD on 09/24/2021.  She typically follows with gastroenterology at [facility].  Denies any associated chest pain, diaphoresis, palpitations, dizziness, presyncope, or syncope.  No recent dysuria or gross hematuria.  No neck stiffness,  rash.  She has a history of chronic transaminitis, with chart review revealing the following most recent transaminase results, drawn on 09/24/2021: Alk phos 124, AST 100, ALT 57, total bilirubin 0.5.      ED Course:  Vital signs in the ED were notable for the following: Afebrile; initial rate 105, decreased to 85 following interval IV fluids: Respiratory 17-19, oxygen saturation 97 or percent on room air.  Labs were notable for the following: CMP notable for potassium 2.6, bicarbonate 15, anion gap 20, creatinine 1.20, glucose 270, alkaline phosphatase 291, AST 245, ALT 126, total bilirubin 2.3.  Lipase 23.  CBC notable for white count 7500.  Urinalysis shows no blood cells.  Imaging and additional notable ED work-up: Chest x-ray shows no evidence of acute cardiopulmonary process.  Reported ultrasound shows persistent mild dilation of the common bile duct measuring 7 mm, without evidence of cholelithiasis or choledocholithiasis, and no evidence of acute cholecystitis, while showing echogenic heterogeneous liver findings, potentially related to diffuse hepatocellular disease.  While in the ED, the following were administered: Reglan 10 mg IV x1, potassium chloride 40 mg p.o. x1, potassium chloride 30 mill colons IV over 3 hours x 1, lactated Ringer's x1 L bolus.  Subsequently, the patient was admitted the patient was admitted for overnight observation for further evaluation management of presenting intractable nausea/vomiting, with presentation also notable for mild hypokalemia, acute on chronic transaminitis as well as anion gap metabolic acidosis in the absence of evidence of DKA.   Review of Systems: As per HPI otherwise 10 point review of systems negative.   Past Medical History:  Diagnosis Date   Diabetes mellitus    Gastroparesis    Hypertension    Neuropathy  Past Surgical History:  Procedure Laterality Date   ESOPHAGOGASTRODUODENOSCOPY (EGD) WITH PROPOFOL N/A 09/24/2021    Procedure: ESOPHAGOGASTRODUODENOSCOPY (EGD) WITH PROPOFOL;  Surgeon: Clarene Essex, MD;  Location: [facility] ENDOSCOPY;  Service: Gastroenterology;  Laterality: N/A;   SAVORY DILATION N/A 09/24/2021   Procedure: SAVORY DILATION - 16 MM;  Surgeon: Clarene Essex, MD;  Location: [facility] ENDOSCOPY;  Service: Gastroenterology;  Laterality: N/A;    Social History:  reports that she has quit smoking. Her smoking use included cigarettes. She smoked an average of .5 packs per Garcia. She has never used smokeless tobacco. She reports that she does not drink alcohol and does not use drugs.   No Known Allergies  Family History  Problem Relation Age of Onset   Hypertension Mother    Dementia Mother    Heart attack Father    Hypertension Father    Heart disease Father    Hypertension Brother    Stroke Maternal Grandfather     Family history reviewed and not pertinent    Prior to Admission medications   Medication Sig Start Date End Date Taking? Authorizing Provider  BAQSIMI TWO PACK 3 MG/DOSE POWD Place 1 spray into both nostrils as needed. 10/30/21  Yes [provider]  DEXILANT 60 MG capsule Take 1 capsule (60 mg total) by mouth daily. 09/26/21  Yes Regalado, Belkys A, MD  famotidine (PEPCID) 40 MG tablet Take 40 mg by mouth 2 (two) times daily. 05/28/19  Yes [provider]  gabapentin (NEURONTIN) 300 MG capsule Take 1 capsule (300 mg total) by mouth 3 (three) times daily. 05/28/21  Yes Eugenie Filler, MD  granisetron (KYTRIL) 1 MG tablet Take 1 mg by mouth every 12 (twelve) hours. 11/05/21  Yes [provider]  insulin glargine (LANTUS SOLOSTAR) 100 UNIT/ML Solostar Pen Inject 22 Units into the skin daily. Patient taking differently: Inject 10 Units into the skin at bedtime. 09/26/21  Yes Regalado, Belkys A, MD  insulin glulisine (APIDRA SOLOSTAR) 100 UNIT/ML Solostar Pen Inject 1-15 Units into the skin See admin instructions. Changes as per carb intake as needed high CBG 07/13/20  Yes  [provider]  loperamide (IMODIUM) 2 MG capsule Take 1 capsule (2 mg total) by mouth as needed for diarrhea or loose stools. 09/26/21  Yes Regalado, Belkys A, MD  mometasone-formoterol (DULERA) 100-5 MCG/ACT AERO Inhale 2 puffs into the lungs 2 (two) times daily. 07/07/20  Yes Mercy Riding, MD  montelukast (SINGULAIR) 10 MG tablet Take 10 mg by mouth at bedtime. 05/24/19  Yes [provider]  MOTEGRITY 1 MG TABS Take 1 mg by mouth daily. 08/31/21  Yes [provider]  rosuvastatin (CRESTOR) 20 MG tablet Take 20 mg by mouth daily. 05/24/19  Yes [provider]  Vitamin D, Ergocalciferol, (DRISDOL) 1.25 MG (50000 UNIT) CAPS capsule Take 1 capsule (50,000 Units total) by mouth every 7 (seven) days. Patient not taking: Reported on 11/18/2021 10/02/21   Regalado, Jerald Kief A, MD  insulin aspart (NOVOLOG) 100 UNIT/ML injection Inject 3 units three times a Garcia with meals if you eat over 50% of your meals. In addition, use sliding scale as follows: CBG 70 - 120: 0 units CBG 121 - 150: 2 units CBG 151 - 200: 3 units CBG 201 - 250: 5 units CBG 251 - 300: 8 units CBG 301 - 350: 11 units CBG 351 - 400: 15 units CBG > 400: call MD and obtain STAT lab verification Patient not taking: No sig reported 07/07/20 10/13/20  Mercy Riding, MD     Objective    Physical Exam: Vitals:   11/19/21 0023 11/19/21 0100 11/19/21 0430 11/19/21 0719  BP:  119/79 109/71 92/65  Pulse:  92 96 92  Resp:  14 16 17   Temp: 98.9 F (37.2 C)   98.7 F (37.1 C)  TempSrc:    Oral  SpO2:  100% 100% 98%  Weight:      Height:        General: appears to be stated age; alert, oriented Skin: warm, dry, no rash Head:  AT/Declo Mouth:  Oral mucosa membranes appear dry, normal dentition Neck: supple; trachea midline Heart:  RRR; did not appreciate any M/R/G Lungs: CTAB, did not appreciate any wheezes, rales, or rhonchi Abdomen: + BS; soft, ND, NT Vascular: 2+ pedal pulses b/l; 2+ radial pulses  b/l Extremities: no peripheral edema, no muscle wasting Neuro: strength and sensation intact in upper and lower extremities b/l     Labs on Admission: I have personally reviewed following labs and imaging studies  CBC: Recent Labs  Lab 11/18/21 2022 11/19/21 0500  WBC 7.5 6.8  NEUTROABS  --  4.4  HGB 12.7 10.3*  HCT 38.1 30.9*  MCV 106.4* 106.2*  PLT 122* 82*   Basic Metabolic Panel: Recent Labs  Lab 11/18/21 2022 11/19/21 0500  NA 136 136  K 2.6* 2.6*  CL 101 104  CO2 15* 20*  GLUCOSE 269* 160*  BUN <5* <5*  CREATININE 1.20* 0.97  CALCIUM 9.5 8.8*  MG 2.2 1.9   GFR: Estimated Creatinine Clearance: 77.5 mL/min (by C-G formula based on SCr of 0.97 mg/dL). Liver Function Tests: Recent Labs  Lab 11/18/21 2022 11/19/21 0500  AST 245* 198*  ALT 126* 94*  ALKPHOS 291* 229*  BILITOT 2.3* 1.8*  PROT 7.7 6.1*  ALBUMIN 3.6 2.8*   Recent Labs  Lab 11/18/21 2022  LIPASE 23   No results for input(s): "AMMONIA" in the last 168 hours. Coagulation Profile: No results for input(s): "INR", "PROTIME" in the last 168 hours. Cardiac Enzymes: No results for input(s): "CKTOTAL", "CKMB", "CKMBINDEX", "TROPONINI" in the last 168 hours. BNP (last 3 results) No results for input(s): "PROBNP" in the last 8760 hours. HbA1C: Recent Labs    11/18/21 2022  HGBA1C 7.6*   CBG: Recent Labs  Lab 11/18/21 1915 11/18/21 2336 11/19/21 0244 11/19/21 0422  GLUCAP 316* 196* 56* 143*   Lipid Profile: No results for input(s): "CHOL", "HDL", "LDLCALC", "TRIG", "CHOLHDL", "LDLDIRECT" in the last 72 hours. Thyroid Function Tests: No results for input(s): "TSH", "T4TOTAL", "FREET4", "T3FREE", "THYROIDAB" in the last 72 hours. Anemia Panel: No results for input(s): "VITAMINB12", "FOLATE", "FERRITIN", "TIBC", "IRON", "RETICCTPCT" in the last 72 hours. Urine analysis:    Component Value Date/Time   COLORURINE YELLOW 11/19/2021 0138   APPEARANCEUR HAZY (A) 11/19/2021 0138    LABSPEC 1.001 (L) 11/19/2021 0138   PHURINE 7.0 11/19/2021 0138   GLUCOSEU >=500 (A) 11/19/2021 0138   HGBUR MODERATE (A) 11/19/2021 0138   BILIRUBINUR NEGATIVE 11/19/2021 0138   KETONESUR 5 (A) 11/19/2021 0138   PROTEINUR 30 (A) 11/19/2021 0138   UROBILINOGEN 0.2 04/11/2011 1437   NITRITE NEGATIVE 11/19/2021 0138   LEUKOCYTESUR NEGATIVE 11/19/2021 0138    Radiological Exams on Admission: US Abdomen Limited RUQ (LIVER/GB)  Result Date: 11/18/2021 CLINICAL DATA:  Vomiting. EXAM: ULTRASOUND ABDOMEN LIMITED RIGHT UPPER QUADRANT COMPARISON:  Abdominal ultrasound 09/24/2021 FINDINGS: Gallbladder: No gallstones or wall thickening visualized. No sonographic Murphy sign noted by sonographer.  Common bile duct: Diameter: 7 mm, unchanged. Liver: No focal lesion identified. Increased echogenicity with diffusely coarsened heterogeneous echotexture. Portal vein is patent on color Doppler imaging with normal direction of blood flow towards the liver. Other: None. IMPRESSION: 1. The common bile duct is mildly enlarged measuring 7 mm. This is unchanged from the prior examination. Recommend correlation with lab values. 2. No cholelithiasis. 3. Echogenic heterogeneous liver echotexture may be related to diffuse hepatocellular disease. This can be further evaluated with liver MRI if clinically warranted. Electronically Signed   By: Ronney Asters M.D.   On: 11/18/2021 22:19   DG Chest Portable 1 View  Result Date: 11/18/2021 CLINICAL DATA:  Shortness of breath and vomiting EXAM: PORTABLE CHEST 1 VIEW COMPARISON:  09/22/2021 FINDINGS: The heart size and mediastinal contours are within normal limits. Both lungs are clear. The visualized skeletal structures are unremarkable. IMPRESSION: No active disease. Electronically Signed   By: Inez Catalina M.D.   On: 11/18/2021 21:41     Assessment/Plan    Principal Problem:   Intractable nausea and vomiting Active Problems:   Transaminitis   Hypokalemia   High anion gap  metabolic acidosis   GERD (gastroesophageal reflux disease)   Moderate persistent asthma      #) intractable nausea/vomiting: Recurrent history of diarrhea, with multiple prior hospitalizations, with patient reporting early 2 months of daily nausea/vomiting, with recent increase in frequency thereof.  Source not entirely clear, although she does have a history of poorly controlled type 1 diabetes with associated gastroparesis, representing a potential contributing factor.  No reported use of recreational drugs, will check urinary drug screen to further evaluate.  No NSAIDs or alcohol use.  At this time, patient unable to tolerate p.o. for discharge.  Plan: 1 L LR bolus followed by continuous LR 120 cc/h.  As needed IV Zofran.  As needed IV Ativan for nausea/vomiting refractory to Zofran.  Consider magnesium level.  Repeat CMP in the morning.  Monitor strict I's and O's and daily weights.         #) Hypokalemia: Presenting serum potassium level noted to be 2.6.  She is status post 40 mill colons oral potassium chloride as well as 3 mEq of IV potassium chloride administered in the ED today this is in the setting of increased GI losses, in the form of presenting nausea/vomiting as above.   Plan: Monitor oximetry.  Assuming disease in level.  Repeat CMP in the morning.  Further evaluation management presenting intractable nausea/vomiting, as above.         #(acute on chronic transaminitis: In the setting of a documented history of chronic transaminitis, today's liver enzymes show interval increase in both AST and ALT as well as interval development of mild elevation in alk phos and total bilirubin, potentially showing early cholestatic pattern, although current degree of elevation of alk phos and T. bili are not on par with degree of cholestasis.  Upper quadrant ultrasound continues to show common bile duct dilation without evidence of choledocholithiasis or cholelithiasis for that matter.   May consider additional imaging, including MRCP to further evaluate.  Quadrant ultrasound also shows echogenic heterogeneous appearance of the liver, potentially representing diffuse hepatocellular disease.  The patient follows with gastroenterology as an outpatient, as above  Plan: Repeat CMP in the morning.  Check direct bilirubin, GGT.  Check INR.  Urinary drug screen.  Hold home rosuvastatin.         #) Anion gap metabolic acidosis: As reflected on presenting CMP.  While initial blood sugars mildly elevated to 70, presentation does not appear consistent with DKA, as blood gas does not show evidence of metabolic acidemia.  We will add on beta hydroxybutyric acid and repeat VBG in the morning to further assess.  There are alternate explanations for the patient's anion gap metabolic acidosis, including appearance of diffuse hepatocellular disease, which can be associated with lactic acidosis: In addition to likely starvation keto lactic acidosis as a consequence of very little oral intake over the last several days in the setting of intractable nausea/vomiting.  No evidence of underlying infectious process for consideration of sepsis.  Plan: In terms of addressing any early/evolving DKA, will provide 1 more liter of LR bolus followed by initiation continuous LR at 125 cc/h.  Resuming basal insulin, versus now.  NovoLog 6 units subcu x1 now, with repeat CBG in 1 hour.  Before every meal and at bedtime CBG monitoring with sliding scale insulin.  VBG in the morning.  Add on beta hydroxybutyric acid.  Check INR.  Further evaluation management of acute on chronic transaminitis, as above.  Add on hemoglobin A1c.          #) GERD: documented h/o such; on Dexilant as outpatient.   Plan: continue home PPI.           #) Moderate persistent asthma: Documented history of such, without clinical evidence to suggest acute exacerbation at this time.  On scheduled Dulera as an  outpatient.  Plan: Continue Dulera.  Prn albuterol nebulizers.  Add on serum magnesium level.       DVT prophylaxis: SCD's   Code Status: Full code Family Communication: none Disposition Plan: Per Rounding Team Consults called: none;  Admission status: Observation   PLEASE NOTE THAT DRAGON DICTATION SOFTWARE WAS USED IN THE CONSTRUCTION OF THIS NOTE.   [place] DO [facility] From [place]   11/19/2021, 7:50 AM

## 2021-11-20 DIAGNOSIS — R112 Nausea with vomiting, unspecified: Secondary | ICD-10-CM | POA: Diagnosis not present

## 2021-11-20 LAB — BASIC METABOLIC PANEL
Anion gap: 10 (ref 5–15)
BUN: <5 mg/dL — ABNORMAL LOW (ref 6–20)
CO2: 16 mmol/L — ABNORMAL LOW (ref 22–32)
Calcium: 8.6 mg/dL — ABNORMAL LOW (ref 8.9–10.3)
Chloride: 112 mmol/L — ABNORMAL HIGH (ref 98–111)
Creatinine, Ser: 0.96 mg/dL (ref 0.44–1.00)
GFR, Estimated: >60 mL/min (ref >60)
Glucose, Bld: 263 mg/dL — ABNORMAL HIGH (ref 70–99)
Potassium: 4 mmol/L (ref 3.5–5.1)
Sodium: 138 mmol/L (ref 135–145)

## 2021-11-20 LAB — GLUCOSE, CAPILLARY
Glucose-Capillary: 252 mg/dL — ABNORMAL HIGH (ref 70–99)
Glucose-Capillary: 265 mg/dL — ABNORMAL HIGH (ref 70–99)

## 2021-11-20 NOTE — TOC Transition Note (Signed)
Transition of Care [facility]) - CM/SW Discharge Note   Patient Details  Name: Anna Garcia MRN: [id number] Date of Birth: 07-15-1980  [facility] CM/SW Contact:  Leeroy Cha, RN Phone Number: 11/20/2021, 11:35 AM   Clinical Narrative:    Discharged to home with self care   Final next level of care: Home/Self Care Barriers to Discharge: Barriers Resolved   Patient Goals and CMS Choice Patient states their goals for this hospitalization and ongoing recovery are:: to go home CMS Medicare.gov Compare Post Acute Care list provided to:: Patient    Discharge Placement                       Discharge Plan and Services   Discharge Planning Services: CM Consult                                 Social Determinants of Health (SDOH) Interventions     Readmission Risk Interventions    09/24/2021    1:14 PM  Readmission Risk Prevention Plan  HRI or Home Care Consult Complete  Social Work Consult for [place] Planning/Counseling Complete  Palliative Care Screening Not Applicable

## 2021-11-20 NOTE — TOC Initial Note (Signed)
Transition of Care [facility]) - Initial/Assessment Note    Patient Details  Name: Anna Garcia MRN: [id number] Date of Birth: 12-31-1980  Transition of Care [facility]) CM/SW Contact:    Leeroy Cha, RN Phone Number: 11/20/2021, 9:38 AM  Clinical Narrative:                  Transition of Care [facility]) Screening Note   Patient Details  Name: Anna Garcia Date of Birth: 1980-10-13   Transition of Care [facility]) CM/SW Contact:    Leeroy Cha, RN Phone Number: 11/20/2021, 9:38 AM    Transition of Care Department [facility]) has reviewed patient and no TOC needs have been identified at this time. We will continue to monitor patient advancement through interdisciplinary progression rounds. If new patient transition needs arise, please place a TOC consult.    Expected Discharge Plan: Home/Self Care Barriers to Discharge: Continued Medical Work up   Patient Goals and CMS Choice Patient states their goals for this hospitalization and ongoing recovery are:: to go home CMS Medicare.gov Compare Post Acute Care list provided to:: Patient    Expected Discharge Plan and Services Expected Discharge Plan: Home/Self Care   Discharge Planning Services: CM Consult   Living arrangements for the past 2 months: Single Family Home                                      Prior Living Arrangements/Services Living arrangements for the past 2 months: Single Family Home Lives with:: Self Patient language and need for interpreter reviewed:: Yes Do you feel safe going back to the place where you live?: Yes            Criminal Activity/Legal Involvement Pertinent to Current Situation/Hospitalization: No - Comment as needed  Activities of Daily Living Home Assistive Devices/Equipment: None ADL Screening (condition at time of admission) Patient's cognitive ability adequate to safely complete daily activities?: Yes Is the patient deaf or have difficulty hearing?: No Does the patient have  difficulty seeing, even when wearing glasses/contacts?: No Does the patient have difficulty concentrating, remembering, or making decisions?: Yes Patient able to express need for assistance with ADLs?: Yes Does the patient have difficulty dressing or bathing?: No Independently performs ADLs?: Yes (appropriate for developmental age) Does the patient have difficulty walking or climbing stairs?: Yes Weakness of Legs: Both Weakness of Arms/Hands: Both  Permission Sought/Granted                  Emotional Assessment Appearance:: Appears stated age Attitude/Demeanor/Rapport: Engaged Affect (typically observed): Calm Orientation: : Oriented to Self, Oriented to Place, Oriented to  Time, Oriented to Situation Alcohol / Substance Use: Never Used Psych Involvement: No (comment)  Admission diagnosis:  Hypokalemia [E87.6] Abnormal LFTs [R79.89] Intractable nausea and vomiting [R11.2] Patient Active Problem List   Diagnosis Date Noted   High anion gap metabolic acidosis 02/05/5101   GERD (gastroesophageal reflux disease) 11/19/2021   Moderate persistent asthma 11/19/2021   Intractable nausea and vomiting 11/18/2021   Hypomagnesemia 09/23/2021   Hypophosphatemia 09/23/2021   Fever 05/27/2021   Left genital labial abscess 05/25/2021   Sinus tachycardia    Labial swelling: Left 05/24/2021   Vulvar abscess 05/24/2021   Elevated brain natriuretic peptide (BNP) level 05/22/2021   Overweight (BMI 25.0-29.9) 05/22/2021   Hypokalemia 05/21/2021   DKA, type 1 ([place]) 10/12/2020   Fibromyalgia 10/12/2020   CKD stage G3a/A3,  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 [place] [address] Phone: [phone] Fax: [phone]     Social Determinants of Health ([place]) Interventions    Readmission Risk Interventions    09/24/2021    1:14 PM  Readmission Risk Prevention Plan  HRI or [place] Complete  Social Work Consult for [place] Planning/Counseling Complete  Palliative Care Screening Not Applicable

## 2021-11-21 NOTE — Discharge Summary (Signed)
Physician Discharge Summary   Anna Garcia FYB:[id number] DOB: [birth year] DOA: 11/18/2021  PCP: [name], MD  Admit date: 11/18/2021 Discharge date: 11/20/2021   Admitted From: Home Disposition:  Home Discharging physician: Dwyane Dee, MD  Recommendations for Outpatient Follow-up:  Continue outpatient chronic management   Home Health:  Equipment/Devices:   Discharge Condition: stable CODE STATUS: Full Diet recommendation:  Diet Orders (From admission, onward)     Start     Ordered   11/20/21 0000  Diet Carb Modified        11/20/21 1034            Hospital Course:  Ms. Gladhill is a 41 yo female with PMH Type 1 DM, gastroparesis who presented with intractable nausea/vomiting.  She has a history of similar.  She is also followed closely outpatient with GI in [place].  She last underwent EGD on 09/24/2021. She was started on fluids and antiemetics.  She had gradual improvement of her symptoms and was able to tolerate a diet.  Etiology of her presentation was considered due to ongoing gastroparesis.  She declined any further antiemetics at discharge and was amenable with discharge home.   The patient's chronic medical conditions were treated accordingly per the patient's home medication regimen except as noted.  On day of discharge, patient was felt deemed stable for discharge. Patient/family member advised to call PCP or come back to ER if needed.   Principal Diagnosis: Intractable nausea and vomiting  Discharge Diagnoses: Active Hospital Problems   Diagnosis Date Noted   Intractable nausea and vomiting 11/18/2021   High anion gap metabolic acidosis 52/77/8242   GERD (gastroesophageal reflux disease) 11/19/2021   Moderate persistent asthma 11/19/2021   Hypokalemia 05/21/2021   Transaminitis 07/05/2020    Resolved Hospital Problems  No resolved problems to display.     Discharge Instructions     Diet Carb Modified   Complete by: As directed    Increase activity  slowly   Complete by: As directed       Allergies as of 11/20/2021   No Known Allergies      Medication List     TAKE these medications    Apidra SoloStar 100 UNIT/ML Solostar Pen Generic drug: insulin glulisine Inject 1-15 Units into the skin See admin instructions. Changes as per carb intake as needed high CBG   Baqsimi Two Pack 3 MG/DOSE Powd Generic drug: Glucagon Place 1 spray into both nostrils as needed.   Dexilant 60 MG capsule Generic drug: dexlansoprazole Take 1 capsule (60 mg total) by mouth daily.   famotidine 40 MG tablet Commonly known as: PEPCID Take 40 mg by mouth 2 (two) times daily.   gabapentin 300 MG capsule Commonly known as: NEURONTIN Take 1 capsule (300 mg total) by mouth 3 (three) times daily.   granisetron 1 MG tablet Commonly known as: KYTRIL Take 1 mg by mouth every 12 (twelve) hours.   Lantus SoloStar 100 UNIT/ML Solostar Pen Generic drug: insulin glargine Inject 22 Units into the skin daily. What changed:  how much to take when to take this   loperamide 2 MG capsule Commonly known as: IMODIUM Take 1 capsule (2 mg total) by mouth as needed for diarrhea or loose stools.   mometasone-formoterol 100-5 MCG/ACT Aero Commonly known as: DULERA Inhale 2 puffs into the lungs 2 (two) times daily.   montelukast 10 MG tablet Commonly known as: SINGULAIR Take 10 mg by mouth at bedtime.   Motegrity 1 MG Tabs  Generic drug: Prucalopride Succinate Take 1 mg by mouth daily.   rosuvastatin 20 MG tablet Commonly known as: CRESTOR Take 20 mg by mouth daily.   Vitamin D (Ergocalciferol) 1.25 MG (50000 UNIT) Caps capsule Commonly known as: DRISDOL Take 1 capsule (50,000 Units total) by mouth every 7 (seven) days.        No Known Allergies  Consultations:   Procedures:   Discharge Exam: BP 96/66 (BP Location: Right Arm)   Pulse 99   Temp 98.2 F (36.8 C) (Oral)   Resp 14   Ht '5\' 4"'$  (1.626 m)   Wt 77.1 kg   SpO2 99%   BMI  29.18 kg/m  Physical Exam Constitutional:      Appearance: Normal appearance.  HENT:     Head: Normocephalic and atraumatic.     Mouth/Throat:     Mouth: Mucous membranes are moist.  Eyes:     Extraocular Movements: Extraocular movements intact.  Cardiovascular:     Rate and Rhythm: Normal rate and regular rhythm.  Pulmonary:     Effort: Pulmonary effort is normal.     Breath sounds: Normal breath sounds.  Abdominal:     General: Bowel sounds are normal. There is no distension.     Palpations: Abdomen is soft.  Musculoskeletal:        General: Normal range of motion.     Cervical back: Normal range of motion and neck supple.  Skin:    General: Skin is warm and dry.  Neurological:     General: No focal deficit present.     Mental Status: She is alert.  Psychiatric:        Mood and Affect: Mood normal.        Behavior: Behavior normal.      The results of significant diagnostics from this hospitalization (including imaging, microbiology, ancillary and laboratory) are listed below for reference.   Microbiology: No results found for this or any previous visit (from the past 240 hour(s)).   Labs: BNP (last 3 results) Recent Labs    05/21/21 1110  BNP 852.7*   Basic Metabolic Panel: Recent Labs  Lab 11/18/21 2022 11/19/21 0500 11/19/21 1755 11/20/21 0327  NA 136 136 136 138  K 2.6* 2.6* 3.7 4.0  CL 101 104 113* 112*  CO2 15* 20* 16* 16*  GLUCOSE 269* 160* 158* 263*  BUN <5* <5* <5* <5*  CREATININE 1.20* 0.97 1.02* 0.96  CALCIUM 9.5 8.8* 8.0* 8.6*  MG 2.2 1.9  --   --    Liver Function Tests: Recent Labs  Lab 11/18/21 2022 11/19/21 0500  AST 245* 198*  ALT 126* 94*  ALKPHOS 291* 229*  BILITOT 2.3* 1.8*  PROT 7.7 6.1*  ALBUMIN 3.6 2.8*   Recent Labs  Lab 11/18/21 2022  LIPASE 23   No results for input(s): "AMMONIA" in the last 168 hours. CBC: Recent Labs  Lab 11/18/21 2022 11/19/21 0500  WBC 7.5 6.8  NEUTROABS  --  4.4  HGB 12.7 10.3*   HCT 38.1 30.9*  MCV 106.4* 106.2*  PLT 122* 82*   Cardiac Enzymes: No results for input(s): "CKTOTAL", "CKMB", "CKMBINDEX", "TROPONINI" in the last 168 hours. BNP: Invalid input(s): "POCBNP" CBG: Recent Labs  Lab 11/19/21 1201 11/19/21 1557 11/19/21 2121 11/20/21 0442 11/20/21 0744  GLUCAP 183* 181* 254* 252* 265*   D-Dimer No results for input(s): "DDIMER" in the last 72 hours. Hgb A1c Recent Labs    11/18/21 2022  HGBA1C 7.6*  Lipid Profile No results for input(s): "CHOL", "HDL", "LDLCALC", "TRIG", "CHOLHDL", "LDLDIRECT" in the last 72 hours. Thyroid function studies No results for input(s): "TSH", "T4TOTAL", "T3FREE", "THYROIDAB" in the last 72 hours.  Invalid input(s): "FREET3" Anemia work up No results for input(s): "VITAMINB12", "FOLATE", "FERRITIN", "TIBC", "IRON", "RETICCTPCT" in the last 72 hours. Urinalysis    Component Value Date/Time   COLORURINE YELLOW 11/19/2021 0138   APPEARANCEUR HAZY (A) 11/19/2021 0138   LABSPEC 1.001 (L) 11/19/2021 0138   PHURINE 7.0 11/19/2021 0138   GLUCOSEU >=500 (A) 11/19/2021 0138   HGBUR MODERATE (A) 11/19/2021 0138   BILIRUBINUR NEGATIVE 11/19/2021 0138   KETONESUR 5 (A) 11/19/2021 0138   PROTEINUR 30 (A) 11/19/2021 0138   UROBILINOGEN 0.2 04/11/2011 1437   NITRITE NEGATIVE 11/19/2021 0138   LEUKOCYTESUR NEGATIVE 11/19/2021 0138   Sepsis Labs Recent Labs  Lab 11/18/21 2022 11/19/21 0500  WBC 7.5 6.8   Microbiology No results found for this or any previous visit (from the past 240 hour(s)).  Procedures/Studies: US Abdomen Limited RUQ (LIVER/GB)  Result Date: 11/18/2021 CLINICAL DATA:  Vomiting. EXAM: ULTRASOUND ABDOMEN LIMITED RIGHT UPPER QUADRANT COMPARISON:  Abdominal ultrasound 09/24/2021 FINDINGS: Gallbladder: No gallstones or wall thickening visualized. No sonographic Murphy sign noted by sonographer. Common bile duct: Diameter: 7 mm, unchanged. Liver: No focal lesion identified. Increased echogenicity  with diffusely coarsened heterogeneous echotexture. Portal vein is patent on color Doppler imaging with normal direction of blood flow towards the liver. Other: None. IMPRESSION: 1. The common bile duct is mildly enlarged measuring 7 mm. This is unchanged from the prior examination. Recommend correlation with lab values. 2. No cholelithiasis. 3. Echogenic heterogeneous liver echotexture may be related to diffuse hepatocellular disease. This can be further evaluated with liver MRI if clinically warranted. Electronically Signed   By: Ronney Asters M.D.   On: 11/18/2021 22:19   DG Chest Portable 1 View  Result Date: 11/18/2021 CLINICAL DATA:  Shortness of breath and vomiting EXAM: PORTABLE CHEST 1 VIEW COMPARISON:  09/22/2021 FINDINGS: The heart size and mediastinal contours are within normal limits. Both lungs are clear. The visualized skeletal structures are unremarkable. IMPRESSION: No active disease. Electronically Signed   By: Inez Catalina M.D.   On: 11/18/2021 21:41     Time coordinating discharge: Over 30 minutes    Dwyane Dee, MD  [facility] 11/21/2021, 3:02 PM

## 2021-12-04 ENCOUNTER — Inpatient Hospital Stay (HOSPITAL_COMMUNITY)
Admission: EM | Admit: 2021-12-04 | Discharge: 2021-12-11 | DRG: 638 | Disposition: A | Discharge status: Home / Self Care | Payer: Medicare Other | Attending: Family Medicine | Admitting: Family Medicine

## 2021-12-04 ENCOUNTER — Encounter (HOSPITAL_COMMUNITY): Payer: Self-pay

## 2021-12-04 ENCOUNTER — Other Ambulatory Visit: Payer: Self-pay

## 2021-12-04 ENCOUNTER — Emergency Department (HOSPITAL_COMMUNITY): Payer: Medicare Other

## 2021-12-04 DIAGNOSIS — K3184 Gastroparesis: Secondary | ICD-10-CM | POA: Diagnosis present

## 2021-12-04 DIAGNOSIS — E669 Obesity, unspecified: Secondary | ICD-10-CM | POA: Diagnosis present

## 2021-12-04 DIAGNOSIS — E1065 Type 1 diabetes mellitus with hyperglycemia: Secondary | ICD-10-CM | POA: Diagnosis present

## 2021-12-04 DIAGNOSIS — K529 Noninfective gastroenteritis and colitis, unspecified: Secondary | ICD-10-CM

## 2021-12-04 DIAGNOSIS — E876 Hypokalemia: Secondary | ICD-10-CM | POA: Diagnosis present

## 2021-12-04 DIAGNOSIS — I10 Essential (primary) hypertension: Secondary | ICD-10-CM | POA: Diagnosis present

## 2021-12-04 DIAGNOSIS — E1040 Type 1 diabetes mellitus with diabetic neuropathy, unspecified: Secondary | ICD-10-CM | POA: Diagnosis present

## 2021-12-04 DIAGNOSIS — Z794 Long term (current) use of insulin: Secondary | ICD-10-CM

## 2021-12-04 DIAGNOSIS — E1043 Type 1 diabetes mellitus with diabetic autonomic (poly)neuropathy: Secondary | ICD-10-CM | POA: Diagnosis present

## 2021-12-04 DIAGNOSIS — E872 Acidosis, unspecified: Secondary | ICD-10-CM

## 2021-12-04 DIAGNOSIS — Z87891 Personal history of nicotine dependence: Secondary | ICD-10-CM

## 2021-12-04 DIAGNOSIS — K219 Gastro-esophageal reflux disease without esophagitis: Secondary | ICD-10-CM | POA: Diagnosis present

## 2021-12-04 DIAGNOSIS — R112 Nausea with vomiting, unspecified: Principal | ICD-10-CM | POA: Diagnosis present

## 2021-12-04 DIAGNOSIS — Z6831 Body mass index (BMI) 31.0-31.9, adult: Secondary | ICD-10-CM

## 2021-12-04 DIAGNOSIS — R339 Retention of urine, unspecified: Secondary | ICD-10-CM | POA: Diagnosis present

## 2021-12-04 DIAGNOSIS — I959 Hypotension, unspecified: Secondary | ICD-10-CM | POA: Diagnosis present

## 2021-12-04 DIAGNOSIS — E1010 Type 1 diabetes mellitus with ketoacidosis without coma: Principal | ICD-10-CM | POA: Diagnosis present

## 2021-12-04 DIAGNOSIS — D539 Nutritional anemia, unspecified: Secondary | ICD-10-CM

## 2021-12-04 DIAGNOSIS — E1343 Other specified diabetes mellitus with diabetic autonomic (poly)neuropathy: Secondary | ICD-10-CM | POA: Diagnosis present

## 2021-12-04 DIAGNOSIS — G894 Chronic pain syndrome: Secondary | ICD-10-CM

## 2021-12-04 DIAGNOSIS — M797 Fibromyalgia: Secondary | ICD-10-CM | POA: Diagnosis present

## 2021-12-04 DIAGNOSIS — E1110 Type 2 diabetes mellitus with ketoacidosis without coma: Secondary | ICD-10-CM | POA: Diagnosis present

## 2021-12-04 DIAGNOSIS — N179 Acute kidney failure, unspecified: Secondary | ICD-10-CM | POA: Diagnosis present

## 2021-12-04 DIAGNOSIS — Z79899 Other long term (current) drug therapy: Secondary | ICD-10-CM

## 2021-12-04 DIAGNOSIS — Z8249 Family history of ischemic heart disease and other diseases of the circulatory system: Secondary | ICD-10-CM

## 2021-12-04 DIAGNOSIS — Z7951 Long term (current) use of inhaled steroids: Secondary | ICD-10-CM

## 2021-12-04 DIAGNOSIS — E109 Type 1 diabetes mellitus without complications: Secondary | ICD-10-CM | POA: Diagnosis present

## 2021-12-04 LAB — COMPREHENSIVE METABOLIC PANEL
ALT: 38 U/L (ref 0–44)
AST: 58 U/L — ABNORMAL HIGH (ref 15–41)
Albumin: 3.1 g/dL — ABNORMAL LOW (ref 3.5–5.0)
Alkaline Phosphatase: 176 U/L — ABNORMAL HIGH (ref 38–126)
Anion gap: 19 — ABNORMAL HIGH (ref 5–15)
BUN: 5 mg/dL — ABNORMAL LOW (ref 6–20)
CO2: 14 mmol/L — ABNORMAL LOW (ref 22–32)
Calcium: 9 mg/dL (ref 8.9–10.3)
Chloride: 104 mmol/L (ref 98–111)
Creatinine, Ser: 1.26 mg/dL — ABNORMAL HIGH (ref 0.44–1.00)
GFR, Estimated: 55 mL/min — ABNORMAL LOW (ref >60)
Glucose, Bld: 185 mg/dL — ABNORMAL HIGH (ref 70–99)
Potassium: 2.6 mmol/L — CL (ref 3.5–5.1)
Sodium: 137 mmol/L (ref 135–145)
Total Bilirubin: 2.6 mg/dL — ABNORMAL HIGH (ref 0.3–1.2)
Total Protein: 6.8 g/dL (ref 6.5–8.1)

## 2021-12-04 LAB — BLOOD GAS, VENOUS
Acid-base deficit: 6.6 mmol/L — ABNORMAL HIGH (ref 0.0–2.0)
Bicarbonate: 16.9 mmol/L — ABNORMAL LOW (ref 20.0–28.0)
O2 Saturation: 52.8 %
Patient temperature: 37
pCO2, Ven: 28 mmHg — ABNORMAL LOW (ref 44–60)
pH, Ven: 7.39 (ref 7.25–7.43)
pO2, Ven: 38 mmHg (ref 32–45)

## 2021-12-04 LAB — CBG MONITORING, ED
Glucose-Capillary: 207 mg/dL — ABNORMAL HIGH (ref 70–99)
Glucose-Capillary: 184 mg/dL — ABNORMAL HIGH (ref 70–99)
Glucose-Capillary: 267 mg/dL — ABNORMAL HIGH (ref 70–99)

## 2021-12-04 LAB — CBC
HCT: 33.5 % — ABNORMAL LOW (ref 36.0–46.0)
Hemoglobin: 11.2 g/dL — ABNORMAL LOW (ref 12.0–15.0)
MCH: 36.1 pg — ABNORMAL HIGH (ref 26.0–34.0)
MCHC: 33.4 g/dL (ref 30.0–36.0)
MCV: 108.1 fL — ABNORMAL HIGH (ref 80.0–100.0)
Platelets: 150 10*3/uL (ref 150–400)
RBC: 3.1 MIL/uL — ABNORMAL LOW (ref 3.87–5.11)
RDW: 19.6 % — ABNORMAL HIGH (ref 11.5–15.5)
WBC: 7.2 10*3/uL (ref 4.0–10.5)
nRBC: 0 % (ref 0.0–0.2)

## 2021-12-04 LAB — LIPASE, BLOOD: Lipase: 24 U/L (ref 11–51)

## 2021-12-04 LAB — HCG, SERUM, QUALITATIVE: Preg, Serum: NEGATIVE

## 2021-12-04 LAB — BETA-HYDROXYBUTYRIC ACID: Beta-Hydroxybutyric Acid: 6.29 mmol/L — ABNORMAL HIGH (ref 0.05–0.27)

## 2021-12-04 LAB — GLUCOSE, CAPILLARY
Glucose-Capillary: 359 mg/dL — ABNORMAL HIGH (ref 70–99)
Glucose-Capillary: 333 mg/dL — ABNORMAL HIGH (ref 70–99)

## 2021-12-04 MED ORDER — LORAZEPAM 2 MG/ML IJ SOLN
0.5000 mg | Freq: Once | INTRAMUSCULAR | Status: AC
  Administered 2021-12-05: 0.5 mg via INTRAVENOUS

## 2021-12-04 MED ORDER — INSULIN GLARGINE-YFGN 100 UNIT/ML ~~LOC~~ SOLN
22.0000 [IU] | Freq: Every day | SUBCUTANEOUS | Status: DC
  Administered 2021-12-04: 22 [IU] via SUBCUTANEOUS
  Filled 2021-12-04 (×2): qty 0.22

## 2021-12-04 MED ORDER — METOCLOPRAMIDE HCL 5 MG/ML IJ SOLN
10.0000 mg | Freq: Once | INTRAMUSCULAR | Status: AC
  Administered 2021-12-04: 10 mg via INTRAVENOUS
  Filled 2021-12-04: qty 2

## 2021-12-04 MED ORDER — ONDANSETRON 4 MG PO TBDP
8.0000 mg | ORAL_TABLET | Freq: Three times a day (TID) | ORAL | Status: DC | PRN
  Administered 2021-12-04 – 2021-12-10 (×4): 8 mg via ORAL
  Filled 2021-12-04 (×4): qty 2

## 2021-12-04 MED ORDER — SODIUM CHLORIDE 0.9 % IV BOLUS
1000.0000 mL | Freq: Once | INTRAVENOUS | Status: AC
  Administered 2021-12-04: 1000 mL via INTRAVENOUS

## 2021-12-04 MED ORDER — METOCLOPRAMIDE HCL 5 MG/ML IJ SOLN
10.0000 mg | Freq: Three times a day (TID) | INTRAMUSCULAR | Status: DC
  Administered 2021-12-04 – 2021-12-11 (×21): 10 mg via INTRAVENOUS
  Filled 2021-12-04 (×22): qty 2

## 2021-12-04 MED ORDER — LOPERAMIDE HCL 2 MG PO CAPS: 2.0000 mg | ORAL_CAPSULE | ORAL | Status: DC | PRN

## 2021-12-04 MED ORDER — ROSUVASTATIN CALCIUM 20 MG PO TABS
20.0000 mg | ORAL_TABLET | Freq: Every day | ORAL | Status: DC
  Administered 2021-12-04 – 2021-12-11 (×8): 20 mg via ORAL
  Filled 2021-12-04 (×8): qty 1

## 2021-12-04 MED ORDER — SODIUM CHLORIDE 0.9 % IV BOLUS
1000.0000 mL | Freq: Once | INTRAVENOUS | Status: AC
Start: 2021-12-04 — End: 2021-12-05
  Administered 2021-12-04: 1000 mL via INTRAVENOUS

## 2021-12-04 MED ORDER — INSULIN GLULISINE 100 UNIT/ML SOLOSTAR PEN: 1.0000 [IU] | PEN_INJECTOR | SUBCUTANEOUS | Status: DC

## 2021-12-04 MED ORDER — ONDANSETRON 8 MG PO TBDP
8.0000 mg | ORAL_TABLET | Freq: Once | ORAL | Status: AC
  Administered 2021-12-04: 8 mg via ORAL
  Filled 2021-12-04: qty 1

## 2021-12-04 MED ORDER — DRONABINOL 2.5 MG PO CAPS
2.5000 mg | ORAL_CAPSULE | Freq: Two times a day (BID) | ORAL | Status: DC
  Administered 2021-12-06 – 2021-12-11 (×11): 2.5 mg via ORAL
  Filled 2021-12-04 (×11): qty 1

## 2021-12-04 MED ORDER — ENOXAPARIN SODIUM 40 MG/0.4ML IJ SOSY
40.0000 mg | PREFILLED_SYRINGE | INTRAMUSCULAR | Status: DC
  Administered 2021-12-04 – 2021-12-10 (×7): 40 mg via SUBCUTANEOUS
  Filled 2021-12-04 (×7): qty 0.4

## 2021-12-04 MED ORDER — MOMETASONE FURO-FORMOTEROL FUM 100-5 MCG/ACT IN AERO
2.0000 | INHALATION_SPRAY | Freq: Two times a day (BID) | RESPIRATORY_TRACT | Status: DC
  Administered 2021-12-04 – 2021-12-11 (×13): 2 via RESPIRATORY_TRACT
  Filled 2021-12-04: qty 8.8

## 2021-12-04 MED ORDER — GABAPENTIN 300 MG PO CAPS
300.0000 mg | ORAL_CAPSULE | Freq: Three times a day (TID) | ORAL | Status: DC
  Administered 2021-12-04 – 2021-12-05 (×2): 300 mg via ORAL
  Filled 2021-12-04 (×2): qty 1

## 2021-12-04 MED ORDER — INSULIN ASPART 100 UNIT/ML IJ SOLN
0.0000 [IU] | Freq: Three times a day (TID) | INTRAMUSCULAR | Status: DC
  Filled 2021-12-04: qty 0.15

## 2021-12-04 MED ORDER — PANTOPRAZOLE SODIUM 40 MG PO TBEC
40.0000 mg | DELAYED_RELEASE_TABLET | Freq: Every day | ORAL | Status: DC
  Administered 2021-12-04: 40 mg via ORAL
  Filled 2021-12-04: qty 1

## 2021-12-04 MED ORDER — MONTELUKAST SODIUM 10 MG PO TABS
10.0000 mg | ORAL_TABLET | Freq: Every day | ORAL | Status: DC
  Administered 2021-12-04 – 2021-12-10 (×7): 10 mg via ORAL
  Filled 2021-12-04 (×8): qty 1

## 2021-12-04 MED ORDER — PRUCALOPRIDE SUCCINATE 1 MG PO TABS: 1.0000 mg | ORAL_TABLET | Freq: Every day | ORAL | Status: DC

## 2021-12-04 MED ORDER — ACETAMINOPHEN 650 MG RE SUPP: 650.0000 mg | Freq: Four times a day (QID) | RECTAL | Status: DC | PRN

## 2021-12-04 MED ORDER — PANTOPRAZOLE SODIUM 40 MG IV SOLR
40.0000 mg | Freq: Once | INTRAVENOUS | Status: AC
Start: 2021-12-04 — End: 2021-12-04
  Administered 2021-12-04: 40 mg via INTRAVENOUS
  Filled 2021-12-04: qty 10

## 2021-12-04 MED ORDER — POTASSIUM CHLORIDE 10 MEQ/100ML IV SOLN
10.0000 meq | INTRAVENOUS | Status: AC
  Administered 2021-12-04 (×3): 10 meq via INTRAVENOUS
  Filled 2021-12-04 (×3): qty 100

## 2021-12-04 MED ORDER — ACETAMINOPHEN 325 MG PO TABS
650.0000 mg | ORAL_TABLET | Freq: Four times a day (QID) | ORAL | Status: DC | PRN
  Administered 2021-12-06 – 2021-12-11 (×9): 650 mg via ORAL
  Filled 2021-12-04 (×10): qty 2

## 2021-12-04 MED ORDER — FAMOTIDINE 20 MG PO TABS
40.0000 mg | ORAL_TABLET | Freq: Two times a day (BID) | ORAL | Status: DC
  Administered 2021-12-04: 40 mg via ORAL
  Filled 2021-12-04 (×2): qty 2

## 2021-12-04 MED ORDER — SODIUM CHLORIDE 0.45 % IV SOLN: INTRAVENOUS | Status: DC

## 2021-12-04 NOTE — H&P (Addendum)
History and Physical    Anna Garcia EPP:[id number] DOB: [birth year] DOA: 12/04/2021  DOS: the patient was seen and examined on 12/04/2021  PCP: Day, [name], MD   Patient coming from: Home  I have personally briefly reviewed patient's old medical records in [place]  Anna Garcia, Garcia 42 y/o with Garcia long h/o DM1 with gastroparesis and frequent issues with refractory N?V and metabolic derangement. Last admission 7/30-11/20/21 for refractory N/V treated with antiemetics and IV fluids. She is followed in [place]. She has tried Garcia variety of treatments. Last recommendation was for drabinol but this was not approved by her insurance company. She now presents for recurrent/persistent N/V to [facility]. She has had multiple admissions for N/V, for DKA and other medical problems.    ED Course: afebrile, VSS, VBG pH 7.3 K 2.6, Cr 1.26, KUB with small bowel wall thickening left side. In ED patient received 10 meq K x 3, Reglan and zofran. TRH called to admit for continued mgt.   Review of Systems:  Review of Systems  Constitutional:  Negative for chills and weight loss.  HENT: Negative.    Eyes: Negative.   Respiratory: Negative.    Cardiovascular: Negative.   Gastrointestinal:  Positive for abdominal pain, nausea and vomiting.  Genitourinary: Negative.   Musculoskeletal: Negative.   Skin: Negative.   Neurological: Negative.   Endo/Heme/Allergies: Negative.   Psychiatric/Behavioral: Negative.      Past Medical History:  Diagnosis Date   Diabetes mellitus    Gastroparesis    Hypertension    Neuropathy     Past Surgical History:  Procedure Laterality Date   ESOPHAGOGASTRODUODENOSCOPY (EGD) WITH PROPOFOL N/Garcia 09/24/2021   Procedure: ESOPHAGOGASTRODUODENOSCOPY (EGD) WITH PROPOFOL;  Surgeon: Anna Essex, MD;  Location: [facility];  Service: Gastroenterology;  Laterality: N/Garcia;   SAVORY DILATION N/Garcia 09/24/2021   Procedure: SAVORY DILATION - 16 MM;  Surgeon: Anna Essex, MD;  Location: [facility];   Service: Gastroenterology;  Laterality: N/Garcia;   Soc Hx - maiden. Lives alone. Does not work - on disability for multiple co-morbidities.    reports that she has quit smoking. Her smoking use included cigarettes. She smoked an average of .5 packs per day. She has never used smokeless tobacco. She reports that she does not drink alcohol and does not use drugs.  No Known Allergies  Family History  Problem Relation Age of Onset   Hypertension Mother    Dementia Mother    Heart attack Father    Hypertension Father    Heart disease Father    Hypertension Brother    Stroke Maternal Grandfather     Prior to Admission medications   Medication Sig Start Date End Date Taking? Authorizing Provider  BAQSIMI TWO PACK 3 MG/DOSE POWD Place 1 spray into both nostrils as needed (for Garcia very low blood sugar emergency, when unable to eat or drink and need help from someone else- or as otherwise directed). 10/30/21  Yes [provider]  DEXILANT 60 MG capsule Take 1 capsule (60 mg total) by mouth daily. 09/26/21  Yes Regalado, Belkys A, MD  famotidine (PEPCID) 40 MG tablet Take 40 mg by mouth 2 (two) times daily. 05/28/19  Yes [provider]  gabapentin (NEURONTIN) 300 MG capsule Take 1 capsule (300 mg total) by mouth 3 (three) times daily. 05/28/21  Yes Anna Filler, MD  granisetron (KYTRIL) 1 MG tablet Take 1 mg by mouth every 12 (twelve) hours. 11/05/21  Yes [provider]  insulin glargine (LANTUS SOLOSTAR) 100 UNIT/ML Solostar Pen Inject 22 Units into the skin daily. Patient taking differently: Inject 7 Units into the skin at bedtime. 09/26/21  Yes Regalado, Belkys A, MD  insulin glulisine (APIDRA SOLOSTAR) 100 UNIT/ML Solostar Pen Inject 0-8 Units into the skin See admin instructions. Inject 0-8 units into the skin daily AS NEEDED if blood glucose levels are trending upwards- changes as per carb intake 07/13/20  Yes [provider]  loperamide (IMODIUM) 2 MG capsule Take  1 capsule (2 mg total) by mouth as needed for diarrhea or loose stools. 09/26/21  Yes Regalado, Belkys A, MD  losartan (COZAAR) 25 MG tablet Take 25 mg by mouth daily.   Yes [provider]  mometasone-formoterol (DULERA) 100-5 MCG/ACT AERO Inhale 2 puffs into the lungs 2 (two) times daily. 07/07/20  Yes Anna Riding, MD  montelukast (SINGULAIR) 10 MG tablet Take 10 mg by mouth at bedtime. 05/24/19  Yes [provider]  MOTEGRITY 1 MG TABS Take 1 mg by mouth daily. 08/31/21  Yes [provider]  rosuvastatin (CRESTOR) 20 MG tablet Take 20 mg by mouth at bedtime. 05/24/19  Yes [provider]  sulfamethoxazole-trimethoprim (BACTRIM DS) 800-160 MG tablet Take 1 tablet by mouth 2 (two) times daily. 11/30/21 12/06/21 Yes [provider]  Vitamin D, Ergocalciferol, (DRISDOL) 1.25 MG (50000 UNIT) CAPS capsule Take 1 capsule (50,000 Units total) by mouth every 7 (seven) days. Patient not taking: Reported on 12/04/2021 10/02/21   Regalado, Jerald Kief A, MD  insulin aspart (NOVOLOG) 100 UNIT/ML injection Inject 3 units three times Garcia day with meals if you eat over 50% of your meals. In addition, use sliding scale as follows: CBG 70 - 120: 0 units CBG 121 - 150: 2 units CBG 151 - 200: 3 units CBG 201 - 250: 5 units CBG 251 - 300: 8 units CBG 301 - 350: 11 units CBG 351 - 400: 15 units CBG > 400: call MD and obtain STAT lab verification Patient not taking: No sig reported 07/07/20 10/13/20  Anna Riding, MD    Physical Exam: Vitals:   12/04/21 1411 12/04/21 1430 12/04/21 1530 12/04/21 1630  BP:  101/67 112/74 99/62  Pulse:  89 100 94  Resp:  '18 18 16  '$ Temp: 97.8 F (36.6 C)     TempSrc: Oral     SpO2:  100% 100% 100%    Physical Exam Vitals and nursing note reviewed.  Constitutional:      General: She is not in acute distress.    Appearance: She is well-developed and normal weight. She is not ill-appearing or toxic-appearing.  HENT:     Head: Normocephalic and  atraumatic.     Mouth/Throat:     Mouth: Mucous membranes are moist.  Eyes:     Extraocular Movements: Extraocular movements intact.     Pupils: Pupils are equal, round, and reactive to light.  Cardiovascular:     Rate and Rhythm: Normal rate and regular rhythm.  Pulmonary:     Effort: Pulmonary effort is normal.     Breath sounds: Normal breath sounds.  Abdominal:     General: Abdomen is protuberant. Bowel sounds are decreased. There is distension.     Palpations: Abdomen is soft. There is no hepatomegaly.     Tenderness: There is generalized abdominal tenderness. There is no guarding or rebound.  Skin:    General: Skin is warm and dry.  Neurological:     General: No focal  deficit present.     Mental Status: She is alert and oriented to person, place, and time.     Cranial Nerves: No cranial nerve deficit.  Psychiatric:        Mood and Affect: Mood normal.        Behavior: Behavior normal.      Labs on Admission: I have personally reviewed following labs and imaging studies  CBC: Recent Labs  Lab 12/04/21 1144  WBC 7.2  HGB 11.2*  HCT 33.5*  MCV 108.1*  PLT 283   Basic Metabolic Panel: Recent Labs  Lab 12/04/21 1144  NA 137  K 2.6*  CL 104  CO2 14*  GLUCOSE 185*  BUN 5*  CREATININE 1.26*  CALCIUM 9.0   GFR: CrCl cannot be calculated (Unknown ideal weight.). Liver Function Tests: Recent Labs  Lab 12/04/21 1144  AST 58*  ALT 38  ALKPHOS 176*  BILITOT 2.6*  PROT 6.8  ALBUMIN 3.1*   Recent Labs  Lab 12/04/21 1144  LIPASE 24   No results for input(s): "AMMONIA" in the last 168 hours. Coagulation Profile: No results for input(s): "INR", "PROTIME" in the last 168 hours. Cardiac Enzymes: No results for input(s): "CKTOTAL", "CKMB", "CKMBINDEX", "TROPONINI" in the last 168 hours. BNP (last 3 results) No results for input(s): "PROBNP" in the last 8760 hours. HbA1C: No results for input(s): "HGBA1C" in the last 72 hours. CBG: Recent Labs  Lab  12/04/21 0943 12/04/21 1053 12/04/21 1550  GLUCAP 207* 184* 267*   Lipid Profile: No results for input(s): "CHOL", "HDL", "LDLCALC", "TRIG", "CHOLHDL", "LDLDIRECT" in the last 72 hours. Thyroid Function Tests: No results for input(s): "TSH", "T4TOTAL", "FREET4", "T3FREE", "THYROIDAB" in the last 72 hours. Anemia Panel: No results for input(s): "VITAMINB12", "FOLATE", "FERRITIN", "TIBC", "IRON", "RETICCTPCT" in the last 72 hours. Urine analysis:    Component Value Date/Time   COLORURINE YELLOW 11/19/2021 0138   APPEARANCEUR HAZY (Garcia) 11/19/2021 0138   LABSPEC 1.001 (L) 11/19/2021 0138   PHURINE 7.0 11/19/2021 0138   GLUCOSEU >=500 (Garcia) 11/19/2021 0138   HGBUR MODERATE (Garcia) 11/19/2021 0138   BILIRUBINUR NEGATIVE 11/19/2021 0138   KETONESUR 5 (Garcia) 11/19/2021 0138   PROTEINUR 30 (Garcia) 11/19/2021 0138   UROBILINOGEN 0.2 04/11/2011 1437   NITRITE NEGATIVE 11/19/2021 0138   LEUKOCYTESUR NEGATIVE 11/19/2021 0138    Radiological Exams on Admission: I have personally reviewed images DG Abdomen Acute W/Chest  Result Date: 12/04/2021 CLINICAL DATA:  Nausea and vomiting with abdominal pain. EXAM: DG ABDOMEN ACUTE WITH 1 VIEW CHEST COMPARISON:  Chest x-ray dated November 18, 2021. FINDINGS: There is no evidence of dilated bowel loops or free intraperitoneal air. Questionable small bowel wall thickening in the left abdomen. No radiopaque calculi or other significant radiographic abnormality is seen. IUD in the pelvis. Heart size and mediastinal contours are within normal limits. Minimal linear atelectasis at the lung bases. No focal consolidation, pleural effusion, or pneumothorax. No acute osseous abnormality. IMPRESSION: 1. Questionable small bowel wall thickening in the left abdomen, which could reflect enteritis. No obstruction. 2. Minimal bibasilar atelectasis. No acute cardiopulmonary disease. Electronically Signed   By: Titus Dubin M.D.   On: 12/04/2021 13:49    EKG: I have personally reviewed  EKG: no new EKG  Assessment/Plan Principal Problem:   Refractory nausea and vomiting Active Problems:   Gastroparesis   Hypokalemia   Uncontrolled type 1 diabetes mellitus with hyperglycemia, with long-term current use of insulin (HCC)   GERD (gastroesophageal reflux disease)    Assessment  and Plan: Hypokalemia Recurrent problem associated with refractory N/V. EDP ordered 10 meq runs x 3 completed in ED  Plan F/u K level in AM  Gastroparesis Long standing problem. Last gastric empty study in chart is from 2002 and was nl. She has had multiple evaluations but doesn't recall completing another study. In June '23 she did have EGD with Dr. Watt Climes. She has followed with GI at [facility] - but is willing to change to [facility] teams. She has tried Garcia variety of medications with mixed results  Plan IV hydration  Zofran 8 mg TID prn  Reglan 10 mg tabs AC/HS  Gastric emptying study  GI consult - [facility] unassigned - inpatient team to call in AM  Uncontrolled type 1 diabetes mellitus with hyperglycemia, with long-term current use of insulin (HCC) Last A1C 10/22/21 7.6%. Patient uses basal insulin qHS and apidra.  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 She feels this has interfered with swallow and N/V  Plan PPI  GI consult - [facility]       DVT prophylaxis: Lovenox Code Status: Full Code Family Communication: patient has informed her contacts of admission, chooses that call not be made  Disposition Plan: home when stable  Consults called: GI - [facility], routine. Secure chat to Dr. Watt Climes  Admission status: Inpatient, Med-Surg   Adella Hare, MD [facility] 12/04/2021, 5:57 PM

## 2021-12-04 NOTE — ED Triage Notes (Signed)
Pt reports N/V and abdominal pain and bloating since she was seen here on 7/30. Hx of type 1 diabetes and gastroparesis. Pt reports giving herself insulin this morning due to CBG of over 300.

## 2021-12-04 NOTE — ED Provider Triage Note (Signed)
Emergency Medicine Provider Triage Evaluation Note  KIMBERLYE DILGER , a 41 y.o. female  was evaluated in triage.  Pt complains of nausea and vomiting. Hx type 1 diabetes, gave self insulin at home this am for cbg over 300.  Review of Systems  Positive: N/v, bloating, elevated glucose  Negative: fever  Physical Exam  There were no vitals taken for this visit. Gen:   Awake, no distress   Resp:  Normal effort  MSK:   Moves extremities without difficulty  Other:  Actively vomiting   Medical Decision Making  Medically screening exam initiated at 9:49 AM.  Appropriate orders placed.  Shauna Hugh was informed that the remainder of the evaluation will be completed by another provider, this initial triage assessment does not replace that evaluation, and the importance of remaining in the ED until their evaluation is complete.     Tacy Learn, PA-C 12/04/21 [phone]

## 2021-12-04 NOTE — Assessment & Plan Note (Addendum)
Last A1C 10/22/21 7.6%. Patient uses basal insulin qHS and apidra.  Plan Will continue home regimen: basal insulin 22 u qHS   Will use SS, not apidra for coverage

## 2021-12-04 NOTE — ED Notes (Signed)
Unable to give urine sample at this time.  

## 2021-12-04 NOTE — ED Provider Notes (Addendum)
[facility] DEPT Provider Note   CSN: [id number] Arrival date & time: 12/04/21  0932     History  Chief Complaint  Patient presents with   Emesis   Abdominal Pain    Anna Garcia is a 41 y.o. female.   Emesis Associated symptoms: abdominal pain   Abdominal Pain Associated symptoms: vomiting      Patient has history of diabetes, hypertension, neuropathy and gastroparesis.  Patient presents to the ED with complaints of not being able to eat for the last couple of months.  She has been having recurrent issues with nausea and vomiting.  Patient states she is feeling bloated.  She gags whenever she tries to eat something and she feels like something is in the back of her throat.  She had some abdominal discomfort but no diarrhea no dysuria.  Patient has been seen in the emergency room here on June 3 for diabetic ketoacidosis.  She was also admitted to the hospital on July life 30th for hypokalemia stated with intractable nausea and vomiting.  Patient sees a GI doctor in [place].  She had an EGD on June 5.  Prior records reviewed.  Patient has been in contact with her GI doctors last month.  Patient has failed Dantron and promethazine.  They were trying to have her start dronabinol  Home Medications Prior to Admission medications   Medication Sig Start Date End Date Taking? Authorizing Provider  BAQSIMI TWO PACK 3 MG/DOSE POWD Place 1 spray into both nostrils as needed. 10/30/21   [provider]  DEXILANT 60 MG capsule Take 1 capsule (60 mg total) by mouth daily. 09/26/21   Regalado, Belkys A, MD  famotidine (PEPCID) 40 MG tablet Take 40 mg by mouth 2 (two) times daily. 05/28/19   [provider]  gabapentin (NEURONTIN) 300 MG capsule Take 1 capsule (300 mg total) by mouth 3 (three) times daily. 05/28/21   Eugenie Filler, MD  granisetron (KYTRIL) 1 MG tablet Take 1 mg by mouth every 12 (twelve) hours. 11/05/21   [provider]   insulin glargine (LANTUS SOLOSTAR) 100 UNIT/ML Solostar Pen Inject 22 Units into the skin daily. Patient taking differently: Inject 10 Units into the skin at bedtime. 09/26/21   Regalado, Belkys A, MD  insulin glulisine (APIDRA SOLOSTAR) 100 UNIT/ML Solostar Pen Inject 1-15 Units into the skin See admin instructions. Changes as per carb intake as needed high CBG 07/13/20   [provider]  loperamide (IMODIUM) 2 MG capsule Take 1 capsule (2 mg total) by mouth as needed for diarrhea or loose stools. 09/26/21   Regalado, Belkys A, MD  mometasone-formoterol (DULERA) 100-5 MCG/ACT AERO Inhale 2 puffs into the lungs 2 (two) times daily. 07/07/20   Mercy Riding, MD  montelukast (SINGULAIR) 10 MG tablet Take 10 mg by mouth at bedtime. 05/24/19   [provider]  MOTEGRITY 1 MG TABS Take 1 mg by mouth daily. 08/31/21   [provider]  rosuvastatin (CRESTOR) 20 MG tablet Take 20 mg by mouth daily. 05/24/19   [provider]  Vitamin D, Ergocalciferol, (DRISDOL) 1.25 MG (50000 UNIT) CAPS capsule Take 1 capsule (50,000 Units total) by mouth every 7 (seven) days. Patient not taking: Reported on 11/18/2021 10/02/21   Regalado, Jerald Kief A, MD  insulin aspart (NOVOLOG) 100 UNIT/ML injection Inject 3 units three times a day with meals if you eat over 50% of your meals. In addition, use sliding scale as follows: CBG 70 -  120: 0 units CBG 121 - 150: 2 units CBG 151 - 200: 3 units CBG 201 - 250: 5 units CBG 251 - 300: 8 units CBG 301 - 350: 11 units CBG 351 - 400: 15 units CBG > 400: call MD and obtain STAT lab verification Patient not taking: No sig reported 07/07/20 10/13/20  Mercy Riding, MD      Allergies    Patient has no known allergies.    Review of Systems   Review of Systems  Gastrointestinal:  Positive for abdominal pain and vomiting.    Physical Exam Updated Vital Signs BP 101/67   Pulse 89   Temp 97.8 F (36.6 C) (Oral)   Resp 18   SpO2 100%  Physical Exam Vitals and  nursing note reviewed.  Constitutional:      Appearance: She is ill-appearing.  HENT:     Head: Normocephalic and atraumatic.     Right Ear: External ear normal.     Left Ear: External ear normal.  Eyes:     General: No scleral icterus.       Right eye: No discharge.        Left eye: No discharge.     Conjunctiva/sclera: Conjunctivae normal.  Neck:     Trachea: No tracheal deviation.  Cardiovascular:     Rate and Rhythm: Normal rate and regular rhythm.  Pulmonary:     Effort: Pulmonary effort is normal. No respiratory distress.     Breath sounds: Normal breath sounds. No stridor. No wheezing or rales.  Abdominal:     General: Bowel sounds are normal. There is no distension.     Palpations: Abdomen is soft.     Tenderness: There is no abdominal tenderness. There is no guarding or rebound.     Comments: Patient started retching after examining her oropharynx, brought up care clear mucus and liquid  Musculoskeletal:        General: No tenderness or deformity.     Cervical back: Neck supple.  Skin:    General: Skin is warm and dry.     Coloration: Skin is pale.     Findings: No rash.  Neurological:     General: No focal deficit present.     Mental Status: She is alert.     Cranial Nerves: No cranial nerve deficit (no facial droop, extraocular movements intact, no slurred speech).     Sensory: No sensory deficit.     Motor: No abnormal muscle tone or seizure activity.     Coordination: Coordination normal.  Psychiatric:        Mood and Affect: Mood normal.     ED Results / Procedures / Treatments   Labs (all labs ordered are listed, but only abnormal results are displayed) Labs Reviewed  COMPREHENSIVE METABOLIC PANEL - Abnormal; Notable for the following components:      Result Value   Potassium 2.6 (*)    CO2 14 (*)    Glucose, Bld 185 (*)    BUN 5 (*)    Creatinine, Ser 1.26 (*)    Albumin 3.1 (*)    AST 58 (*)    Alkaline Phosphatase 176 (*)    Total Bilirubin  2.6 (*)    GFR, Estimated 55 (*)    Anion gap 19 (*)    All other components within normal limits  CBC - Abnormal; Notable for the following components:   RBC 3.10 (*)    Hemoglobin 11.2 (*)    HCT  33.5 (*)    MCV 108.1 (*)    MCH 36.1 (*)    RDW 19.6 (*)    All other components within normal limits  BLOOD GAS, VENOUS - Abnormal; Notable for the following components:   pCO2, Ven 28 (*)    Bicarbonate 16.9 (*)    Acid-base deficit 6.6 (*)    All other components within normal limits  CBG MONITORING, ED - Abnormal; Notable for the following components:   Glucose-Capillary 207 (*)    All other components within normal limits  CBG MONITORING, ED - Abnormal; Notable for the following components:   Glucose-Capillary 184 (*)    All other components within normal limits  LIPASE, BLOOD  HCG, SERUM, QUALITATIVE  URINALYSIS, ROUTINE W REFLEX MICROSCOPIC  BETA-HYDROXYBUTYRIC ACID    EKG None  Radiology DG Abdomen Acute W/Chest  Result Date: 12/04/2021 CLINICAL DATA:  Nausea and vomiting with abdominal pain. EXAM: DG ABDOMEN ACUTE WITH 1 VIEW CHEST COMPARISON:  Chest x-ray dated November 18, 2021. FINDINGS: There is no evidence of dilated bowel loops or free intraperitoneal air. Questionable small bowel wall thickening in the left abdomen. No radiopaque calculi or other significant radiographic abnormality is seen. IUD in the pelvis. Heart size and mediastinal contours are within normal limits. Minimal linear atelectasis at the lung bases. No focal consolidation, pleural effusion, or pneumothorax. No acute osseous abnormality. IMPRESSION: 1. Questionable small bowel wall thickening in the left abdomen, which could reflect enteritis. No obstruction. 2. Minimal bibasilar atelectasis. No acute cardiopulmonary disease. Electronically Signed   By: Titus Dubin M.D.   On: 12/04/2021 13:49    Procedures .Critical Care  Performed by: Dorie Rank, MD Authorized by: Dorie Rank, MD   Critical care  provider statement:    Critical care time (minutes):  30   Critical care was time spent personally by me on the following activities:  Development of treatment plan with patient or surrogate, discussions with consultants, evaluation of patient's response to treatment, examination of patient, ordering and review of laboratory studies, ordering and review of radiographic studies, ordering and performing treatments and interventions, pulse oximetry, re-evaluation of patient's condition and review of old charts     Medications Ordered in ED Medications  potassium chloride 10 mEq in 100 mL IVPB (has no administration in time range)  sodium chloride 0.9 % bolus 1,000 mL (has no administration in time range)  sodium chloride 0.9 % bolus 1,000 mL (1,000 mLs Intravenous New Bag/Given 12/04/21 1156)  ondansetron (ZOFRAN-ODT) disintegrating tablet 8 mg (8 mg Oral Given 12/04/21 1037)  metoCLOPramide (REGLAN) injection 10 mg (10 mg Intravenous Given 12/04/21 1244)  pantoprazole (PROTONIX) injection 40 mg (40 mg Intravenous Given 12/04/21 1242)  sodium chloride 0.9 % bolus 1,000 mL (1,000 mLs Intravenous New Bag/Given 12/04/21 1454)    ED Course/ Medical Decision Making/ A&P Clinical Course as of 12/04/21 1508  Tue Dec 04, 2021  1320 CBC(!) Hemoglobin stable [JK]  1320 Blood gas, venous (at [facility], not at [facility])(!) No acidosis noted on venous blood gas [JK]  1429 Comprehensive metabolic panel(!!) Laboratory test show hypokalemia and an anion gap metabolic acidosis [JK]  7371 DG Abdomen Acute W/Chest X-ray findings without signs of obstruction [JK]  1450 Comprehensive metabolic panel(!!) Slight increase in LFTs noted.  Patient's had elevated LFTs on previous laboratory tests [JK]  1502 Case discussed with Dr. Linda Hedges regarding admission [JK]    Clinical Course User Index [JK] Dorie Rank, MD  Medical Decision Making Problems Addressed: Hypokalemia: acute illness or injury  that poses a threat to life or bodily functions Intractable nausea and vomiting: acute illness or injury that poses a threat to life or bodily functions Metabolic acidosis: acute illness or injury that poses a threat to life or bodily functions  Amount and/or Complexity of Data Reviewed Labs: ordered. Decision-making details documented in ED Course. Radiology: ordered and independent interpretation performed. Decision-making details documented in ED Course.  Risk Prescription drug management. Drug therapy requiring intensive monitoring for toxicity.   Patient presented to the ED for evaluation of persistent nausea and vomiting.  Patient unfortunately has history of gastroparesis and has been having recurrent issues with nausea vomiting dehydration hypokalemia.  Patient's labs do show recurrent hypokalemia.  Have started on IV potassium replacement.  Patient has been treated with IV fluids for her nausea vomiting dehydration.  She was given a dose of Reglan.  Patient does have a decreased bicarb but her sugar is only 180 and she has a normal pH.  We will check a beta hydroxybutyric acid level but I doubt DKA at this time.  Abdominal x-rays did not show any signs of obstruction.  I suspect her symptoms are related to her gastroparesis.  I spoke with Dr. Linda Hedges regarding admission     Final Clinical Impression(s) / ED Diagnoses Final diagnoses:  Intractable nausea and vomiting  Metabolic acidosis  Hypokalemia    Rx / DC Orders ED Discharge Orders     None         Dorie Rank, MD 12/04/21 6812    Dorie Rank, MD 12/04/21 1511

## 2021-12-04 NOTE — Subjective & Objective (Signed)
Anna Garcia, a 41 y/o with a long h/o DM1 with gastroparesis and frequent issues with refractory N?V and metabolic derangement. Last admission 7/30-11/20/21 for refractory N/V treated with antiemetics and IV fluids. She is followed [place]. She has tried a variety of treatments. Last recommendation was for drabinol but this was not approved by her insurance company. She now presents for recurrent/persistent N/V to [facility]. She has had multiple admissions for N/V, for DKA and other medical problems.

## 2021-12-04 NOTE — Assessment & Plan Note (Signed)
Recurrent problem associated with refractory N/V. EDP ordered 10 meq runs x 3 completed in ED  Plan F/u K level in AM

## 2021-12-04 NOTE — Assessment & Plan Note (Signed)
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 She feels this has interfered with swallow and N/V  Plan PPI  GI consult - [facility]

## 2021-12-04 NOTE — Assessment & Plan Note (Addendum)
Long standing problem. Last gastric empty study in chart is from 2002 and was nl. She has had multiple evaluations but doesn't recall completing another study. In June '23 she did have EGD with Dr. Watt Climes. She has followed with GI at [facility] - but is willing to change to [place] teams. She has tried a variety of medications with mixed results  Plan IV hydration  Zofran 8 mg TID prn  Reglan 10 mg tabs AC/HS  Gastric emptying study  GI consult - [facility] unassigned - inpatient team to call in AM

## 2021-12-05 DIAGNOSIS — E1343 Other specified diabetes mellitus with diabetic autonomic (poly)neuropathy: Secondary | ICD-10-CM

## 2021-12-05 DIAGNOSIS — R339 Retention of urine, unspecified: Secondary | ICD-10-CM | POA: Diagnosis present

## 2021-12-05 DIAGNOSIS — K529 Noninfective gastroenteritis and colitis, unspecified: Secondary | ICD-10-CM | POA: Diagnosis present

## 2021-12-05 DIAGNOSIS — D539 Nutritional anemia, unspecified: Secondary | ICD-10-CM

## 2021-12-05 DIAGNOSIS — E1010 Type 1 diabetes mellitus with ketoacidosis without coma: Secondary | ICD-10-CM | POA: Diagnosis present

## 2021-12-05 DIAGNOSIS — R112 Nausea with vomiting, unspecified: Secondary | ICD-10-CM | POA: Diagnosis not present

## 2021-12-05 DIAGNOSIS — G894 Chronic pain syndrome: Secondary | ICD-10-CM | POA: Diagnosis present

## 2021-12-05 DIAGNOSIS — N179 Acute kidney failure, unspecified: Secondary | ICD-10-CM | POA: Diagnosis present

## 2021-12-05 DIAGNOSIS — Z79899 Other long term (current) drug therapy: Secondary | ICD-10-CM | POA: Diagnosis not present

## 2021-12-05 DIAGNOSIS — K219 Gastro-esophageal reflux disease without esophagitis: Secondary | ICD-10-CM | POA: Diagnosis present

## 2021-12-05 DIAGNOSIS — Z794 Long term (current) use of insulin: Secondary | ICD-10-CM | POA: Diagnosis not present

## 2021-12-05 DIAGNOSIS — Z8249 Family history of ischemic heart disease and other diseases of the circulatory system: Secondary | ICD-10-CM | POA: Diagnosis not present

## 2021-12-05 DIAGNOSIS — I5031 Acute diastolic (congestive) heart failure: Secondary | ICD-10-CM | POA: Diagnosis not present

## 2021-12-05 DIAGNOSIS — Z7951 Long term (current) use of inhaled steroids: Secondary | ICD-10-CM | POA: Diagnosis not present

## 2021-12-05 DIAGNOSIS — E1043 Type 1 diabetes mellitus with diabetic autonomic (poly)neuropathy: Secondary | ICD-10-CM | POA: Diagnosis present

## 2021-12-05 DIAGNOSIS — E0810 Diabetes mellitus due to underlying condition with ketoacidosis without coma: Secondary | ICD-10-CM

## 2021-12-05 DIAGNOSIS — E1065 Type 1 diabetes mellitus with hyperglycemia: Secondary | ICD-10-CM

## 2021-12-05 DIAGNOSIS — M797 Fibromyalgia: Secondary | ICD-10-CM

## 2021-12-05 DIAGNOSIS — E1143 Type 2 diabetes mellitus with diabetic autonomic (poly)neuropathy: Secondary | ICD-10-CM | POA: Diagnosis not present

## 2021-12-05 DIAGNOSIS — R140 Abdominal distension (gaseous): Secondary | ICD-10-CM | POA: Diagnosis not present

## 2021-12-05 DIAGNOSIS — Z87891 Personal history of nicotine dependence: Secondary | ICD-10-CM | POA: Diagnosis not present

## 2021-12-05 DIAGNOSIS — E669 Obesity, unspecified: Secondary | ICD-10-CM | POA: Diagnosis present

## 2021-12-05 DIAGNOSIS — I959 Hypotension, unspecified: Secondary | ICD-10-CM | POA: Diagnosis present

## 2021-12-05 DIAGNOSIS — Z6831 Body mass index (BMI) 31.0-31.9, adult: Secondary | ICD-10-CM | POA: Diagnosis not present

## 2021-12-05 DIAGNOSIS — E1040 Type 1 diabetes mellitus with diabetic neuropathy, unspecified: Secondary | ICD-10-CM | POA: Diagnosis present

## 2021-12-05 DIAGNOSIS — I10 Essential (primary) hypertension: Secondary | ICD-10-CM | POA: Diagnosis present

## 2021-12-05 DIAGNOSIS — F05 Delirium due to known physiological condition: Secondary | ICD-10-CM | POA: Diagnosis not present

## 2021-12-05 DIAGNOSIS — K3184 Gastroparesis: Secondary | ICD-10-CM | POA: Diagnosis present

## 2021-12-05 DIAGNOSIS — E876 Hypokalemia: Secondary | ICD-10-CM | POA: Diagnosis present

## 2021-12-05 LAB — GLUCOSE, CAPILLARY
Glucose-Capillary: 344 mg/dL — ABNORMAL HIGH (ref 70–99)
Glucose-Capillary: 286 mg/dL — ABNORMAL HIGH (ref 70–99)
Glucose-Capillary: 270 mg/dL — ABNORMAL HIGH (ref 70–99)
Glucose-Capillary: 251 mg/dL — ABNORMAL HIGH (ref 70–99)
Glucose-Capillary: 189 mg/dL — ABNORMAL HIGH (ref 70–99)
Glucose-Capillary: 160 mg/dL — ABNORMAL HIGH (ref 70–99)
Glucose-Capillary: 192 mg/dL — ABNORMAL HIGH (ref 70–99)
Glucose-Capillary: 215 mg/dL — ABNORMAL HIGH (ref 70–99)
Glucose-Capillary: 190 mg/dL — ABNORMAL HIGH (ref 70–99)
Glucose-Capillary: 118 mg/dL — ABNORMAL HIGH (ref 70–99)
Glucose-Capillary: 103 mg/dL — ABNORMAL HIGH (ref 70–99)
Glucose-Capillary: 107 mg/dL — ABNORMAL HIGH (ref 70–99)
Glucose-Capillary: 90 mg/dL (ref 70–99)
Glucose-Capillary: 96 mg/dL (ref 70–99)
Glucose-Capillary: 101 mg/dL — ABNORMAL HIGH (ref 70–99)
Glucose-Capillary: 109 mg/dL — ABNORMAL HIGH (ref 70–99)
Glucose-Capillary: 124 mg/dL — ABNORMAL HIGH (ref 70–99)

## 2021-12-05 LAB — BASIC METABOLIC PANEL
Anion gap: 15 (ref 5–15)
Anion gap: 14 (ref 5–15)
Anion gap: 10 (ref 5–15)
Anion gap: 8 (ref 5–15)
BUN: 5 mg/dL — ABNORMAL LOW (ref 6–20)
BUN: 5 mg/dL — ABNORMAL LOW (ref 6–20)
BUN: 5 mg/dL — ABNORMAL LOW (ref 6–20)
BUN: 5 mg/dL — ABNORMAL LOW (ref 6–20)
BUN: 5 mg/dL — ABNORMAL LOW (ref 6–20)
BUN: <5 mg/dL — ABNORMAL LOW (ref 6–20)
CO2: <7 mmol/L — ABNORMAL LOW (ref 22–32)
CO2: <7 mmol/L — ABNORMAL LOW (ref 22–32)
CO2: 11 mmol/L — ABNORMAL LOW (ref 22–32)
CO2: 14 mmol/L — ABNORMAL LOW (ref 22–32)
CO2: 16 mmol/L — ABNORMAL LOW (ref 22–32)
CO2: 18 mmol/L — ABNORMAL LOW (ref 22–32)
Calcium: 7.9 mg/dL — ABNORMAL LOW (ref 8.9–10.3)
Calcium: 7.9 mg/dL — ABNORMAL LOW (ref 8.9–10.3)
Calcium: 8.5 mg/dL — ABNORMAL LOW (ref 8.9–10.3)
Calcium: 8.4 mg/dL — ABNORMAL LOW (ref 8.9–10.3)
Calcium: 8.2 mg/dL — ABNORMAL LOW (ref 8.9–10.3)
Calcium: 7.7 mg/dL — ABNORMAL LOW (ref 8.9–10.3)
Chloride: 103 mmol/L (ref 98–111)
Chloride: 107 mmol/L (ref 98–111)
Chloride: 109 mmol/L (ref 98–111)
Chloride: 110 mmol/L (ref 98–111)
Chloride: 111 mmol/L (ref 98–111)
Chloride: 111 mmol/L (ref 98–111)
Creatinine, Ser: 1.41 mg/dL — ABNORMAL HIGH (ref 0.44–1.00)
Creatinine, Ser: 1.33 mg/dL — ABNORMAL HIGH (ref 0.44–1.00)
Creatinine, Ser: 1.22 mg/dL — ABNORMAL HIGH (ref 0.44–1.00)
Creatinine, Ser: 1.04 mg/dL — ABNORMAL HIGH (ref 0.44–1.00)
Creatinine, Ser: 1.05 mg/dL — ABNORMAL HIGH (ref 0.44–1.00)
Creatinine, Ser: 1 mg/dL (ref 0.44–1.00)
GFR, Estimated: 48 mL/min — ABNORMAL LOW (ref >60)
GFR, Estimated: 52 mL/min — ABNORMAL LOW (ref >60)
GFR, Estimated: 58 mL/min — ABNORMAL LOW (ref >60)
GFR, Estimated: >60 mL/min (ref >60)
GFR, Estimated: >60 mL/min (ref >60)
GFR, Estimated: >60 mL/min (ref >60)
Glucose, Bld: 350 mg/dL — ABNORMAL HIGH (ref 70–99)
Glucose, Bld: 253 mg/dL — ABNORMAL HIGH (ref 70–99)
Glucose, Bld: 275 mg/dL — ABNORMAL HIGH (ref 70–99)
Glucose, Bld: 157 mg/dL — ABNORMAL HIGH (ref 70–99)
Glucose, Bld: 89 mg/dL (ref 70–99)
Glucose, Bld: 113 mg/dL — ABNORMAL HIGH (ref 70–99)
Potassium: 3.1 mmol/L — ABNORMAL LOW (ref 3.5–5.1)
Potassium: 3.1 mmol/L — ABNORMAL LOW (ref 3.5–5.1)
Potassium: 3.4 mmol/L — ABNORMAL LOW (ref 3.5–5.1)
Potassium: 3.7 mmol/L (ref 3.5–5.1)
Potassium: 2.7 mmol/L — CL (ref 3.5–5.1)
Potassium: 3 mmol/L — ABNORMAL LOW (ref 3.5–5.1)
Sodium: 134 mmol/L — ABNORMAL LOW (ref 135–145)
Sodium: 136 mmol/L (ref 135–145)
Sodium: 135 mmol/L (ref 135–145)
Sodium: 138 mmol/L (ref 135–145)
Sodium: 137 mmol/L (ref 135–145)
Sodium: 137 mmol/L (ref 135–145)

## 2021-12-05 LAB — BETA-HYDROXYBUTYRIC ACID
Beta-Hydroxybutyric Acid: >8 mmol/L — ABNORMAL HIGH (ref 0.05–0.27)
Beta-Hydroxybutyric Acid: 6.68 mmol/L — ABNORMAL HIGH (ref 0.05–0.27)
Beta-Hydroxybutyric Acid: 3.34 mmol/L — ABNORMAL HIGH (ref 0.05–0.27)
Beta-Hydroxybutyric Acid: 0.54 mmol/L — ABNORMAL HIGH (ref 0.05–0.27)

## 2021-12-05 LAB — BLOOD GAS, ARTERIAL
Acid-base deficit: 26 mmol/L — ABNORMAL HIGH (ref 0.0–2.0)
Bicarbonate: 2.2 mmol/L — ABNORMAL LOW (ref 20.0–28.0)
Drawn by: 54496
O2 Saturation: 95.6 %
Patient temperature: 37
RATE: 117 resp/min
pCO2 arterial: <18 mmHg — CL (ref 32–48)
pH, Arterial: 7.05 — CL (ref 7.35–7.45)
pO2, Arterial: 94 mmHg (ref 83–108)

## 2021-12-05 LAB — CBC
HCT: 34.5 % — ABNORMAL LOW (ref 36.0–46.0)
Hemoglobin: 10.4 g/dL — ABNORMAL LOW (ref 12.0–15.0)
MCH: 36 pg — ABNORMAL HIGH (ref 26.0–34.0)
MCHC: 30.1 g/dL (ref 30.0–36.0)
MCV: 119.4 fL — ABNORMAL HIGH (ref 80.0–100.0)
Platelets: 202 10*3/uL (ref 150–400)
RBC: 2.89 MIL/uL — ABNORMAL LOW (ref 3.87–5.11)
RDW: 19.3 % — ABNORMAL HIGH (ref 11.5–15.5)
WBC: 11.3 10*3/uL — ABNORMAL HIGH (ref 4.0–10.5)
nRBC: 2 % — ABNORMAL HIGH (ref 0.0–0.2)

## 2021-12-05 LAB — MRSA NEXT GEN BY PCR, NASAL: MRSA by PCR Next Gen: NOT DETECTED

## 2021-12-05 LAB — MAGNESIUM
Magnesium: 2.1 mg/dL (ref 1.7–2.4)
Magnesium: 1.5 mg/dL — ABNORMAL LOW (ref 1.7–2.4)

## 2021-12-05 LAB — LACTIC ACID, PLASMA: Lactic Acid, Venous: 7.5 mmol/L (ref 0.5–1.9)

## 2021-12-05 MED ORDER — LORAZEPAM 2 MG/ML IJ SOLN
0.5000 mg | Freq: Once | INTRAMUSCULAR | Status: AC | PRN
Start: 2021-12-05 — End: 2021-12-05
  Administered 2021-12-05: 0.5 mg via INTRAVENOUS
  Filled 2021-12-05: qty 1

## 2021-12-05 MED ORDER — PANTOPRAZOLE SODIUM 40 MG IV SOLR
40.0000 mg | Freq: Every day | INTRAVENOUS | Status: DC
  Administered 2021-12-05 – 2021-12-06 (×2): 40 mg via INTRAVENOUS
  Filled 2021-12-05 (×2): qty 10

## 2021-12-05 MED ORDER — LACTATED RINGERS IV SOLN: INTRAVENOUS | Status: DC

## 2021-12-05 MED ORDER — INSULIN ASPART 100 UNIT/ML IJ SOLN
5.0000 [IU] | Freq: Once | INTRAMUSCULAR | Status: AC
Start: 2021-12-05 — End: 2021-12-05
  Administered 2021-12-05: 5 [IU] via SUBCUTANEOUS

## 2021-12-05 MED ORDER — CHLORHEXIDINE GLUCONATE CLOTH 2 % EX PADS
6.0000 | MEDICATED_PAD | Freq: Every day | CUTANEOUS | Status: DC
Start: 2021-12-05 — End: 2021-12-09
  Administered 2021-12-06 – 2021-12-08 (×3): 6 via TOPICAL

## 2021-12-05 MED ORDER — INSULIN REGULAR(HUMAN) IN NACL 100-0.9 UT/100ML-% IV SOLN
INTRAVENOUS | Status: DC
Start: 2021-12-05 — End: 2021-12-07
  Administered 2021-12-05: 7.5 [IU]/h via INTRAVENOUS
  Filled 2021-12-05: qty 100

## 2021-12-05 MED ORDER — LORAZEPAM 2 MG/ML IJ SOLN
INTRAMUSCULAR | Status: AC
  Filled 2021-12-05: qty 1

## 2021-12-05 MED ORDER — LORAZEPAM 2 MG/ML IJ SOLN
0.5000 mg | INTRAMUSCULAR | Status: DC | PRN
  Administered 2021-12-05: 0.5 mg via INTRAVENOUS
  Filled 2021-12-05: qty 1

## 2021-12-05 MED ORDER — LACTATED RINGERS IV BOLUS
1000.0000 mL | Freq: Once | INTRAVENOUS | Status: AC
  Administered 2021-12-05: 1000 mL via INTRAVENOUS

## 2021-12-05 MED ORDER — SODIUM CHLORIDE 0.9% FLUSH
10.0000 mL | INTRAVENOUS | Status: DC | PRN
  Administered 2021-12-09 – 2021-12-10 (×7): 10 mL

## 2021-12-05 MED ORDER — PROCHLORPERAZINE EDISYLATE 10 MG/2ML IJ SOLN
5.0000 mg | Freq: Once | INTRAMUSCULAR | Status: AC
  Administered 2021-12-05: 5 mg via INTRAVENOUS
  Filled 2021-12-05: qty 2

## 2021-12-05 MED ORDER — POTASSIUM CHLORIDE 10 MEQ/100ML IV SOLN
10.0000 meq | INTRAVENOUS | Status: AC
  Administered 2021-12-05 (×4): 10 meq via INTRAVENOUS
  Filled 2021-12-05 (×4): qty 100

## 2021-12-05 MED ORDER — SODIUM CHLORIDE 0.9% FLUSH
10.0000 mL | Freq: Two times a day (BID) | INTRAVENOUS | Status: DC
  Administered 2021-12-05 – 2021-12-11 (×10): 10 mL

## 2021-12-05 MED ORDER — SODIUM CHLORIDE 0.9 % IV SOLN
40.0000 mg | Freq: Two times a day (BID) | INTRAVENOUS | Status: DC
  Administered 2021-12-05 – 2021-12-06 (×4): 40 mg via INTRAVENOUS
  Filled 2021-12-05 (×5): qty 4

## 2021-12-05 MED ORDER — LORAZEPAM 2 MG/ML IJ SOLN: 0.5000 mg | Freq: Once | INTRAMUSCULAR | Status: DC

## 2021-12-05 MED ORDER — GABAPENTIN 300 MG PO CAPS
300.0000 mg | ORAL_CAPSULE | Freq: Three times a day (TID) | ORAL | Status: DC
  Administered 2021-12-05 – 2021-12-11 (×17): 300 mg via ORAL
  Filled 2021-12-05 (×17): qty 1

## 2021-12-05 MED ORDER — DEXTROSE 50 % IV SOLN: 0.0000 mL | INTRAVENOUS | Status: DC | PRN

## 2021-12-05 MED ORDER — POTASSIUM CHLORIDE 10 MEQ/100ML IV SOLN
10.0000 meq | INTRAVENOUS | Status: AC
  Administered 2021-12-05 – 2021-12-06 (×6): 10 meq via INTRAVENOUS
  Filled 2021-12-05 (×6): qty 100

## 2021-12-05 MED ORDER — DEXTROSE IN LACTATED RINGERS 5 % IV SOLN: INTRAVENOUS | Status: DC

## 2021-12-05 MED ORDER — SIMETHICONE 80 MG PO CHEW
80.0000 mg | CHEWABLE_TABLET | Freq: Four times a day (QID) | ORAL | Status: DC | PRN
Start: 2021-12-05 — End: 2021-12-11
  Administered 2021-12-06 – 2021-12-07 (×4): 80 mg via ORAL
  Filled 2021-12-05 (×4): qty 1

## 2021-12-05 MED ORDER — MAGNESIUM SULFATE 2 GM/50ML IV SOLN
2.0000 g | Freq: Once | INTRAVENOUS | Status: AC
  Administered 2021-12-06: 2 g via INTRAVENOUS

## 2021-12-05 NOTE — Progress Notes (Signed)
During shift, provider Sarajane Jews, MD was made are of low BP and decrease urinary output. Pt had a straight cath and 1374m was noted after. Pt received 1 liter of LR and will receive another before end of shift. Pt is allowed sips with meds but is NPO aside from that. During straight cath nurse noted white thick discharge at the site. No new orders at this time. Will continue to monitor.

## 2021-12-05 NOTE — Progress Notes (Signed)
Rapid Response Event Note   Reason for Call : Question about pt status   Initial Focused Assessment: pt having trouble answering questions.  Pupils equal and reactive.  Moaning and holding her stomach c/o pain.  Pt is tachypnea and mouth breathing.  Able to follow some simple commands.  Breath sounds decreased.  Pt has been placed on 3 L [place].  Pulses intact.    Interventions: Pt placed on monitor, ST.  [facility], NP called suspicious pt is in DKA? Labs ordered see chart.  See flow sheet for VS.  Plan of Care: Pt to transfer to ICU per [facility], NP   Event Summary:   MD Notified: yes Call Time: 0215 Arrival Time: 0200 End Time: 0300  Dyann Ruddle, RN

## 2021-12-05 NOTE — TOC Initial Note (Signed)
Transition of Care [facility]) - Initial/Assessment Note    Patient Details  Name: Anna Garcia MRN: [id number] Date of Birth: 1981/02/04  Transition of Care [facility]) CM/SW Contact:    Leeroy Cha, RN Phone Number: 12/05/2021, 7:29 AM  Clinical Narrative:                  Transition of Care [facility]) Screening Note   Patient Details  Name: Anna Garcia Date of Birth: [birth year]   Transition of Care [facility]) CM/SW Contact:    Leeroy Cha, RN Phone Number: 12/05/2021, 7:30 AM    Transition of Care Department [facility]) has reviewed patient and no TOC needs have been identified at this time. We will continue to monitor patient advancement through interdisciplinary progression rounds. If new patient transition needs arise, please place a TOC consult.          Patient Goals and CMS Choice        Expected Discharge Plan and Services                                                Prior Living Arrangements/Services                       Activities of Daily Living Home Assistive Devices/Equipment: CBG Meter ADL Screening (condition at time of admission) Patient's cognitive ability adequate to safely complete daily activities?: Yes Is the patient deaf or have difficulty hearing?: No Does the patient have difficulty seeing, even when wearing glasses/contacts?: No Does the patient have difficulty concentrating, remembering, or making decisions?: No Patient able to express need for assistance with ADLs?: Yes Does the patient have difficulty dressing or bathing?: Yes Independently performs ADLs?: No Communication: Independent Dressing (OT): Needs assistance Is this a change from baseline?: Change from baseline, expected to last >3 days Grooming: Independent Feeding: Independent Bathing: Needs assistance Is this a change from baseline?: Change from baseline, expected to last >3 days Toileting: Needs assistance Is this a change from baseline?: Change from  baseline, expected to last >3days In/Out Bed: Needs assistance Is this a change from baseline?: Change from baseline, expected to last >3 days Walks in Home: Needs assistance Is this a change from baseline?: Change from baseline, expected to last >3 days Does the patient have difficulty walking or climbing stairs?: Yes Weakness of Legs: Both Weakness of Arms/Hands: Both  Permission Sought/Granted                  Emotional Assessment              Admission diagnosis:  Hypokalemia [H88.5] Metabolic acidosis [O27.74] Intractable nausea and vomiting [R11.2] Refractory nausea and vomiting [R11.2] Patient Active Problem List   Diagnosis Date Noted   Refractory nausea and vomiting 12/04/2021   High anion gap metabolic acidosis 12/87/8676   GERD (gastroesophageal reflux disease) 11/19/2021   Moderate persistent asthma 11/19/2021   Hypomagnesemia 09/23/2021   Hypophosphatemia 09/23/2021   Fever 05/27/2021   Left genital labial abscess 05/25/2021   Sinus tachycardia    Labial swelling: Left 05/24/2021   Vulvar abscess 05/24/2021   Elevated brain natriuretic peptide (BNP) level 05/22/2021   Overweight (BMI 25.0-29.9) 05/22/2021   Hypokalemia 05/21/2021   DKA, type 1 ([place]) 10/12/2020   Fibromyalgia 10/12/2020   CKD stage G3a/A3, GFR 45-59 and albumin creatinine  ratio >300 mg/g ([place]) 10/12/2020   DKA (diabetic ketoacidosis) ([place]) 07/05/2020   SIRS (systemic inflammatory response syndrome) ([place]) 07/05/2020   Asthma 07/05/2020   AKI (acute kidney injury) ([place]) 07/05/2020   Transaminitis 07/05/2020   Hypertension    Gastroparesis    Uncontrolled type 1 diabetes mellitus with hyperglycemia, with long-term current use of insulin ([place]) 06/19/2006   NEUROPATHY, PERIPHERAL 06/19/2006   ACNE 06/19/2006   PCP:  Day, [name], MD Pharmacy:   [facility] [place], [place] - [address] [place] [address] Phone: [phone] Fax:  [phone]     Social Determinants of Health ([place]) Interventions    Readmission Risk Interventions    09/24/2021    1:14 PM  Readmission Risk Prevention Plan  HRI or [place] Complete  Social Work Consult for [place] Planning/Counseling Complete  Palliative Care Screening Not Applicable

## 2021-12-05 NOTE — Progress Notes (Signed)
Nurse placed an order for IV team consult. The patient is a hard stick. Pt has orders for bolus fluids and the IV for her insulin is positional. All IV's are noted in the [facility] area.  More access is needed at this time. Nurse spoke with Kristin Bruins, RN from IV team. Will continue to monitor.

## 2021-12-05 NOTE — Hospital Course (Addendum)
41 year old woman PMH diabetes mellitus type 1 with diabetic gastroparesis, frequent and recurrent refractory nausea and vomiting and metabolic derangement, recently hospitalized for same, presented with nausea and vomiting.  Admitted for nausea, vomiting and abdominal pain with plan for IV hydration, antiemetics.    Admitted July 30 through August 1 after presenting with intractable nausea and vomiting.  Discharge diagnoses intractable nausea and vomiting, anion gap metabolic acidosis, comorbidities include diabetes mellitus type 1, gastroparesis.  Discharged on long-acting insulin and carb coverage.  --8/21 improving, tolerating increased diet, still has LE edema.  Monitor on diet advancement and likely home in next 48 hours if continues to improve.

## 2021-12-05 NOTE — Progress Notes (Signed)
Progress Note   Patient: Anna Garcia IRJ:[id number] DOB: 06/26/1980 DOA: 12/04/2021     0 DOS: the patient was seen and examined on 12/05/2021   Brief hospital course: 41 year old woman PMH diabetes mellitus type 1 with diabetic gastroparesis, frequent and recurrent refractory nausea and vomiting and metabolic derangement, recently hospitalized for same, presented with nausea and vomiting.  Admitted for nausea, vomiting and abdominal pain with plan for IV hydration, antiemetics.  Consider outpatient gastric emptying study.  Admitted July 30 through August 1 after presenting with intractable nausea and vomiting.  Discharge diagnoses intractable nausea and vomiting, anion gap metabolic acidosis, comorbidities include diabetes mellitus type 1, gastroparesis.  Discharged on long-acting insulin and carb coverage. Followed by gastroenterology in [place], failed meds including promethazine, recommended for dronabinol, apparently not approved by [facility].  --8/16 overnight had trouble answering questions, complaining of abdominal pain, tachypneic and mouth breathing, started on oxygen, no hypoxia documented.  Transferred to ICU for DKA.  Assessment and Plan: Severe DKA with anion gap metabolic acidosis, pH 3.01, CO2 undetectable, complicated by associated acute kidney injury. -- Still in DKA with unmeasurable anion gap and high beta hydroxybutyric acid -- Continue IV insulin infusion, IV fluids, strict intake and output ordered  DM type 1 w/ diabetic gastroparesis -- [facility] GE study 2015 showed: Decreased 2-hour gastric retention. Study was terminated early as the patient vomited. -- Supportive care  Acute kidney injury with baseline creatinine 0.96.  Secondary to DKA. -- continue IV fluids.  Follow urine output.  Check BMP in AM. Hold losartan, Bactrim.  Macrocytic anemia, longstanding.  B12 within normal limits in June. -- Hemoglobin stable  Questionable small bowel wall thickening in  the left abdomen, which could reflect enteritis. No obstruction. -- Monitor clinically.  Chronic pain syndrome and fibromyalgia, managed by [facility] pain management with gabapentin.      Subjective:  Transferred to stepdown unit overnight for DKA -- Feels nauseous, has some mild abdominal pain.  Last bowel movement last evening.  No vomiting.  Breathing okay.  Physical Exam: Vitals:   12/05/21 0500 12/05/21 0600 12/05/21 0700 12/05/21 0800  BP: (!) 104/57 108/75 120/77 121/79  Pulse: (!) 103 (!) 108 (!) 108 (!) 116  Resp: (!) 30 (!) 26 (!) 29 (!) 27  Temp:  (!) 97 F (36.1 C)  (!) 97.4 F (36.3 C)  TempSrc:  Oral  Axillary  SpO2: 100% 100% 100% 100%   Physical Exam Vitals reviewed.  Constitutional:      General: She is not in acute distress.    Appearance: She is ill-appearing. She is not toxic-appearing.  HENT:     Mouth/Throat:     Comments: Tongue appears normal Cardiovascular:     Rate and Rhythm: Regular rhythm. Tachycardia present.     Comments: Telemetry ST Pulmonary:     Effort: Pulmonary effort is normal. No respiratory distress.     Breath sounds: No wheezing, rhonchi or rales.  Abdominal:     General: There is distension.     Tenderness: There is no abdominal tenderness. There is no guarding.  Musculoskeletal:     Right lower leg: No edema.     Left lower leg: No edema.  Neurological:     General: No focal deficit present.     Mental Status: She is alert.  Psychiatric:        Mood and Affect: Mood normal.        Behavior: Behavior normal.     Data Reviewed:  UOP not recorded I/O does not appear accurate Potassium 3.1, replete Creatinine 1.33, improved from 1.41 Beta hydroxybutyric acid 6.68, now quantifiable  Family Communication: none  Disposition: Status is: Observation Change to inpatient > DKA on IV insulin, will need >2 midnights   Planned Discharge Destination: Home    Time spent: 35 minutes  Author: Murray Hodgkins,  MD 12/05/2021 10:20 AM  For on call review [email or web address].

## 2021-12-05 NOTE — Progress Notes (Signed)
Patient has not had any u/o for first shift. After bladder scan it was noted that patient had 959m in bladder. Per provider GSarajane Jews MD pt will need a I/O cath. Provider will place order. Will continue to monitor.

## 2021-12-05 NOTE — Progress Notes (Signed)

## 2021-12-05 NOTE — Progress Notes (Addendum)
Overnight transfer to ICU    Anna Garcia QIH:[id number] DOB: Nov 12, 1980 DOA: 12/04/2021  PCP: Day, [name], MD  Patient coming from: [facility]   Chief Complaint: DKA  HPI: Anna Garcia is a 41 y.o. female with medical history significant of   ED Course:     Patient was seen in ER has a history of diabetes hypertension, neuropathy, gastroparesis. Stated she had abdominal discomfort in ER. Was seen in the emergency room on June 3 for DKA. Was admitted to the hospital on July 30 for hypokalemia with intractable nausea and vomiting. Patient sees a GI doctor in [place].  She had an EGD on June 5 Patient admitted to the floor, labs 12/04/21 show DKA.  Past Medical History:  Diagnosis Date   Diabetes mellitus    Gastroparesis    Hypertension    Neuropathy     Past Surgical History:  Procedure Laterality Date   ESOPHAGOGASTRODUODENOSCOPY (EGD) WITH PROPOFOL N/A 09/24/2021   Procedure: ESOPHAGOGASTRODUODENOSCOPY (EGD) WITH PROPOFOL;  Surgeon: Clarene Essex, MD;  Location: [facility] END[facility];  Service: Gastroenterology;  Laterality: N/A;   SAVORY DILATION N/A 09/24/2021   Procedure: SAVORY DILATION - 16 MM;  Surgeon: Clarene Essex, MD;  Location: [facility]OS[facility];  Service: Gastroenterology;  Laterality: N/A;    Social History  reports that she has quit smoking. Her smoking use included cigarettes. She smoked an average of .5 packs per day. She has never used smokeless tobacco. She reports that she does not drink alcohol and does not use drugs.  No Known Allergies  Family History  Problem Relation Age of Onset   Hypertension Mother    Dementia Mother    Heart attack Father    Hypertension Father    Heart disease Father    Hypertension Brother    Stroke Maternal Grandfather       Prior to Admission medications   Medication Sig Start Date End Date Taking? Authorizing Provider  BAQSIMI TWO PACK 3 MG/DOSE POWD Place 1 spray into both nostrils as needed (for a very low blood sugar  emergency, when unable to eat or drink and need help from someone else- or as otherwise directed). 10/30/21  Yes [provider]  DEXILANT 60 MG capsule Take 1 capsule (60 mg total) by mouth daily. 09/26/21  Yes Regalado, Belkys A, MD  famotidine (PEPCID) 40 MG tablet Take 40 mg by mouth 2 (two) times daily. 05/28/19  Yes [provider]  gabapentin (NEURONTIN) 300 MG capsule Take 1 capsule (300 mg total) by mouth 3 (three) times daily. 05/28/21  Yes Eugenie Filler, MD  granisetron (KYTRIL) 1 MG tablet Take 1 mg by mouth every 12 (twelve) hours. 11/05/21  Yes [provider]  insulin glargine (LANTUS SOLOSTAR) 100 UNIT/ML Solostar Pen Inject 22 Units into the skin daily. Patient taking differently: Inject 7 Units into the skin at bedtime. 09/26/21  Yes Regalado, Belkys A, MD  insulin glulisine (APIDRA SOLOSTAR) 100 UNIT/ML Solostar Pen Inject 0-8 Units into the skin See admin instructions. Inject 0-8 units into the skin daily AS NEEDED if blood glucose levels are trending upwards- changes as per carb intake 07/13/20  Yes [provider]  loperamide (IMODIUM) 2 MG capsule Take 1 capsule (2 mg total) by mouth as needed for diarrhea or loose stools. 09/26/21  Yes Regalado, Belkys A, MD  losartan (COZAAR) 25 MG tablet Take 25 mg by mouth daily.   Yes [provider]  mometasone-formoterol (DULERA) 100-5 MCG/ACT AERO Inhale 2  puffs into the lungs 2 (two) times daily. 07/07/20  Yes Mercy Riding, MD  montelukast (SINGULAIR) 10 MG tablet Take 10 mg by mouth at bedtime. 05/24/19  Yes [provider]  MOTEGRITY 1 MG TABS Take 1 mg by mouth daily. 08/31/21  Yes [provider]  rosuvastatin (CRESTOR) 20 MG tablet Take 20 mg by mouth at bedtime. 05/24/19  Yes [provider]  sulfamethoxazole-trimethoprim (BACTRIM DS) 800-160 MG tablet Take 1 tablet by mouth 2 (two) times daily. 11/30/21 12/06/21 Yes [provider]  Vitamin D, Ergocalciferol,  (DRISDOL) 1.25 MG (50000 UNIT) CAPS capsule Take 1 capsule (50,000 Units total) by mouth every 7 (seven) days. Patient not taking: Reported on 12/04/2021 10/02/21   Regalado, Jerald Kief A, MD  insulin aspart (NOVOLOG) 100 UNIT/ML injection Inject 3 units three times a day with meals if you eat over 50% of your meals. In addition, use sliding scale as follows: CBG 70 - 120: 0 units CBG 121 - 150: 2 units CBG 151 - 200: 3 units CBG 201 - 250: 5 units CBG 251 - 300: 8 units CBG 301 - 350: 11 units CBG 351 - 400: 15 units CBG > 400: call MD and obtain STAT lab verification Patient not taking: No sig reported 07/07/20 10/13/20  Mercy Riding, MD    Physical Exam: Obvious distress, deep mouth breathing respirations, tachycardia, anxious    Vitals:   12/04/21 2147 12/04/21 2212 12/05/21 0142 12/05/21 0309  BP:  105/61 (!) 135/100 112/71  Pulse:  97 (!) 120 (!) 116  Resp:  16 (!) 24 (!) 22  Temp:  98.1 F (36.7 C)  (!) 96.5 F (35.8 C)  TempSrc:  Oral  Rectal  SpO2: 97% 100% 99% 100%    Vitals:   12/04/21 2147 12/04/21 2212 12/05/21 0142 12/05/21 0309  BP:  105/61 (!) 135/100 112/71  Pulse:  97 (!) 120 (!) 116  Resp:  16 (!) 24 (!) 22  Temp:  98.1 F (36.7 C)  (!) 96.5 F (35.8 C)  TempSrc:  Oral  Rectal  SpO2: 97% 100% 99% 100%    CBC: Recent Labs  Lab 12/04/21 1144 12/05/21 0222  WBC 7.2 11.3*  HGB 11.2* 10.4*  HCT 33.5* 34.5*  MCV 108.1* 119.4*  PLT 150 007    Basic Metabolic Panel: Recent Labs  Lab 12/04/21 1144  NA 137  K 2.6*  CL 104  CO2 14*  GLUCOSE 185*  BUN 5*  CREATININE 1.26*  CALCIUM 9.0    GFR: CrCl cannot be calculated (Unknown ideal weight.).  Liver Function Tests: Recent Labs  Lab 12/04/21 1144  AST 58*  ALT 38  ALKPHOS 176*  BILITOT 2.6*  PROT 6.8  ALBUMIN 3.1*  ==============================================  ==================================== Update: Labs  12/05/21  0425 hrs    Latest Reference Range & Units 12/05/21 62:26  BASIC  METABOLIC PANEL  Rpt !  Sodium 135 - 145 mmol/L 134 (L)  Potassium 3.5 - 5.1 mmol/L 3.1 (L)  Chloride 98 - 111 mmol/L 103  CO2 22 - 32 mmol/L <7 (L)  Glucose 70 - 99 mg/dL 350 (H)  BUN 6 - 20 mg/dL 5 (L)  Creatinine 0.44 - 1.00 mg/dL 1.41 (H)  Calcium 8.9 - 10.3 mg/dL 7.9 (L)  Anion gap 5 - 15  NOT CALCULATED (C)  Magnesium 1.7 - 2.4 mg/dL 2.1    Latest Reference Range & Units 12/05/21 02:22  GFR, Estimated >60 mL/min 48 (L)     Latest  Reference Range & Units 12/05/21 02:22  Beta-Hydroxybutyric Acid 0.05 - 0.27 mmol/L >8.00 (H)  Glucose 70 - 99 mg/dL 350 (H)  (H): Data is abnormally high  ===================================================== Resume previous results   Urine analysis:    Component Value Date/Time   COLORURINE YELLOW 11/19/2021 0138   APPEARANCEUR HAZY (A) 11/19/2021 0138   LABSPEC 1.001 (L) 11/19/2021 0138   PHURINE 7.0 11/19/2021 0138   GLUCOSEU >=500 (A) 11/19/2021 0138   HGBUR MODERATE (A) 11/19/2021 0138   BILIRUBINUR NEGATIVE 11/19/2021 0138   KETONESUR 5 (A) 11/19/2021 0138   PROTEINUR 30 (A) 11/19/2021 0138   UROBILINOGEN 0.2 04/11/2011 1437   NITRITE NEGATIVE 11/19/2021 0138   LEUKOCYTESUR NEGATIVE 11/19/2021 0138    Radiological Exams on Admission: DG Abdomen Acute W/Chest  Result Date: 12/04/2021 CLINICAL DATA:  Nausea and vomiting with abdominal pain. EXAM: DG ABDOMEN ACUTE WITH 1 VIEW CHEST COMPARISON:  Chest x-ray dated November 18, 2021. FINDINGS: There is no evidence of dilated bowel loops or free intraperitoneal air. Questionable small bowel wall thickening in the left abdomen. No radiopaque calculi or other significant radiographic abnormality is seen. IUD in the pelvis. Heart size and mediastinal contours are within normal limits. Minimal linear atelectasis at the lung bases. No focal consolidation, pleural effusion, or pneumothorax. No acute osseous abnormality. IMPRESSION: 1. Questionable small bowel wall thickening in the left abdomen, which  could reflect enteritis. No obstruction. 2. Minimal bibasilar atelectasis. No acute cardiopulmonary disease. Electronically Signed   By: Titus Dubin M.D.   On: 12/04/2021 13:49    EKG: Independently reviewed.   Assessment/Plan Principal Problem: DKA  - insulin drip /Endotool started  Refractory nausea and vomiting  Active Problems:   Uncontrolled type 1 diabetes mellitus with hyperglycemia, with long-term current use of insulin (HCC)   Gastroparesis   Hypokalemia   GERD (gastroesophageal reflux disease)  Code Status:  Full   Patient is from:  [place]    Status: Transferred to ICU    [place]   12/05/2021, 3:42 AM

## 2021-12-05 NOTE — Progress Notes (Signed)
Endotool says consider transitioning patient. Provider Sarajane Jews, MD notified.

## 2021-12-05 NOTE — Inpatient Diabetes Management (Signed)
Inpatient Diabetes Program Recommendations  AACE/ADA: New Consensus Statement on Inpatient Glycemic Control (2015)  Target Ranges:  Prepandial:   less than 140 mg/dL      Peak postprandial:   less than 180 mg/dL (1-2 hours)      Critically ill patients:  140 - 180 mg/dL   Lab Results  Component Value Date   GLUCAP 270 (H) 12/05/2021   HGBA1C 7.6 (H) 11/18/2021    Review of Glycemic Control  Diabetes history: DM1 Outpatient Diabetes medications: Lantus 7 units QHS, Apidra 0-8 TID Current orders for Inpatient glycemic control: IV insulin per EndoTool for DKA  HgbA1C - 7.6%  Inpatient Diabetes Program Recommendations:    Continue IV insulin until criteria met for discontinuation of drip.  Give basal insulin 1-2H prior to discontinuation of drip. Semglee 7 units Q24H Novolog 0-9 units  Add meal coverage 2 units TID with meals if eating > 50%  Diabetes Coordinators have spoken to pt on numerous occasions regarding her diabetes control. Hx multiple admissions.  Continue to follow.  Thank you. Lorenda Peck, RD, LDN, [place] Inpatient Diabetes Coordinator [phone]

## 2021-12-06 ENCOUNTER — Inpatient Hospital Stay (HOSPITAL_COMMUNITY): Payer: Medicare Other

## 2021-12-06 DIAGNOSIS — G894 Chronic pain syndrome: Secondary | ICD-10-CM | POA: Diagnosis not present

## 2021-12-06 DIAGNOSIS — E0810 Diabetes mellitus due to underlying condition with ketoacidosis without coma: Secondary | ICD-10-CM | POA: Diagnosis not present

## 2021-12-06 DIAGNOSIS — N179 Acute kidney failure, unspecified: Secondary | ICD-10-CM | POA: Diagnosis not present

## 2021-12-06 DIAGNOSIS — E1343 Other specified diabetes mellitus with diabetic autonomic (poly)neuropathy: Secondary | ICD-10-CM | POA: Diagnosis not present

## 2021-12-06 LAB — BASIC METABOLIC PANEL
Anion gap: 7 (ref 5–15)
Anion gap: 10 (ref 5–15)
BUN: <5 mg/dL — ABNORMAL LOW (ref 6–20)
BUN: <5 mg/dL — ABNORMAL LOW (ref 6–20)
CO2: 18 mmol/L — ABNORMAL LOW (ref 22–32)
CO2: 16 mmol/L — ABNORMAL LOW (ref 22–32)
Calcium: 7.9 mg/dL — ABNORMAL LOW (ref 8.9–10.3)
Calcium: 8 mg/dL — ABNORMAL LOW (ref 8.9–10.3)
Chloride: 111 mmol/L (ref 98–111)
Chloride: 109 mmol/L (ref 98–111)
Creatinine, Ser: 0.93 mg/dL (ref 0.44–1.00)
Creatinine, Ser: 0.84 mg/dL (ref 0.44–1.00)
GFR, Estimated: >60 mL/min (ref >60)
GFR, Estimated: >60 mL/min (ref >60)
Glucose, Bld: 144 mg/dL — ABNORMAL HIGH (ref 70–99)
Glucose, Bld: 178 mg/dL — ABNORMAL HIGH (ref 70–99)
Potassium: 3.3 mmol/L — ABNORMAL LOW (ref 3.5–5.1)
Potassium: 3.4 mmol/L — ABNORMAL LOW (ref 3.5–5.1)
Sodium: 136 mmol/L (ref 135–145)
Sodium: 135 mmol/L (ref 135–145)

## 2021-12-06 LAB — GLUCOSE, CAPILLARY
Glucose-Capillary: 147 mg/dL — ABNORMAL HIGH (ref 70–99)
Glucose-Capillary: 147 mg/dL — ABNORMAL HIGH (ref 70–99)
Glucose-Capillary: 147 mg/dL — ABNORMAL HIGH (ref 70–99)
Glucose-Capillary: 153 mg/dL — ABNORMAL HIGH (ref 70–99)
Glucose-Capillary: 167 mg/dL — ABNORMAL HIGH (ref 70–99)
Glucose-Capillary: 176 mg/dL — ABNORMAL HIGH (ref 70–99)
Glucose-Capillary: 189 mg/dL — ABNORMAL HIGH (ref 70–99)
Glucose-Capillary: 204 mg/dL — ABNORMAL HIGH (ref 70–99)
Glucose-Capillary: 238 mg/dL — ABNORMAL HIGH (ref 70–99)
Glucose-Capillary: 240 mg/dL — ABNORMAL HIGH (ref 70–99)
Glucose-Capillary: 249 mg/dL — ABNORMAL HIGH (ref 70–99)
Glucose-Capillary: 281 mg/dL — ABNORMAL HIGH (ref 70–99)

## 2021-12-06 LAB — BETA-HYDROXYBUTYRIC ACID: Beta-Hydroxybutyric Acid: 0.31 mmol/L — ABNORMAL HIGH (ref 0.05–0.27)

## 2021-12-06 MED ORDER — INSULIN GLARGINE-YFGN 100 UNIT/ML ~~LOC~~ SOLN
7.0000 [IU] | Freq: Every day | SUBCUTANEOUS | Status: DC
Start: 2021-12-06 — End: 2021-12-08
  Administered 2021-12-06 – 2021-12-08 (×3): 7 [IU] via SUBCUTANEOUS
  Filled 2021-12-06 (×4): qty 0.07

## 2021-12-06 MED ORDER — SODIUM CHLORIDE 0.9 % IV BOLUS
500.0000 mL | Freq: Once | INTRAVENOUS | Status: AC
  Administered 2021-12-06: 500 mL via INTRAVENOUS

## 2021-12-06 MED ORDER — SODIUM CHLORIDE 0.9 % IV SOLN: INTRAVENOUS | Status: DC

## 2021-12-06 MED ORDER — IBUPROFEN 200 MG PO TABS
400.0000 mg | ORAL_TABLET | Freq: Once | ORAL | Status: AC
  Administered 2021-12-06: 400 mg via ORAL
  Filled 2021-12-06: qty 2

## 2021-12-06 MED ORDER — SODIUM CHLORIDE 0.9 % IV BOLUS
500.0000 mL | Freq: Once | INTRAVENOUS | Status: AC
Start: 2021-12-06 — End: 2021-12-06
  Administered 2021-12-06: 500 mL via INTRAVENOUS

## 2021-12-06 MED ORDER — ENSURE MAX PROTEIN PO LIQD
11.0000 [oz_av] | Freq: Two times a day (BID) | ORAL | Status: DC
  Administered 2021-12-06 – 2021-12-09 (×7): 11 [oz_av] via ORAL
  Filled 2021-12-06 (×2): qty 330

## 2021-12-06 MED ORDER — INSULIN ASPART 100 UNIT/ML IJ SOLN
0.0000 [IU] | Freq: Three times a day (TID) | INTRAMUSCULAR | Status: DC
  Administered 2021-12-06 – 2021-12-07 (×2): 3 [IU] via SUBCUTANEOUS
  Administered 2021-12-07: 5 [IU] via SUBCUTANEOUS
  Administered 2021-12-07: 7 [IU] via SUBCUTANEOUS
  Administered 2021-12-08: 9 [IU] via SUBCUTANEOUS
  Administered 2021-12-08: 7 [IU] via SUBCUTANEOUS
  Administered 2021-12-08: 5 [IU] via SUBCUTANEOUS
  Administered 2021-12-09: 9 [IU] via SUBCUTANEOUS
  Administered 2021-12-09: 5 [IU] via SUBCUTANEOUS
  Administered 2021-12-09: 7 [IU] via SUBCUTANEOUS
  Administered 2021-12-10 (×2): 5 [IU] via SUBCUTANEOUS
  Administered 2021-12-10: 7 [IU] via SUBCUTANEOUS

## 2021-12-06 MED ORDER — ADULT MULTIVITAMIN W/MINERALS CH
1.0000 | ORAL_TABLET | Freq: Every day | ORAL | Status: DC
  Administered 2021-12-06 – 2021-12-11 (×6): 1 via ORAL
  Filled 2021-12-06 (×6): qty 1

## 2021-12-06 MED ORDER — INSULIN ASPART 100 UNIT/ML IJ SOLN
2.0000 [IU] | Freq: Three times a day (TID) | INTRAMUSCULAR | Status: DC
  Administered 2021-12-06 – 2021-12-07 (×4): 2 [IU] via SUBCUTANEOUS

## 2021-12-06 MED ORDER — POTASSIUM CHLORIDE CRYS ER 20 MEQ PO TBCR
20.0000 meq | EXTENDED_RELEASE_TABLET | Freq: Once | ORAL | Status: AC
  Administered 2021-12-06: 20 meq via ORAL
  Filled 2021-12-06: qty 1

## 2021-12-06 MED ORDER — GLUCERNA SHAKE PO LIQD
237.0000 mL | ORAL | Status: DC
  Administered 2021-12-06: 237 mL via ORAL
  Filled 2021-12-06 (×6): qty 237

## 2021-12-06 MED ORDER — LORAZEPAM 2 MG/ML IJ SOLN
0.5000 mg | Freq: Once | INTRAMUSCULAR | Status: AC | PRN
  Administered 2021-12-06: 0.5 mg via INTRAVENOUS
  Filled 2021-12-06: qty 1

## 2021-12-06 NOTE — Consult Note (Signed)
Reason for Consult: Diabetic gastroparesis Referring Physician: [facility]  Shauna Hugh HPI: This is a 42 year old female with a PMH of type I DM, diabetic gastroparesis, and chronic nausea and vomiting admitted again for nausea, vomiting, and bloating.  Her symptoms are essentially the same as her recent admission a couple of weeks ago.  She was recently evaluated by [facility] as an unassigned patient on 09/24/2021.  At that time she underwent a Savary dilation for complaints of dysphagia.  There was a stricture noted with an esophagram and Dr. Watt Climes dilated her esophagus with a 16 mm Savary.  She denied any significant change.  The patient was not able to provide a detailed history as she was somnolent and weak appearing.  She recalls being treated with metoclopramide years ago.  She typically sees [place] for her current GI issues.  Past Medical History:  Diagnosis Date   Diabetes mellitus    Gastroparesis    Hypertension    Neuropathy     Past Surgical History:  Procedure Laterality Date   ESOPHAGOGASTRODUODENOSCOPY (EGD) WITH PROPOFOL N/A 09/24/2021   Procedure: ESOPHAGOGASTRODUODENOSCOPY (EGD) WITH PROPOFOL;  Surgeon: Clarene Essex, MD;  Location: [facility];  Service: Gastroenterology;  Laterality: N/A;   SAVORY DILATION N/A 09/24/2021   Procedure: SAVORY DILATION - 16 MM;  Surgeon: Clarene Essex, MD;  Location: [facility];  Service: Gastroenterology;  Laterality: N/A;    Family History  Problem Relation Age of Onset   Hypertension Mother    Dementia Mother    Heart attack Father    Hypertension Father    Heart disease Father    Hypertension Brother    Stroke Maternal Grandfather     Social History:  reports that she has quit smoking. Her smoking use included cigarettes. She smoked an average of .5 packs per day. She has never used smokeless tobacco. She reports that she does not drink alcohol and does not use drugs.  Allergies: No Known Allergies  Medications:  Scheduled:  Chlorhexidine Gluconate Cloth  6 each Topical Daily   dronabinol  2.5 mg Oral BID AC   enoxaparin (LOVENOX) injection  40 mg Subcutaneous Q24H   feeding supplement (GLUCERNA SHAKE)  237 mL Oral Q24H   gabapentin  300 mg Oral TID   insulin aspart  0-9 Units Subcutaneous TID WC   insulin aspart  2 Units Subcutaneous TID WC   insulin glargine-yfgn  7 Units Subcutaneous Daily   metoCLOPramide (REGLAN) injection  10 mg Intravenous TID AC   mometasone-formoterol  2 puff Inhalation BID   montelukast  10 mg Oral QHS   multivitamin with minerals  1 tablet Oral Daily   pantoprazole (PROTONIX) IV  40 mg Intravenous Daily   Ensure Max Protein  11 oz Oral BID   Prucalopride Succinate  1 mg Oral Daily   rosuvastatin  20 mg Oral Daily   sodium chloride flush  10-40 mL Intracatheter Q12H   Continuous:  famotidine (PEPCID) IV Stopped (12/06/21 1055)   insulin Stopped (12/06/21 1414)    Results for orders placed or performed during the hospital encounter of 12/04/21 (from the past 24 hour(s))  Glucose, capillary     Status: Abnormal   Collection Time: 12/05/21  8:59 PM  Result Value Ref Range   Glucose-Capillary 101 (H) 70 - 99 mg/dL   Comment 1 Notify RN    Comment 2 Document in Chart   Glucose, capillary     Status: Abnormal   Collection  Time: 12/05/21  9:58 PM  Result Value Ref Range   Glucose-Capillary 109 (H) 70 - 99 mg/dL   Comment 1 Notify RN    Comment 2 Document in Chart   Magnesium     Status: Abnormal   Collection Time: 12/05/21 10:18 PM  Result Value Ref Range   Magnesium 1.5 (L) 1.7 - 2.4 mg/dL  Basic metabolic panel     Status: Abnormal   Collection Time: 12/05/21 10:18 PM  Result Value Ref Range   Sodium 137 135 - 145 mmol/L   Potassium 3.0 (L) 3.5 - 5.1 mmol/L   Chloride 111 98 - 111 mmol/L   CO2 18 (L) 22 - 32 mmol/L   Glucose, Bld 113 (H) 70 - 99 mg/dL   BUN <5 (L) 6 - 20 mg/dL   Creatinine, Ser 1.00 0.44 - 1.00 mg/dL   Calcium 7.7 (L) 8.9 - 10.3 mg/dL    GFR, Estimated >60 >60 mL/min   Anion gap 8 5 - 15  Glucose, capillary     Status: Abnormal   Collection Time: 12/05/21 11:00 PM  Result Value Ref Range   Glucose-Capillary 124 (H) 70 - 99 mg/dL   Comment 1 Notify RN    Comment 2 Document in Chart   Glucose, capillary     Status: Abnormal   Collection Time: 12/06/21 12:05 AM  Result Value Ref Range   Glucose-Capillary 147 (H) 70 - 99 mg/dL   Comment 1 Notify RN    Comment 2 Document in Chart   Glucose, capillary     Status: Abnormal   Collection Time: 12/06/21  1:08 AM  Result Value Ref Range   Glucose-Capillary 153 (H) 70 - 99 mg/dL  Glucose, capillary     Status: Abnormal   Collection Time: 12/06/21  1:59 AM  Result Value Ref Range   Glucose-Capillary 147 (H) 70 - 99 mg/dL   Comment 1 Notify RN    Comment 2 Document in Chart   Beta-hydroxybutyric acid     Status: Abnormal   Collection Time: 12/06/21  3:16 AM  Result Value Ref Range   Beta-Hydroxybutyric Acid 0.31 (H) 0.05 - 0.27 mmol/L  Basic metabolic panel     Status: Abnormal   Collection Time: 12/06/21  3:16 AM  Result Value Ref Range   Sodium 136 135 - 145 mmol/L   Potassium 3.3 (L) 3.5 - 5.1 mmol/L   Chloride 111 98 - 111 mmol/L   CO2 18 (L) 22 - 32 mmol/L   Glucose, Bld 144 (H) 70 - 99 mg/dL   BUN <5 (L) 6 - 20 mg/dL   Creatinine, Ser 0.93 0.44 - 1.00 mg/dL   Calcium 7.9 (L) 8.9 - 10.3 mg/dL   GFR, Estimated >60 >60 mL/min   Anion gap 7 5 - 15  Glucose, capillary     Status: Abnormal   Collection Time: 12/06/21  4:03 AM  Result Value Ref Range   Glucose-Capillary 147 (H) 70 - 99 mg/dL   Comment 1 Notify RN    Comment 2 Document in Chart   Glucose, capillary     Status: Abnormal   Collection Time: 12/06/21  6:11 AM  Result Value Ref Range   Glucose-Capillary 167 (H) 70 - 99 mg/dL  Basic metabolic panel     Status: Abnormal   Collection Time: 12/06/21  6:45 AM  Result Value Ref Range   Sodium 135 135 - 145 mmol/L   Potassium 3.4 (L) 3.5 - 5.1 mmol/L  Chloride 109 98 - 111 mmol/L   CO2 16 (L) 22 - 32 mmol/L   Glucose, Bld 178 (H) 70 - 99 mg/dL   BUN <5 (L) 6 - 20 mg/dL   Creatinine, Ser 0.84 0.44 - 1.00 mg/dL   Calcium 8.0 (L) 8.9 - 10.3 mg/dL   GFR, Estimated >60 >60 mL/min   Anion gap 10 5 - 15  Glucose, capillary     Status: Abnormal   Collection Time: 12/06/21  8:11 AM  Result Value Ref Range   Glucose-Capillary 204 (H) 70 - 99 mg/dL  Glucose, capillary     Status: Abnormal   Collection Time: 12/06/21  9:43 AM  Result Value Ref Range   Glucose-Capillary 176 (H) 70 - 99 mg/dL  Glucose, capillary     Status: Abnormal   Collection Time: 12/06/21 10:49 AM  Result Value Ref Range   Glucose-Capillary 189 (H) 70 - 99 mg/dL  Glucose, capillary     Status: Abnormal   Collection Time: 12/06/21 12:03 PM  Result Value Ref Range   Glucose-Capillary 240 (H) 70 - 99 mg/dL  Glucose, capillary     Status: Abnormal   Collection Time: 12/06/21  1:19 PM  Result Value Ref Range   Glucose-Capillary 249 (H) 70 - 99 mg/dL   Comment 1 Notify RN    Comment 2 Document in Chart   Glucose, capillary     Status: Abnormal   Collection Time: 12/06/21  3:13 PM  Result Value Ref Range   Glucose-Capillary 238 (H) 70 - 99 mg/dL     DG Abd Portable 1V  Result Date: 12/06/2021 CLINICAL DATA:  Abdominal pain. EXAM: PORTABLE ABDOMEN - 1 VIEW COMPARISON:  12/04/2021 FINDINGS: No evidence of bowel obstruction or significant ileus. No abnormal calcifications. Stable appearance of IUD. Visualized bony structures are unremarkable. IMPRESSION: No acute findings. Electronically Signed   By: Aletta Edouard M.D.   On: 12/06/2021 15:45    ROS:  As stated above in the HPI otherwise negative.  Blood pressure (!) 89/53, pulse (!) 104, temperature 98.8 F (37.1 C), temperature source Oral, resp. rate 20, weight 81.3 kg, SpO2 95 %.    PE: Gen: Somnolent, soft spoken, weak appearing Lungs: CTA Bilaterally CV: RRR without M/G/R ABD: Soft, obese, NTND, +BS Ext: No  C/C/E  Assessment/Plan: 1) Diabetic gastroparesis. 2) Chronic nausea and vomiting. 3) Bloating.   This is a very difficult case.  Even though she wants to establish care in [place], patients such as her are typically referred to a tertiary care center.  She will be tried on metoclopramide and treated symptomatically for her nausea.  She states that she has not eaten in two months, but she is obese upon examination.  It is difficult to know if she lost any weight.  Her KUB today was negative for any obstruction or ileus.  Plan: 1) Agree with the metoclopramide. 2) Antiemetics. 3) IV hydration.  Genie Mirabal D 12/06/2021, 8:02 PM

## 2021-12-06 NOTE — Progress Notes (Addendum)
    OVERNIGHT PROGRESS REPORT  Notified for hypotension. Patient is alert and oriented.  IVF bolus ordered, is responding as has previously.  IVF maintenance ordered due to reduced intake and other dehydrating diagnoses as outlined by GI and attending.   Gershon Cull MSNA MSN ACNPC-AG Acute Care Nurse Practitioner [place]

## 2021-12-06 NOTE — Progress Notes (Signed)
Initial Nutrition Assessment  DOCUMENTATION CODES:   Not applicable  INTERVENTION:  - will order Ensure Max BID, each supplement provides 150 kcal and 30 grams of protein.  - will order Glucerna Shake once/day, each supplement provides 220 kcal and 10 grams of protein.  - will order 1 tablet multivitamin with minerals/day.  - weigh patient today.  - added Gastroparesis Nutrition Therapy handout to the AVS.  - complete NFPE when feasible.   NUTRITION DIAGNOSIS:   Inadequate oral intake related to acute illness, chronic illness, nausea as evidenced by per patient/family report.  GOAL:   Patient will meet greater than or equal to 90% of their needs  MONITOR:   PO intake, Supplement acceptance, Diet advancement, Labs, Weight trends  REASON FOR ASSESSMENT:   Malnutrition Screening Tool  ASSESSMENT:   41 year old woman medical history of type 1 DM, diabetic gastroparesis, frequent and recurrent refractory N/V and metabolic derangement (admitted 7/30-8/1 for the same), HTN, and neuropathy. She presented to the ED due to N/V and abdominal pain. Patient required IV fluids and anti-emetics on admission.  Patient laying in bed with no visitors present at bedside at the time of RD visit. Patient flutters eyes and grimaces at times throughout RD visit so visit on the briefer side.  Patient shares that she has had ongoing cramp-like abdominal pain with associated nausea and difficulty taking in things PO. She is unable to recall the last time she had any solid foods, even soup. She was drinking water, gatorade, liquid IV, and protein shakes such as Atkin[employer] shakes at home. At times nausea was so severe that she could only consume water.  Patient had a prokinetic ordered for home use but she shares that it was not beneficial.   She has not been weighed since 7/30 when she weighed 170 lb and weight on 6/5 was 175 lb.   Labs reviewed; CBGs: 147-240 mg/dl, K: 3.4 mmol/l, BUN: <5 mg/dl,  Ca: 8 mg/dl.  Medications reviewed; 2.5 mg marinol BID, 40 mg IV pepcid BID, sliding scale novolog, 2 units novolog TID, 7 mg semglee/day, 2 g IV Mg sulfate x1 run 8/17, 10 mg IV reglan TID, 40 mg IV protonix/day, 10 mEq IV KCl x6 runs 8/16, 20 mEq Klor-Con x1 dose 8/17.    NUTRITION - FOCUSED PHYSICAL EXAM:  Deferred for patient's comfort today.   Diet Order:   Diet Order             Diet full liquid Room service appropriate? Yes; Fluid consistency: Thin  Diet effective now                   EDUCATION NEEDS:   Education needs have been addressed  Skin:  Skin Assessment: Reviewed RN Assessment  Last BM:  PTA/unknown  Height:   Ht Readings from Last 1 Encounters:  11/18/21 '5\' 4"'$  (1.626 m)    Weight:   Wt Readings from Last 1 Encounters:  11/18/21 77.1 kg    Ideal Body Weight:  54.54 kg  BMI:  There is no height or weight on file to calculate BMI.  Estimated Nutritional Needs:  Kcal:  1800-2000 kcal Protein:  90-100 grams Fluid:  >/= 2 L/day      Jarome Matin, MS, RD, LDN, CNSC Registered Dietitian II Inpatient Clinical Nutrition RD pager # and on-call/weekend pager # available in [facility]

## 2021-12-06 NOTE — Progress Notes (Signed)
Progress Note   Patient: Anna Garcia KGM:[id number] DOB: 1980/07/05 DOA: 12/04/2021     1 DOS: the patient was seen and examined on 12/06/2021   Brief hospital course: 41 year old woman PMH diabetes mellitus type 1 with diabetic gastroparesis, frequent and recurrent refractory nausea and vomiting and metabolic derangement, recently hospitalized for same, presented with nausea and vomiting.  Admitted for nausea, vomiting and abdominal pain with plan for IV hydration, antiemetics.    Admitted July 30 through August 1 after presenting with intractable nausea and vomiting.  Discharge diagnoses intractable nausea and vomiting, anion gap metabolic acidosis, comorbidities include diabetes mellitus type 1, gastroparesis.  Discharged on long-acting insulin and carb coverage.  --8/17 improved, DKA resolved, transition to subcutaneous insulin, transfer out of stepdown.  GI consultation.  Assessment and Plan: Severe DKA with anion gap metabolic acidosis, pH 6.44, CO2 undetectable, complicated by associated acute kidney injury. -- DKA now resolved with normalization of anion gap.  Last beta hydroxybutyric acid was near normal.  Blood sugars stable.  Chronic low serum bicarbonate. --Transition to subcutaneous insulin, full liquid diet.   DM type 1 w/ diabetic gastroparesis -- [facility] GE study 2015 showed: Decreased 2-hour gastric retention. Study was terminated early as the patient vomited. -- Currently no nausea or vomiting.  Reports abdominal pain over the last month which is vague in nature. --Small frequent meals, antiemetics as needed.  Last seen by GI in [place] in 2020.  She would like to transfer care to her [place].  Dr. Benson Norway consulted for further recommendations in regard to gastroparesis, recurrent admissions for intractable nausea and vomiting. --Had an EGD June of this year showing benign esophageal stenosis which was dilated.   Acute kidney injury with baseline creatinine 0.96.  Secondary to  DKA. -- Resolved.  Secondary to DKA.  Hold losartan, Bactrim.   Macrocytic anemia, longstanding.  B12 within normal limits in June. -- Hemoglobin stable   Questionable small bowel wall thickening in the left abdomen, which could reflect enteritis. No obstruction. -- Monitor clinically.  Does not appear to be clinically significant.   Chronic pain syndrome and fibromyalgia, managed by [facility] pain management with gabapentin.      Subjective:  Feels better Mild abdominal pain that moves around, present for a month or so, minimal oral intake at home she reports. No vomiting here.  Ready to try liquids. Has not seen GI in years she reports.  Physical Exam: Vitals:   12/06/21 0700 12/06/21 0701 12/06/21 0800 12/06/21 0900  BP: 105/61 105/61    Pulse: (!) 106 (!) 107  (!) 102  Resp: (!) 21 (!) 23 (!) 21 20  Temp:   98.1 F (36.7 C)   TempSrc:   Oral   SpO2: 98% 98%  99%   Physical Exam Vitals reviewed.  Constitutional:      General: She is not in acute distress.    Appearance: She is not ill-appearing or toxic-appearing.     Comments: Appears better today  Cardiovascular:     Rate and Rhythm: Normal rate and regular rhythm.     Heart sounds: No murmur heard. Pulmonary:     Effort: Pulmonary effort is normal. No respiratory distress.     Breath sounds: No wheezing, rhonchi or rales.  Abdominal:     General: There is distension.     Palpations: Abdomen is soft.     Tenderness: There is no abdominal tenderness. There is no guarding.  Musculoskeletal:     Right lower leg:  Edema present.     Left lower leg: Edema present.  Neurological:     Mental Status: She is alert.  Psychiatric:        Mood and Affect: Mood normal.        Behavior: Behavior normal.     Data Reviewed:  UOP 1880 CBG <250 AG 10 CO16 (chronically low) K+ 3.4, replete Last BHydroxbutyrate near normal  Family Communication: none  Disposition: Status is: Inpatient Remains inpatient  appropriate because: s/p DKA, transitioning to SQ  Planned Discharge Destination: Home    Time spent: 35 minutes  Author: Murray Hodgkins, MD 12/06/2021 10:06 AM  For on call review [email or web address].

## 2021-12-06 NOTE — Discharge Instructions (Signed)
Gastroparesis Nutrition Therapy  Gastroparesis means that your stomach empties very slowly. This happens when the nerves to your stomach are damaged or do not work properly. This can cause bloating, stomach discomfort or pain, feeling full after eating only a small amount of food, nausea, or vomiting. If you have diabetes in addition to gastroparesis, it is important to control your blood glucose. This will help the stomach empty.  Tips Following these tips may help your stomach empty faster:  Eat small, frequent meals (4 to 6 times per day).  Do not eat solid foods that are high in fat and do not add too much fat to foods. See the Foods Not Recommended table for foods that are high fat.  High-fat solid foods may delay the emptying of your stomach.  Liquids that contain fat, such as milkshakes, may be tolerated and can provide needed calories. Do not eat foods high in fiber. Do not take fiber supplements or fiber bulking agents for constipation.  Do not eat foods that increase acid reflux:  Acidic, spicy, fried and greasy foods  Caffeine  Mint Do not drink alcohol or smoke  Do not drink carbonated beverages, as they increase bloating.  Chew foods well before swallowing. Solid foods in the stomach do not empty well. If you have difficulty tolerating solid foods, ground foods may be better.  If symptoms are severe, semi-solid foods or liquids may need to be your main food sources. Choose liquid nutritional supplements that have less than or equal to 2 grams fiber per serving.  Sit upright while eating and sit upright or walk after meals. Do not lie down for 3 to 4 hours after eating to avoid reflux or regurgitation.  If you wish to nap during the day, nap first and then eat.  Drinking fluids at meals can take up room in your stomach, and you might not get enough calories. At every meal, first eat a grain food and a protein food or dairy product if your body can tolerate it. Drink fluids with  calories. It may be better to delay fluids until after the meal and drink more between meals.   Foods Recommended Food Group Foods Recommended   Grains Choose grain foods with less than 2 grams of fiber per serving; these will be made with white flour Crackers: saltines or graham crackers Cold cereal: puffed rice Cream of rice or wheat Grits (fine ground) Gluten free low fiber foods Pretzels White bread, toasted White rice, cook until very soft  Protein Foods Lean meat and poultry: well-cooked, very tender, moist, and chopped fine Fish: tuna, salmon, or white fish Egg whites, scrambled Peanut butter (limit to 1 tablespoon at a time)  Dairy Milk*, drink 2% if tolerated to get more nutrients or lactose-free 2% milk Fortified non-dairy milks: almond, cashew, coconut, or rice (be aware that these options are not good sources of protein so you will need to eat an additional protein food) Fortified pea milk or soymilk (may cause gas and bloating for some) Instant breakfast* (pre-made lactose-free is sold in bottles) Milkshakes* (try blending in  to  cup canned fruit) Ice cream* (low-fat may be tolerated better; use in milkshakes to increase calories) Frozen yogurt Yogurt* Puddings and custard* Sherbet Liquid nutritional supplements with less than or equal to 2 grams fiber per 1 cup serving *Use lactose-free varieties to reduce gas and bloating  Vegetables Canned and well-cooked vegetables without seeds, skins or hulls Carrots, cooked Mashed potatoes (white, red or yellow) Sweet   potato  Fruit Canned, soft and well-cooked fruits without seeds, skins or membranes Applesauce Banana, mashed may be tolerated better Diced peaches/pears fruit cups in juice Melon, very soft, cut into small pieces Fruit nectar juices  Oils When possible choose oils rather than solid fats Canola or olive oil Margarine  Other Clear soup Gelatin Popsicles   Foods Not Recommended Food Group Foods Not  Recommended   Grains Bran Grains foods with 2 or more grams of fiber per serving: barley, brown rice, kasha, quinoa Popcorn Whole grain and high-fiber cereals, including oats or granola Whole grain bread or pasta  Protein Foods Fried meats, poultry or fish Sausage, bacon or hot dogs Seafood Tough meat, meat with gristle: steak, roast beef or pork chops Beans, peas or lentils Nuts  Dairy Cheese slices Liquid nutritional supplements that have more than 2 grams fiber per serving Pea milk, soymilk (may increase gas and bloating)  Vegetables Raw or undercooked vegetables Alfalfa, asparagus, bean sprouts, broccoli, brussels sprouts, cabbage, cauliflower, corn, green peas or any other kind of peas, lima beans, mushrooms, okra, onions, parsnips, peppers, pickles, potato skins, or spinach  Fruit Fresh fruit except for the ones in the foods recommended table Acidic fruit and juices: oranges/orange juice, grapefruit/grapefruit juice, tomatoes/tomato juice Avocado Berries Coconut Dried fruit Fruit skin Mandarin oranges Pineapple  Oils Fried foods of any type  Other Coffee Olives or pickles Pizza Salsa Sushi   Gastroparesis Sample 1-Day Menu  Breakfast 1 slice white toast (1 carbohydrate serving)  1 teaspoon margarine, soft, tub   cup egg substitute  1 cup peach nectar (2 carbohydrate servings)  Morning Snack Smoothie made with:  small banana (1 carbohydrate serving)  1/3 cup Greek strawberry yogurt ( carbohydrate serving)  1 cup 2% milk (1 carbohydrate serving)  Lunch 2 ounces canned chicken  1 teaspoon mayonnaise  9 saltine crackers (1 carbohydrate servings)   cup applesauce (1 carbohydrate serving)  Afternoon Snack 1 slice white toast (1 carbohydrate serving)  1 tablespoon smooth peanut butter  Evening Meal 2 ounces baked fish   cup mashed potatoes (1 carbohydrate serving)  1 teaspoon olive oil  1 cup 2% milk (1 carbohydrate serving)  Evening Snack 1 packet instant  breakfast (1 carbohydrate servings)  1 cup 2% milk (1 carbohydrate serving)   Gastroparesis Vegetarian (Lacto-Ovo) Sample 1-Day Menu  Breakfast  cup cooked farina (1 carbohydrate serving)   cup egg substitute  2 teaspoons olive oil   cup peach nectar (2 carbohydrate servings)   cup 2% milk ( carbohydrate serving)  Morning Snack 1 slice white toast (1 carbohydrate serving)  1 tablespoon smooth peanut butter  Lunch  cup vegetable soup (1 carbohydrate serving)  9 saltine crackers (1 carbohydrate serving)   cup applesauce (1 carbohydrate serving)   cup 2% milk ( carbohydrate serving)  Afternoon Snack 6 ounces plain yogurt (1 carbohydrate serving)   small banana (1 carbohydrate servings)  Evening Meal  cup baked tofu  2/3 cup white rice (2 carbohydrate servings)  2 teaspoons olive oil   cup 2% milk ( carbohydrate serving)  Evening Snack 1 packet instant breakfast (1 carbohydrate servings)  1 cup 2% milk (1 carbohydrate serving)   Gastroparesis Vegan Sample 1-Day Menu  Breakfast  cup cooked farina (1 carbohydrate serving)  1/3 cup tofu scramble  2 teaspoons olive oil   cup peach nectar (2 carbohydrate servings)   cup almond milk fortified with calcium, vitamin B12, and vitamin D  Morning Snack 1 slice white toast (1   carbohydrate serving)  1 tablespoon smooth peanut butter  Lunch  cup vegetable soup (1 carbohydrate serving)  9 saltine crackers (1 carbohydrate serving)   cup applesauce (1 carbohydrate serving)  Afternoon Snack 6 ounces plain soy yogurt (1 carbohydrate servings)   small banana (1 carbohydrate serving)  Evening Meal  cup baked tofu  2/3 cup white rice (2 carbohydrate servings)  2 teaspoons olive oil   cup almond milk fortified with calcium, vitamin B12, and vitamin D  Evening Snack  scoop soy protein powder ( carbohydrate serving)   cup almond milk fortified with calcium, vitamin B12, and vitamin D   Copyright 2020  [employer]  [employer]. All rights reserved.  

## 2021-12-06 NOTE — Progress Notes (Signed)
Was the fall witnessed: Yes  Patient condition before and after the fall:Stable   Patient's reaction to the fall:Embarrassed   Name of the doctor that was notified including date and time: Dr. Sarajane Jews Any interventions and vital signs: Staff will be at bedside for assistance with transferring and ambulation. VSS see flow sheet

## 2021-12-07 DIAGNOSIS — E1343 Other specified diabetes mellitus with diabetic autonomic (poly)neuropathy: Secondary | ICD-10-CM | POA: Diagnosis not present

## 2021-12-07 DIAGNOSIS — E1065 Type 1 diabetes mellitus with hyperglycemia: Secondary | ICD-10-CM | POA: Diagnosis not present

## 2021-12-07 LAB — BASIC METABOLIC PANEL
Anion gap: 6 (ref 5–15)
BUN: <5 mg/dL — ABNORMAL LOW (ref 6–20)
CO2: 16 mmol/L — ABNORMAL LOW (ref 22–32)
Calcium: 7.6 mg/dL — ABNORMAL LOW (ref 8.9–10.3)
Chloride: 110 mmol/L (ref 98–111)
Creatinine, Ser: 0.91 mg/dL (ref 0.44–1.00)
GFR, Estimated: >60 mL/min (ref >60)
Glucose, Bld: 333 mg/dL — ABNORMAL HIGH (ref 70–99)
Potassium: 3.7 mmol/L (ref 3.5–5.1)
Sodium: 132 mmol/L — ABNORMAL LOW (ref 135–145)

## 2021-12-07 LAB — MAGNESIUM: Magnesium: 1.9 mg/dL (ref 1.7–2.4)

## 2021-12-07 LAB — GLUCOSE, CAPILLARY
Glucose-Capillary: 271 mg/dL — ABNORMAL HIGH (ref 70–99)
Glucose-Capillary: 243 mg/dL — ABNORMAL HIGH (ref 70–99)
Glucose-Capillary: 311 mg/dL — ABNORMAL HIGH (ref 70–99)
Glucose-Capillary: 157 mg/dL — ABNORMAL HIGH (ref 70–99)

## 2021-12-07 LAB — PHOSPHORUS
Phosphorus: <1 mg/dL — CL (ref 2.5–4.6)
Phosphorus: 1.4 mg/dL — ABNORMAL LOW (ref 2.5–4.6)

## 2021-12-07 LAB — CORTISOL: Cortisol, Plasma: 6.7 ug/dL

## 2021-12-07 MED ORDER — DEXTROSE 5 % IV SOLN
30.0000 mmol | Freq: Once | INTRAVENOUS | Status: AC
  Administered 2021-12-07: 30 mmol via INTRAVENOUS
  Filled 2021-12-07: qty 10

## 2021-12-07 MED ORDER — INSULIN ASPART 100 UNIT/ML IJ SOLN
3.0000 [IU] | Freq: Three times a day (TID) | INTRAMUSCULAR | Status: DC
  Administered 2021-12-07 – 2021-12-08 (×4): 3 [IU] via SUBCUTANEOUS

## 2021-12-07 MED ORDER — PANCRELIPASE (LIP-PROT-AMYL) 12000-38000 UNITS PO CPEP
36000.0000 [IU] | ORAL_CAPSULE | Freq: Three times a day (TID) | ORAL | Status: DC
Start: 2021-12-07 — End: 2021-12-11
  Administered 2021-12-07 – 2021-12-11 (×13): 36000 [IU] via ORAL
  Filled 2021-12-07 (×13): qty 3

## 2021-12-07 MED ORDER — PANTOPRAZOLE SODIUM 40 MG PO TBEC
40.0000 mg | DELAYED_RELEASE_TABLET | Freq: Every day | ORAL | Status: DC
  Administered 2021-12-07 – 2021-12-11 (×5): 40 mg via ORAL
  Filled 2021-12-07 (×5): qty 1

## 2021-12-07 MED ORDER — POTASSIUM PHOSPHATES 15 MMOLE/5ML IV SOLN
30.0000 mmol | Freq: Once | INTRAVENOUS | Status: AC
Start: 2021-12-07 — End: 2021-12-08
  Administered 2021-12-07: 30 mmol via INTRAVENOUS
  Filled 2021-12-07: qty 10

## 2021-12-07 NOTE — Progress Notes (Signed)
Progress Note   Patient: Anna Garcia YOV:[id number] DOB: [birth year] DOA: 12/04/2021     2 DOS: the patient was seen and examined on 12/07/2021   Brief hospital course: 41 year old woman PMH diabetes mellitus type 1 with diabetic gastroparesis, frequent and recurrent refractory nausea and vomiting and metabolic derangement, recently hospitalized for same, presented with nausea and vomiting.  Admitted for nausea, vomiting and abdominal pain with plan for IV hydration, antiemetics.    Admitted July 30 through August 1 after presenting with intractable nausea and vomiting.  Discharge diagnoses intractable nausea and vomiting, anion gap metabolic acidosis, comorbidities include diabetes mellitus type 1, gastroparesis.  Discharged on long-acting insulin and carb coverage.  --8/18 improved, tolerating minimal diet, poor oral intake, intermittent abdominal pain.  Assessment and Plan: Severe DKA with anion gap metabolic acidosis, pH 7.41, CO2 undetectable, complicated by associated AKI. -- DKA resolved.  DM type 1 w/ diabetic gastroparesis, nausea, intermittent abdominal pain -- [facility] GE study 2015 showed: Decreased 2-hour gastric retention. Study was terminated early as the patient vomited. --Small frequent meals, antiemetics as needed.  Creon added by Dr. Benson Norway.  Continue Reglan.  Keep follow-up with [place] to be a challenge with poor oral intake.   -- CBG high.  Continue Semglee, sliding scale insulin, increase meal coverage.  Intermittent mild hypotension --No apparent signs or symptoms of infection or severe illness --Check cortisol, rule out adrenal insufficiency  Severe hypophosphatemia -- Replete.  Repeat study this PM and in AM.  Bilateral lower extremity edema secondary to volume resuscitation. --Place TED hose.  Given soft blood pressures, avoid Lasix for now.   Acute kidney injury with baseline creatinine 0.96.  Secondary to DKA. -- Resolved.  Secondary to DKA.   Continue to hold losartan, Bactrim.  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 --Remove Foley catheter. In and out cath q6 and as needed.   Macrocytic anemia, longstanding.  B12 within normal limits in June. -- Hemoglobin stable   Questionable small bowel wall thickening in the left abdomen, which could reflect enteritis. No obstruction. -- Monitor clinically.  Does not appear to be clinically significant.   Chronic pain syndrome and fibromyalgia, managed by [facility] pain management with gabapentin.      Subjective:  Feels ok Tolerated grits Mild abd pain Has some LE edema Has h/o need for self-cath  Physical Exam: Vitals:   12/07/21 0948 12/07/21 1057 12/07/21 1359 12/07/21 1730  BP: 95/65 101/68 101/70 112/70  Pulse: (!) 102 100 (!) 109   Resp: '20 18 16 18  '$ Temp: 98.1 F (36.7 C) 98.5 F (36.9 C) 98.6 F (37 C)   TempSrc: Oral Oral Oral   SpO2: 99% 100% 99% 100%  Weight:      Height:       Physical Exam Vitals reviewed.  Cardiovascular:     Rate and Rhythm: Normal rate and regular rhythm.     Heart sounds: No murmur heard. Pulmonary:     Effort: Pulmonary effort is normal. No respiratory distress.     Breath sounds: No wheezing, rhonchi or rales.  Abdominal:     Palpations: Abdomen is soft.     Tenderness: There is no abdominal tenderness.  Musculoskeletal:     Right lower leg: Edema present.     Left lower leg: Edema present.  Neurological:     Mental Status: She is alert.  Psychiatric:        Mood and Affect: Mood normal.  Behavior: Behavior normal.     Data Reviewed:  Phos <1 AG WNL Mg 1.9 K+ 3.7  Family Communication: none   Disposition: Status is: Inpatient Remains inpatient appropriate because: poor diet intake, severe hypophosphatemia   Planned Discharge Destination: Home    Time spent: 35 minutes  Author: Murray Hodgkins, MD 12/07/2021 6:23 PM  For on call review [email or web address].

## 2021-12-07 NOTE — Inpatient Diabetes Management (Signed)
Inpatient Diabetes Program Recommendations  AACE/ADA: New Consensus Statement on Inpatient Glycemic Control (2015)  Target Ranges:  Prepandial:   less than 140 mg/dL      Peak postprandial:   less than 180 mg/dL (1-2 hours)      Critically ill patients:  140 - 180 mg/dL   Lab Results  Component Value Date   GLUCAP 243 (H) 12/07/2021   HGBA1C 7.6 (H) 11/18/2021    Review of Glycemic Control  Diabetes history: DM1 Outpatient Diabetes medications: Lantus 7 units QHS, Apidra 0-8 units TID Current orders for Inpatient glycemic control: Semglee 7 units QD, Novolog 0-9 units TID + 2 units TID  HgbA1C - 7.6%, down from 9.6% on 05/21/21  Gave basal insulin 1-2H prior to discontinuation of drip.  On FL diet HgbA1C - 7.6%  Inpatient Diabetes Program Recommendations:    Novolog 0-9 units TID with meals and 0-5 HS Increase Novolog to 3 units TID for meal coverage insulin  Spoke with pt at bedside regarding her diabetes and glucose control at home. States she's reduced her basal insulin per Endo, since waking up with lows. Uses Apidra for rapid-acting insulin. Uses s/s at meals, never over 7-8 units each meal.  Follow.  Thank you. Lorenda Peck, RD, LDN, [place] Inpatient Diabetes Coordinator [phone]

## 2021-12-07 NOTE — Progress Notes (Signed)
Subjective: No complaints.  Feeling a little better.  Objective: Vital signs in last 24 hours: Temp:  [98.1 F (36.7 C)-98.9 F (37.2 C)] 98.1 F (36.7 C) (08/18 0626) Pulse Rate:  [95-116] 100 (08/18 0626) Resp:  [14-27] 20 (08/18 0626) BP: (74-117)/(30-85) 93/63 (08/18 0626) SpO2:  [92 %-99 %] 97 % (08/18 0626) Weight:  [81.3 kg] 81.3 kg (08/17 1304) Last BM Date : 12/06/21  Intake/Output from previous day: 08/17 0701 - 08/18 0700 In: 1188.5 [I.V.:1082.1; IV Piggyback:106.4] Out: 800 [Urine:800] Intake/Output this shift: No intake/output data recorded.  General appearance: alert and no distress GI: soft, non-tender; bowel sounds normal; no masses,  no organomegaly  Lab Results: Recent Labs    12/04/21 1144 12/05/21 0222  WBC 7.2 11.3*  HGB 11.2* 10.4*  HCT 33.5* 34.5*  PLT 150 202   BMET Recent Labs    12/05/21 2218 12/06/21 0316 12/06/21 0645  NA 137 136 135  K 3.0* 3.3* 3.4*  CL 111 111 109  CO2 18* 18* 16*  GLUCOSE 113* 144* 178*  BUN <5* <5* <5*  CREATININE 1.00 0.93 0.84  CALCIUM 7.7* 7.9* 8.0*   LFT Recent Labs    12/04/21 1144  PROT 6.8  ALBUMIN 3.1*  AST 58*  ALT 38  ALKPHOS 176*  BILITOT 2.6*   PT/INR No results for input(s): "LABPROT", "INR" in the last 72 hours. Hepatitis Panel No results for input(s): "HEPBSAG", "HCVAB", "HEPAIGM", "HEPBIGM" in the last 72 hours. C-Diff No results for input(s): "CDIFFTOX" in the last 72 hours. Fecal Lactopherrin No results for input(s): "FECLLACTOFRN" in the last 72 hours.  Studies/Results: DG Abd Portable 1V  Result Date: 12/06/2021 CLINICAL DATA:  Abdominal pain. EXAM: PORTABLE ABDOMEN - 1 VIEW COMPARISON:  12/04/2021 FINDINGS: No evidence of bowel obstruction or significant ileus. No abnormal calcifications. Stable appearance of IUD. Visualized bony structures are unremarkable. IMPRESSION: No acute findings. Electronically Signed   By: Aletta Edouard M.D.   On: 12/06/2021 15:45     Medications: Scheduled:  Chlorhexidine Gluconate Cloth  6 each Topical Daily   dronabinol  2.5 mg Oral BID AC   enoxaparin (LOVENOX) injection  40 mg Subcutaneous Q24H   feeding supplement (GLUCERNA SHAKE)  237 mL Oral Q24H   gabapentin  300 mg Oral TID   insulin aspart  0-9 Units Subcutaneous TID WC   insulin aspart  2 Units Subcutaneous TID WC   insulin glargine-yfgn  7 Units Subcutaneous Daily   metoCLOPramide (REGLAN) injection  10 mg Intravenous TID AC   mometasone-formoterol  2 puff Inhalation BID   montelukast  10 mg Oral QHS   multivitamin with minerals  1 tablet Oral Daily   pantoprazole (PROTONIX) IV  40 mg Intravenous Daily   Ensure Max Protein  11 oz Oral BID   Prucalopride Succinate  1 mg Oral Daily   rosuvastatin  20 mg Oral Daily   sodium chloride flush  10-40 mL Intracatheter Q12H   Continuous:  sodium chloride 50 mL/hr at 12/07/21 0600   famotidine (PEPCID) IV Stopped (12/06/21 2305)    Assessment/Plan: 1) Diabetic gastroparesis. 2) Chronic nausea/vomiting. 3) Abdominal bloating/pain.   She was slowly eating grits this AM, which is a positive sign.  With her bloating/pain complaints, she may benefit from Creon.  Plan: 1) Creon. 2) Continue with metoclopramide and antiemetics. 3) Upon discharge, she will follow up with [facility].  She already has an appointment on 12/18/2021.  LOS: 2 days   Corley Kohls D 12/07/2021, 8:07 AM

## 2021-12-07 NOTE — Plan of Care (Signed)
Problem: Education: Goal: Knowledge of General Education information will improve Description: Including pain rating scale, medication(s)/side effects and non-pharmacologic comfort measures Outcome: Progressing   Problem: Clinical Measurements: Goal: Ability to maintain clinical measurements within normal limits will improve Outcome: Progressing   Problem: Activity: Goal: Risk for activity intolerance will decrease Outcome: Progressing   Problem: Pain Managment: Goal: General experience of comfort will improve Outcome: Progressing   Ivan Anchors, RN. 12/07/21 9:25 AM

## 2021-12-08 DIAGNOSIS — G894 Chronic pain syndrome: Secondary | ICD-10-CM | POA: Diagnosis not present

## 2021-12-08 DIAGNOSIS — E1143 Type 2 diabetes mellitus with diabetic autonomic (poly)neuropathy: Secondary | ICD-10-CM

## 2021-12-08 DIAGNOSIS — K3184 Gastroparesis: Secondary | ICD-10-CM

## 2021-12-08 DIAGNOSIS — E1343 Other specified diabetes mellitus with diabetic autonomic (poly)neuropathy: Secondary | ICD-10-CM | POA: Diagnosis not present

## 2021-12-08 DIAGNOSIS — R112 Nausea with vomiting, unspecified: Secondary | ICD-10-CM | POA: Diagnosis not present

## 2021-12-08 DIAGNOSIS — R140 Abdominal distension (gaseous): Secondary | ICD-10-CM

## 2021-12-08 DIAGNOSIS — E0810 Diabetes mellitus due to underlying condition with ketoacidosis without coma: Secondary | ICD-10-CM | POA: Diagnosis not present

## 2021-12-08 LAB — BASIC METABOLIC PANEL
Anion gap: 8 (ref 5–15)
BUN: <5 mg/dL — ABNORMAL LOW (ref 6–20)
CO2: 16 mmol/L — ABNORMAL LOW (ref 22–32)
Calcium: 8.1 mg/dL — ABNORMAL LOW (ref 8.9–10.3)
Chloride: 110 mmol/L (ref 98–111)
Creatinine, Ser: 0.89 mg/dL (ref 0.44–1.00)
GFR, Estimated: >60 mL/min (ref >60)
Glucose, Bld: 377 mg/dL — ABNORMAL HIGH (ref 70–99)
Potassium: 4.1 mmol/L (ref 3.5–5.1)
Sodium: 134 mmol/L — ABNORMAL LOW (ref 135–145)

## 2021-12-08 LAB — GLUCOSE, CAPILLARY
Glucose-Capillary: 286 mg/dL — ABNORMAL HIGH (ref 70–99)
Glucose-Capillary: 381 mg/dL — ABNORMAL HIGH (ref 70–99)
Glucose-Capillary: 322 mg/dL — ABNORMAL HIGH (ref 70–99)
Glucose-Capillary: 237 mg/dL — ABNORMAL HIGH (ref 70–99)

## 2021-12-08 LAB — MAGNESIUM: Magnesium: 1.8 mg/dL (ref 1.7–2.4)

## 2021-12-08 LAB — PHOSPHORUS
Phosphorus: 1.7 mg/dL — ABNORMAL LOW (ref 2.5–4.6)
Phosphorus: 3.2 mg/dL (ref 2.5–4.6)

## 2021-12-08 MED ORDER — FUROSEMIDE 10 MG/ML IJ SOLN
20.0000 mg | Freq: Once | INTRAMUSCULAR | Status: AC
  Administered 2021-12-08: 20 mg via INTRAVENOUS
  Filled 2021-12-08: qty 2

## 2021-12-08 MED ORDER — POTASSIUM PHOSPHATES 15 MMOLE/5ML IV SOLN
30.0000 mmol | Freq: Once | INTRAVENOUS | Status: AC
  Administered 2021-12-08: 30 mmol via INTRAVENOUS
  Filled 2021-12-08: qty 10

## 2021-12-08 MED ORDER — INSULIN ASPART 100 UNIT/ML IJ SOLN
4.0000 [IU] | Freq: Three times a day (TID) | INTRAMUSCULAR | Status: DC
  Administered 2021-12-09 – 2021-12-11 (×8): 4 [IU] via SUBCUTANEOUS

## 2021-12-08 MED ORDER — DICYCLOMINE HCL 10 MG PO CAPS
10.0000 mg | ORAL_CAPSULE | Freq: Three times a day (TID) | ORAL | Status: DC
  Administered 2021-12-08 – 2021-12-11 (×12): 10 mg via ORAL
  Filled 2021-12-08 (×12): qty 1

## 2021-12-08 MED ORDER — INSULIN GLARGINE-YFGN 100 UNIT/ML ~~LOC~~ SOLN
8.0000 [IU] | Freq: Every day | SUBCUTANEOUS | Status: DC
Start: 2021-12-09 — End: 2021-12-09
  Filled 2021-12-08: qty 0.08

## 2021-12-08 NOTE — Plan of Care (Signed)
Problem: Education: Goal: Knowledge of General Education information will improve Description: Including pain rating scale, medication(s)/side effects and non-pharmacologic comfort measures Outcome: Progressing   Problem: Clinical Measurements: Goal: Ability to maintain clinical measurements within normal limits will improve Outcome: Progressing   Problem: Activity: Goal: Risk for activity intolerance will decrease Outcome: Progressing   Ivan Anchors, RN. 12/08/21 10:50 AM

## 2021-12-08 NOTE — Progress Notes (Addendum)
[facility] Progress Note (Weekend cross coverage for Anna Garcia)  Chief Complaint: Nausea and vomiting, diabetic gastropathy  History:  Anna Garcia reports feeling a little better today.  She is able to keep down small portions of a full liquid diet, which also apparently includes grits. She continues to have diffuse abdominal bloating that is uncomfortable. Anna Garcia consult note was reviewed, and Anna Garcia also indicates that it has been years since she has seen the Anna Garcia in person.  They have apparently continued to prescribe her prucalopride.  Since that is not available on the hospital side, it is substituted for metoclopramide.  Hemoglobin A1c 7.6 on admission.  She reports often having great fluctuations of her glucose from the 20s to over 300.  She also receives endocrinology care at [facility].  ROS: Cardiovascular: Denies chest pain Respiratory: Denies dyspnea Urinary: Denies dysuria  Objective:   Current Facility-Administered Medications:    acetaminophen (TYLENOL) tablet 650 mg, 650 mg, Oral, Q6H PRN, 650 mg at 12/07/21 1338 **OR** acetaminophen (TYLENOL) suppository 650 mg, 650 mg, Rectal, Q6H PRN, Anna Cota, MD   Chlorhexidine Gluconate Cloth 2 % PADS 6 each, 6 each, Topical, Daily, Anna Cota, MD, 6 each at 12/08/21 0920   dextrose 50 % solution 0-50 mL, 0-50 mL, Intravenous, PRN, Anna Cota, MD   dronabinol (MARINOL) capsule 2.5 mg, 2.5 mg, Oral, BID AC, Anna Cota, MD, 2.5 mg at 12/07/21 1700   enoxaparin (LOVENOX) injection 40 mg, 40 mg, Subcutaneous, Q24H, Anna Cota, MD, 40 mg at 12/07/21 2142   feeding supplement (GLUCERNA SHAKE) (GLUCERNA SHAKE) liquid 237 mL, 237 mL, Oral, Q24H, Anna Cota, MD, 237 mL at 12/06/21 1613   gabapentin (NEURONTIN) capsule 300 mg, 300 mg, Oral, TID, Anna Cota, MD, 300 mg at 12/08/21 0836   insulin aspart (novoLOG) injection 0-9 Units, 0-9 Units, Subcutaneous, TID WC, Anna Cota, MD, 5 Units at 12/08/21 0837   insulin aspart (novoLOG) injection 3 Units, 3 Units, Subcutaneous, TID WC, Anna Cota, MD, 3 Units at 12/08/21 0837   insulin glargine-yfgn (SEMGLEE) injection 7 Units, 7 Units, Subcutaneous, Daily, Anna Cota, MD, 7 Units at 12/07/21 1438   lipase/protease/amylase (CREON) capsule 36,000 Units, 36,000 Units, Oral, TID WC, Anna Ada, MD, 36,000 Units at 12/08/21 0835   metoCLOPramide (REGLAN) injection 10 mg, 10 mg, Intravenous, TID AC, Anna Cota, MD, 10 mg at 12/08/21 0836   mometasone-formoterol (DULERA) 100-5 MCG/ACT inhaler 2 puff, 2 puff, Inhalation, BID, Anna Cota, MD, 2 puff at 12/08/21 0741   montelukast (SINGULAIR) tablet 10 mg, 10 mg, Oral, QHS, Anna Cota, MD, 10 mg at 12/07/21 2143   multivitamin with minerals tablet 1 tablet, 1 tablet, Oral, Daily, Anna Cota, MD, 1 tablet at 12/08/21 0836   ondansetron (ZOFRAN-ODT) disintegrating tablet 8 mg, 8 mg, Oral, Q8H PRN, Anna Cota, MD, 8 mg at 12/07/21 1333   pantoprazole (PROTONIX) EC tablet 40 mg, 40 mg, Oral, Daily, Anna Ada, MD, 40 mg at 12/08/21 0836   protein supplement (ENSURE MAX) liquid, 11 oz, Oral, BID, Anna Cota, MD, 11 oz at 12/08/21 1610   Prucalopride Succinate TABS 1 mg, 1 mg, Oral, Daily, Anna Cota, MD   rosuvastatin (CRESTOR) tablet 20 mg, 20 mg, Oral, Daily, Anna Cota, MD, 20 mg at 12/08/21 0836   simethicone (MYLICON) chewable tablet 80 mg, 80 mg, Oral, Q6H PRN, Anna Cota, MD, 80 mg  at 12/07/21 2143   sodium chloride flush (NS) 0.9 % injection 10-40 mL, 10-40 mL, Intracatheter, Q12H, Anna Cota, MD, 10 mL at 12/08/21 0838   sodium chloride flush (NS) 0.9 % injection 10-40 mL, 10-40 mL, Intracatheter, PRN, Anna Cota, MD     Vital signs in last 24 hrs: Vitals:   12/08/21 0709 12/08/21 0932  BP:  106/67  Pulse:  (!) 108  Resp:  17  Temp:  98.5 F (36.9 C)   SpO2: 98% 98%    Intake/Output Summary (Last 24 hours) at 12/08/2021 1017 Last data filed at 12/08/2021 1001 Gross per 24 hour  Intake 1322.93 ml  Output 1700 ml  Net -377.07 ml     Physical Exam Fatigued, sitting up in bed, alert and conversational, oriented. HEENT: sclera anicteric, oral mucosa without lesions Neck: supple, no thyromegaly, JVD or lymphadenopathy Cardiac: RRR without murmurs, S1S2 heard, no peripheral edema Pulm: clear to auscultation bilaterally, normal RR and effort noted Abdomen: soft, mild upper tenderness, with active bowel sounds. No guarding or palpable hepatosplenomegaly.  Not tympanitic Skin; warm and dry, no jaundice  Recent Labs:     Latest Ref Rng & Units 12/05/2021    2:22 AM 12/04/2021   11:44 AM 11/19/2021    5:00 AM  CBC  WBC 4.0 - 10.5 K/uL 11.3  7.2  6.8   Hemoglobin 12.0 - 15.0 g/dL 10.4  11.2  10.3   Hematocrit 36.0 - 46.0 % 34.5  33.5  30.9   Platelets 150 - 400 K/uL 202  150  82     No results for input(s): "INR" in the last 168 hours.    Latest Ref Rng & Units 12/07/2021    8:30 AM 12/06/2021    6:45 AM 12/06/2021    3:16 AM  CMP  Glucose 70 - 99 mg/dL 333  178  144   BUN 6 - 20 mg/dL <5  <5  <5   Creatinine 0.44 - 1.00 mg/dL 0.91  0.84  0.93   Sodium 135 - 145 mmol/L 132  135  136   Potassium 3.5 - 5.1 mmol/L 3.7  3.4  3.3   Chloride 98 - 111 mmol/L 110  109  111   CO2 22 - 32 mmol/L '16  16  18   '$ Calcium 8.9 - 10.3 mg/dL 7.6  8.0  7.9    No morning labs today.  As of yesterday, her severe admission acidemia was improving.  Radiologic studies: No new abdominal imaging to review  Assessment & Plan  Assessment: Intractable nausea and vomiting related to diabetic gastropathy Suboptimally controlled diabetes -hemoglobin A1c 7.6, but more bothersome are her great fluctuations in glucose.  Probable protein calorie malnutrition with BUN <5 -prealbumin ordered for tomorrow morning.  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  She does not have chronic diarrhea, though testing or empiric treatment for SIBO should be considered by her primary GI team.  Continue current supportive management. Advance diet slowly as tolerated to low residue Optimal glucose control is essential Continue current dosing of metoclopramide and as needed ondansetron.  She has an upcoming appointment with the [facility] practice.  She will be seen again as needed over the weekend, and Anna Garcia will return Monday.  Call sooner if urgent issues arise.  Nelida Meuse III Office: [phone]

## 2021-12-08 NOTE — Progress Notes (Signed)
Progress Note   Patient: Anna Garcia CHE:[id number] DOB: 11-26-1980 DOA: 12/04/2021     3 DOS: the patient was seen and examined on 12/08/2021   Brief hospital course: 41 year old woman PMH diabetes mellitus type 1 with diabetic gastroparesis, frequent and recurrent refractory nausea and vomiting and metabolic derangement, recently hospitalized for same, presented with nausea and vomiting.  Admitted for nausea, vomiting and abdominal pain with plan for IV hydration, antiemetics.    Admitted July 30 through August 1 after presenting with intractable nausea and vomiting.  Discharge diagnoses intractable nausea and vomiting, anion gap metabolic acidosis, comorbidities include diabetes mellitus type 1, gastroparesis.  Discharged on long-acting insulin and carb coverage.  --8/19 no change from yesterday; still abdominal pain, leery of advancing diet  Assessment and Plan: Severe DKA with anion gap metabolic acidosis, pH 5.36, CO2 undetectable, complicated by associated AKI. -- DKA resolved.   DM type 1 w/ diabetic gastroparesis, nausea, intermittent abdominal pain -- [facility] GE study 2015 showed: Decreased 2-hour gastric retention. Study was terminated early as the patient vomited. --Small frequent meals, antiemetics as needed.  Creon added by Dr. Benson Norway.  Continue Reglan.  Keep follow-up with [place] to be a challenge with poor oral intake.   -- CBG running high.  We will increase Semglee, continue sliding scale insulin, increase meal coverage. --Add Bentyl   Intermittent mild hypotension --No apparent signs or symptoms of infection or severe illness -- Random cortisol unrevealing, blood pressure stable  Severe hypophosphatemia -- Still low.  Continue aggressive repletion.   Bilateral lower extremity edema secondary to volume resuscitation. -- 1 dose of Lasix today..   Acute kidney injury with baseline creatinine 0.96.  Secondary to DKA. -- Resolved.  Secondary to DKA.   Continue to hold losartan, Bactrim.   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 -- In and out cath as needed.   Macrocytic anemia, longstanding.  B12 within normal limits in June. -- Hemoglobin stable   Chronic pain syndrome and fibromyalgia, managed by [facility] pain management with gabapentin.      Subjective:  Feels about the same, doesn't want to eat more, feels bloated, still has some abdominal pain  Physical Exam: Vitals:   12/08/21 0709 12/08/21 0932 12/08/21 1305 12/08/21 1350  BP:  106/67 112/72 99/71  Pulse:  (!) 108 (!) 113 (!) 111  Resp:  17  20  Temp:  98.5 F (36.9 C) 98.7 F (37.1 C) 98 F (36.7 C)  TempSrc:  Oral Oral Oral  SpO2: 98% 98% 96% 98%  Weight:      Height:       Physical Exam Vitals reviewed.  Constitutional:      General: She is not in acute distress.    Appearance: She is not ill-appearing or toxic-appearing.  Cardiovascular:     Rate and Rhythm: Normal rate and regular rhythm.     Heart sounds: No murmur heard. Pulmonary:     Effort: Pulmonary effort is normal. No respiratory distress.     Breath sounds: No wheezing, rhonchi or rales.  Abdominal:     General: There is distension.     Palpations: Abdomen is soft.     Tenderness: There is no abdominal tenderness.  Musculoskeletal:     Right lower leg: Edema present.     Left lower leg: Edema present.  Neurological:     Mental Status: She is alert.  Psychiatric:        Mood and  Affect: Mood normal.        Behavior: Behavior normal.   ' Data Reviewed:  CBG stable Phos 1.7 K+ 4.1  Family Communication: none  Disposition: Status is: Inpatient Remains inpatient appropriate because: poor oral intake  Planned Discharge Destination: Home    Time spent: 20 minutes  Author: Murray Hodgkins, MD 12/08/2021 6:05 PM  For on call review [email or web address].

## 2021-12-09 DIAGNOSIS — E1343 Other specified diabetes mellitus with diabetic autonomic (poly)neuropathy: Secondary | ICD-10-CM | POA: Diagnosis not present

## 2021-12-09 DIAGNOSIS — E1065 Type 1 diabetes mellitus with hyperglycemia: Secondary | ICD-10-CM | POA: Diagnosis not present

## 2021-12-09 LAB — PREALBUMIN: Prealbumin: 6 mg/dL — ABNORMAL LOW (ref 18–38)

## 2021-12-09 LAB — BASIC METABOLIC PANEL
Anion gap: 8 (ref 5–15)
BUN: <5 mg/dL — ABNORMAL LOW (ref 6–20)
CO2: 15 mmol/L — ABNORMAL LOW (ref 22–32)
Calcium: 8.2 mg/dL — ABNORMAL LOW (ref 8.9–10.3)
Chloride: 110 mmol/L (ref 98–111)
Creatinine, Ser: 1.11 mg/dL — ABNORMAL HIGH (ref 0.44–1.00)
GFR, Estimated: >60 mL/min (ref >60)
Glucose, Bld: 377 mg/dL — ABNORMAL HIGH (ref 70–99)
Potassium: 5 mmol/L (ref 3.5–5.1)
Sodium: 133 mmol/L — ABNORMAL LOW (ref 135–145)

## 2021-12-09 LAB — GLUCOSE, CAPILLARY
Glucose-Capillary: 361 mg/dL — ABNORMAL HIGH (ref 70–99)
Glucose-Capillary: 343 mg/dL — ABNORMAL HIGH (ref 70–99)
Glucose-Capillary: 283 mg/dL — ABNORMAL HIGH (ref 70–99)
Glucose-Capillary: 255 mg/dL — ABNORMAL HIGH (ref 70–99)
Glucose-Capillary: 308 mg/dL — ABNORMAL HIGH (ref 70–99)

## 2021-12-09 LAB — PHOSPHORUS: Phosphorus: 2.9 mg/dL (ref 2.5–4.6)

## 2021-12-09 LAB — MAGNESIUM: Magnesium: 2 mg/dL (ref 1.7–2.4)

## 2021-12-09 MED ORDER — INSULIN ASPART 100 UNIT/ML IJ SOLN
0.0000 [IU] | Freq: Every day | INTRAMUSCULAR | Status: DC
  Administered 2021-12-10: 4 [IU] via SUBCUTANEOUS

## 2021-12-09 MED ORDER — INSULIN GLARGINE-YFGN 100 UNIT/ML ~~LOC~~ SOLN
12.0000 [IU] | Freq: Every day | SUBCUTANEOUS | Status: DC
  Administered 2021-12-09: 12 [IU] via SUBCUTANEOUS
  Filled 2021-12-09 (×2): qty 0.12

## 2021-12-09 MED ORDER — FUROSEMIDE 20 MG PO TABS
20.0000 mg | ORAL_TABLET | Freq: Every day | ORAL | Status: DC
  Administered 2021-12-09 – 2021-12-10 (×2): 20 mg via ORAL
  Filled 2021-12-09 (×2): qty 1

## 2021-12-09 NOTE — Progress Notes (Signed)
rm [place]. Adm w/ DKA. CBG is 308 now. Pt. wanted RN to checked CBG.  NO HS covered. PT does not want CBG to drop too low w/ insulin intervention.

## 2021-12-09 NOTE — Progress Notes (Signed)
Progress Note   Patient: Anna Garcia:[id number] DOB: 11-01-1980 DOA: 12/04/2021     4 DOS: the patient was seen and examined on 12/09/2021   Brief hospital course: 41 year old woman PMH diabetes mellitus type 1 with diabetic gastroparesis, frequent and recurrent refractory nausea and vomiting and metabolic derangement, recently hospitalized for same, presented with nausea and vomiting.  Admitted for nausea, vomiting and abdominal pain with plan for IV hydration, antiemetics.    Admitted July 30 through August 1 after presenting with intractable nausea and vomiting.  Discharge diagnoses intractable nausea and vomiting, anion gap metabolic acidosis, comorbidities include diabetes mellitus type 1, gastroparesis.  Discharged on long-acting insulin and carb coverage.  --8/20 somewhat improved today, tolerating solid diet, still reports abdominal pain.  Assessment and Plan: Severe DKA with anion gap metabolic acidosis, pH 4.09, CO2 undetectable, complicated by associated AKI. -- DKA resolved.   DM type 1 w/ diabetic gastroparesis, nausea, intermittent abdominal pain -- [facility] GE study 2015 showed: Decreased 2-hour gastric retention. Study was terminated early as the patient vomited. --Small frequent meals, antiemetics as needed.  Creon added by Dr. Benson Norway.  Continue Reglan.  Keep follow-up with [place] to be a challenge but oral intake improving today. -- CBG running high.  Increase Semglee further, continue sliding scale insulin, meal coverage. -- Continue Bentyl   Intermittent mild hypotension --No apparent signs or symptoms of infection or severe illness -- Random cortisol unrevealing, blood pressure stable   Severe hypophosphatemia -- Repleted.   Bilateral lower extremity edema secondary to volume resuscitation. -- Low-dose oral Lasix.   Acute kidney injury with baseline creatinine 0.96.  Secondary to DKA. -- Resolved.  Secondary to DKA.  Continue to hold losartan,  Bactrim.   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 -- In and out cath as needed.   Macrocytic anemia, longstanding.  B12 within normal limits in June. -- Hemoglobin stable   Chronic pain syndrome and fibromyalgia, managed by [facility] pain management with gabapentin.     Subjective:  Feels about the same Ate eggs for breakfast Salmon for lunch  Physical Exam: Vitals:   12/08/21 2128 12/09/21 0457 12/09/21 1046 12/09/21 1345  BP: 112/63 (!) 97/51 (!) 94/50 121/88  Pulse: 76 98 99 (!) 109  Resp: 16 20    Temp: 97.6 F (36.4 C) 98.1 F (36.7 C) 98.3 F (36.8 C) 98.3 F (36.8 C)  TempSrc: Oral Oral Oral Oral  SpO2: 100% 97% 99% 98%  Weight:      Height:       Physical Exam Vitals reviewed.  Constitutional:      General: She is not in acute distress.    Appearance: She is not ill-appearing or toxic-appearing.     Comments: Appears better today  Cardiovascular:     Rate and Rhythm: Normal rate and regular rhythm.     Heart sounds: No murmur heard. Pulmonary:     Effort: Pulmonary effort is normal. No respiratory distress.     Breath sounds: No wheezing, rhonchi or rales.  Abdominal:     General: Abdomen is flat. There is no distension.     Tenderness: There is no abdominal tenderness.  Musculoskeletal:     Right lower leg: Edema present.     Left lower leg: Edema present.  Neurological:     Mental Status: She is alert.  Psychiatric:        Mood and Affect: Mood normal.  Behavior: Behavior normal.     Data Reviewed:  UOP 1350 Creatinine up to 1.11  Family Communication: None   Disposition: Status is: Inpatient Remains inpatient appropriate because: abd pain, poor intake  Planned Discharge Destination: Home    Time spent: 20 minutes  Author: Murray Hodgkins, MD 12/09/2021 3:56 PM  For on call review [email or web address].

## 2021-12-10 DIAGNOSIS — E1065 Type 1 diabetes mellitus with hyperglycemia: Secondary | ICD-10-CM | POA: Diagnosis not present

## 2021-12-10 DIAGNOSIS — E1343 Other specified diabetes mellitus with diabetic autonomic (poly)neuropathy: Secondary | ICD-10-CM | POA: Diagnosis not present

## 2021-12-10 LAB — MAGNESIUM: Magnesium: 1.9 mg/dL (ref 1.7–2.4)

## 2021-12-10 LAB — GLUCOSE, CAPILLARY
Glucose-Capillary: 300 mg/dL — ABNORMAL HIGH (ref 70–99)
Glucose-Capillary: 300 mg/dL — ABNORMAL HIGH (ref 70–99)
Glucose-Capillary: 304 mg/dL — ABNORMAL HIGH (ref 70–99)
Glucose-Capillary: 282 mg/dL — ABNORMAL HIGH (ref 70–99)
Glucose-Capillary: 259 mg/dL — ABNORMAL HIGH (ref 70–99)
Glucose-Capillary: 456 mg/dL — ABNORMAL HIGH (ref 70–99)

## 2021-12-10 LAB — BASIC METABOLIC PANEL
Anion gap: 11 (ref 5–15)
BUN: 8 mg/dL (ref 6–20)
CO2: 14 mmol/L — ABNORMAL LOW (ref 22–32)
Calcium: 8.6 mg/dL — ABNORMAL LOW (ref 8.9–10.3)
Chloride: 108 mmol/L (ref 98–111)
Creatinine, Ser: 1.16 mg/dL — ABNORMAL HIGH (ref 0.44–1.00)
GFR, Estimated: >60 mL/min (ref >60)
Glucose, Bld: 300 mg/dL — ABNORMAL HIGH (ref 70–99)
Potassium: 4.4 mmol/L (ref 3.5–5.1)
Sodium: 133 mmol/L — ABNORMAL LOW (ref 135–145)

## 2021-12-10 LAB — CORTISOL: Cortisol, Plasma: 9.6 ug/dL

## 2021-12-10 LAB — PHOSPHORUS: Phosphorus: 2.8 mg/dL (ref 2.5–4.6)

## 2021-12-10 MED ORDER — INSULIN GLARGINE-YFGN 100 UNIT/ML ~~LOC~~ SOLN
18.0000 [IU] | Freq: Every day | SUBCUTANEOUS | Status: DC
Start: 2021-12-10 — End: 2021-12-11
  Administered 2021-12-10: 18 [IU] via SUBCUTANEOUS
  Filled 2021-12-10 (×2): qty 0.18

## 2021-12-10 MED ORDER — FUROSEMIDE 10 MG/ML IJ SOLN
20.0000 mg | Freq: Once | INTRAMUSCULAR | Status: AC
  Administered 2021-12-10: 20 mg via INTRAVENOUS
  Filled 2021-12-10: qty 2

## 2021-12-10 MED ORDER — INSULIN ASPART 100 UNIT/ML IJ SOLN
10.0000 [IU] | Freq: Once | INTRAMUSCULAR | Status: AC
  Administered 2021-12-10: 10 [IU] via SUBCUTANEOUS

## 2021-12-10 NOTE — Progress Notes (Signed)
Progress Note   Patient: Anna Garcia PXT:[id number] DOB: [birth year] DOA: 12/04/2021     5 DOS: the patient was seen and examined on 12/10/2021   Brief hospital course: 41 year old woman PMH diabetes mellitus type 1 with diabetic gastroparesis, frequent and recurrent refractory nausea and vomiting and metabolic derangement, recently hospitalized for same, presented with nausea and vomiting.  Admitted for nausea, vomiting and abdominal pain with plan for IV hydration, antiemetics.    Admitted July 30 through August 1 after presenting with intractable nausea and vomiting.  Discharge diagnoses intractable nausea and vomiting, anion gap metabolic acidosis, comorbidities include diabetes mellitus type 1, gastroparesis.  Discharged on long-acting insulin and carb coverage.  --8/21 improving, tolerating increased diet, still has LE edema.  Monitor on diet advancement and likely home in next 48 hours if continues to improve.  Assessment and Plan: Severe DKA with anion gap metabolic acidosis, pH 6.27, CO2 undetectable, complicated by associated AKI. -- DKA resolved.   DM type 1 w/ diabetic gastroparesis, nausea, intermittent abdominal pain -- [facility] GE study 2015 showed: Decreased 2-hour gastric retention. Study was terminated early as the patient vomited. --Creon added by Dr. Benson Norway.  Continue Reglan.  Keep follow-up with [facility] to be a challenge but oral intake improving last 48 hours. -- CBG running high.  Increase Semglee further, continue sliding scale insulin, meal coverage. -- Continue Bentyl   Intermittent mild hypotension --No apparent signs or symptoms of infection or severe illness -- Random cortisol unrevealing, blood pressure stable   Severe hypophosphatemia -- Repleted.   Bilateral lower extremity edema secondary to volume resuscitation. -- Lasix.  Good urine output.   Acute kidney injury with baseline creatinine 0.96.  Secondary to DKA. -- Resolved.  Secondary to  DKA.  Continue to hold losartan, Bactrim.   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 -- In and out cath as needed.   Macrocytic anemia, longstanding.  B12 within normal limits in June. -- Hemoglobin stable   Chronic pain syndrome and fibromyalgia, managed by [facility] pain management with gabapentin.      Subjective:  Feels some better Ate eggs for breakfast Had Caesar and chicken salad for lunch Less abd bloating  Physical Exam: Vitals:   12/10/21 0443 12/10/21 0759 12/10/21 1006 12/10/21 1357  BP: 105/67  113/69 110/70  Pulse: 94  (!) 110 (!) 107  Resp: '18  16 16  '$ Temp: 97.9 F (36.6 C)  98.3 F (36.8 C) 98 F (36.7 C)  TempSrc: Oral  Oral Oral  SpO2: 99% 98% 98% 97%  Weight:      Height:       Physical Exam Vitals reviewed.  Constitutional:      General: She is not in acute distress.    Appearance: She is not ill-appearing or toxic-appearing.  Cardiovascular:     Rate and Rhythm: Regular rhythm. Tachycardia present.     Heart sounds: No murmur heard. Pulmonary:     Effort: Pulmonary effort is normal. No respiratory distress.     Breath sounds: Rales present. No wheezing or rhonchi.  Neurological:     Mental Status: She is alert.  Psychiatric:        Behavior: Behavior normal.     Data Reviewed:  UOP 2500 Creatinine 1.16 up CBG 200-300s  Family Communication: none  Disposition: Status is: Inpatient Remains inpatient appropriate because: poor oral intake, hyperglycemia, LE edema  Planned Discharge Destination: Home    Time spent: 20 minutes  Author: Murray Hodgkins, MD 12/10/2021 4:54 PM  For on call review [email or web address].

## 2021-12-10 NOTE — Progress Notes (Signed)
UNASSIGNED PATIENT Subjective: Patient seems to be doing well from a GI standpoint today. She denies having any nausea vomiting or abdominal pain. She seems to be tolerating the Reglan well. Intake seems to be improving as well.  Objective: Vital signs in last 24 hours: Temp:  [97.9 F (36.6 C)-98.5 F (36.9 C)] 98.3 F (36.8 C) (08/21 1006) Pulse Rate:  [94-110] 110 (08/21 1006) Resp:  [16-18] 16 (08/21 1006) BP: (102-121)/(67-88) 113/69 (08/21 1006) SpO2:  [97 %-99 %] 98 % (08/21 1006) Last BM Date : 12/07/21  Intake/Output from previous day: 08/20 0701 - 08/21 0700 In: 1200 [P.O.:1200] Out: 2500 [Urine:2500] Intake/Output this shift: Total I/O In: 240 [P.O.:240] Out: 600 [Urine:600]  General appearance: alert, cooperative, fatigued, and no distress Resp: clear to auscultation bilaterally Cardio: regular rate and rhythm, S1, S2 normal, no murmur, click, rub or gallop GI: soft, non-tender; bowel sounds normal; no masses,  no organomegaly  Lab Results: No results for input(s): "WBC", "HGB", "HCT", "PLT" in the last 72 hours. BMET Recent Labs    12/08/21 1118 12/09/21 0609 12/10/21 0559  NA 134* 133* 133*  K 4.1 5.0 4.4  CL 110 110 108  CO2 16* 15* 14*  GLUCOSE 377* 377* 300*  BUN <5* <5* 8  CREATININE 0.89 1.11* 1.16*  CALCIUM 8.1* 8.2* 8.6*   Studies/Results: No results found.  Medications: I have reviewed the patient's current medications. Prior to Admission:  Medications Prior to Admission  Medication Sig Dispense Refill Last Dose   BAQSIMI TWO PACK 3 MG/DOSE POWD Place 1 spray into both nostrils as needed (for a very low blood sugar emergency, when unable to eat or drink and need help from someone else- or as otherwise directed).   unk   DEXILANT 60 MG capsule Take 1 capsule (60 mg total) by mouth daily. 30 capsule 0 12/02/2021   famotidine (PEPCID) 40 MG tablet Take 40 mg by mouth 2 (two) times daily.   12/02/2021   gabapentin (NEURONTIN) 300 MG capsule  Take 1 capsule (300 mg total) by mouth 3 (three) times daily. 90 capsule 1 12/02/2021   granisetron (KYTRIL) 1 MG tablet Take 1 mg by mouth every 12 (twelve) hours.   12/03/2021 at pm   insulin glargine (LANTUS SOLOSTAR) 100 UNIT/ML Solostar Pen Inject 22 Units into the skin daily. (Patient taking differently: Inject 7 Units into the skin at bedtime.) 15 mL 1 12/02/2021 at pm   insulin glulisine (APIDRA SOLOSTAR) 100 UNIT/ML Solostar Pen Inject 0-8 Units into the skin See admin instructions. Inject 0-8 units into the skin daily AS NEEDED if blood glucose levels are trending upwards- changes as per carb intake   12/04/2021 at am   loperamide (IMODIUM) 2 MG capsule Take 1 capsule (2 mg total) by mouth as needed for diarrhea or loose stools. 30 capsule 0 Past Week   losartan (COZAAR) 25 MG tablet Take 25 mg by mouth daily.   12/02/2021   mometasone-formoterol (DULERA) 100-5 MCG/ACT AERO Inhale 2 puffs into the lungs 2 (two) times daily. 1 each 1 unk   montelukast (SINGULAIR) 10 MG tablet Take 10 mg by mouth at bedtime.   12/02/2021 at pm   MOTEGRITY 1 MG TABS Take 1 mg by mouth daily.   11/30/2021   rosuvastatin (CRESTOR) 20 MG tablet Take 20 mg by mouth at bedtime.   12/02/2021 at pm   [EXPIRED] sulfamethoxazole-trimethoprim (BACTRIM DS) 800-160 MG tablet Take 1 tablet by mouth 2 (two) times daily.   12/03/2021  Vitamin D, Ergocalciferol, (DRISDOL) 1.25 MG (50000 UNIT) CAPS capsule Take 1 capsule (50,000 Units total) by mouth every 7 (seven) days. (Patient not taking: Reported on 12/04/2021) 5 capsule 0 Not Taking   Scheduled:  dicyclomine  10 mg Oral TID AC & HS   dronabinol  2.5 mg Oral BID AC   enoxaparin (LOVENOX) injection  40 mg Subcutaneous Q24H   feeding supplement (GLUCERNA SHAKE)  237 mL Oral Q24H   furosemide  20 mg Intravenous Once   gabapentin  300 mg Oral TID   insulin aspart  0-5 Units Subcutaneous QHS   insulin aspart  0-9 Units Subcutaneous TID WC   insulin aspart  4 Units Subcutaneous  TID WC   insulin glargine-yfgn  18 Units Subcutaneous Daily   lipase/protease/amylase  36,000 Units Oral TID WC   metoCLOPramide (REGLAN) injection  10 mg Intravenous TID AC   mometasone-formoterol  2 puff Inhalation BID   montelukast  10 mg Oral QHS   multivitamin with minerals  1 tablet Oral Daily   pantoprazole  40 mg Oral Daily   Ensure Max Protein  11 oz Oral BID   Prucalopride Succinate  1 mg Oral Daily   rosuvastatin  20 mg Oral Daily   sodium chloride flush  10-40 mL Intracatheter Q12H   Continuous: IOM:[id number] **OR** acetaminophen, dextrose, ondansetron, simethicone, sodium chloride flush  Assessment/Plan: 1) IDDM with severe gastroparesis-seems to be improving on Reglan. She will need stricter glycemic control and this has been discussed with her in great detail. 2) Severe DKA with anion gap metabolic acidosis-resolved. 3) Hypophosphatemia-repleted. 4) Macrocytic anemia. 5) AKI-resolved. 6) Chronic pain syndrome and fibromyalgia.   LOS: 5 days   Juanita Craver 12/10/2021, 11:48 AM

## 2021-12-10 NOTE — Progress Notes (Signed)
Mobility Specialist Cancellation/Refusal Note:   b Reason for Cancellation/Refusal: Pt declined mobility at this time. Pt mentioned just coming back from a walk. Will check back in after lunch & if schedule permits.    [facility]

## 2021-12-11 ENCOUNTER — Encounter (HOSPITAL_COMMUNITY): Payer: Self-pay

## 2021-12-11 ENCOUNTER — Inpatient Hospital Stay (HOSPITAL_COMMUNITY)
Admission: EM | Admit: 2021-12-11 | Discharge: 2021-12-18 | DRG: 640 | Disposition: A | Discharge status: Home / Self Care | Payer: Medicare Other | Attending: Internal Medicine | Admitting: Internal Medicine

## 2021-12-11 ENCOUNTER — Other Ambulatory Visit: Payer: Self-pay

## 2021-12-11 DIAGNOSIS — Z8249 Family history of ischemic heart disease and other diseases of the circulatory system: Secondary | ICD-10-CM

## 2021-12-11 DIAGNOSIS — Z794 Long term (current) use of insulin: Secondary | ICD-10-CM

## 2021-12-11 DIAGNOSIS — E8779 Other fluid overload: Secondary | ICD-10-CM | POA: Diagnosis not present

## 2021-12-11 DIAGNOSIS — D539 Nutritional anemia, unspecified: Secondary | ICD-10-CM | POA: Diagnosis present

## 2021-12-11 DIAGNOSIS — R9431 Abnormal electrocardiogram [ECG] [EKG]: Secondary | ICD-10-CM | POA: Diagnosis present

## 2021-12-11 DIAGNOSIS — E1343 Other specified diabetes mellitus with diabetic autonomic (poly)neuropathy: Secondary | ICD-10-CM | POA: Diagnosis present

## 2021-12-11 DIAGNOSIS — T503X5A Adverse effect of electrolytic, caloric and water-balance agents, initial encounter: Secondary | ICD-10-CM | POA: Diagnosis present

## 2021-12-11 DIAGNOSIS — K760 Fatty (change of) liver, not elsewhere classified: Secondary | ICD-10-CM | POA: Diagnosis present

## 2021-12-11 DIAGNOSIS — R339 Retention of urine, unspecified: Secondary | ICD-10-CM | POA: Diagnosis present

## 2021-12-11 DIAGNOSIS — Z87891 Personal history of nicotine dependence: Secondary | ICD-10-CM

## 2021-12-11 DIAGNOSIS — Z6833 Body mass index (BMI) 33.0-33.9, adult: Secondary | ICD-10-CM

## 2021-12-11 DIAGNOSIS — F323 Major depressive disorder, single episode, severe with psychotic features: Secondary | ICD-10-CM | POA: Diagnosis not present

## 2021-12-11 DIAGNOSIS — E1010 Type 1 diabetes mellitus with ketoacidosis without coma: Secondary | ICD-10-CM | POA: Diagnosis present

## 2021-12-11 DIAGNOSIS — G894 Chronic pain syndrome: Secondary | ICD-10-CM | POA: Diagnosis present

## 2021-12-11 DIAGNOSIS — Z79899 Other long term (current) drug therapy: Secondary | ICD-10-CM

## 2021-12-11 DIAGNOSIS — M797 Fibromyalgia: Secondary | ICD-10-CM | POA: Diagnosis present

## 2021-12-11 DIAGNOSIS — E785 Hyperlipidemia, unspecified: Secondary | ICD-10-CM | POA: Diagnosis present

## 2021-12-11 DIAGNOSIS — N179 Acute kidney failure, unspecified: Secondary | ICD-10-CM | POA: Diagnosis not present

## 2021-12-11 DIAGNOSIS — K7460 Unspecified cirrhosis of liver: Secondary | ICD-10-CM | POA: Diagnosis present

## 2021-12-11 DIAGNOSIS — E1065 Type 1 diabetes mellitus with hyperglycemia: Secondary | ICD-10-CM | POA: Diagnosis not present

## 2021-12-11 DIAGNOSIS — E1110 Type 2 diabetes mellitus with ketoacidosis without coma: Principal | ICD-10-CM | POA: Diagnosis present

## 2021-12-11 DIAGNOSIS — I10 Essential (primary) hypertension: Secondary | ICD-10-CM | POA: Diagnosis present

## 2021-12-11 DIAGNOSIS — I959 Hypotension, unspecified: Secondary | ICD-10-CM | POA: Diagnosis present

## 2021-12-11 DIAGNOSIS — E876 Hypokalemia: Secondary | ICD-10-CM | POA: Diagnosis present

## 2021-12-11 DIAGNOSIS — E669 Obesity, unspecified: Secondary | ICD-10-CM | POA: Diagnosis present

## 2021-12-11 DIAGNOSIS — D6959 Other secondary thrombocytopenia: Secondary | ICD-10-CM | POA: Diagnosis present

## 2021-12-11 DIAGNOSIS — R262 Difficulty in walking, not elsewhere classified: Secondary | ICD-10-CM | POA: Diagnosis present

## 2021-12-11 DIAGNOSIS — F05 Delirium due to known physiological condition: Secondary | ICD-10-CM | POA: Diagnosis not present

## 2021-12-11 DIAGNOSIS — Z823 Family history of stroke: Secondary | ICD-10-CM

## 2021-12-11 DIAGNOSIS — F419 Anxiety disorder, unspecified: Secondary | ICD-10-CM | POA: Diagnosis not present

## 2021-12-11 DIAGNOSIS — K219 Gastro-esophageal reflux disease without esophagitis: Secondary | ICD-10-CM | POA: Diagnosis present

## 2021-12-11 DIAGNOSIS — E860 Dehydration: Secondary | ICD-10-CM | POA: Diagnosis present

## 2021-12-11 DIAGNOSIS — K3184 Gastroparesis: Secondary | ICD-10-CM | POA: Diagnosis present

## 2021-12-11 DIAGNOSIS — E1042 Type 1 diabetes mellitus with diabetic polyneuropathy: Secondary | ICD-10-CM | POA: Diagnosis present

## 2021-12-11 DIAGNOSIS — E1043 Type 1 diabetes mellitus with diabetic autonomic (poly)neuropathy: Secondary | ICD-10-CM | POA: Diagnosis present

## 2021-12-11 DIAGNOSIS — E0810 Diabetes mellitus due to underlying condition with ketoacidosis without coma: Secondary | ICD-10-CM | POA: Diagnosis not present

## 2021-12-11 LAB — BASIC METABOLIC PANEL
Anion gap: 11 (ref 5–15)
BUN: 10 mg/dL (ref 6–20)
CO2: 17 mmol/L — ABNORMAL LOW (ref 22–32)
Calcium: 8.7 mg/dL — ABNORMAL LOW (ref 8.9–10.3)
Chloride: 105 mmol/L (ref 98–111)
Creatinine, Ser: 1.33 mg/dL — ABNORMAL HIGH (ref 0.44–1.00)
GFR, Estimated: 52 mL/min — ABNORMAL LOW (ref >60)
Glucose, Bld: 361 mg/dL — ABNORMAL HIGH (ref 70–99)
Potassium: 3.1 mmol/L — ABNORMAL LOW (ref 3.5–5.1)
Sodium: 133 mmol/L — ABNORMAL LOW (ref 135–145)

## 2021-12-11 LAB — PHOSPHORUS: Phosphorus: 2.5 mg/dL (ref 2.5–4.6)

## 2021-12-11 LAB — BLOOD GAS, VENOUS
Acid-base deficit: 9.5 mmol/L — ABNORMAL HIGH (ref 0.0–2.0)
Bicarbonate: 13.9 mmol/L — ABNORMAL LOW (ref 20.0–28.0)
O2 Saturation: 29.5 %
Patient temperature: 37
pCO2, Ven: 24 mmHg — ABNORMAL LOW (ref 44–60)
pH, Ven: 7.37 (ref 7.25–7.43)
pO2, Ven: <31 mmHg — CL (ref 32–45)

## 2021-12-11 LAB — GLUCOSE, CAPILLARY
Glucose-Capillary: 349 mg/dL — ABNORMAL HIGH (ref 70–99)
Glucose-Capillary: 394 mg/dL — ABNORMAL HIGH (ref 70–99)

## 2021-12-11 LAB — CBG MONITORING, ED: Glucose-Capillary: 374 mg/dL — ABNORMAL HIGH (ref 70–99)

## 2021-12-11 LAB — MAGNESIUM: Magnesium: 1.8 mg/dL (ref 1.7–2.4)

## 2021-12-11 MED ORDER — INSULIN ASPART 100 UNIT/ML IJ SOLN
0.0000 [IU] | Freq: Three times a day (TID) | INTRAMUSCULAR | Status: DC
  Administered 2021-12-11: 15 [IU] via SUBCUTANEOUS

## 2021-12-11 MED ORDER — PANCRELIPASE (LIP-PROT-AMYL) 36000-114000 UNITS PO CPEP: 36000.0000 [IU] | ORAL_CAPSULE | Freq: Three times a day (TID) | ORAL | 1 refills | Status: AC

## 2021-12-11 MED ORDER — DICYCLOMINE HCL 10 MG PO CAPS: 10.0000 mg | ORAL_CAPSULE | Freq: Three times a day (TID) | ORAL | 1 refills | Status: DC

## 2021-12-11 MED ORDER — POTASSIUM CHLORIDE CRYS ER 20 MEQ PO TBCR
40.0000 meq | EXTENDED_RELEASE_TABLET | Freq: Once | ORAL | Status: AC
  Administered 2021-12-11: 40 meq via ORAL
  Filled 2021-12-11: qty 2

## 2021-12-11 MED ORDER — INSULIN GLARGINE-YFGN 100 UNIT/ML ~~LOC~~ SOLN
22.0000 [IU] | Freq: Every day | SUBCUTANEOUS | Status: DC
  Administered 2021-12-11: 22 [IU] via SUBCUTANEOUS
  Filled 2021-12-11: qty 0.22

## 2021-12-11 MED ORDER — FUROSEMIDE 10 MG/ML IJ SOLN
20.0000 mg | Freq: Once | INTRAMUSCULAR | Status: AC
  Administered 2021-12-11: 20 mg via INTRAVENOUS
  Filled 2021-12-11: qty 2

## 2021-12-11 MED ORDER — INSULIN ASPART 100 UNIT/ML IJ SOLN: 0.0000 [IU] | Freq: Every day | INTRAMUSCULAR | Status: DC

## 2021-12-11 MED ORDER — METOCLOPRAMIDE HCL 10 MG PO TABS: 10.0000 mg | ORAL_TABLET | Freq: Four times a day (QID) | ORAL | 0 refills | Status: DC | PRN

## 2021-12-11 NOTE — Discharge Summary (Signed)
Physician Discharge Summary   Patient: Anna Garcia MRN: [id number] DOB: [birth year]  Admit date:     12/04/2021  Discharge date: 12/11/21  Discharge Physician: Murray Hodgkins   PCP: Day, [name], MD   Recommendations at discharge:     DM type 1 w/ diabetic gastroparesis, nausea, intermittent abdominal pain --Creon added by Dr. Benson Norway.  Continue Reglan.  Keep follow-up with [place] which seems to have provided some improvement.  Discharge Diagnoses: Principal Problem:   DKA (diabetic ketoacidosis) ([place]) Active Problems:   Gastroparesis due to secondary diabetes ([place])   Hypokalemia   Uncontrolled type 1 diabetes mellitus with hyperglycemia, with long-term current use of insulin ([facility])   GERD (gastroesophageal reflux disease)   AKI (acute kidney injury) ([facility])   Fibromyalgia   Refractory nausea and vomiting   Macrocytic anemia   Enteritis   Chronic pain syndrome  Resolved Problems:   * No resolved hospital problems. 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  Admitted for nausea, vomiting and abdominal pain secondary to diabetic gastroparesis.  Found to have DKA very early in hospitalization which resolved with standard therapy.  Admitted July 30 through August 1 after presenting with intractable nausea and vomiting.  Discharge diagnoses intractable nausea and vomiting, anion gap metabolic acidosis, comorbidities include diabetes mellitus type 1, gastroparesis.  Discharged on long-acting insulin and carb coverage.  --Overall slowly improved, seen by gastroenterology, continue with conservative management, Creon and Reglan.  Keep follow-up with [facility] gastroenterology as an outpatient.  Severe DKA with anion gap metabolic acidosis, pH 3.29, CO2  undetectable, complicated by associated AKI. -- DKA resolved.   DM type 1 w/ diabetic gastroparesis, nausea, intermittent abdominal pain -- [facility] GE study 2015 showed: Decreased 2-hour gastric retention. Study was terminated early as the patient vomited. --Creon added by Dr. Benson Norway.  Continue Reglan.  Keep follow-up with [facility] which seems to have provided some improvement.   Intermittent mild hypotension --No apparent signs or symptoms of infection or severe illness -- Random cortisol unrevealing, blood pressure stable   Severe hypophosphatemia -- Repleted.   Bilateral lower extremity edema secondary to volume resuscitation. --Good diuresis but still has lower extremity edema.  She reports that this happens intermittently and resolves on its own.  She request discharge home.   Acute kidney injury with baseline creatinine 0.96.  Secondary to DKA. -- Resolved.  Secondary to DKA.    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 -- In and out cath as needed.   Macrocytic anemia, longstanding.  B12 within normal limits in June. -- Hemoglobin stable   Chronic pain syndrome and fibromyalgia, managed by [facility] pain management with gabapentin.       Consultants:  GI  Procedures performed:  None   Disposition: Home Diet recommendation:  Diet Orders (From admission, onward)     Start     Ordered   12/11/21 0000  Diet Carb Modified        12/11/21 1537   12/10/21 0804  Diet Carb Modified Fluid consistency: Thin; Room service appropriate? Yes  Diet effective now       Question Answer Comment  Diet-HS Snack? Nothing   Calorie Level Medium 1600-2000   Fluid consistency: Thin   Room service appropriate? Yes  12/10/21 0804            DISCHARGE MEDICATION: Allergies as of 12/11/2021   No Known Allergies      Medication List     STOP taking these medications    Bactrim DS 800-160 MG tablet Generic drug:  sulfamethoxazole-trimethoprim   Vitamin D (Ergocalciferol) 1.25 MG (50000 UNIT) Caps capsule Commonly known as: DRISDOL       TAKE these medications    Apidra SoloStar 100 UNIT/ML Solostar Pen Generic drug: insulin glulisine Inject 0-8 Units into the skin See admin instructions. Inject 0-8 units into the skin daily AS NEEDED if blood glucose levels are trending upwards- changes as per carb intake   Baqsimi Two Pack 3 MG/DOSE Powd Generic drug: Glucagon Place 1 spray into both nostrils as needed (for a very low blood sugar emergency, when unable to eat or drink and need help from someone else- or as otherwise directed).   Dexilant 60 MG capsule Generic drug: dexlansoprazole Take 1 capsule (60 mg total) by mouth daily.   dicyclomine 10 MG capsule Commonly known as: BENTYL Take 1 capsule (10 mg total) by mouth 4 (four) times daily -  before meals and at bedtime.   famotidine 40 MG tablet Commonly known as: PEPCID Take 40 mg by mouth 2 (two) times daily.   gabapentin 300 MG capsule Commonly known as: NEURONTIN Take 1 capsule (300 mg total) by mouth 3 (three) times daily.   granisetron 1 MG tablet Commonly known as: KYTRIL Take 1 mg by mouth every 12 (twelve) hours.   Lantus SoloStar 100 UNIT/ML Solostar Pen Generic drug: insulin glargine Inject 22 Units into the skin daily. What changed:  how much to take when to take this   lipase/protease/amylase 36000 UNITS Cpep capsule Commonly known as: CREON Take 1 capsule (36,000 Units total) by mouth 3 (three) times daily with meals.   loperamide 2 MG capsule Commonly known as: IMODIUM Take 1 capsule (2 mg total) by mouth as needed for diarrhea or loose stools.   losartan 25 MG tablet Commonly known as: COZAAR Take 25 mg by mouth daily.   metoCLOPramide 10 MG tablet Commonly known as: REGLAN Take 1 tablet (10 mg total) by mouth every 6 (six) hours as needed for nausea.   mometasone-formoterol 100-5 MCG/ACT  Aero Commonly known as: DULERA Inhale 2 puffs into the lungs 2 (two) times daily.   montelukast 10 MG tablet Commonly known as: SINGULAIR Take 10 mg by mouth at bedtime.   Motegrity 1 MG Tabs Generic drug: Prucalopride Succinate Take 1 mg by mouth daily.   rosuvastatin 20 MG tablet Commonly known as: CRESTOR Take 20 mg by mouth at bedtime.        Follow-up Information     Day, [name], MD. Schedule an appointment as soon as possible for a visit in 1 week(s).   Specialty: Internal Medicine Contact information: [place] [address] [phone]                Feels better, tolerating diet would like to go home Discharge Exam: Filed Weights   12/06/21 1304 12/07/21 0835  Weight: 81.3 kg 84 kg   Physical Exam Vitals reviewed.  Constitutional:      General: She is not in acute distress.    Appearance: She is not ill-appearing or toxic-appearing.  Cardiovascular:     Rate and Rhythm: Normal rate and regular rhythm.     Heart sounds: No murmur heard. Pulmonary:  Effort: Pulmonary effort is normal. No respiratory distress.     Breath sounds: No wheezing, rhonchi or rales.  Musculoskeletal:     Right lower leg: Edema present.     Left lower leg: Edema present.  Neurological:     Mental Status: She is alert.  Psychiatric:        Mood and Affect: Mood normal.        Behavior: Behavior normal.   UOP 2900 K+ 3.1 Creatinine 1.33 with diuresis   Condition at discharge: good  The results of significant diagnostics from this hospitalization (including imaging, microbiology, ancillary and laboratory) are listed below for reference.   Imaging Studies: DG Abd Portable 1V  Result Date: 12/06/2021 CLINICAL DATA:  Abdominal pain. EXAM: PORTABLE ABDOMEN - 1 VIEW COMPARISON:  12/04/2021 FINDINGS: No evidence of bowel obstruction or significant ileus. No abnormal calcifications. Stable appearance of IUD. Visualized bony structures are  unremarkable. IMPRESSION: No acute findings. Electronically Signed   By: Aletta Edouard M.D.   On: 12/06/2021 15:45   DG Abdomen Acute W/Chest  Result Date: 12/04/2021 CLINICAL DATA:  Nausea and vomiting with abdominal pain. EXAM: DG ABDOMEN ACUTE WITH 1 VIEW CHEST COMPARISON:  Chest x-ray dated November 18, 2021. FINDINGS: There is no evidence of dilated bowel loops or free intraperitoneal air. Questionable small bowel wall thickening in the left abdomen. No radiopaque calculi or other significant radiographic abnormality is seen. IUD in the pelvis. Heart size and mediastinal contours are within normal limits. Minimal linear atelectasis at the lung bases. No focal consolidation, pleural effusion, or pneumothorax. No acute osseous abnormality. IMPRESSION: 1. Questionable small bowel wall thickening in the left abdomen, which could reflect enteritis. No obstruction. 2. Minimal bibasilar atelectasis. No acute cardiopulmonary disease. Electronically Signed   By: Titus Dubin M.D.   On: 12/04/2021 13:49   US Abdomen Limited RUQ (LIVER/GB)  Result Date: 11/18/2021 CLINICAL DATA:  Vomiting. EXAM: ULTRASOUND ABDOMEN LIMITED RIGHT UPPER QUADRANT COMPARISON:  Abdominal ultrasound 09/24/2021 FINDINGS: Gallbladder: No gallstones or wall thickening visualized. No sonographic Murphy sign noted by sonographer. Common bile duct: Diameter: 7 mm, unchanged. Liver: No focal lesion identified. Increased echogenicity with diffusely coarsened heterogeneous echotexture. Portal vein is patent on color Doppler imaging with normal direction of blood flow towards the liver. Other: None. IMPRESSION: 1. The common bile duct is mildly enlarged measuring 7 mm. This is unchanged from the prior examination. Recommend correlation with lab values. 2. No cholelithiasis. 3. Echogenic heterogeneous liver echotexture may be related to diffuse hepatocellular disease. This can be further evaluated with liver MRI if clinically warranted.  Electronically Signed   By: Ronney Asters M.D.   On: 11/18/2021 22:19   DG Chest Portable 1 View  Result Date: 11/18/2021 CLINICAL DATA:  Shortness of breath and vomiting EXAM: PORTABLE CHEST 1 VIEW COMPARISON:  09/22/2021 FINDINGS: The heart size and mediastinal contours are within normal limits. Both lungs are clear. The visualized skeletal structures are unremarkable. IMPRESSION: No active disease. Electronically Signed   By: Inez Catalina M.D.   On: 11/18/2021 21:41    Microbiology: Results for orders placed or performed during the hospital encounter of 12/04/21  MRSA Next Gen by PCR, Nasal     Status: None   Collection Time: 12/05/21  3:17 AM   Specimen: Nasal Mucosa; Nasal Swab  Result Value Ref Range Status   MRSA by PCR Next Gen NOT DETECTED NOT DETECTED Final    Comment: (NOTE) The GeneXpert MRSA Assay (FDA approved for NASAL specimens  only), is one component of a comprehensive MRSA colonization surveillance program. It is not intended to diagnose MRSA infection nor to guide or monitor treatment for MRSA infections. Test performance is not FDA approved in patients less than 76 years old. Performed at [facility], [address] [place], [place] [address]     Labs: CBC: Recent Labs  Lab 12/05/21 0222  WBC 11.3*  HGB 10.4*  HCT 34.5*  MCV 119.4*  PLT 604   Basic Metabolic Panel: Recent Labs  Lab 12/07/21 0830 12/07/21 1751 12/08/21 1118 12/08/21 2208 12/09/21 0609 12/10/21 0559 12/11/21 0430  NA 132*  --  134*  --  133* 133* 133*  K 3.7  --  4.1  --  5.0 4.4 3.1*  CL 110  --  110  --  110 108 105  CO2 16*  --  16*  --  15* 14* 17*  GLUCOSE 333*  --  377*  --  377* 300* 361*  BUN <5*  --  <5*  --  <5* 8 10  CREATININE 0.91  --  0.89  --  1.11* 1.16* 1.33*  CALCIUM 7.6*  --  8.1*  --  8.2* 8.6* 8.7*  MG 1.9  --  1.8  --  2.0 1.9 1.8  PHOS <1.0*   < > 1.7* 3.2 2.9 2.8 2.5   < > = values in this interval not displayed.   Liver Function  Tests: No results for input(s): "AST", "ALT", "ALKPHOS", "BILITOT", "PROT", "ALBUMIN" in the last 168 hours. CBG: Recent Labs  Lab 12/10/21 1153 12/10/21 1626 12/10/21 2232 12/11/21 0716 12/11/21 1135  GLUCAP 282* 259* 456* 349* 394*    Discharge time spent: greater than 30 minutes.  Signed: Murray Hodgkins, MD [facility] 12/11/2021

## 2021-12-11 NOTE — Care Management Important Message (Signed)
Important Message  Patient Details IM Letter given to the Patient. Name: Anna Garcia MRN: [id number] Date of Birth: [birth year]   Medicare Important Message Given:        Kerin Salen 12/11/2021, 9:28 AM

## 2021-12-11 NOTE — ED Provider Notes (Signed)
[facility] DEPT Provider Note  CSN: [id number] Arrival date & time: 12/11/21 2304  Chief Complaint(s) Hyperglycemia  HPI Anna Garcia is a 41 y.o. female with PMH type 1 diabetes, gastroparesis, frequent hospital admissions for DKA and electrolyte abnormalities who presents emergency department for evaluation of fatigue and inability to walk after discharge today.  Patient was discharged from the ICU today for DKA and states that this morning she was able to ambulate, but when she got home, she felt significantly weaker and is unable to perform her activities of daily living.  She states that her biggest barrier is her significant lower extremity edema that is secondary to excessive fluid resuscitation overall required to treat her frequent episodes of DKA.  She currently denies chest pain, shortness of breath, headache, fever or other systemic symptoms.  Endorses persistent nausea and mild abdominal pain but pain is worse in bilateral lower extremities secondary to the swelling.   Past Medical History Past Medical History:  Diagnosis Date   Diabetes mellitus    Gastroparesis    Hypertension    Neuropathy    Patient Active Problem List   Diagnosis Date Noted   Macrocytic anemia 12/05/2021   Enteritis 12/05/2021   Chronic pain syndrome 12/05/2021   Refractory nausea and vomiting 12/04/2021   High anion gap metabolic acidosis 38/18/2993   GERD (gastroesophageal reflux disease) 11/19/2021   Moderate persistent asthma 11/19/2021   Hypomagnesemia 09/23/2021   Hypophosphatemia 09/23/2021   Fever 05/27/2021   Left genital labial abscess 05/25/2021   Sinus tachycardia    Labial swelling: Left 05/24/2021   Vulvar abscess 05/24/2021   Elevated brain natriuretic peptide (BNP) level 05/22/2021   Overweight (BMI 25.0-29.9) 05/22/2021   Hypokalemia 05/21/2021   Fibromyalgia 10/12/2020   CKD stage G3a/A3, GFR 45-59 and albumin creatinine ratio >300 mg/g ([place])  10/12/2020   DKA (diabetic ketoacidosis) ([place]) 07/05/2020   SIRS (systemic inflammatory response syndrome) ([place]) 07/05/2020   Asthma 07/05/2020   AKI (acute kidney injury) ([place]) 07/05/2020   Transaminitis 07/05/2020   Hypertension    Gastroparesis due to secondary diabetes ([place])    Uncontrolled type 1 diabetes mellitus with hyperglycemia, with long-term current use of insulin ([place]) 06/19/2006   NEUROPATHY, PERIPHERAL 06/19/2006   ACNE 06/19/2006   Home Medication(s) Prior to Admission medications   Medication Sig Start Date End Date Taking? Authorizing Provider  BAQSIMI TWO PACK 3 MG/DOSE POWD Place 1 spray into both nostrils as needed (for a very low blood sugar emergency, when unable to eat or drink and need help from someone else- or as otherwise directed). 10/30/21   [provider]  DEXILANT 60 MG capsule Take 1 capsule (60 mg total) by mouth daily. 09/26/21   Regalado, Belkys A, MD  dicyclomine (BENTYL) 10 MG capsule Take 1 capsule (10 mg total) by mouth 4 (four) times daily -  before meals and at bedtime. 12/11/21   Samuella Cota, MD  famotidine (PEPCID) 40 MG tablet Take 40 mg by mouth 2 (two) times daily. 05/28/19   [provider]  gabapentin (NEURONTIN) 300 MG capsule Take 1 capsule (300 mg total) by mouth 3 (three) times daily. 05/28/21   Eugenie Filler, MD  granisetron (KYTRIL) 1 MG tablet Take 1 mg by mouth every 12 (twelve) hours. 11/05/21   [provider]  insulin glargine (LANTUS SOLOSTAR) 100 UNIT/ML Solostar Pen Inject 22 Units into the skin daily. Patient taking differently: Inject 7 Units into the skin at bedtime. 09/26/21  Regalado, Belkys A, MD  insulin glulisine (APIDRA SOLOSTAR) 100 UNIT/ML Solostar Pen Inject 0-8 Units into the skin See admin instructions. Inject 0-8 units into the skin daily AS NEEDED if blood glucose levels are trending upwards- changes as per carb intake 07/13/20   [provider]  lipase/protease/amylase (CREON)  36000 UNITS CPEP capsule Take 1 capsule (36,000 Units total) by mouth 3 (three) times daily with meals. 12/11/21   Samuella Cota, MD  loperamide (IMODIUM) 2 MG capsule Take 1 capsule (2 mg total) by mouth as needed for diarrhea or loose stools. 09/26/21   Regalado, Belkys A, MD  losartan (COZAAR) 25 MG tablet Take 25 mg by mouth daily.    [provider]  metoCLOPramide (REGLAN) 10 MG tablet Take 1 tablet (10 mg total) by mouth every 6 (six) hours as needed for nausea. 12/11/21 01/10/22  Samuella Cota, MD  mometasone-formoterol (DULERA) 100-5 MCG/ACT AERO Inhale 2 puffs into the lungs 2 (two) times daily. 07/07/20   Mercy Riding, MD  montelukast (SINGULAIR) 10 MG tablet Take 10 mg by mouth at bedtime. 05/24/19   [provider]  MOTEGRITY 1 MG TABS Take 1 mg by mouth daily. 08/31/21   [provider]  rosuvastatin (CRESTOR) 20 MG tablet Take 20 mg by mouth at bedtime. 05/24/19   [provider]  insulin aspart (NOVOLOG) 100 UNIT/ML injection Inject 3 units three times a day with meals if you eat over 50% of your meals. In addition, use sliding scale as follows: CBG 70 - 120: 0 units CBG 121 - 150: 2 units CBG 151 - 200: 3 units CBG 201 - 250: 5 units CBG 251 - 300: 8 units CBG 301 - 350: 11 units CBG 351 - 400: 15 units CBG > 400: call MD and obtain STAT lab verification Patient not taking: No sig reported 07/07/20 10/13/20  Mercy Riding, MD                                                                                                                                    Past Surgical History Past Surgical History:  Procedure Laterality Date   ESOPHAGOGASTRODUODENOSCOPY (EGD) WITH PROPOFOL N/A 09/24/2021   Procedure: ESOPHAGOGASTRODUODENOSCOPY (EGD) WITH PROPOFOL;  Surgeon: Clarene Essex, MD;  Location: [facility] ENDOSCOPY;  Service: Gastroenterology;  Laterality: N/A;   SAVORY DILATION N/A 09/24/2021   Procedure: SAVORY DILATION - 16 MM;  Surgeon: Clarene Essex, MD;  Location: [facility]  ENDOSCOPY;  Service: Gastroenterology;  Laterality: N/A;   Family History Family History  Problem Relation Age of Onset   Hypertension Mother    Dementia Mother    Heart attack Father    Hypertension Father    Heart disease Father    Hypertension Brother    Stroke Maternal Grandfather     Social History Social History   Tobacco Use   Smoking status: Former    Packs/day: 0.50  Types: Cigarettes   Smokeless tobacco: Never  Vaping Use   Vaping Use: Never used  Substance Use Topics   Alcohol use: No   Drug use: No   Allergies Patient has no known allergies.  Review of Systems Review of Systems  Constitutional:  Positive for fatigue.  Cardiovascular:  Positive for leg swelling.  Gastrointestinal:  Positive for abdominal pain and nausea.   *** Physical Exam Vital Signs  I have reviewed the triage vital signs Ht '5\' 4"'$  (1.626 m)   Wt 83.9 kg   BMI 31.76 kg/m  *** Physical Exam Vitals and nursing note reviewed.  Constitutional:      General: She is not in acute distress.    Appearance: She is well-developed.  HENT:     Head: Normocephalic and atraumatic.  Eyes:     Conjunctiva/sclera: Conjunctivae normal.  Cardiovascular:     Rate and Rhythm: Normal rate and regular rhythm.     Heart sounds: No murmur heard. Pulmonary:     Effort: Pulmonary effort is normal. No respiratory distress.     Breath sounds: Normal breath sounds.  Abdominal:     Palpations: Abdomen is soft.     Tenderness: There is no abdominal tenderness.  Musculoskeletal:        General: No swelling.     Cervical back: Neck supple.     Right lower leg: Edema present.     Left lower leg: Edema present.  Skin:    General: Skin is warm and dry.     Capillary Refill: Capillary refill takes less than 2 seconds.  Neurological:     Mental Status: She is alert.  Psychiatric:        Mood and Affect: Mood normal.     ED Results and Treatments Labs (all labs ordered are listed, but only  abnormal results are displayed) Labs Reviewed - No data to display                                                                                                                        Radiology No results found.  Pertinent labs & imaging results that were available during my care of the patient were reviewed by me and considered in my medical decision making (see MDM for details).  Medications Ordered in ED Medications - No data to display  Procedures Procedures  (including critical care time)  Medical Decision Making / ED Course   This patient presents to the ED for concern of ***, this involves an extensive number of treatment options, and is a complaint that carries with it a high risk of complications and morbidity.  The differential diagnosis includes ***  MDM: ***   Additional history obtained: -Additional history obtained from *** -External records from outside source obtained and reviewed including: Chart review including previous notes, labs, imaging, consultation notes   Lab Tests: -I ordered, reviewed, and interpreted labs.   The pertinent results include:   Labs Reviewed - No data to display    EKG ***  EKG Interpretation  Date/Time:    Ventricular Rate:    PR Interval:    QRS Duration:   QT Interval:    QTC Calculation:   R Axis:     Text Interpretation:           Imaging Studies ordered: I ordered imaging studies including *** I independently visualized and interpreted imaging. I agree with the radiologist interpretation   Medicines ordered and prescription drug management: No orders of the defined types were placed in this encounter.   -I have reviewed the patients home medicines and have made adjustments as needed  Critical interventions ***  Consultations Obtained: I requested consultation with the ***,   and discussed lab and imaging findings as well as pertinent plan - they recommend: ***   Cardiac Monitoring: The patient was maintained on a cardiac monitor.  I personally viewed and interpreted the cardiac monitored which showed an underlying rhythm of: ***  Social Determinants of Health:  Factors impacting patients care include: ***   Reevaluation: After the interventions noted above, I reevaluated the patient and found that they have :{resolved/improved/worsened:23923::"improved"}  Co morbidities that complicate the patient evaluation  Past Medical History:  Diagnosis Date   Diabetes mellitus    Gastroparesis    Hypertension    Neuropathy       Dispostion: I considered admission for this patient, ***     Final Clinical Impression(s) / ED Diagnoses Final diagnoses:  None     '@PCDICTATION'$ @

## 2021-12-11 NOTE — Progress Notes (Signed)
Date and time results received: 12/11/21 6:23 AM   Test: Potassium level Critical Value: 3.1  Name of Provider Notified: Stark Klein, NP  Orders Received? Or Actions Taken?: waiting for new order

## 2021-12-11 NOTE — Progress Notes (Signed)
Discharge instructions discussed with patient, verbalized agreement and understanding 

## 2021-12-11 NOTE — ED Triage Notes (Signed)
Arrives EMS from home with c/o weakness that began after being discharged from ICU today for DKA.   DMI, cbg [phone] by paramedics.

## 2021-12-12 ENCOUNTER — Observation Stay (HOSPITAL_COMMUNITY): Payer: Medicare Other

## 2021-12-12 DIAGNOSIS — E1010 Type 1 diabetes mellitus with ketoacidosis without coma: Secondary | ICD-10-CM | POA: Diagnosis not present

## 2021-12-12 LAB — CBC WITH DIFFERENTIAL/PLATELET
Abs Immature Granulocytes: 0.04 10*3/uL (ref 0.00–0.07)
Basophils Absolute: 0 10*3/uL (ref 0.0–0.1)
Basophils Relative: 0 %
Eosinophils Absolute: 0.1 10*3/uL (ref 0.0–0.5)
Eosinophils Relative: 1 %
HCT: 28.4 % — ABNORMAL LOW (ref 36.0–46.0)
Hemoglobin: 9.5 g/dL — ABNORMAL LOW (ref 12.0–15.0)
Immature Granulocytes: 1 %
Lymphocytes Relative: 22 %
Lymphs Abs: 1.6 10*3/uL (ref 0.7–4.0)
MCH: 35.1 pg — ABNORMAL HIGH (ref 26.0–34.0)
MCHC: 33.5 g/dL (ref 30.0–36.0)
MCV: 104.8 fL — ABNORMAL HIGH (ref 80.0–100.0)
Monocytes Absolute: 1.5 10*3/uL — ABNORMAL HIGH (ref 0.1–1.0)
Monocytes Relative: 20 %
Neutro Abs: 4.3 10*3/uL (ref 1.7–7.7)
Neutrophils Relative %: 56 %
Platelets: 136 10*3/uL — ABNORMAL LOW (ref 150–400)
RBC: 2.71 MIL/uL — ABNORMAL LOW (ref 3.87–5.11)
RDW: 20.6 % — ABNORMAL HIGH (ref 11.5–15.5)
WBC: 7.6 10*3/uL (ref 4.0–10.5)
nRBC: 0 % (ref 0.0–0.2)

## 2021-12-12 LAB — BASIC METABOLIC PANEL
Anion gap: 16 — ABNORMAL HIGH (ref 5–15)
Anion gap: 12 (ref 5–15)
Anion gap: 9 (ref 5–15)
Anion gap: 9 (ref 5–15)
Anion gap: 8 (ref 5–15)
BUN: 10 mg/dL (ref 6–20)
BUN: 10 mg/dL (ref 6–20)
BUN: 9 mg/dL (ref 6–20)
BUN: 9 mg/dL (ref 6–20)
BUN: 9 mg/dL (ref 6–20)
CO2: 14 mmol/L — ABNORMAL LOW (ref 22–32)
CO2: 16 mmol/L — ABNORMAL LOW (ref 22–32)
CO2: 17 mmol/L — ABNORMAL LOW (ref 22–32)
CO2: 18 mmol/L — ABNORMAL LOW (ref 22–32)
CO2: 19 mmol/L — ABNORMAL LOW (ref 22–32)
Calcium: 8.9 mg/dL (ref 8.9–10.3)
Calcium: 8.9 mg/dL (ref 8.9–10.3)
Calcium: 8.4 mg/dL — ABNORMAL LOW (ref 8.9–10.3)
Calcium: 8.9 mg/dL (ref 8.9–10.3)
Calcium: 9.5 mg/dL (ref 8.9–10.3)
Chloride: 107 mmol/L (ref 98–111)
Chloride: 109 mmol/L (ref 98–111)
Chloride: 110 mmol/L (ref 98–111)
Chloride: 112 mmol/L — ABNORMAL HIGH (ref 98–111)
Chloride: 113 mmol/L — ABNORMAL HIGH (ref 98–111)
Creatinine, Ser: 1.23 mg/dL — ABNORMAL HIGH (ref 0.44–1.00)
Creatinine, Ser: 1.09 mg/dL — ABNORMAL HIGH (ref 0.44–1.00)
Creatinine, Ser: 1.05 mg/dL — ABNORMAL HIGH (ref 0.44–1.00)
Creatinine, Ser: 1.04 mg/dL — ABNORMAL HIGH (ref 0.44–1.00)
Creatinine, Ser: 1.1 mg/dL — ABNORMAL HIGH (ref 0.44–1.00)
GFR, Estimated: 57 mL/min — ABNORMAL LOW (ref >60)
GFR, Estimated: >60 mL/min (ref >60)
GFR, Estimated: >60 mL/min (ref >60)
GFR, Estimated: >60 mL/min (ref >60)
GFR, Estimated: >60 mL/min (ref >60)
Glucose, Bld: 358 mg/dL — ABNORMAL HIGH (ref 70–99)
Glucose, Bld: 219 mg/dL — ABNORMAL HIGH (ref 70–99)
Glucose, Bld: 179 mg/dL — ABNORMAL HIGH (ref 70–99)
Glucose, Bld: 167 mg/dL — ABNORMAL HIGH (ref 70–99)
Glucose, Bld: 128 mg/dL — ABNORMAL HIGH (ref 70–99)
Potassium: 3.1 mmol/L — ABNORMAL LOW (ref 3.5–5.1)
Potassium: 2.9 mmol/L — ABNORMAL LOW (ref 3.5–5.1)
Potassium: 3.1 mmol/L — ABNORMAL LOW (ref 3.5–5.1)
Potassium: 3.5 mmol/L (ref 3.5–5.1)
Potassium: 3.6 mmol/L (ref 3.5–5.1)
Sodium: 137 mmol/L (ref 135–145)
Sodium: 137 mmol/L (ref 135–145)
Sodium: 136 mmol/L (ref 135–145)
Sodium: 139 mmol/L (ref 135–145)
Sodium: 140 mmol/L (ref 135–145)

## 2021-12-12 LAB — GLUCOSE, CAPILLARY
Glucose-Capillary: 178 mg/dL — ABNORMAL HIGH (ref 70–99)
Glucose-Capillary: 138 mg/dL — ABNORMAL HIGH (ref 70–99)
Glucose-Capillary: 156 mg/dL — ABNORMAL HIGH (ref 70–99)
Glucose-Capillary: 167 mg/dL — ABNORMAL HIGH (ref 70–99)
Glucose-Capillary: 156 mg/dL — ABNORMAL HIGH (ref 70–99)
Glucose-Capillary: 121 mg/dL — ABNORMAL HIGH (ref 70–99)
Glucose-Capillary: 152 mg/dL — ABNORMAL HIGH (ref 70–99)
Glucose-Capillary: 74 mg/dL (ref 70–99)

## 2021-12-12 LAB — CBC
HCT: 22.2 % — ABNORMAL LOW (ref 36.0–46.0)
Hemoglobin: 7.4 g/dL — ABNORMAL LOW (ref 12.0–15.0)
MCH: 35.4 pg — ABNORMAL HIGH (ref 26.0–34.0)
MCHC: 33.3 g/dL (ref 30.0–36.0)
MCV: 106.2 fL — ABNORMAL HIGH (ref 80.0–100.0)
Platelets: 99 10*3/uL — ABNORMAL LOW (ref 150–400)
RBC: 2.09 MIL/uL — ABNORMAL LOW (ref 3.87–5.11)
RDW: 20.4 % — ABNORMAL HIGH (ref 11.5–15.5)
WBC: 6.6 10*3/uL (ref 4.0–10.5)
nRBC: 0 % (ref 0.0–0.2)

## 2021-12-12 LAB — CBG MONITORING, ED
Glucose-Capillary: 384 mg/dL — ABNORMAL HIGH (ref 70–99)
Glucose-Capillary: 280 mg/dL — ABNORMAL HIGH (ref 70–99)
Glucose-Capillary: 214 mg/dL — ABNORMAL HIGH (ref 70–99)
Glucose-Capillary: 175 mg/dL — ABNORMAL HIGH (ref 70–99)
Glucose-Capillary: 191 mg/dL — ABNORMAL HIGH (ref 70–99)
Glucose-Capillary: 196 mg/dL — ABNORMAL HIGH (ref 70–99)

## 2021-12-12 LAB — COMPREHENSIVE METABOLIC PANEL
ALT: 70 U/L — ABNORMAL HIGH (ref 0–44)
AST: 137 U/L — ABNORMAL HIGH (ref 15–41)
Albumin: 2.9 g/dL — ABNORMAL LOW (ref 3.5–5.0)
Alkaline Phosphatase: 278 U/L — ABNORMAL HIGH (ref 38–126)
Anion gap: 17 — ABNORMAL HIGH (ref 5–15)
BUN: 9 mg/dL (ref 6–20)
CO2: 13 mmol/L — ABNORMAL LOW (ref 22–32)
Calcium: 9.2 mg/dL (ref 8.9–10.3)
Chloride: 103 mmol/L (ref 98–111)
Creatinine, Ser: 1.32 mg/dL — ABNORMAL HIGH (ref 0.44–1.00)
GFR, Estimated: 52 mL/min — ABNORMAL LOW (ref >60)
Glucose, Bld: 369 mg/dL — ABNORMAL HIGH (ref 70–99)
Potassium: 3.4 mmol/L — ABNORMAL LOW (ref 3.5–5.1)
Sodium: 133 mmol/L — ABNORMAL LOW (ref 135–145)
Total Bilirubin: 1.8 mg/dL — ABNORMAL HIGH (ref 0.3–1.2)
Total Protein: 6.8 g/dL (ref 6.5–8.1)

## 2021-12-12 LAB — MAGNESIUM: Magnesium: 1.8 mg/dL (ref 1.7–2.4)

## 2021-12-12 LAB — HEPATIC FUNCTION PANEL
ALT: 52 U/L — ABNORMAL HIGH (ref 0–44)
AST: 102 U/L — ABNORMAL HIGH (ref 15–41)
Albumin: 2.7 g/dL — ABNORMAL LOW (ref 3.5–5.0)
Alkaline Phosphatase: 202 U/L — ABNORMAL HIGH (ref 38–126)
Bilirubin, Direct: 0.7 mg/dL — ABNORMAL HIGH (ref 0.0–0.2)
Indirect Bilirubin: 0.9 mg/dL (ref 0.3–0.9)
Total Bilirubin: 1.6 mg/dL — ABNORMAL HIGH (ref 0.3–1.2)
Total Protein: 5.7 g/dL — ABNORMAL LOW (ref 6.5–8.1)

## 2021-12-12 LAB — BETA-HYDROXYBUTYRIC ACID: Beta-Hydroxybutyric Acid: 1.63 mmol/L — ABNORMAL HIGH (ref 0.05–0.27)

## 2021-12-12 LAB — I-STAT BETA HCG BLOOD, ED (MC, WL, AP ONLY): I-stat hCG, quantitative: <5 m[IU]/mL (ref <5)

## 2021-12-12 LAB — LIPASE, BLOOD: Lipase: 26 U/L (ref 11–51)

## 2021-12-12 MED ORDER — LACTATED RINGERS IV SOLN: INTRAVENOUS | Status: DC

## 2021-12-12 MED ORDER — MAGNESIUM OXIDE -MG SUPPLEMENT 400 (240 MG) MG PO TABS
800.0000 mg | ORAL_TABLET | Freq: Once | ORAL | Status: AC
  Administered 2021-12-12: 800 mg via ORAL
  Filled 2021-12-12: qty 2

## 2021-12-12 MED ORDER — POTASSIUM CHLORIDE CRYS ER 20 MEQ PO TBCR
60.0000 meq | EXTENDED_RELEASE_TABLET | Freq: Once | ORAL | Status: AC
  Administered 2021-12-12: 60 meq via ORAL
  Filled 2021-12-12: qty 3

## 2021-12-12 MED ORDER — INSULIN REGULAR(HUMAN) IN NACL 100-0.9 UT/100ML-% IV SOLN
INTRAVENOUS | Status: DC
  Administered 2021-12-12: 10 [IU]/h via INTRAVENOUS
  Filled 2021-12-12: qty 100

## 2021-12-12 MED ORDER — FAMOTIDINE 20 MG PO TABS
40.0000 mg | ORAL_TABLET | Freq: Two times a day (BID) | ORAL | Status: DC
  Administered 2021-12-12 – 2021-12-18 (×13): 40 mg via ORAL
  Filled 2021-12-12 (×15): qty 2

## 2021-12-12 MED ORDER — POTASSIUM CHLORIDE 2 MEQ/ML IV SOLN
INTRAVENOUS | Status: DC
  Filled 2021-12-12 (×2): qty 1000

## 2021-12-12 MED ORDER — DICYCLOMINE HCL 10 MG PO CAPS
10.0000 mg | ORAL_CAPSULE | Freq: Three times a day (TID) | ORAL | Status: DC
  Administered 2021-12-12 – 2021-12-18 (×27): 10 mg via ORAL
  Filled 2021-12-12 (×27): qty 1

## 2021-12-12 MED ORDER — LACTATED RINGERS IV BOLUS
500.0000 mL | Freq: Once | INTRAVENOUS | Status: AC
  Administered 2021-12-12: 500 mL via INTRAVENOUS

## 2021-12-12 MED ORDER — ALBUMIN HUMAN 25 % IV SOLN
25.0000 g | Freq: Once | INTRAVENOUS | Status: AC
  Administered 2021-12-12: 25 g via INTRAVENOUS
  Filled 2021-12-12: qty 100

## 2021-12-12 MED ORDER — POTASSIUM CHLORIDE CRYS ER 20 MEQ PO TBCR
40.0000 meq | EXTENDED_RELEASE_TABLET | Freq: Once | ORAL | Status: AC
Start: 2021-12-12 — End: 2021-12-12
  Administered 2021-12-12: 40 meq via ORAL
  Filled 2021-12-12: qty 2

## 2021-12-12 MED ORDER — DEXTROSE IN LACTATED RINGERS 5 % IV SOLN: INTRAVENOUS | Status: AC

## 2021-12-12 MED ORDER — ROSUVASTATIN CALCIUM 10 MG PO TABS
20.0000 mg | ORAL_TABLET | Freq: Every day | ORAL | Status: DC
  Administered 2021-12-12 – 2021-12-17 (×6): 20 mg via ORAL
  Filled 2021-12-12: qty 1
  Filled 2021-12-12 (×3): qty 2
  Filled 2021-12-12: qty 1
  Filled 2021-12-12: qty 2

## 2021-12-12 MED ORDER — METOCLOPRAMIDE HCL 5 MG PO TABS
10.0000 mg | ORAL_TABLET | Freq: Four times a day (QID) | ORAL | Status: DC | PRN
  Administered 2021-12-12: 10 mg via ORAL
  Filled 2021-12-12: qty 2

## 2021-12-12 MED ORDER — DEXTROSE 50 % IV SOLN
0.0000 mL | INTRAVENOUS | Status: DC | PRN
  Administered 2021-12-15: 25 mL via INTRAVENOUS
  Filled 2021-12-12 (×2): qty 50

## 2021-12-12 MED ORDER — GABAPENTIN 100 MG PO CAPS
100.0000 mg | ORAL_CAPSULE | Freq: Once | ORAL | Status: AC
  Administered 2021-12-12: 100 mg via ORAL
  Filled 2021-12-12: qty 1

## 2021-12-12 MED ORDER — INSULIN ASPART 100 UNIT/ML IJ SOLN
0.0000 [IU] | Freq: Every day | INTRAMUSCULAR | Status: DC
  Administered 2021-12-13: 2 [IU] via SUBCUTANEOUS

## 2021-12-12 MED ORDER — ORAL CARE MOUTH RINSE
15.0000 mL | OROMUCOSAL | Status: DC | PRN
Start: 2021-12-12 — End: 2021-12-18

## 2021-12-12 MED ORDER — INSULIN GLARGINE-YFGN 100 UNIT/ML ~~LOC~~ SOLN
25.0000 [IU] | Freq: Every day | SUBCUTANEOUS | Status: DC
  Administered 2021-12-12 – 2021-12-15 (×4): 25 [IU] via SUBCUTANEOUS
  Filled 2021-12-12 (×5): qty 0.25

## 2021-12-12 MED ORDER — PANCRELIPASE (LIP-PROT-AMYL) 12000-38000 UNITS PO CPEP
36000.0000 [IU] | ORAL_CAPSULE | Freq: Three times a day (TID) | ORAL | Status: DC
  Administered 2021-12-13 – 2021-12-18 (×18): 36000 [IU] via ORAL
  Filled 2021-12-12: qty 1
  Filled 2021-12-12: qty 3
  Filled 2021-12-12: qty 1
  Filled 2021-12-12 (×2): qty 3
  Filled 2021-12-12 (×2): qty 1
  Filled 2021-12-12 (×3): qty 3
  Filled 2021-12-12: qty 1
  Filled 2021-12-12 (×8): qty 3
  Filled 2021-12-12 (×2): qty 1
  Filled 2021-12-12: qty 3

## 2021-12-12 MED ORDER — MONTELUKAST SODIUM 10 MG PO TABS
10.0000 mg | ORAL_TABLET | Freq: Every day | ORAL | Status: DC
  Administered 2021-12-12 – 2021-12-17 (×6): 10 mg via ORAL
  Filled 2021-12-12 (×6): qty 1

## 2021-12-12 MED ORDER — CHLORHEXIDINE GLUCONATE CLOTH 2 % EX PADS
6.0000 | MEDICATED_PAD | Freq: Every day | CUTANEOUS | Status: DC
  Administered 2021-12-12 – 2021-12-13 (×2): 6 via TOPICAL

## 2021-12-12 MED ORDER — INSULIN ASPART 100 UNIT/ML IV SOLN
10.0000 [IU] | Freq: Once | INTRAVENOUS | Status: DC
  Filled 2021-12-12: qty 0.1

## 2021-12-12 MED ORDER — PANTOPRAZOLE SODIUM 40 MG PO TBEC
40.0000 mg | DELAYED_RELEASE_TABLET | Freq: Every day | ORAL | Status: DC
  Administered 2021-12-12 – 2021-12-18 (×7): 40 mg via ORAL
  Filled 2021-12-12 (×8): qty 1

## 2021-12-12 MED ORDER — INSULIN ASPART 100 UNIT/ML IJ SOLN
0.0000 [IU] | Freq: Three times a day (TID) | INTRAMUSCULAR | Status: DC
  Administered 2021-12-12: 4 [IU] via SUBCUTANEOUS
  Administered 2021-12-13 (×2): 3 [IU] via SUBCUTANEOUS
  Administered 2021-12-14 (×2): 4 [IU] via SUBCUTANEOUS
  Administered 2021-12-15: 3 [IU] via SUBCUTANEOUS
  Administered 2021-12-15: 4 [IU] via SUBCUTANEOUS

## 2021-12-12 MED ORDER — SODIUM CHLORIDE 0.9 % IV SOLN
8.0000 mg | Freq: Once | INTRAVENOUS | Status: AC
  Administered 2021-12-12: 8 mg via INTRAVENOUS
  Filled 2021-12-12: qty 4

## 2021-12-12 MED ORDER — GABAPENTIN 300 MG PO CAPS
300.0000 mg | ORAL_CAPSULE | Freq: Three times a day (TID) | ORAL | Status: DC
Start: 2021-12-12 — End: 2021-12-17
  Administered 2021-12-12 – 2021-12-17 (×16): 300 mg via ORAL
  Filled 2021-12-12 (×16): qty 1

## 2021-12-12 MED ORDER — MOMETASONE FURO-FORMOTEROL FUM 100-5 MCG/ACT IN AERO
2.0000 | INHALATION_SPRAY | Freq: Two times a day (BID) | RESPIRATORY_TRACT | Status: DC
Start: 2021-12-12 — End: 2021-12-18
  Administered 2021-12-12 – 2021-12-18 (×13): 2 via RESPIRATORY_TRACT
  Filled 2021-12-12: qty 8.8

## 2021-12-12 MED ORDER — POTASSIUM CHLORIDE 10 MEQ/100ML IV SOLN
10.0000 meq | INTRAVENOUS | Status: AC
  Administered 2021-12-12 (×4): 10 meq via INTRAVENOUS
  Filled 2021-12-12 (×4): qty 100

## 2021-12-12 NOTE — Inpatient Diabetes Management (Signed)
Inpatient Diabetes Program Recommendations  AACE/ADA: New Consensus Statement on Inpatient Glycemic Control (2015)  Target Ranges:  Prepandial:   less than 140 mg/dL      Peak postprandial:   less than 180 mg/dL (1-2 hours)      Critically ill patients:  140 - 180 mg/dL   Lab Results  Component Value Date   GLUCAP 156 (H) 12/12/2021   HGBA1C 7.6 (H) 11/18/2021    Review of Glycemic Control  Diabetes history: DM1 Outpatient Diabetes medications: Lantus 7 units QHS, Apidra 0-8 TID Current orders for Inpatient glycemic control: IV insulin per [facility] for DKA  She was just discharged from here on 8/22 after being managed for DKA, nausea, abdominal pain.  HgbA1C - 7.6%, down from 9.6% on 05/21/21  Has been seen multiple times by several Diabetes Coordinators.  Inpatient Diabetes Program Recommendations:    Continue IV insulin until criteria met for discontinuation. Give basal insulin 1-2H prior to discontinuation of drip.  Semglee 8 units BID. Titrate (Pt was discharged on 12/11/21 on Lantus 22 units QD) Novolog 0-9 units Q4H  When eating, will need meal coverage insulin since Type 1 and makes no insulin.  See notes dated 8/16 and 8/18 during previous admission.  Follow.  Thank you. Lorenda Peck, RD, LDN, [facility] Diabetes Coordinator [phone]

## 2021-12-12 NOTE — H&P (Signed)
History and Physical    NESHIA MCKENZIE VVO:[id number] DOB: 1980/07/24 DOA: 12/11/2021  PCP: [name], MD  Patient coming from: Home  Chief Complaint: Fatigue  HPI: RASCHELLE Garcia is a 41 y.o. female with medical history significant of uncontrolled type 1 diabetes with diabetic gastroparesis, frequent and recurrent refractory nausea and vomiting and metabolic derangements, neuropathy, GERD, chronic pain syndrome and fibromyalgia, hypertension, chronic urinary retention does self-catheterization at home, chronic macrocytic anemia.  Discharged from the hospital yesterday after being treated for severe DKA and nausea/abdominal pain secondary to diabetic gastroparesis.  GI recommended Creon, Reglan, Bentyl, and outpatient follow-up with [facility].  Patient return to the ED later in the evening complaining of generalized weakness and bilateral lower extremity edema.  Tachycardic in the ED but afebrile.  Labs showing WBC 7.6, hemoglobin 9.5, MCV 104.8, platelet count 136k, sodium 133, potassium 3.4, glucose 369, bicarb 13, anion gap 17, UA pending, beta hydroxybutyrate acid 1.63, VBG with normal pH, creatinine 1.3 (baseline 0.8), albumin 2.9, AST 137, ALT 70, alkaline phosphatase 278, T. bili 1.8, lipase normal. Patient was given magnesium, potassium, albumin, 500 cc LR bolus, Zofran, and started on IV insulin per DKA protocol.  Patient states she was discharged from the hospital yesterday and returned back to the emergency room in the evening due to fatigue and difficulty walking.  She is reporting difficulty walking due to significant bilateral lower extremity edema.  Denies shortness of breath or chest pain.  States she was discharged on Lantus 22 units daily and sliding scale insulin with meals.  She reports ongoing generalized abdominal pain for over a month and does not think it has changed.  She is reporting nausea but no longer vomiting.  Reports having a normal bowel movement tonight.  Denies  fevers.  Review of Systems:  Review of Systems  All other systems reviewed and are negative.   Past Medical History:  Diagnosis Date   Diabetes mellitus    Gastroparesis    Hypertension    Neuropathy     Past Surgical History:  Procedure Laterality Date   ESOPHAGOGASTRODUODENOSCOPY (EGD) WITH PROPOFOL N/A 09/24/2021   Procedure: ESOPHAGOGASTRODUODENOSCOPY (EGD) WITH PROPOFOL;  Surgeon: Clarene Essex, MD;  Location: [facility];  Service: Gastroenterology;  Laterality: N/A;   SAVORY DILATION N/A 09/24/2021   Procedure: SAVORY DILATION - 16 MM;  Surgeon: Clarene Essex, MD;  Location: [facility];  Service: Gastroenterology;  Laterality: N/A;     reports that she has quit smoking. Her smoking use included cigarettes. She smoked an average of .5 packs per day. She has never used smokeless tobacco. She reports that she does not drink alcohol and does not use drugs.  No Known Allergies  Family History  Problem Relation Age of Onset   Hypertension Mother    Dementia Mother    Heart attack Father    Hypertension Father    Heart disease Father    Hypertension Brother    Stroke Maternal Grandfather     Prior to Admission medications   Medication Sig Start Date End Date Taking? Authorizing Provider  BAQSIMI TWO PACK 3 MG/DOSE POWD Place 1 spray into both nostrils as needed (for a very low blood sugar emergency, when unable to eat or drink and need help from someone else- or as otherwise directed). 10/30/21   [provider]  DEXILANT 60 MG capsule Take 1 capsule (60 mg total) by mouth daily. 09/26/21   Regalado, Cassie Freer, MD  dicyclomine (BENTYL) 10  MG capsule Take 1 capsule (10 mg total) by mouth 4 (four) times daily -  before meals and at bedtime. 12/11/21   Samuella Cota, MD  famotidine (PEPCID) 40 MG tablet Take 40 mg by mouth 2 (two) times daily. 05/28/19   [provider]  gabapentin (NEURONTIN) 300 MG capsule Take 1 capsule (300 mg total) by mouth 3 (three) times  daily. 05/28/21   Eugenie Filler, MD  granisetron (KYTRIL) 1 MG tablet Take 1 mg by mouth every 12 (twelve) hours. 11/05/21   [provider]  insulin glargine (LANTUS SOLOSTAR) 100 UNIT/ML Solostar Pen Inject 22 Units into the skin daily. Patient taking differently: Inject 7 Units into the skin at bedtime. 09/26/21   Regalado, Belkys A, MD  insulin glulisine (APIDRA SOLOSTAR) 100 UNIT/ML Solostar Pen Inject 0-8 Units into the skin See admin instructions. Inject 0-8 units into the skin daily AS NEEDED if blood glucose levels are trending upwards- changes as per carb intake 07/13/20   [provider]  lipase/protease/amylase (CREON) 36000 UNITS CPEP capsule Take 1 capsule (36,000 Units total) by mouth 3 (three) times daily with meals. 12/11/21   Samuella Cota, MD  loperamide (IMODIUM) 2 MG capsule Take 1 capsule (2 mg total) by mouth as needed for diarrhea or loose stools. 09/26/21   Regalado, Belkys A, MD  losartan (COZAAR) 25 MG tablet Take 25 mg by mouth daily.    [provider]  metoCLOPramide (REGLAN) 10 MG tablet Take 1 tablet (10 mg total) by mouth every 6 (six) hours as needed for nausea. 12/11/21 01/10/22  Samuella Cota, MD  mometasone-formoterol (DULERA) 100-5 MCG/ACT AERO Inhale 2 puffs into the lungs 2 (two) times daily. 07/07/20   Mercy Riding, MD  montelukast (SINGULAIR) 10 MG tablet Take 10 mg by mouth at bedtime. 05/24/19   [provider]  MOTEGRITY 1 MG TABS Take 1 mg by mouth daily. 08/31/21   [provider]  rosuvastatin (CRESTOR) 20 MG tablet Take 20 mg by mouth at bedtime. 05/24/19   [provider]  insulin aspart (NOVOLOG) 100 UNIT/ML injection Inject 3 units three times a day with meals if you eat over 50% of your meals. In addition, use sliding scale as follows: CBG 70 - 120: 0 units CBG 121 - 150: 2 units CBG 151 - 200: 3 units CBG 201 - 250: 5 units CBG 251 - 300: 8 units CBG 301 - 350: 11 units CBG 351 - 400: 15 units CBG  > 400: call MD and obtain STAT lab verification Patient not taking: No sig reported 07/07/20 10/13/20  Mercy Riding, MD    Physical Exam: Vitals:   12/12/21 0015 12/12/21 0030 12/12/21 0045 12/12/21 0145  BP: 113/73 109/83 122/85 105/70  Pulse: (!) 103 (!) 114 (!) 116 (!) 107  Resp: 17 19 (!) 21 17  Temp:      TempSrc:      SpO2: 100% 99% 100% 98%  Weight:      Height:        Physical Exam Vitals reviewed.  Constitutional:      General: She is not in acute distress. HENT:     Head: Normocephalic and atraumatic.  Eyes:     Extraocular Movements: Extraocular movements intact.  Cardiovascular:     Rate and Rhythm: Normal rate and regular rhythm.     Pulses: Normal pulses.  Pulmonary:     Effort: Pulmonary effort is normal. No respiratory distress.  Breath sounds: Normal breath sounds.  Abdominal:     General: Bowel sounds are normal. There is no distension.     Palpations: Abdomen is soft.     Tenderness: There is no guarding or rebound.     Comments: Mild generalized tenderness to palpation  Musculoskeletal:     Cervical back: Normal range of motion.     Right lower leg: Edema present.     Left lower leg: Edema present.     Comments: +4 pitting edema of bilateral lower extremities  Skin:    General: Skin is warm and dry.  Neurological:     General: No focal deficit present.     Mental Status: She is alert and oriented to person, place, and time.      Labs on Admission: I have personally reviewed following labs and imaging studies  CBC: Recent Labs  Lab 12/11/21 2319  WBC 7.6  NEUTROABS 4.3  HGB 9.5*  HCT 28.4*  MCV 104.8*  PLT 676*   Basic Metabolic Panel: Recent Labs  Lab 12/07/21 0830 12/07/21 1751 12/08/21 1118 12/08/21 2208 12/09/21 0609 12/10/21 0559 12/11/21 0430 12/11/21 2319 12/12/21 0221  NA 132*  --  134*  --  133* 133* 133* 133* 137  K 3.7  --  4.1  --  5.0 4.4 3.1* 3.4* 3.1*  CL 110  --  110  --  110 108 105 103 107  CO2 16*  --   16*  --  15* 14* 17* 13* 14*  GLUCOSE 333*  --  377*  --  377* 300* 361* 369* 358*  BUN <5*  --  <5*  --  <5* '8 10 9 10  '$ CREATININE 0.91  --  0.89  --  1.11* 1.16* 1.33* 1.32* 1.23*  CALCIUM 7.6*  --  8.1*  --  8.2* 8.6* 8.7* 9.2 8.9  MG 1.9  --  1.8  --  2.0 1.9 1.8  --   --   PHOS <1.0*   < > 1.7* 3.2 2.9 2.8 2.5  --   --    < > = values in this interval not displayed.   GFR: Estimated Creatinine Clearance: 63.7 mL/min (A) (by C-G formula based on SCr of 1.23 mg/dL (H)). Liver Function Tests: Recent Labs  Lab 12/11/21 2319  AST 137*  ALT 70*  ALKPHOS 278*  BILITOT 1.8*  PROT 6.8  ALBUMIN 2.9*   Recent Labs  Lab 12/11/21 2319  LIPASE 26   No results for input(s): "AMMONIA" in the last 168 hours. Coagulation Profile: No results for input(s): "INR", "PROTIME" in the last 168 hours. Cardiac Enzymes: No results for input(s): "CKTOTAL", "CKMB", "CKMBINDEX", "TROPONINI" in the last 168 hours. BNP (last 3 results) No results for input(s): "PROBNP" in the last 8760 hours. HbA1C: No results for input(s): "HGBA1C" in the last 72 hours. CBG: Recent Labs  Lab 12/11/21 0716 12/11/21 1135 12/11/21 2320 12/12/21 0121 12/12/21 0327  GLUCAP 349* 394* 374* 384* 280*   Lipid Profile: No results for input(s): "CHOL", "HDL", "LDLCALC", "TRIG", "CHOLHDL", "LDLDIRECT" in the last 72 hours. Thyroid Function Tests: No results for input(s): "TSH", "T4TOTAL", "FREET4", "T3FREE", "THYROIDAB" in the last 72 hours. Anemia Panel: No results for input(s): "VITAMINB12", "FOLATE", "FERRITIN", "TIBC", "IRON", "RETICCTPCT" in the last 72 hours. Urine analysis:    Component Value Date/Time   COLORURINE YELLOW 11/19/2021 0138   APPEARANCEUR HAZY (A) 11/19/2021 0138   LABSPEC 1.001 (L) 11/19/2021 0138   PHURINE 7.0 11/19/2021 0138  GLUCOSEU >=500 (A) 11/19/2021 0138   HGBUR MODERATE (A) 11/19/2021 0138   BILIRUBINUR NEGATIVE 11/19/2021 0138   KETONESUR 5 (A) 11/19/2021 0138   PROTEINUR 30  (A) 11/19/2021 0138   UROBILINOGEN 0.2 04/11/2011 1437   NITRITE NEGATIVE 11/19/2021 0138   LEUKOCYTESUR NEGATIVE 11/19/2021 0138    Radiological Exams on Admission: I have personally reviewed images No results found.  EKG: Independently reviewed.  Sinus tachycardia, borderline QT prolongation.  No significant change since prior tracing.  Assessment and Plan  DKA, type I Patient recently admitted for severe DKA and discharged yesterday.  Presents with glucose in the 300s, bicarb 13, anion gap 17, beta hydroxybutyrate acid 1.63, VBG with normal pH.  A1c 7.6 on 11/18/2021. -Continue IV insulin and IV fluids per DKA protocol -BMP every 4 hours -Frequent CBG checks -Consult diabetes coordinator  AKI Likely prerenal from dehydration.  Creatinine 1.3, baseline 0.8. -IV fluid hydration -Monitor renal function -Avoid nephrotoxic agents/hold home losartan  Bilateral lower extremity edema secondary to volume resuscitation Echo done in January 2023 showing EF 65 to 70% and normal diastolic parameters.  Lungs clear on exam. -Will need diuresis after AKI resolves.  Diabetic gastroparesis, nausea, abdominal pain Elevated liver enzymes Seen by GI and discharged from the hospital yesterday.  Complaining of generalized abdominal pain ongoing for over a month and nausea.  She is no longer vomiting. AST 137, ALT 70, alkaline phosphatase 278, T. bili 1.8, lipase normal.  -Right upper quadrant ultrasound -Continue Reglan, Bentyl  GERD -Continue PPI/H2 blocker  Hypokalemia Borderline QT prolongation -Requiring QT prolonging drugs for GI symptoms -Keep potassium above 4 and magnesium above 2 -Monitor electrolytes closely  Chronic pain syndrome/fibromyalgia -Continue gabapentin  Hyperlipidemia -Continue Crestor  Hypertension -Hold losartan given AKI and soft blood pressure  Chronic urinary retention Does self-catheterization at home. -In and out cath as needed  Chronic macrocytic  anemia -Continue to monitor  DVT prophylaxis: SCDs Code Status: Full Code Family Communication: Discussed with the patient, no family available at this time. Level of care: Step Down Unit Admission status: It is my clinical opinion that referral for OBSERVATION is reasonable and necessary in this patient based on the above information provided. The aforementioned taken together are felt to place the patient at high risk for further clinical deterioration. However, it is anticipated that the patient may be medically stable for discharge from the hospital within 24 to 48 hours.   Shela Leff MD [facility]  If 7PM-7AM, please contact night-coverage [email or web address]  12/12/2021, 3:55 AM

## 2021-12-12 NOTE — Progress Notes (Signed)
During routine care, the patient stated she would like to discuss possibilities of home healthcare when she is discharged since she feels she is unable to care for herself when she goes home and has no family/friends to assist her (patient stated this is one reason why she believes she was readmitted). This nurse will inform the oncoming day shift of this and request case management discuss with the patient.

## 2021-12-12 NOTE — ED Notes (Signed)
Pt placed in gown.

## 2021-12-12 NOTE — Progress Notes (Signed)
Requested PRN pain medication from evening provider Lennox Grumbles, NP) as patient is in 9/10 pain from bilateral lower extremities and abd (not new and unchanged from previous shift).

## 2021-12-12 NOTE — Progress Notes (Addendum)
Brief same days note:  Patient is a 41 year old female with history of uncontrolled type 1 diabetes, diabetic gastroparesis, chronic pain syndrome, fibromyalgia, hypertension, chronic urinary retention with self cath at home who presents here with complaint of fatigue, difficulty walking.  She was just discharged from here on 8/22 after being managed for DKA, nausea, abdominal pain. On presentation, she was hyperglycemic.  Lab work showed anion gap of 17, bicarb of 13.  Started on IV insulin and DKA protocol.  We will continue current management. Agree with assessment and plan of Dr. Marlowe Sax

## 2021-12-13 ENCOUNTER — Ambulatory Visit (HOSPITAL_COMMUNITY): Payer: Medicare Other | Attending: Internal Medicine

## 2021-12-13 DIAGNOSIS — N179 Acute kidney failure, unspecified: Secondary | ICD-10-CM | POA: Diagnosis present

## 2021-12-13 DIAGNOSIS — R609 Edema, unspecified: Secondary | ICD-10-CM

## 2021-12-13 DIAGNOSIS — M797 Fibromyalgia: Secondary | ICD-10-CM | POA: Diagnosis present

## 2021-12-13 DIAGNOSIS — E1010 Type 1 diabetes mellitus with ketoacidosis without coma: Secondary | ICD-10-CM | POA: Diagnosis present

## 2021-12-13 DIAGNOSIS — E860 Dehydration: Secondary | ICD-10-CM | POA: Diagnosis present

## 2021-12-13 DIAGNOSIS — I10 Essential (primary) hypertension: Secondary | ICD-10-CM | POA: Diagnosis present

## 2021-12-13 DIAGNOSIS — E1043 Type 1 diabetes mellitus with diabetic autonomic (poly)neuropathy: Secondary | ICD-10-CM | POA: Diagnosis present

## 2021-12-13 DIAGNOSIS — I5031 Acute diastolic (congestive) heart failure: Secondary | ICD-10-CM | POA: Diagnosis not present

## 2021-12-13 DIAGNOSIS — T503X5A Adverse effect of electrolytic, caloric and water-balance agents, initial encounter: Secondary | ICD-10-CM | POA: Diagnosis present

## 2021-12-13 DIAGNOSIS — M7989 Other specified soft tissue disorders: Secondary | ICD-10-CM | POA: Diagnosis present

## 2021-12-13 DIAGNOSIS — E669 Obesity, unspecified: Secondary | ICD-10-CM | POA: Diagnosis present

## 2021-12-13 DIAGNOSIS — E1042 Type 1 diabetes mellitus with diabetic polyneuropathy: Secondary | ICD-10-CM | POA: Diagnosis present

## 2021-12-13 DIAGNOSIS — E8779 Other fluid overload: Secondary | ICD-10-CM | POA: Diagnosis present

## 2021-12-13 DIAGNOSIS — I959 Hypotension, unspecified: Secondary | ICD-10-CM | POA: Diagnosis present

## 2021-12-13 DIAGNOSIS — E876 Hypokalemia: Secondary | ICD-10-CM | POA: Diagnosis present

## 2021-12-13 DIAGNOSIS — R9431 Abnormal electrocardiogram [ECG] [EKG]: Secondary | ICD-10-CM | POA: Diagnosis present

## 2021-12-13 DIAGNOSIS — R339 Retention of urine, unspecified: Secondary | ICD-10-CM | POA: Diagnosis present

## 2021-12-13 DIAGNOSIS — Z794 Long term (current) use of insulin: Secondary | ICD-10-CM | POA: Diagnosis not present

## 2021-12-13 DIAGNOSIS — K760 Fatty (change of) liver, not elsewhere classified: Secondary | ICD-10-CM | POA: Diagnosis present

## 2021-12-13 DIAGNOSIS — F323 Major depressive disorder, single episode, severe with psychotic features: Secondary | ICD-10-CM | POA: Diagnosis not present

## 2021-12-13 DIAGNOSIS — K3184 Gastroparesis: Secondary | ICD-10-CM | POA: Diagnosis present

## 2021-12-13 DIAGNOSIS — D6959 Other secondary thrombocytopenia: Secondary | ICD-10-CM | POA: Diagnosis present

## 2021-12-13 DIAGNOSIS — G894 Chronic pain syndrome: Secondary | ICD-10-CM | POA: Diagnosis present

## 2021-12-13 DIAGNOSIS — E785 Hyperlipidemia, unspecified: Secondary | ICD-10-CM | POA: Diagnosis present

## 2021-12-13 DIAGNOSIS — D539 Nutritional anemia, unspecified: Secondary | ICD-10-CM | POA: Diagnosis present

## 2021-12-13 DIAGNOSIS — F05 Delirium due to known physiological condition: Secondary | ICD-10-CM | POA: Diagnosis not present

## 2021-12-13 DIAGNOSIS — K7460 Unspecified cirrhosis of liver: Secondary | ICD-10-CM | POA: Diagnosis present

## 2021-12-13 LAB — BASIC METABOLIC PANEL
Anion gap: 6 (ref 5–15)
BUN: 7 mg/dL (ref 6–20)
CO2: 19 mmol/L — ABNORMAL LOW (ref 22–32)
Calcium: 8.4 mg/dL — ABNORMAL LOW (ref 8.9–10.3)
Chloride: 110 mmol/L (ref 98–111)
Creatinine, Ser: 0.97 mg/dL (ref 0.44–1.00)
GFR, Estimated: >60 mL/min (ref >60)
Glucose, Bld: 153 mg/dL — ABNORMAL HIGH (ref 70–99)
Potassium: 3.6 mmol/L (ref 3.5–5.1)
Sodium: 135 mmol/L (ref 135–145)

## 2021-12-13 LAB — BRAIN NATRIURETIC PEPTIDE: B Natriuretic Peptide: 58.6 pg/mL (ref 0.0–100.0)

## 2021-12-13 LAB — GLUCOSE, CAPILLARY
Glucose-Capillary: 138 mg/dL — ABNORMAL HIGH (ref 70–99)
Glucose-Capillary: 137 mg/dL — ABNORMAL HIGH (ref 70–99)
Glucose-Capillary: 126 mg/dL — ABNORMAL HIGH (ref 70–99)
Glucose-Capillary: 105 mg/dL — ABNORMAL HIGH (ref 70–99)
Glucose-Capillary: 207 mg/dL — ABNORMAL HIGH (ref 70–99)

## 2021-12-13 MED ORDER — OXYCODONE HCL 5 MG PO TABS
5.0000 mg | ORAL_TABLET | ORAL | Status: DC | PRN
  Administered 2021-12-13: 5 mg via ORAL
  Filled 2021-12-13 (×2): qty 1

## 2021-12-13 MED ORDER — OXYCODONE HCL 5 MG PO TABS
5.0000 mg | ORAL_TABLET | Freq: Once | ORAL | Status: AC
  Administered 2021-12-13: 5 mg via ORAL
  Filled 2021-12-13: qty 1

## 2021-12-13 MED ORDER — FUROSEMIDE 10 MG/ML IJ SOLN
40.0000 mg | Freq: Two times a day (BID) | INTRAMUSCULAR | Status: AC
  Administered 2021-12-13 – 2021-12-14 (×2): 40 mg via INTRAVENOUS
  Filled 2021-12-13 (×2): qty 4

## 2021-12-13 MED ORDER — ACETAMINOPHEN 325 MG PO TABS
650.0000 mg | ORAL_TABLET | ORAL | Status: AC | PRN
  Administered 2021-12-14 – 2021-12-16 (×2): 650 mg via ORAL
  Filled 2021-12-13 (×2): qty 2

## 2021-12-13 NOTE — Progress Notes (Signed)
Reached out to the provider Lennox Grumbles, NP) to inquire whether this patient may benefit from albumin for her soft blood pressures, increasing bilateral lower edema, and low albumin (2.7 as of 12/12/21). The patient also complains of abd pain and would like a heating pad; requested order for k-pad from provider.

## 2021-12-13 NOTE — Progress Notes (Signed)
PROGRESS NOTE  Anna Garcia  KDX:[id number] DOB: [birth year] DOA: 12/11/2021 PCP: [name], MD   Brief Narrative: Patient is a 41 year old female with history of uncontrolled type 1 diabetes, diabetic gastroparesis, chronic pain syndrome, fibromyalgia, hypertension, chronic urinary retention with self cath at home who presents here with complaint of fatigue, difficulty walking.  She was just discharged from here on 8/22 after being managed for DKA, nausea, abdominal pain. On presentation, she was hyperglycemic.  Lab work showed anion gap of 17, bicarb of 13.  Started on IV insulin and DKA protocol.  Her gap has closed, she has been transitioned to long-acting and sliding scale insulin.  Hospital course remarkable for persistent sinus tachycardia, severe volume overload, lower extremity edema.  Being given Lasix today.  Assessment & Plan:  Principal Problem:   DKA (diabetic ketoacidosis) ([place]) Active Problems:   Gastroparesis due to secondary diabetes ([place])   Hypokalemia   GERD (gastroesophageal reflux disease)   AKI (acute kidney injury) ([place])   Hypertension   Macrocytic anemia   Chronic pain syndrome  DKA/diabetes type 1: Recently admitted for the same.  Hemoglobin A1c of 7.6 as per 11/18/2021.  Initially started on IV insulin.  Gap is closed.  Long-acting insulin.  Monitor blood sugars.  She follows with endocrinology  AKI: Resolved with IV fluids  Severe volume overload/bilateral lower extremity edema: Bilateral lower extremity pitting edema.  It happens when she gets IV fluids.  Last echo done on 1/23 showed EF of 65 to 97%, normal diastolic parameters.  BNP normal. We will give her 2 doses of Lasix 40 mg IV twice.  Will be careful with blood pressure  Elevated liver enzymes: Ultrasound of the abdomen did not show any cholelithiasis or choledocholithiasis.  But showed changes of liver cirrhosis with hepatic steatosis.  Diabetic gastroparesis/nausea/abdominal pain: Abdominal pain,  nausea or vomiting resolved.  Liver enzymes have been stable.  Continue Reglan, Bentyl  Hypokalemia: Supplemented with potassium and being monitored  GERD: Continue PPI  Hyperlipidemia: Continue Crestor  Hypertension: Blood pressure soft.  Losartan on hold  Chronic urinary retention: Does self cath at home.  Chronic macrocytic anemia: Currently hemoglobin in the range of 7-8.  Hemoglobin has ranged from 7 to 11 in the last several weeks.  Also has chronic thrombocytopenia, likely from liver cirrhosis.  Vitamin Q73 and folic acid normal as per 09/23/2021  Obesity: BMI 33.7          DVT prophylaxis:SCDs Start: 12/12/21 0424     Code Status: Full Code  Family Communication: None at bedside  Patient status:Obs  Patient is from : Home  Anticipated discharge to: Home  Estimated DC date: Tomorrow   Consultants: None  Procedures: None  Antimicrobials:  Anti-infectives (From admission, onward)    None       Subjective: Patient seen and examined at the bedside this morning.  Hemodynamically stable, blood pressure little soft but she is asymptomatic.  EKG showed sinus tachycardia in the range of 110-120.  She has severe bilateral lower extremity edema.  Denies any nausea, vomiting or abdominal pain.  Objective: Vitals:   12/13/21 0500 12/13/21 0600 12/13/21 0700 12/13/21 0800  BP: (!) 95/59 (!) 100/58 (!) 90/56 (!) 90/53  Pulse: (!) 103 (!) 111 100 (!) 101  Resp: '16 13 14 13  '$ Temp:    98.7 F (37.1 C)  TempSrc:    Oral  SpO2: 92% 98% 95% 95%  Weight:      Height:  Intake/Output Summary (Last 24 hours) at 12/13/2021 1120 Last data filed at 12/13/2021 0600 Gross per 24 hour  Intake 690.43 ml  Output 575 ml  Net 115.43 ml   Filed Weights   12/11/21 2313 12/12/21 0921  Weight: 83.9 kg 89.1 kg    Examination:  General exam: Overall comfortable, not in distress, obese HEENT: PERRL Respiratory system:  no wheezes or crackles  Cardiovascular system:  S1 & S2 heard, RRR.  Gastrointestinal system: Abdomen is nondistended, soft and nontender. Central nervous system: Alert and oriented Extremities: Severe bilateral lower extremity pitting edema, no clubbing ,no cyanosis Skin: No rashes, no ulcers,no icterus     Data Reviewed: I have personally reviewed following labs and imaging studies  CBC: Recent Labs  Lab 12/11/21 2319 12/12/21 0903  WBC 7.6 6.6  NEUTROABS 4.3  --   HGB 9.5* 7.4*  HCT 28.4* 22.2*  MCV 104.8* 106.2*  PLT 136* 99*   Basic Metabolic Panel: Recent Labs  Lab 12/08/21 1118 12/08/21 2208 12/09/21 0609 12/10/21 0559 12/11/21 0430 12/11/21 2319 12/12/21 0436 12/12/21 0903 12/12/21 1309 12/12/21 1630 12/13/21 0947  NA 134*  --  133* 133* 133*   < > 137 136 139 140 135  K 4.1  --  5.0 4.4 3.1*   < > 2.9* 3.1* 3.5 3.6 3.6  CL 110  --  110 108 105   < > 109 110 112* 113* 110  CO2 16*  --  15* 14* 17*   < > 16* 17* 18* 19* 19*  GLUCOSE 377*  --  377* 300* 361*   < > 219* 179* 167* 128* 153*  BUN <5*  --  <5* 8 10   < > '10 9 9 9 7  '$ CREATININE 0.89  --  1.11* 1.16* 1.33*   < > 1.09* 1.05* 1.04* 1.10* 0.97  CALCIUM 8.1*  --  8.2* 8.6* 8.7*   < > 8.9 8.4* 8.9 9.5 8.4*  MG 1.8  --  2.0 1.9 1.8  --   --  1.8  --   --   --   PHOS 1.7* 3.2 2.9 2.8 2.5  --   --   --   --   --   --    < > = values in this interval not displayed.     Recent Results (from the past 240 hour(s))  MRSA Next Gen by PCR, Nasal     Status: None   Collection Time: 12/05/21  3:17 AM   Specimen: Nasal Mucosa; Nasal Swab  Result Value Ref Range Status   MRSA by PCR Next Gen NOT DETECTED NOT DETECTED Final    Comment: (NOTE) The GeneXpert MRSA Assay (FDA approved for NASAL specimens only), is one component of a comprehensive MRSA colonization surveillance program. It is not intended to diagnose MRSA infection nor to guide or monitor treatment for MRSA infections. Test performance is not FDA approved in patients less than 78  years old. Performed at [facility], [place] [address] [place], [place] [address]      Radiology Studies: US Abdomen Limited RUQ (LIVER/GB)  Result Date: 12/12/2021 CLINICAL DATA:  41 year old female with history of elevated liver enzymes. EXAM: ULTRASOUND ABDOMEN LIMITED RIGHT UPPER QUADRANT COMPARISON:  Abdominal ultrasound 11/18/2021. FINDINGS: Gallbladder: No gallstones or wall thickening visualized. No sonographic Murphy sign noted by sonographer. Common bile duct: Diameter: 6.5 mm Liver: No focal lesion identified. Hepatic parenchyma is diffusely but heterogeneously echogenic, and liver has a nodular contour,  indicative of underlying cirrhosis, likely with a background of fatty infiltration. Portal vein is patent on color Doppler imaging with normal direction of blood flow towards the liver. Other: None. IMPRESSION: 1. Borderline dilated common bile duct, similar to the prior examination. No cholelithiasis or definite choledocholithiasis. Additionally, there is no evidence of intrahepatic biliary ductal dilatation. If there is clinical concern for choledocholithiasis or other cause of biliary tract obstruction, further evaluation with abdominal MRI with and without IV gadolinium with MRCP could be considered. 2. Morphologic changes in the liver indicative of cirrhosis, likely with a background of hepatic steatosis. Electronically Signed   By: Vinnie Langton M.D.   On: 12/12/2021 05:49    Scheduled Meds:  Chlorhexidine Gluconate Cloth  6 each Topical Daily   dicyclomine  10 mg Oral TID AC & HS   famotidine  40 mg Oral BID   furosemide  40 mg Intravenous Q12H   gabapentin  300 mg Oral TID   insulin aspart  0-20 Units Subcutaneous TID WC   insulin aspart  0-5 Units Subcutaneous QHS   insulin glargine-yfgn  25 Units Subcutaneous Daily   lipase/protease/amylase  36,000 Units Oral TID WC   mometasone-formoterol  2 puff Inhalation BID   montelukast  10 mg Oral QHS    pantoprazole  40 mg Oral Daily   rosuvastatin  20 mg Oral QHS   Continuous Infusions:   LOS: 0 days   Shelly Coss, MD [facility] P8/24/2023, 11:20 AM

## 2021-12-13 NOTE — Progress Notes (Signed)
BLE venous duplex has been completed.  Results can be found under chart review under CV PROC. 12/13/2021 1:49 PM Breezy Hertenstein RVT, RDMS

## 2021-12-14 LAB — BASIC METABOLIC PANEL
Anion gap: 7 (ref 5–15)
Anion gap: 10 (ref 5–15)
BUN: 8 mg/dL (ref 6–20)
BUN: 9 mg/dL (ref 6–20)
CO2: 21 mmol/L — ABNORMAL LOW (ref 22–32)
CO2: 22 mmol/L (ref 22–32)
Calcium: 8.4 mg/dL — ABNORMAL LOW (ref 8.9–10.3)
Calcium: 8.4 mg/dL — ABNORMAL LOW (ref 8.9–10.3)
Chloride: 110 mmol/L (ref 98–111)
Chloride: 107 mmol/L (ref 98–111)
Creatinine, Ser: 1.16 mg/dL — ABNORMAL HIGH (ref 0.44–1.00)
Creatinine, Ser: 1.18 mg/dL — ABNORMAL HIGH (ref 0.44–1.00)
GFR, Estimated: >60 mL/min (ref >60)
GFR, Estimated: 60 mL/min — ABNORMAL LOW (ref >60)
Glucose, Bld: 175 mg/dL — ABNORMAL HIGH (ref 70–99)
Glucose, Bld: 102 mg/dL — ABNORMAL HIGH (ref 70–99)
Potassium: 2.4 mmol/L — CL (ref 3.5–5.1)
Potassium: 2.8 mmol/L — ABNORMAL LOW (ref 3.5–5.1)
Sodium: 138 mmol/L (ref 135–145)
Sodium: 139 mmol/L (ref 135–145)

## 2021-12-14 LAB — GLUCOSE, CAPILLARY
Glucose-Capillary: 166 mg/dL — ABNORMAL HIGH (ref 70–99)
Glucose-Capillary: 193 mg/dL — ABNORMAL HIGH (ref 70–99)
Glucose-Capillary: 95 mg/dL (ref 70–99)
Glucose-Capillary: 119 mg/dL — ABNORMAL HIGH (ref 70–99)

## 2021-12-14 LAB — CBC
HCT: 24.4 % — ABNORMAL LOW (ref 36.0–46.0)
Hemoglobin: 7.9 g/dL — ABNORMAL LOW (ref 12.0–15.0)
MCH: 35.1 pg — ABNORMAL HIGH (ref 26.0–34.0)
MCHC: 32.4 g/dL (ref 30.0–36.0)
MCV: 108.4 fL — ABNORMAL HIGH (ref 80.0–100.0)
Platelets: 122 10*3/uL — ABNORMAL LOW (ref 150–400)
RBC: 2.25 MIL/uL — ABNORMAL LOW (ref 3.87–5.11)
RDW: 20.1 % — ABNORMAL HIGH (ref 11.5–15.5)
WBC: 6.4 10*3/uL (ref 4.0–10.5)
nRBC: 0 % (ref 0.0–0.2)

## 2021-12-14 LAB — MAGNESIUM: Magnesium: 1.9 mg/dL (ref 1.7–2.4)

## 2021-12-14 MED ORDER — POTASSIUM CHLORIDE 10 MEQ/100ML IV SOLN: 10.0000 meq | INTRAVENOUS | Status: DC

## 2021-12-14 MED ORDER — FUROSEMIDE 10 MG/ML IJ SOLN
60.0000 mg | Freq: Two times a day (BID) | INTRAMUSCULAR | Status: DC
  Administered 2021-12-14 (×2): 60 mg via INTRAVENOUS
  Filled 2021-12-14 (×2): qty 6

## 2021-12-14 MED ORDER — POTASSIUM CHLORIDE CRYS ER 20 MEQ PO TBCR
40.0000 meq | EXTENDED_RELEASE_TABLET | Freq: Once | ORAL | Status: DC
Start: 2021-12-14 — End: 2021-12-14

## 2021-12-14 MED ORDER — POTASSIUM CHLORIDE 10 MEQ/100ML IV SOLN
10.0000 meq | INTRAVENOUS | Status: AC
  Administered 2021-12-14 (×5): 10 meq via INTRAVENOUS
  Filled 2021-12-14 (×5): qty 100

## 2021-12-14 MED ORDER — OXYCODONE HCL 5 MG PO TABS
5.0000 mg | ORAL_TABLET | ORAL | Status: DC | PRN
  Administered 2021-12-14 – 2021-12-17 (×11): 5 mg via ORAL
  Filled 2021-12-14 (×11): qty 1

## 2021-12-14 MED ORDER — FERROUS SULFATE 325 (65 FE) MG PO TABS
325.0000 mg | ORAL_TABLET | Freq: Every day | ORAL | Status: DC
  Administered 2021-12-15 – 2021-12-18 (×4): 325 mg via ORAL
  Filled 2021-12-14 (×4): qty 1

## 2021-12-14 MED ORDER — OXYCODONE HCL 5 MG PO TABS
10.0000 mg | ORAL_TABLET | ORAL | Status: DC | PRN
Start: 2021-12-14 — End: 2021-12-14

## 2021-12-14 MED ORDER — POTASSIUM CHLORIDE CRYS ER 20 MEQ PO TBCR
40.0000 meq | EXTENDED_RELEASE_TABLET | ORAL | Status: AC
  Administered 2021-12-14 (×2): 40 meq via ORAL
  Filled 2021-12-14 (×2): qty 2

## 2021-12-14 MED ORDER — OXYCODONE HCL 5 MG PO TABS
10.0000 mg | ORAL_TABLET | ORAL | Status: AC | PRN
  Administered 2021-12-14: 5 mg via ORAL

## 2021-12-14 MED ORDER — MAGNESIUM SULFATE IN D5W 1-5 GM/100ML-% IV SOLN
1.0000 g | Freq: Once | INTRAVENOUS | Status: AC
  Administered 2021-12-14: 1 g via INTRAVENOUS
  Filled 2021-12-14: qty 100

## 2021-12-14 NOTE — Progress Notes (Addendum)
PROGRESS NOTE  Anna Garcia  YIR:[id number] DOB: Nov 12, 1980 DOA: 12/11/2021 PCP: [name], MD   Brief Narrative: Patient is a 41 year old female with history of uncontrolled type 1 diabetes, diabetic gastroparesis, chronic pain syndrome, fibromyalgia, hypertension, chronic urinary retention with self cath at home who presents here with complaint of fatigue, difficulty walking.  She was just discharged from here on 8/22 after being managed for DKA, nausea, abdominal pain. On presentation, she was hyperglycemic.  Lab work showed anion gap of 17, bicarb of 13.  Started on IV insulin and DKA protocol.  Her gap has closed, she has been transitioned to long-acting and sliding scale insulin.  Hospital course remarkable for persistent sinus tachycardia, severe volume overload, lower extremity edema.  Being given Lasix today.  Assessment & Plan:  Principal Problem:   DKA (diabetic ketoacidosis) ([place]) Active Problems:   Gastroparesis due to secondary diabetes ([place])   Hypokalemia   GERD (gastroesophageal reflux disease)   AKI (acute kidney injury) ([place])   Hypertension   Macrocytic anemia   Chronic pain syndrome  DKA/diabetes type 1: Recently admitted for the same.  Hemoglobin A1c of 7.6 as per 11/18/2021.  Initially started on IV insulin.  Gap is closed.  Long-acting insulin resumed.  Monitor blood sugars.  She follows with endocrinology  AKI: Resolved with IV fluids  Severe volume overload/bilateral lower extremity edema: Bilateral lower extremity pitting edema.  It happens when she gets IV fluids.  Last echo done on 1/23 showed EF of 65 to 50%, normal diastolic parameters.  BNP normal.We will check new echo.  Venous Doppler did not show any DVT. We will continue lasix iv for now.Ace wraps.Elevate legs. Will be careful with blood pressure  Elevated liver enzymes: Ultrasound of the abdomen did not show any cholelithiasis or choledocholithiasis.  But showed changes of liver cirrhosis with hepatic  steatosis.  Diabetic gastroparesis/nausea/abdominal pain: Abdominal pain, nausea or vomiting resolved.  Liver enzymes have been stable.    Severe Hypokalemia: Supplemented with potassium and being monitored  GERD: Continue PPI  Hyperlipidemia: Continue Crestor  Hypertension: Blood pressure soft.  Losartan on hold  Chronic urinary retention: Does self cath at home.  Chronic macrocytic anemia: Currently hemoglobin in the range of 7-8.  Hemoglobin has ranged from 7 to 11 in the last several weeks.  Also has chronic thrombocytopenia, likely from liver cirrhosis.  Vitamin K93 and folic acid normal as per 09/23/2021  Obesity: BMI 33.7          DVT prophylaxis:SCDs Start: 12/12/21 0424     Code Status: Full Code  Family Communication: None at bedside  Patient status:Obs  Patient is from : Home  Anticipated discharge to: Home  Estimated DC date: Tomorrow   Consultants: None  Procedures: None  Antimicrobials:  Anti-infectives (From admission, onward)    None       Subjective: Patient seen and examined at the bedside this morning.  Hemodynamically stable.  Blood pressure on the lower side but she is not symptomatic.  She still has lower extremity edema.  Complains of pain bilateral  legs Objective: Vitals:   12/14/21 0417 12/14/21 0441 12/14/21 0646 12/14/21 0651  BP: 108/73  (!) 89/56 (!) 90/58  Pulse: (!) 121 99 96 88  Resp: 18  15   Temp: 97.8 F (36.6 C)  98.4 F (36.9 C)   TempSrc: Oral  Oral   SpO2: 98% 97% 96%   Weight:      Height:  No intake or output data in the 24 hours ending 12/14/21 1116  Filed Weights   12/11/21 2313 12/12/21 0921  Weight: 83.9 kg 89.1 kg    Examination:  General exam: Overall comfortable, not in distress,obese HEENT: PERRL Respiratory system:  no wheezes or crackles  Cardiovascular system: S1 & S2 heard, RRR.  Gastrointestinal system: Abdomen is nondistended, soft and nontender. Central nervous system: Alert  and oriented Extremities: severe bilateral pitting edema, no clubbing ,no cyanosis Skin: No rashes, no ulcers,no icterus     Data Reviewed: I have personally reviewed following labs and imaging studies  CBC: Recent Labs  Lab 12/11/21 2319 12/12/21 0903 12/14/21 0536  WBC 7.6 6.6 6.4  NEUTROABS 4.3  --   --   HGB 9.5* 7.4* 7.9*  HCT 28.4* 22.2* 24.4*  MCV 104.8* 106.2* 108.4*  PLT 136* 99* 967*   Basic Metabolic Panel: Recent Labs  Lab 12/08/21 1118 12/08/21 2208 12/09/21 0609 12/10/21 0559 12/11/21 0430 12/11/21 2319 12/12/21 0903 12/12/21 1309 12/12/21 1630 12/13/21 0947 12/14/21 0536  NA 134*  --  133* 133* 133*   < > 136 139 140 135 138  K 4.1  --  5.0 4.4 3.1*   < > 3.1* 3.5 3.6 3.6 2.4*  CL 110  --  110 108 105   < > 110 112* 113* 110 110  CO2 16*  --  15* 14* 17*   < > 17* 18* 19* 19* 21*  GLUCOSE 377*  --  377* 300* 361*   < > 179* 167* 128* 153* 175*  BUN <5*  --  <5* 8 10   < > '9 9 9 7 8  '$ CREATININE 0.89  --  1.11* 1.16* 1.33*   < > 1.05* 1.04* 1.10* 0.97 1.16*  CALCIUM 8.1*  --  8.2* 8.6* 8.7*   < > 8.4* 8.9 9.5 8.4* 8.4*  MG 1.8  --  2.0 1.9 1.8  --  1.8  --   --   --  1.9  PHOS 1.7* 3.2 2.9 2.8 2.5  --   --   --   --   --   --    < > = values in this interval not displayed.     Recent Results (from the past 240 hour(s))  MRSA Next Gen by PCR, Nasal     Status: None   Collection Time: 12/05/21  3:17 AM   Specimen: Nasal Mucosa; Nasal Swab  Result Value Ref Range Status   MRSA by PCR Next Gen NOT DETECTED NOT DETECTED Final    Comment: (NOTE) The GeneXpert MRSA Assay (FDA approved for NASAL specimens only), is one component of a comprehensive MRSA colonization surveillance program. It is not intended to diagnose MRSA infection nor to guide or monitor treatment for MRSA infections. Test performance is not FDA approved in patients less than 96 years old. Performed at [facility], [place] [address], [place], [place] [address]       Radiology Studies: VAS [place] LOWER EXTREMITY VENOUS (DVT)  Result Date: 12/13/2021  Lower Venous DVT Study Patient Name:  Anna Garcia  Date of Exam:   12/13/2021 Medical Rec #: [id number]     Accession #:    [id number] Date of Birth: September 28, 1980      Patient Gender: F Patient Age:   43 years Exam Location:  [facility] Procedure:      VAS [place] LOWER EXTREMITY VENOUS (DVT) Referring Phys: Tenaya Hilyer --------------------------------------------------------------------------------  Indications:  Edema.  Limitations: Body habitus, poor ultrasound/tissue interface and subcutaneous edema. Comparison Study: No previous exams Performing Technologist: Jody Hill RVT, RDMS  Examination Guidelines: A complete evaluation includes B-mode imaging, spectral Doppler, color Doppler, and power Doppler as needed of all accessible portions of each vessel. Bilateral testing is considered an integral part of a complete examination. Limited examinations for reoccurring indications may be performed as noted. The reflux portion of the exam is performed with the patient in reverse Trendelenburg.  +--------+---------------+---------+-----------+----------+--------------------+ RIGHT   CompressibilityPhasicitySpontaneityPropertiesThrombus Aging       +--------+---------------+---------+-----------+----------+--------------------+ CFV     Full           Yes      Yes                                       +--------+---------------+---------+-----------+----------+--------------------+ SFJ     Full                                                              +--------+---------------+---------+-----------+----------+--------------------+ FV Prox Full           Yes      Yes                                       +--------+---------------+---------+-----------+----------+--------------------+ FV Mid  Full           Yes      Yes                                        +--------+---------------+---------+-----------+----------+--------------------+ FV      Full           Yes      Yes                                       Distal                                                                    +--------+---------------+---------+-----------+----------+--------------------+ PFV                    Yes      Yes                  patent by                                                                 color/doppler        +--------+---------------+---------+-----------+----------+--------------------+ POP  Full           Yes      Yes                                       +--------+---------------+---------+-----------+----------+--------------------+ PTV     Full                                         Not well visualized  +--------+---------------+---------+-----------+----------+--------------------+ PERO                                                 not visualized       +--------+---------------+---------+-----------+----------+--------------------+   Right Technical Findings: Not visualized segments include PeroV.  +--------+---------------+---------+-----------+----------+--------------------+ LEFT    CompressibilityPhasicitySpontaneityPropertiesThrombus Aging       +--------+---------------+---------+-----------+----------+--------------------+ CFV     Full           Yes      Yes                                       +--------+---------------+---------+-----------+----------+--------------------+ SFJ     Full                                                              +--------+---------------+---------+-----------+----------+--------------------+ FV Prox Full           Yes      Yes                                       +--------+---------------+---------+-----------+----------+--------------------+ FV Mid  Full           Yes      Yes                                        +--------+---------------+---------+-----------+----------+--------------------+ FV                                                   Not visualized       Distal                                                                    +--------+---------------+---------+-----------+----------+--------------------+ PFV                    Yes      Yes  patent by                                                                 color/doppler        +--------+---------------+---------+-----------+----------+--------------------+ POP     Full           Yes      Yes                                       +--------+---------------+---------+-----------+----------+--------------------+ PTV                                                  patent by color      +--------+---------------+---------+-----------+----------+--------------------+ PERO    Full                                                              +--------+---------------+---------+-----------+----------+--------------------+   Left Technical Findings: Not visualized segments include distal FV, PTV.   Summary: BILATERAL: -No evidence of popliteal cyst, bilaterally. - Diffuse subcutaneous edema, bilaterally. RIGHT: - There is no evidence of deep vein thrombosis in the lower extremity. However, portions of this examination were limited- see technologist comments above.  LEFT: - There is no evidence of deep vein thrombosis in the lower extremity. However, portions of this examination were limited- see technologist comments above.  *See table(s) above for measurements and observations. Electronically signed by Monica Martinez MD on 12/13/2021 at 2:05:22 PM.    Final     Scheduled Meds:  dicyclomine  10 mg Oral TID AC & HS   famotidine  40 mg Oral BID   furosemide  60 mg Intravenous BID   gabapentin  300 mg Oral TID   insulin aspart  0-20 Units Subcutaneous TID WC   insulin aspart  0-5 Units  Subcutaneous QHS   insulin glargine-yfgn  25 Units Subcutaneous Daily   lipase/protease/amylase  36,000 Units Oral TID WC   mometasone-formoterol  2 puff Inhalation BID   montelukast  10 mg Oral QHS   pantoprazole  40 mg Oral Daily   rosuvastatin  20 mg Oral QHS   Continuous Infusions:  potassium chloride 10 mEq (12/14/21 1058)     LOS: 1 day   Shelly Coss, MD [facility] P8/25/2023, 11:16 AM

## 2021-12-14 NOTE — Progress Notes (Signed)
   12/13/21 2248  Assess: MEWS Score  Temp 98.4 F (36.9 C)  BP 94/62  MAP (mmHg) 72  Pulse Rate (!) 114  Resp 16  SpO2 98 %  O2 Device Room Air  Assess: MEWS Score  MEWS Temp 0  MEWS Systolic 1  MEWS Pulse 2  MEWS RR 0  MEWS LOC 0  MEWS Score 3  MEWS Score Color Yellow  Assess: if the MEWS score is Yellow or Red  Were vital signs taken at a resting state? Yes  Focused Assessment No change from prior assessment  Does the patient meet 2 or more of the SIRS criteria? Yes  Does the patient have a confirmed or suspected source of infection? No  MEWS guidelines implemented *See Row Information* Yes  Treat  MEWS Interventions Escalated (See documentation below)  Pain Scale 0-10  Pain Score 8  Pain Type Acute pain  Pain Location Leg  Pain Orientation Right;Left  Pain Descriptors / Indicators Aching  Pain Frequency Intermittent  Pain Onset Gradual  Patients Stated Pain Goal 0  Pain Intervention(s) MD notified (Comment) (pt requesting pain medication)  Take Vital Signs  Increase Vital Sign Frequency  Yellow: Q 2hr X 2 then Q 4hr X 2, if remains yellow, continue Q 4hrs  Escalate  MEWS: Escalate Yellow: discuss with charge nurse/RN and consider discussing with provider and RRT  Notify: Charge Nurse/RN  Name of Charge Nurse/RN Notified Tom, RN  Date Charge Nurse/RN Notified 12/13/21  Time Charge Nurse/RN Notified 2250  Notify: Provider  Provider Name/Title Opyd, MD  Date Provider Notified 12/13/21  Time Provider Notified 2250  Method of Notification Page  Notification Reason Other (Comment) (mews yellow dt elevated HR; pt requesting pain medication)  Provider response See new orders  Date of Provider Response 12/13/21  Time of Provider Response 2250  Document  Patient Outcome Stabilized after interventions  Progress note created (see row info) Yes  Assess: SIRS CRITERIA  SIRS Temperature  0  SIRS Pulse 1  SIRS Respirations  0  SIRS WBC 1  SIRS Score Sum  2   Pt's  mews yellow d/t elevated HR. MD and charge RN notified. Yellow mews protocol initiated. Plan of care ongoing.

## 2021-12-14 NOTE — TOC Initial Note (Addendum)
Transition of Care [facility]) - Initial/Assessment Note    Patient Details  Name: Anna Garcia MRN: [id number] Date of Birth: [birth year]  [facility] CM/SW Contact:    Vassie Moselle, LCSW Phone Number: 12/14/2021, 10:11 AM  Clinical Narrative:                 Met with pt to discuss home health;DME needs. Pt shares that she lives alone in an apartment and struggles to cook an clean due to weakness. She also states she does not have a shower bar and is concerned with falling while getting into the shower. Pt states she uses a cane at home and declines need for RW at this time as her apartment is small and she does not feel that the rolling walker would be useful/fit in her apartment.  Pt could benefit from home health aide and SW however, insurance will not pay for these services alone and would have to be private pay unless she also requires home health PT/OT. CSW will continue to follow for recommendations of PT/OT.     Expected Discharge Plan: Home/Self Care Barriers to Discharge: Continued Medical Work up   Patient Goals and CMS Choice Patient states their goals for this hospitalization and ongoing recovery are:: To go home with assistance   Choice offered to / list presented to : Patient  Expected Discharge Plan and Services Expected Discharge Plan: Home/Self Care In-house Referral: Clinical Social Work Discharge Planning Services: CM Consult Post Acute Care Choice: [place] arrangements for the past 2 months: Apartment                 DME Arranged: N/A DME Agency: NA                  Prior Living Arrangements/Services Living arrangements for the past 2 months: Apartment Lives with:: Self Patient language and need for interpreter reviewed:: Yes Do you feel safe going back to the place where you live?: Yes      Need for Family Participation in Patient Care: No (Comment) Care giver support system in place?: No (comment) Current home services:  DME Criminal Activity/Legal Involvement Pertinent to Current Situation/Hospitalization: No - Comment as needed  Activities of Daily Living      Permission Sought/Granted   Permission granted to share information with : No              Emotional Assessment Appearance:: Appears stated age Attitude/Demeanor/Rapport: Engaged Affect (typically observed): Pleasant Orientation: : Oriented to Self, Oriented to Place, Oriented to  Time, Oriented to Situation Alcohol / Substance Use: Not Applicable Psych Involvement: No (comment)  Admission diagnosis:  DKA (diabetic ketoacidosis) ([place]) [E11.10] Diabetic ketoacidosis without coma associated with type 1 diabetes mellitus ([place]) [E10.10] Patient Active Problem List   Diagnosis Date Noted   Macrocytic anemia 12/05/2021   Enteritis 12/05/2021   Chronic pain syndrome 12/05/2021   Refractory nausea and vomiting 12/04/2021   High anion gap metabolic acidosis 27/78/2423   GERD (gastroesophageal reflux disease) 11/19/2021   Moderate persistent asthma 11/19/2021   Hypomagnesemia 09/23/2021   Hypophosphatemia 09/23/2021   Fever 05/27/2021   Left genital labial abscess 05/25/2021   Sinus tachycardia    Labial swelling: Left 05/24/2021   Vulvar abscess 05/24/2021   Elevated brain natriuretic peptide (BNP) level 05/22/2021   Overweight (BMI 25.0-29.9) 05/22/2021   Hypokalemia 05/21/2021   Fibromyalgia 10/12/2020   CKD stage G3a/A3, GFR 45-59 and albumin creatinine ratio >300 mg/g ([place])  10/12/2020   DKA (diabetic ketoacidosis) ([place]) 07/05/2020   SIRS (systemic inflammatory response syndrome) ([place]) 07/05/2020   Asthma 07/05/2020   AKI (acute kidney injury) ([place]) 07/05/2020   Transaminitis 07/05/2020   Hypertension    Gastroparesis due to secondary diabetes ([place])    Uncontrolled type 1 diabetes mellitus with hyperglycemia, with long-term current use of insulin ([place]) 06/19/2006   NEUROPATHY, PERIPHERAL 06/19/2006   ACNE 06/19/2006   PCP:   Day, [name], MD Pharmacy:   [facility] [place], [place] - [address] [place] [address] Phone: [phone] Fax: [phone]     Social Determinants of Health ([place]) Interventions    Readmission Risk Interventions    12/14/2021   10:09 AM 09/24/2021    1:14 PM  Readmission Risk Prevention Plan  Transportation Screening Complete   PCP or Specialist Appt within 3-5 Days Complete   HRI or [place] Complete Complete  Social Work Consult for [place] Planning/Counseling Complete Complete  Palliative Care Screening Not Applicable Not Applicable  Medication Review Press photographer) Complete

## 2021-12-14 NOTE — Evaluation (Signed)
Occupational Therapy Evaluation Patient Details Name: Anna Garcia MRN: [id number] DOB: 06/30/1980 Today's Date: 12/14/2021   History of Present Illness Pt is a 42 yo female admitted just after recent d/c with difficulty walking and fatigue.  Pt on DKA protocol.  PT iwth DM, fibromyalgia, chronic pain syndrome, HTN, chronic urinary retention with self caths.   Clinical Impression   Pt admitted with above diagnosis and has the deficits outlined below. Pt would benefit from cont OT to attempt to reach a modified independent level of functioning so pt can d/c home alone. Pt was recently d/c'd with quick readmission due to inability to mobilize well.  Pt would benefit from use of walker or rollator in the home due to sudden needs to sit and poor balance but pt states this will not fit in her apartment and feels a regular walker also will not.  Pt does not have any family support.  A brother is in the area and helps on occasion but cannot assist regularly.  Feel the edema in pt's legs are making mobilizing and ability to complete IADLS like cooking almost impossible.  At this point pt is not safe to d/c home alone but hopefully with some rehab and medical stability pt can reach a level that she can d/c back home alone. Will continue to see with focus on basic adls and progress to home skills.       Recommendations for follow up therapy are one component of a multi-disciplinary discharge planning process, led by the attending physician.  Recommendations may be updated based on patient status, additional functional criteria and insurance authorization.   Follow Up Recommendations  Home health OT    Assistance Recommended at Discharge Intermittent Supervision/Assistance  Patient can return home with the following A little help with walking and/or transfers;A little help with bathing/dressing/bathroom;Assistance with feeding;Assistance with cooking/housework;Assist for transportation    Functional Status  Assessment  Patient has had a recent decline in their functional status and demonstrates the ability to make significant improvements in function in a reasonable and predictable amount of time.  Equipment Recommendations       Recommendations for Other Services       Precautions / Restrictions Precautions Precautions: Fall Precaution Comments: Pt has had recent falls lately but states she does not have room to mobilize in her apartment with a walker. Restrictions Weight Bearing Restrictions: No Other Position/Activity Restrictions: signficiant LE edema      Mobility Bed Mobility Overal bed mobility: Needs Assistance Bed Mobility: Supine to Sit, Sit to Supine     Supine to sit: Min guard, HOB elevated (lots of encouragement) Sit to supine: Max assist   General bed mobility comments: assist to get legs into the bed.    Transfers Overall transfer level: Needs assistance Equipment used: Rolling walker (2 wheels) Transfers: Sit to/from Stand, Bed to chair/wheelchair/BSC Sit to Stand: Min guard     Step pivot transfers: Min guard     General transfer comment: cues for hand placement.  Pt states she cannot get out of bed and into standing without assist but was able to do so with min guard assist.      Balance Overall balance assessment: Mild deficits observed, not formally tested                                         ADL either performed or assessed  with clinical judgement   ADL Overall ADL's : Needs assistance/impaired Eating/Feeding: Independent;Sitting   Grooming: Wash/dry hands;Wash/dry face;Oral care;Set up;Sitting   Upper Body Bathing: Set up;Sitting   Lower Body Bathing: Moderate assistance;Sit to/from stand;Cueing for compensatory techniques   Upper Body Dressing : Set up;Sitting   Lower Body Dressing: Moderate assistance;Sit to/from stand;Cueing for compensatory techniques Lower Body Dressing Details (indicate cue type and reason): Pt  rarely puts on socks and shoes as she rarely leaves home. Toilet Transfer: Minimal assistance;BSC/3in1;Stand-pivot   Toileting- Water quality scientist and Hygiene: Min guard;Sit to/from stand;Cueing for compensatory techniques       Functional mobility during ADLs: Minimal assistance;Rolling walker (2 wheels) General ADL Comments: Pt very limited with adls at home.  Pt has not family support. States she has one brother adn sister in law that assist at times but not often and she cannot live with them.     Vision Baseline Vision/History: 0 No visual deficits Ability to See in Adequate Light: 0 Adequate Patient Visual Report: No change from baseline Vision Assessment?: No apparent visual deficits Additional Comments: states her vision will change based on her blood sugars and uncontrolled diabetes.  Was seeing adequately on eval.     Perception     Praxis      Pertinent Vitals/Pain Pain Assessment Pain Assessment: 0-10 Pain Score: 8  Pain Location: B legs Pain Descriptors / Indicators: Constant, Throbbing, Tightness, Discomfort Pain Intervention(s): Limited activity within patient's tolerance, Monitored during session, Repositioned, Premedicated before session     Hand Dominance Right   Extremity/Trunk Assessment Upper Extremity Assessment Upper Extremity Assessment: Generalized weakness   Lower Extremity Assessment Lower Extremity Assessment: Defer to PT evaluation   Cervical / Trunk Assessment Cervical / Trunk Assessment: Normal   Communication Communication Communication: No difficulties   Cognition Arousal/Alertness: Awake/alert Behavior During Therapy: Flat affect Overall Cognitive Status: Within Functional Limits for tasks assessed                                 General Comments: Pt appears generally intact with cognition but does not seem open to considering new ideas for independence at home.  This may be due to trying so many things that have  not worked, possible depression.     General Comments  Pt limited by body habitus, swelling, poor support system, weakness, and inability to tolerate standing for any significant amount of time to complete adls.    Exercises     Shoulder Instructions      Home Living Family/patient expects to be discharged to:: Private residence Living Arrangements: Alone Available Help at Discharge: Other (Comment) (no assist at home) Type of Home: Apartment Home Access: Level entry     Home Layout: One level     Bathroom Shower/Tub: Tub/shower unit;Curtain   Bathroom Toilet: Standard Bathroom Accessibility: No       Additional Comments: Pt could use a walker or possibly a rollator due to falls and unsteadiness on feet. Pt states she does not have room to store it in her apartment.  Showed her how it folds and pt said she would consider it but doesn't feel it is realistic.      Prior Functioning/Environment Prior Level of Function : History of Falls (last six months);Needs assist       Physical Assist : Mobility (physical);ADLs (physical) Mobility (physical): Gait;Stairs ADLs (physical): IADLs Mobility Comments: Pt has had slow decline over last few  weeks with mobility.  States she has been falling more but does not use walker ADLs Comments: Pt having problems standing to cook for self which then affects her blood sugars. Pt has all items delivered to her home bc she no longer drives.  Rec a rollator to have to assist with balance but also provide a chair when pt becomes dizzy or needs to cook in kitchen but pt states there is no room for these types of things in her apartment.        OT Problem List: Decreased strength;Decreased activity tolerance;Impaired balance (sitting and/or standing);Decreased knowledge of use of DME or AE;Pain;Increased edema      OT Treatment/Interventions: Self-care/ADL training;Therapeutic activities;DME and/or AE instruction    OT Goals(Current goals can  be found in the care plan section) Acute Rehab OT Goals Patient Stated Goal: to be able to move better and with less pain OT Goal Formulation: With patient Time For Goal Achievement: 12/28/21 Potential to Achieve Goals: Good ADL Goals Pt Will Perform Lower Body Bathing: with modified independence;sit to/from stand Pt Will Perform Lower Body Dressing: with modified independence;sit to/from stand;with adaptive equipment Pt Will Perform Tub/Shower Transfer: Tub transfer;ambulating;grab bars;rolling walker Additional ADL Goal #1: Pt will walk to bathroom without walker and complete all toileting tasks with no assist.  OT Frequency: Min 2X/week    Co-evaluation              AM-PAC OT "6 Clicks" Daily Activity     Outcome Measure Help from another person eating meals?: None Help from another person taking care of personal grooming?: None Help from another person toileting, which includes using toliet, bedpan, or urinal?: A Little Help from another person bathing (including washing, rinsing, drying)?: A Little Help from another person to put on and taking off regular upper body clothing?: A Little Help from another person to put on and taking off regular lower body clothing?: A Lot 6 Click Score: 19   End of Session Equipment Utilized During Treatment: Rolling walker (2 wheels) Nurse Communication: Mobility status  Activity Tolerance: Patient limited by pain Patient left: in bed;with call bell/phone within reach  OT Visit Diagnosis: Unsteadiness on feet (R26.81);History of falling (Z91.81);Pain Pain - part of body: Leg                Time: 1227-1252 OT Time Calculation (min): 25 min Charges:  OT General Charges $OT Visit: 1 Visit OT Evaluation $OT Eval Moderate Complexity: 1 Mod OT Treatments $Self Care/Home Management : 8-22 mins  Glenford Peers 12/14/2021, 1:17 PM

## 2021-12-15 ENCOUNTER — Inpatient Hospital Stay (HOSPITAL_COMMUNITY): Payer: Medicare Other

## 2021-12-15 DIAGNOSIS — I5031 Acute diastolic (congestive) heart failure: Secondary | ICD-10-CM | POA: Diagnosis not present

## 2021-12-15 LAB — BASIC METABOLIC PANEL
Anion gap: 9 (ref 5–15)
BUN: 11 mg/dL (ref 6–20)
CO2: 21 mmol/L — ABNORMAL LOW (ref 22–32)
Calcium: 8.3 mg/dL — ABNORMAL LOW (ref 8.9–10.3)
Chloride: 109 mmol/L (ref 98–111)
Creatinine, Ser: 1.02 mg/dL — ABNORMAL HIGH (ref 0.44–1.00)
GFR, Estimated: >60 mL/min (ref >60)
Glucose, Bld: 76 mg/dL (ref 70–99)
Potassium: 3.2 mmol/L — ABNORMAL LOW (ref 3.5–5.1)
Sodium: 139 mmol/L (ref 135–145)

## 2021-12-15 LAB — GLUCOSE, CAPILLARY
Glucose-Capillary: 78 mg/dL (ref 70–99)
Glucose-Capillary: 144 mg/dL — ABNORMAL HIGH (ref 70–99)
Glucose-Capillary: 60 mg/dL — ABNORMAL LOW (ref 70–99)
Glucose-Capillary: 57 mg/dL — ABNORMAL LOW (ref 70–99)
Glucose-Capillary: 67 mg/dL — ABNORMAL LOW (ref 70–99)
Glucose-Capillary: 67 mg/dL — ABNORMAL LOW (ref 70–99)
Glucose-Capillary: 171 mg/dL — ABNORMAL HIGH (ref 70–99)
Glucose-Capillary: 49 mg/dL — ABNORMAL LOW (ref 70–99)
Glucose-Capillary: 52 mg/dL — ABNORMAL LOW (ref 70–99)
Glucose-Capillary: 173 mg/dL — ABNORMAL HIGH (ref 70–99)
Glucose-Capillary: 55 mg/dL — ABNORMAL LOW (ref 70–99)

## 2021-12-15 LAB — ECHOCARDIOGRAM COMPLETE
Area-P 1/2: 3.46 cm2
Height: 64 in
S' Lateral: 3.2 cm
Weight: 3142.88 oz

## 2021-12-15 MED ORDER — POTASSIUM CHLORIDE CRYS ER 20 MEQ PO TBCR
40.0000 meq | EXTENDED_RELEASE_TABLET | Freq: Two times a day (BID) | ORAL | Status: DC
  Administered 2021-12-15 – 2021-12-18 (×8): 40 meq via ORAL
  Filled 2021-12-15 (×8): qty 2

## 2021-12-15 MED ORDER — POTASSIUM CHLORIDE CRYS ER 20 MEQ PO TBCR: 40.0000 meq | EXTENDED_RELEASE_TABLET | Freq: Once | ORAL | Status: DC

## 2021-12-15 MED ORDER — GLUCOSE 40 % PO GEL
2.0000 | ORAL | Status: AC
  Administered 2021-12-15: 62 g via ORAL

## 2021-12-15 MED ORDER — DEXTROSE 50 % IV SOLN
12.5000 g | INTRAVENOUS | Status: AC
  Administered 2021-12-15: 12.5 g via INTRAVENOUS

## 2021-12-15 MED ORDER — GLUCOSE 40 % PO GEL
ORAL | Status: AC
  Filled 2021-12-15: qty 1.21

## 2021-12-15 MED ORDER — FUROSEMIDE 10 MG/ML IJ SOLN
60.0000 mg | Freq: Two times a day (BID) | INTRAMUSCULAR | Status: DC
  Administered 2021-12-15 – 2021-12-16 (×3): 60 mg via INTRAVENOUS
  Filled 2021-12-15 (×3): qty 6

## 2021-12-15 NOTE — Progress Notes (Signed)
PROGRESS NOTE  Anna Garcia  KNL:[id number] DOB: [birth year] DOA: 12/11/2021 PCP: [name], MD   Brief Narrative: Patient is a 41 year old female with history of uncontrolled type 1 diabetes, diabetic gastroparesis, chronic pain syndrome, fibromyalgia, hypertension, chronic urinary retention with self cath at home who presents here with complaint of fatigue, difficulty walking.  She was just discharged from here on 8/22 after being managed for DKA, nausea, abdominal pain. On presentation, she was hyperglycemic.  Lab work showed anion gap of 17, bicarb of 13.  Started on IV insulin and DKA protocol.  Her gap has closed, she has been transitioned to long-acting and sliding scale insulin.  Hospital course remarkable for persistent sinus tachycardia, severe volume overload, lower extremity edema.Started on iv Lasix .  Assessment & Plan:  Principal Problem:   DKA (diabetic ketoacidosis) ([place]) Active Problems:   Gastroparesis due to secondary diabetes ([place])   Hypokalemia   GERD (gastroesophageal reflux disease)   AKI (acute kidney injury) ([place])   Hypertension   Macrocytic anemia   Chronic pain syndrome  DKA/diabetes type 1: Recently admitted for the same.  Hemoglobin A1c of 7.6 as per 11/18/2021.  Initially started on IV insulin.  Gap is closed.  Long-acting insulin resumed.  Monitor blood sugars.  She follows with endocrinology  AKI: Resolved with IV fluids  Severe volume overload/bilateral lower extremity edema: Bilateral lower extremity pitting edema.  It happens when she gets IV fluids.  Last echo done on 1/23 showed EF of 65 to 79%, normal diastolic parameters.  BNP normal.We will check new echo.  Venous Doppler did not show any DVT. We will continue lasix iv for now.Ace wraps.Elevate legs. Will be careful with blood pressure  Elevated liver enzymes: Ultrasound of the abdomen did not show any cholelithiasis or choledocholithiasis.  But showed changes of liver cirrhosis with hepatic  steatosis.  Diabetic gastroparesis/nausea/abdominal pain: Abdominal pain, nausea or vomiting resolved.  Liver enzymes have been stable.    Severe Hypokalemia: Supplemented with potassium and being monitored  GERD: Continue PPI  Hyperlipidemia: Continue Crestor  Hypertension: Blood pressure soft.  Losartan on hold  Chronic urinary retention: Does self cath at home.  Chronic macrocytic anemia: Currently hemoglobin in the range of 7-8.  Hemoglobin has ranged from 7 to 11 in the last several weeks.  Also has chronic thrombocytopenia, likely from liver cirrhosis.  Vitamin K24 and folic acid normal as per 09/23/2021  Obesity: BMI 33.7          DVT prophylaxis:SCDs Start: 12/12/21 0424     Code Status: Full Code  Family Communication: None at bedside  Patient status:Inpatient  Patient is from : Home  Anticipated discharge to: Home  Estimated DC date: 1-2 days.  Still has severe bilateral lower extremity edema   Consultants: None  Procedures: None  Antimicrobials:  Anti-infectives (From admission, onward)    None       Subjective: Patient seen and examined at the bedside this morning.  Hemodynamically stable.  Sitting in the chair.  Still has significant bilateral lower extremity edema up to the knees.  We discussed about continuing IV diuresis today. Objective: Vitals:   12/14/21 1949 12/14/21 2018 12/15/21 0507 12/15/21 0857  BP: 106/75  106/78   Pulse: (!) 109  (!) 102   Resp: '14 17 14   '$ Temp: 98.3 F (36.8 C)  (!) 97.3 F (36.3 C)   TempSrc: Oral  Oral   SpO2: 93%  100% 99%  Weight:  Height:        Intake/Output Summary (Last 24 hours) at 12/15/2021 1126 Last data filed at 12/15/2021 0800 Gross per 24 hour  Intake 838 ml  Output --  Net 838 ml    Filed Weights   12/11/21 2313 12/12/21 0921  Weight: 83.9 kg 89.1 kg    Examination:  General exam: Overall comfortable, not in distress HEENT: PERRL Respiratory system:  no wheezes or  crackles  Cardiovascular system: S1 & S2 heard, RRR.  Gastrointestinal system: Abdomen is nondistended, soft and nontender. Central nervous system: Alert and oriented Extremities: Severe bilateral lower extremity pitting edema, no clubbing ,no cyanosis Skin: No rashes, no ulcers,no icterus     Data Reviewed: I have personally reviewed following labs and imaging studies  CBC: Recent Labs  Lab 12/11/21 2319 12/12/21 0903 12/14/21 0536  WBC 7.6 6.6 6.4  NEUTROABS 4.3  --   --   HGB 9.5* 7.4* 7.9*  HCT 28.4* 22.2* 24.4*  MCV 104.8* 106.2* 108.4*  PLT 136* 99* 683*   Basic Metabolic Panel: Recent Labs  Lab 12/08/21 2208 12/09/21 0609 12/09/21 0609 12/10/21 0559 12/11/21 0430 12/11/21 2319 12/12/21 0903 12/12/21 1309 12/12/21 1630 12/13/21 0947 12/14/21 0536 12/14/21 1505 12/15/21 0607  NA  --  133*   < > 133* 133*   < > 136   < > 140 135 138 139 139  K  --  5.0   < > 4.4 3.1*   < > 3.1*   < > 3.6 3.6 2.4* 2.8* 3.2*  CL  --  110   < > 108 105   < > 110   < > 113* 110 110 107 109  CO2  --  15*   < > 14* 17*   < > 17*   < > 19* 19* 21* 22 21*  GLUCOSE  --  377*   < > 300* 361*   < > 179*   < > 128* 153* 175* 102* 76  BUN  --  <5*   < > 8 10   < > 9   < > '9 7 8 9 11  '$ CREATININE  --  1.11*   < > 1.16* 1.33*   < > 1.05*   < > 1.10* 0.97 1.16* 1.18* 1.02*  CALCIUM  --  8.2*   < > 8.6* 8.7*   < > 8.4*   < > 9.5 8.4* 8.4* 8.4* 8.3*  MG  --  2.0  --  1.9 1.8  --  1.8  --   --   --  1.9  --   --   PHOS 3.2 2.9  --  2.8 2.5  --   --   --   --   --   --   --   --    < > = values in this interval not displayed.     No results found for this or any previous visit (from the past 240 hour(s)).    Radiology Studies: VAS [place] LOWER EXTREMITY VENOUS (DVT)  Result Date: 12/13/2021  Lower Venous DVT Study Patient Name:  Anna Garcia  Date of Exam:   12/13/2021 Medical Rec #: [id number]     Accession #:    [id number] Date of Birth: 1980/12/11      Patient Gender: F Patient Age:   77 years  Exam Location:  [facility] Procedure:      VAS [place] LOWER EXTREMITY VENOUS (DVT) Referring Phys: Maximillion Gill  Sharlisa Hollifield --------------------------------------------------------------------------------  Indications: Edema.  Limitations: Body habitus, poor ultrasound/tissue interface and subcutaneous edema. Comparison Study: No previous exams Performing Technologist: Jody Hill RVT, RDMS  Examination Guidelines: A complete evaluation includes B-mode imaging, spectral Doppler, color Doppler, and power Doppler as needed of all accessible portions of each vessel. Bilateral testing is considered an integral part of a complete examination. Limited examinations for reoccurring indications may be performed as noted. The reflux portion of the exam is performed with the patient in reverse Trendelenburg.  +--------+---------------+---------+-----------+----------+--------------------+ RIGHT   CompressibilityPhasicitySpontaneityPropertiesThrombus Aging       +--------+---------------+---------+-----------+----------+--------------------+ CFV     Full           Yes      Yes                                       +--------+---------------+---------+-----------+----------+--------------------+ SFJ     Full                                                              +--------+---------------+---------+-----------+----------+--------------------+ FV Prox Full           Yes      Yes                                       +--------+---------------+---------+-----------+----------+--------------------+ FV Mid  Full           Yes      Yes                                       +--------+---------------+---------+-----------+----------+--------------------+ FV      Full           Yes      Yes                                       Distal                                                                    +--------+---------------+---------+-----------+----------+--------------------+ PFV                     Yes      Yes                  patent by                                                                 color/doppler        +--------+---------------+---------+-----------+----------+--------------------+  POP     Full           Yes      Yes                                       +--------+---------------+---------+-----------+----------+--------------------+ PTV     Full                                         Not well visualized  +--------+---------------+---------+-----------+----------+--------------------+ PERO                                                 not visualized       +--------+---------------+---------+-----------+----------+--------------------+   Right Technical Findings: Not visualized segments include PeroV.  +--------+---------------+---------+-----------+----------+--------------------+ LEFT    CompressibilityPhasicitySpontaneityPropertiesThrombus Aging       +--------+---------------+---------+-----------+----------+--------------------+ CFV     Full           Yes      Yes                                       +--------+---------------+---------+-----------+----------+--------------------+ SFJ     Full                                                              +--------+---------------+---------+-----------+----------+--------------------+ FV Prox Full           Yes      Yes                                       +--------+---------------+---------+-----------+----------+--------------------+ FV Mid  Full           Yes      Yes                                       +--------+---------------+---------+-----------+----------+--------------------+ FV                                                   Not visualized       Distal                                                                    +--------+---------------+---------+-----------+----------+--------------------+ PFV                    Yes  Yes                  patent by                                                                 color/doppler        +--------+---------------+---------+-----------+----------+--------------------+ POP     Full           Yes      Yes                                       +--------+---------------+---------+-----------+----------+--------------------+ PTV                                                  patent by color      +--------+---------------+---------+-----------+----------+--------------------+ PERO    Full                                                              +--------+---------------+---------+-----------+----------+--------------------+   Left Technical Findings: Not visualized segments include distal FV, PTV.   Summary: BILATERAL: -No evidence of popliteal cyst, bilaterally. - Diffuse subcutaneous edema, bilaterally. RIGHT: - There is no evidence of deep vein thrombosis in the lower extremity. However, portions of this examination were limited- see technologist comments above.  LEFT: - There is no evidence of deep vein thrombosis in the lower extremity. However, portions of this examination were limited- see technologist comments above.  *See table(s) above for measurements and observations. Electronically signed by Monica Martinez MD on 12/13/2021 at 2:05:22 PM.    Final     Scheduled Meds:  dicyclomine  10 mg Oral TID AC & HS   famotidine  40 mg Oral BID   ferrous sulfate  325 mg Oral Q breakfast   furosemide  60 mg Intravenous BID   gabapentin  300 mg Oral TID   insulin aspart  0-20 Units Subcutaneous TID WC   insulin aspart  0-5 Units Subcutaneous QHS   insulin glargine-yfgn  25 Units Subcutaneous Daily   lipase/protease/amylase  36,000 Units Oral TID WC   mometasone-formoterol  2 puff Inhalation BID   montelukast  10 mg Oral QHS   pantoprazole  40 mg Oral Daily   potassium chloride  40 mEq Oral BID   rosuvastatin  20 mg Oral QHS    Continuous Infusions:     LOS: 2 days   Shelly Coss, MD [facility] P8/26/2023, 11:26 AM

## 2021-12-15 NOTE — Progress Notes (Signed)
  Echocardiogram 2D Echocardiogram has been performed.  Darlina Sicilian M 12/15/2021, 11:42 AM

## 2021-12-15 NOTE — Plan of Care (Signed)
  Problem: Health Behavior/Discharge Planning: Goal: Ability to manage health-related needs will improve Outcome: Progressing   Problem: Clinical Measurements: Goal: Ability to maintain clinical measurements within normal limits will improve Outcome: Progressing   Problem: Clinical Measurements: Goal: Diagnostic test results will improve Outcome: Progressing   Problem: Activity: Goal: Risk for activity intolerance will decrease Outcome: Progressing   Problem: Nutrition: Goal: Adequate nutrition will be maintained Outcome: Progressing

## 2021-12-15 NOTE — Evaluation (Signed)
Physical Therapy Evaluation Patient Details Name: Anna Garcia MRN: [id number] DOB: 1980/05/07 Today's Date: 12/15/2021  History of Present Illness  Pt is a 41 yo female admitted just after recent d/c with difficulty walking and fatigue.  Pt on DKA protocol.  PT iwth DM, fibromyalgia, chronic pain syndrome, HTN, chronic urinary retention with self caths.  Clinical Impression  On eval, pt was Min guard-Mod Ind with mobility. Observed pt ambulating hallways with a visitor upon my arrival-no assistance or assistive device. I walked with pt and assessed her gait for remainder of distance. She had one instance of LOB when she turned to look behind her at an employee coming through with a piece of equipment. Discussed DME recommendation made by OT (rolling walker vs rollator)-brought one up for her to take a look at. She continues to state that she doesn't feel a walker will be suitable for her environment or helpful to her. She seems to be looking for something she can sit on AND scoot around on which is not the intended use of a rollator (stationary sitting, yes...mobile transport, no). She reports she does have a cane that she can use for stability purposes if needed. If pt changes her mind and decides she would like to have a RW or rollator, we can recommend one for her. I do feel she would benefit from HHPT f/u for a home assessment and possibly for general strength and balance training.      Recommendations for follow up therapy are one component of a multi-disciplinary discharge planning process, led by the attending physician.  Recommendations may be updated based on patient status, additional functional criteria and insurance authorization.  Follow Up Recommendations Home health PT      Assistance Recommended at Discharge Intermittent Supervision/Assistance  Patient can return home with the following  A little help with walking and/or transfers;A little help with  bathing/dressing/bathroom;Assistance with cooking/housework;Assist for transportation;Help with stairs or ramp for entrance    Equipment Recommendations None recommended by PT (pt continues to state she doesn't feel a walker will be useful for her)  Recommendations for Other Services  OT consult    Functional Status Assessment Patient has had a recent decline in their functional status and demonstrates the ability to make significant improvements in function in a reasonable and predictable amount of time.     Precautions / Restrictions Precautions Precautions: Fall Precaution Comments: Recent falls-pt reports feeling "foggy headed" Restrictions Weight Bearing Restrictions: No      Mobility  Bed Mobility               General bed mobility comments: OOB ambulating with visitor    Transfers Overall transfer level: Needs assistance Equipment used: None Transfers: Sit to/from Stand     Step pivot transfers: Supervision       General transfer comment: Supv for safety    Ambulation/Gait Ambulation/Gait assistance: Min guard Gait Distance (Feet): 125 Feet   Gait Pattern/deviations: Step-through pattern, Decreased stride length       General Gait Details: Observed pt walking the hallway with a visitor-slow but fairly steady gait pattern-no assistance. Therapist stepped in to assess gait for last ~125 feet. Once LOB when pt turned to look behind her and speak with an employee.  Stairs            Wheelchair Mobility    Modified Rankin (Stroke Patients Only)       Balance Overall balance assessment: History of Falls, Needs assistance  Standing balance-Leahy Scale: Fair                               Pertinent Vitals/Pain Pain Assessment Pain Assessment: No/denies pain    Home Living Family/patient expects to be discharged to:: Private residence Living Arrangements: Alone   Type of Home: Apartment Home Access: Level entry        Home Layout: One level Home Equipment: Cane - single point      Prior Function Prior Level of Function : History of Falls (last six months);Needs assist             Mobility Comments: Pt has had slow decline over last few weeks with mobility.  States she has been falling more but does not use walker       Hand Dominance        Extremity/Trunk Assessment   Upper Extremity Assessment Upper Extremity Assessment: Defer to OT evaluation    Lower Extremity Assessment Lower Extremity Assessment: Generalized weakness    Cervical / Trunk Assessment Cervical / Trunk Assessment: Normal  Communication   Communication: No difficulties  Cognition Arousal/Alertness: Awake/alert (drowsy towards end of session-NT reports blood sugar level of 60) Behavior During Therapy: WFL for tasks assessed/performed                                   General Comments: Pt appears generally intact with cognition        General Comments      Exercises     Assessment/Plan    PT Assessment Patient needs continued PT services  PT Problem List Decreased mobility       PT Treatment Interventions DME instruction;Gait training;Therapeutic activities;Therapeutic exercise;Patient/family education;Balance training;Functional mobility training    PT Goals (Current goals can be found in the Care Plan section)  Acute Rehab PT Goals Patient Stated Goal: none stated PT Goal Formulation: With patient Time For Goal Achievement: 12/29/21 Potential to Achieve Goals: Good    Frequency Min 3X/week     Co-evaluation               AM-PAC PT "6 Clicks" Mobility  Outcome Measure Help needed turning from your back to your side while in a flat bed without using bedrails?: None Help needed moving from lying on your back to sitting on the side of a flat bed without using bedrails?: None Help needed moving to and from a bed to a chair (including a wheelchair)?: A Little Help  needed standing up from a chair using your arms (e.g., wheelchair or bedside chair)?: A Little Help needed to walk in hospital room?: A Little Help needed climbing 3-5 steps with a railing? : A Little 6 Click Score: 20    End of Session   Activity Tolerance: Patient tolerated treatment well Patient left: in chair;with call bell/phone within reach   PT Visit Diagnosis: History of falling (Z91.81);Difficulty in walking, not elsewhere classified (R26.2)    Time: 6256-3893 PT Time Calculation (min) (ACUTE ONLY): 19 min   Charges:   PT Evaluation $PT Eval Low Complexity: [place], PT Acute Rehabilitation  Office: [phone] Pager: [phone]

## 2021-12-16 LAB — GLUCOSE, CAPILLARY
Glucose-Capillary: 86 mg/dL (ref 70–99)
Glucose-Capillary: 156 mg/dL — ABNORMAL HIGH (ref 70–99)
Glucose-Capillary: 162 mg/dL — ABNORMAL HIGH (ref 70–99)
Glucose-Capillary: 60 mg/dL — ABNORMAL LOW (ref 70–99)
Glucose-Capillary: 98 mg/dL (ref 70–99)

## 2021-12-16 LAB — BASIC METABOLIC PANEL
Anion gap: 10 (ref 5–15)
BUN: 12 mg/dL (ref 6–20)
CO2: 21 mmol/L — ABNORMAL LOW (ref 22–32)
Calcium: 8.1 mg/dL — ABNORMAL LOW (ref 8.9–10.3)
Chloride: 103 mmol/L (ref 98–111)
Creatinine, Ser: 1.13 mg/dL — ABNORMAL HIGH (ref 0.44–1.00)
GFR, Estimated: >60 mL/min (ref >60)
Glucose, Bld: 169 mg/dL — ABNORMAL HIGH (ref 70–99)
Potassium: 4 mmol/L (ref 3.5–5.1)
Sodium: 134 mmol/L — ABNORMAL LOW (ref 135–145)

## 2021-12-16 LAB — CBC
HCT: 25.3 % — ABNORMAL LOW (ref 36.0–46.0)
Hemoglobin: 8.3 g/dL — ABNORMAL LOW (ref 12.0–15.0)
MCH: 35.5 pg — ABNORMAL HIGH (ref 26.0–34.0)
MCHC: 32.8 g/dL (ref 30.0–36.0)
MCV: 108.1 fL — ABNORMAL HIGH (ref 80.0–100.0)
Platelets: 176 10*3/uL (ref 150–400)
RBC: 2.34 MIL/uL — ABNORMAL LOW (ref 3.87–5.11)
RDW: 19.8 % — ABNORMAL HIGH (ref 11.5–15.5)
WBC: 8.4 10*3/uL (ref 4.0–10.5)
nRBC: 0 % (ref 0.0–0.2)

## 2021-12-16 MED ORDER — FUROSEMIDE 10 MG/ML IJ SOLN
40.0000 mg | Freq: Once | INTRAMUSCULAR | Status: DC
Start: 2021-12-16 — End: 2021-12-16
  Filled 2021-12-16: qty 4

## 2021-12-16 MED ORDER — FUROSEMIDE 10 MG/ML IJ SOLN
100.0000 mg | Freq: Two times a day (BID) | INTRAVENOUS | Status: DC
  Filled 2021-12-16 (×2): qty 10

## 2021-12-16 MED ORDER — LORAZEPAM 0.5 MG PO TABS
0.5000 mg | ORAL_TABLET | Freq: Four times a day (QID) | ORAL | Status: DC | PRN
  Administered 2021-12-16 – 2021-12-17 (×4): 0.5 mg via ORAL
  Filled 2021-12-16 (×4): qty 1

## 2021-12-16 MED ORDER — FUROSEMIDE 10 MG/ML IJ SOLN
80.0000 mg | Freq: Two times a day (BID) | INTRAMUSCULAR | Status: DC
  Administered 2021-12-16 – 2021-12-17 (×3): 80 mg via INTRAVENOUS
  Filled 2021-12-16 (×3): qty 8

## 2021-12-16 MED ORDER — INSULIN GLARGINE-YFGN 100 UNIT/ML ~~LOC~~ SOLN
8.0000 [IU] | Freq: Two times a day (BID) | SUBCUTANEOUS | Status: DC
  Administered 2021-12-16 – 2021-12-18 (×5): 8 [IU] via SUBCUTANEOUS
  Filled 2021-12-16 (×6): qty 0.08

## 2021-12-16 MED ORDER — INSULIN ASPART 100 UNIT/ML IJ SOLN
0.0000 [IU] | Freq: Three times a day (TID) | INTRAMUSCULAR | Status: DC
  Administered 2021-12-16 (×2): 2 [IU] via SUBCUTANEOUS
  Administered 2021-12-17: 3 [IU] via SUBCUTANEOUS
  Administered 2021-12-18 (×2): 2 [IU] via SUBCUTANEOUS

## 2021-12-16 MED ORDER — DEXTROSE 5 % IV SOLN
100.0000 mg | Freq: Two times a day (BID) | INTRAVENOUS | Status: DC
Start: 2021-12-16 — End: 2021-12-16
  Filled 2021-12-16: qty 10

## 2021-12-16 MED ORDER — MIDODRINE HCL 5 MG PO TABS
5.0000 mg | ORAL_TABLET | Freq: Three times a day (TID) | ORAL | Status: DC
  Administered 2021-12-16 – 2021-12-18 (×8): 5 mg via ORAL
  Filled 2021-12-16 (×8): qty 1

## 2021-12-16 MED ORDER — INSULIN ASPART 100 UNIT/ML IJ SOLN
3.0000 [IU] | Freq: Three times a day (TID) | INTRAMUSCULAR | Status: DC
  Administered 2021-12-16: 3 [IU] via SUBCUTANEOUS

## 2021-12-16 NOTE — Plan of Care (Signed)
  Problem: Health Behavior/Discharge Planning: Goal: Ability to manage health-related needs will improve Outcome: Progressing   Problem: Clinical Measurements: Goal: Ability to maintain clinical measurements within normal limits will improve Outcome: Progressing   Problem: Clinical Measurements: Goal: Will remain free from infection Outcome: Progressing   Problem: Clinical Measurements: Goal: Diagnostic test results will improve Outcome: Progressing   Problem: Activity: Goal: Risk for activity intolerance will decrease Outcome: Progressing   Problem: Nutrition: Goal: Adequate nutrition will be maintained Outcome: Progressing   Problem: Coping: Goal: Level of anxiety will decrease Outcome: Progressing

## 2021-12-16 NOTE — Progress Notes (Addendum)
PROGRESS NOTE  Anna Garcia  WUJ:[id number] DOB: Apr 16, 1981 DOA: 12/11/2021 PCP: Anna Kaufmann, MD   Brief Narrative: Patient is a 41 year old female with history of uncontrolled type 1 diabetes, diabetic gastroparesis, chronic pain syndrome, fibromyalgia, hypertension, chronic urinary retention with self cath at home who presents here with complaint of fatigue, difficulty walking.  She was just discharged from here on 8/22 after being managed for DKA, nausea, abdominal pain. On presentation, she was hyperglycemic.  Lab work showed anion gap of 17, bicarb of 13.  Started on IV insulin and DKA protocol.  Her gap has closed, she has been transitioned to long-acting and sliding scale insulin.  Hospital course remarkable for persistent sinus tachycardia, severe volume overload, lower extremity edema.Started on iv Lasix .  Assessment & Plan:  Principal Problem:   DKA (diabetic ketoacidosis) ([place]) Active Problems:   Gastroparesis due to secondary diabetes ([place])   Hypokalemia   GERD (gastroesophageal reflux disease)   AKI (acute kidney injury) ([place])   Hypertension   Macrocytic anemia   Chronic pain syndrome  DKA/diabetes type 1: Recently admitted for the same.  Hemoglobin A1c of 7.6 as per 11/18/2021.  Initially started on IV insulin.  Gap is closed.  Long-acting insulin resumed.  Monitor blood sugars.  She follows with endocrinology. Blood sugars were low this morning, insulin adjusted  AKI: Resolved with IV fluids  Severe volume overload/bilateral lower extremity edema: Bilateral lower extremity pitting edema.  It happens when she gets IV fluids. BNP normal.Venous Doppler did not show any DVT.  Echo showed EF of 60 to 65%, indeterminate left ventricular diastolic parameters. She has low albumin which could have contributed. We will continue lasix iv for now.Ace wraps.Elevate legs. Will be careful with blood pressure  Liver cirrhosis/elevated liver enzymes: Ultrasound of the abdomen did not  show any cholelithiasis or choledocholithiasis.  But showed changes of liver cirrhosis with hepatic steatosis. She says she is drinking liquor every day..  We will also check antimitochondrial antibodies, anti-smooth muscle antibodies, rheumatoid factor, ANA, ceruloplasmin.  But this could be alcohol cirrhosis. Suggested follow-up with hepatology as an outpatient  Diabetic gastroparesis/nausea/abdominal pain: Abdominal pain, nausea or vomiting resolved.     Severe Hypokalemia: Being supplemented with potassium  GERD: Continue PPI  Hyperlipidemia: Continue Crestor  Hypertension/hypotension: Blood pressure soft.  Losartan on hold.  Started on midodrine  Chronic urinary retention: Does self cath at home.  Chronic macrocytic anemia: Currently hemoglobin in the range of 7-8.  Hemoglobin has ranged from 7 to 11 in the last several weeks.  Also has chronic thrombocytopenia, likely from liver cirrhosis.  Vitamin N56 and folic acid normal as per 09/23/2021  Obesity: BMI 33.7          DVT prophylaxis:SCDs Start: 12/12/21 0424     Code Status: Full Code  Family Communication: None at bedside  Patient status:Inpatient  Patient is from : Home  Anticipated discharge to: Home  Estimated DC date: 1-2 days.  Still has severe bilateral lower extremity edema.  Needs to continue IV Lasix   Consultants: None  Procedures: None  Antimicrobials:  Anti-infectives (From admission, onward)    None       Subjective: Patient seen and examined at the bedside this morning.  Hemodynamically stable, though blood pressure is soft.  She continues to have severe bilateral lower extremity pitting edema not responding to Lasix.  We increased the dose of Lasix today  Objective: Vitals:   12/15/21 2344 12/16/21 0400 12/16/21 0801 12/16/21 2130  BP: 99/68   95/64  Pulse: (!) 109   (!) 114  Resp: 18 16    Temp: 98.1 F (36.7 C)     TempSrc: Oral     SpO2: 100%  98%   Weight:      Height:         Intake/Output Summary (Last 24 hours) at 12/16/2021 1101 Last data filed at 12/16/2021 1000 Gross per 24 hour  Intake 1200 ml  Output 2100 ml  Net -900 ml    Filed Weights   12/11/21 2313 12/12/21 0921  Weight: 83.9 kg 89.1 kg    Examination:  General exam: Overall comfortable, not in distress,obese HEENT: PERRL Respiratory system:  no wheezes or crackles  Cardiovascular system: S1 & S2 heard, RRR.  Gastrointestinal system: Abdomen is nondistended, soft and nontender. Central nervous system: Alert and oriented Extremities: Severe bilateral pitting edema, no clubbing ,no cyanosis Skin: No rashes, no icterus  , bulla on the right leg   Data Reviewed: I have personally reviewed following labs and imaging studies  CBC: Recent Labs  Lab 12/11/21 2319 12/12/21 0903 12/14/21 0536 12/16/21 0117  WBC 7.6 6.6 6.4 8.4  NEUTROABS 4.3  --   --   --   HGB 9.5* 7.4* 7.9* 8.3*  HCT 28.4* 22.2* 24.4* 25.3*  MCV 104.8* 106.2* 108.4* 108.1*  PLT 136* 99* 122* 268   Basic Metabolic Panel: Recent Labs  Lab 12/10/21 0559 12/11/21 0430 12/11/21 2319 12/12/21 0903 12/12/21 1309 12/13/21 0947 12/14/21 0536 12/14/21 1505 12/15/21 0607 12/16/21 0117  NA 133* 133*   < > 136   < > 135 138 139 139 134*  K 4.4 3.1*   < > 3.1*   < > 3.6 2.4* 2.8* 3.2* 4.0  CL 108 105   < > 110   < > 110 110 107 109 103  CO2 14* 17*   < > 17*   < > 19* 21* 22 21* 21*  GLUCOSE 300* 361*   < > 179*   < > 153* 175* 102* 76 169*  BUN 8 10   < > 9   < > '7 8 9 11 12  '$ CREATININE 1.16* 1.33*   < > 1.05*   < > 0.97 1.16* 1.18* 1.02* 1.13*  CALCIUM 8.6* 8.7*   < > 8.4*   < > 8.4* 8.4* 8.4* 8.3* 8.1*  MG 1.9 1.8  --  1.8  --   --  1.9  --   --   --   PHOS 2.8 2.5  --   --   --   --   --   --   --   --    < > = values in this interval not displayed.     No results found for this or any previous visit (from the past 240 hour(s)).    Radiology Studies: ECHOCARDIOGRAM COMPLETE  Result Date: 12/15/2021     ECHOCARDIOGRAM REPORT   Patient Name:   Anna Garcia Date of Exam: 12/15/2021 Medical Rec #:  [id number]    Height:       64.0 in Accession #:    [id number]   Weight:       196.4 lb Date of Birth:  1980/08/30     BSA:          1.941 m Patient Age:    41 years     BP:           106/78 mmHg  Patient Gender: F            HR:           102 bpm. Exam Location:  Inpatient Procedure: 2D Echo, Cardiac Doppler and Color Doppler Indications:    CHF-Acute Diastolic O87.86  History:        Patient has prior history of Echocardiogram examinations, most                 recent 05/22/2021. Risk Factors:Diabetes and Hypertension. Severe                 volume overload/bilateral lower extremity edema. Liver cirrhosis                 with hepatic steatosis. Chronic macrocytic anemia.  Sonographer:    Darlina Sicilian RDCS Referring Phys: [id number] [place]  Sonographer Comments: Suboptimal apical window due to limited patient mobility. IMPRESSIONS  1. Left ventricular ejection fraction, by estimation, is 60 to 65%. The left ventricle has normal function. The left ventricle has no regional wall motion abnormalities. Left ventricular diastolic parameters are indeterminate.  2. Right ventricular systolic function is normal. The right ventricular size is normal. Tricuspid regurgitation signal is inadequate for assessing PA pressure.  3. No evidence of mitral valve regurgitation.  4. The aortic valve was not well visualized. Aortic valve regurgitation is not visualized.  5. IVC is not well visualzied. Comparison(s): No significant change from prior study. FINDINGS  Left Ventricle: Left ventricular ejection fraction, by estimation, is 60 to 65%. The left ventricle has normal function. The left ventricle has no regional wall motion abnormalities. The left ventricular internal cavity size was normal in size. There is  no left ventricular hypertrophy. Left ventricular diastolic parameters are indeterminate. Right Ventricle: The right ventricular  size is normal. Right ventricular systolic function is normal. Tricuspid regurgitation signal is inadequate for assessing PA pressure. Left Atrium: Left atrial size was normal in size. Right Atrium: Right atrial size was normal in size. Pericardium: There is no evidence of pericardial effusion. Mitral Valve: No evidence of mitral valve regurgitation. Tricuspid Valve: The tricuspid valve is normal in structure. Tricuspid valve regurgitation is not demonstrated. Aortic Valve: The aortic valve was not well visualized. Aortic valve regurgitation is not visualized. Pulmonic Valve: Pulmonic valve regurgitation is not visualized. Aorta: The aortic root and ascending aorta are structurally normal, with no evidence of dilitation. Venous: IVC is not well visualzied.  LEFT VENTRICLE PLAX 2D LVIDd:         4.60 cm   Diastology LVIDs:         3.20 cm   LV e' medial:    7.18 cm/s LV PW:         0.80 cm   LV E/e' medial:  7.6 LV IVS:        0.85 cm   LV e' lateral:   7.29 cm/s LVOT diam:     1.80 cm   LV E/e' lateral: 7.5 LV SV:         33 LV SV Index:   17 LVOT Area:     2.54 cm  LEFT ATRIUM             Index        RIGHT ATRIUM          Index LA diam:        3.00 cm 1.55 cm/m   RA Area:     6.47 cm LA Vol (A2C):   31.3  ml 16.12 ml/m  RA Volume:   8.99 ml  4.63 ml/m LA Vol (A4C):   33.6 ml 17.31 ml/m LA Biplane Vol: 33.2 ml 17.10 ml/m  AORTIC VALVE LVOT Vmax:   80.90 cm/s LVOT Vmean:  59.500 cm/s LVOT VTI:    0.130 m  AORTA Ao Root diam: 2.70 cm Ao Asc diam:  2.50 cm MITRAL VALVE MV Area (PHT): 3.46 cm    SHUNTS MV Decel Time: 219 msec    Systemic VTI:  0.13 m MV E velocity: 54.80 cm/s  Systemic Diam: 1.80 cm MV A velocity: 52.70 cm/s MV E/A ratio:  1.04 Landscape architect signed by Phineas Inches Signature Date/Time: 12/15/2021/1:46:34 PM    Final     Scheduled Meds:  dicyclomine  10 mg Oral TID AC & HS   famotidine  40 mg Oral BID   ferrous sulfate  325 mg Oral Q breakfast   furosemide  80 mg Intravenous Q12H    gabapentin  300 mg Oral TID   insulin aspart  0-9 Units Subcutaneous TID WC   insulin aspart  3 Units Subcutaneous TID WC   insulin glargine-yfgn  8 Units Subcutaneous BID   lipase/protease/amylase  36,000 Units Oral TID WC   midodrine  5 mg Oral TID WC   mometasone-formoterol  2 puff Inhalation BID   montelukast  10 mg Oral QHS   pantoprazole  40 mg Oral Daily   potassium chloride  40 mEq Oral BID   rosuvastatin  20 mg Oral QHS   Continuous Infusions:     LOS: 3 days   Shelly Coss, MD [facility] P8/27/2023, 11:01 AM

## 2021-12-17 ENCOUNTER — Encounter (HOSPITAL_COMMUNITY): Payer: Self-pay | Admitting: Internal Medicine

## 2021-12-17 ENCOUNTER — Inpatient Hospital Stay (HOSPITAL_COMMUNITY): Payer: Medicare Other

## 2021-12-17 DIAGNOSIS — F05 Delirium due to known physiological condition: Secondary | ICD-10-CM

## 2021-12-17 LAB — GLUCOSE, CAPILLARY
Glucose-Capillary: 69 mg/dL — ABNORMAL LOW (ref 70–99)
Glucose-Capillary: 184 mg/dL — ABNORMAL HIGH (ref 70–99)
Glucose-Capillary: 144 mg/dL — ABNORMAL HIGH (ref 70–99)
Glucose-Capillary: 115 mg/dL — ABNORMAL HIGH (ref 70–99)

## 2021-12-17 LAB — URINALYSIS, ROUTINE W REFLEX MICROSCOPIC
Bilirubin Urine: NEGATIVE
Glucose, UA: NEGATIVE mg/dL
Ketones, ur: NEGATIVE mg/dL
Nitrite: NEGATIVE
Protein, ur: NEGATIVE mg/dL
Specific Gravity, Urine: 1.004 — ABNORMAL LOW (ref 1.005–1.030)
pH: 5 (ref 5.0–8.0)

## 2021-12-17 LAB — COMPREHENSIVE METABOLIC PANEL
ALT: 37 U/L (ref 0–44)
AST: 53 U/L — ABNORMAL HIGH (ref 15–41)
Albumin: 2.7 g/dL — ABNORMAL LOW (ref 3.5–5.0)
Alkaline Phosphatase: 196 U/L — ABNORMAL HIGH (ref 38–126)
Anion gap: 6 (ref 5–15)
BUN: 13 mg/dL (ref 6–20)
CO2: 23 mmol/L (ref 22–32)
Calcium: 8.2 mg/dL — ABNORMAL LOW (ref 8.9–10.3)
Chloride: 105 mmol/L (ref 98–111)
Creatinine, Ser: 1.21 mg/dL — ABNORMAL HIGH (ref 0.44–1.00)
GFR, Estimated: 58 mL/min — ABNORMAL LOW (ref >60)
Glucose, Bld: 99 mg/dL (ref 70–99)
Potassium: 2.9 mmol/L — ABNORMAL LOW (ref 3.5–5.1)
Sodium: 134 mmol/L — ABNORMAL LOW (ref 135–145)
Total Bilirubin: 1.5 mg/dL — ABNORMAL HIGH (ref 0.3–1.2)
Total Protein: 6.2 g/dL — ABNORMAL LOW (ref 6.5–8.1)

## 2021-12-17 MED ORDER — GABAPENTIN 100 MG PO CAPS
100.0000 mg | ORAL_CAPSULE | Freq: Three times a day (TID) | ORAL | Status: DC
  Administered 2021-12-17 – 2021-12-18 (×4): 100 mg via ORAL
  Filled 2021-12-17 (×4): qty 1

## 2021-12-17 NOTE — Progress Notes (Signed)
Pt refused pelvis xray. Pt told xray tech, " I do not need a xray, who ordered it? I am not hurting, I do not need."

## 2021-12-17 NOTE — Inpatient Diabetes Management (Signed)
Inpatient Diabetes Program Recommendations  AACE/ADA: New Consensus Statement on Inpatient Glycemic Control (2015)  Target Ranges:  Prepandial:   less than 140 mg/dL      Peak postprandial:   less than 180 mg/dL (1-2 hours)      Critically ill patients:  140 - 180 mg/dL   Lab Results  Component Value Date   GLUCAP 184 (H) 12/17/2021   HGBA1C 7.6 (H) 11/18/2021    Review of Glycemic Control  Diabetes history: DM1 Outpatient Diabetes medications: Lantus 7 QHS, Apidra 0-8 prn Current orders for Inpatient glycemic control: Semglee 8 units BID, Novolog 0-9 units TID with meals + 3 units TID  HgbA1C - 7.6%  Inpatient Diabetes Program Recommendations:    Spoke with pt at bedside and seems different than previous admissions. Appears slow to respond and slightly confused. Has had mild hypoglycemia of 69 this am.  Had 60 mg/dL at HS last night after receiving total of 5 units of Novolog.  May need to reduce Novolog meal coverage to 2 units TID if eating > 50%.  Discussed slow response with RN.  Will f/u in am.   Thank you. Lorenda Peck, RD, LDN, [place] Inpatient Diabetes Coordinator [phone]

## 2021-12-17 NOTE — Progress Notes (Signed)
OT Cancellation Note  Patient Details Name: Anna Garcia MRN: [id number] DOB: May 24, 1980   Cancelled Treatment:    Reason Eval/Treat Not Completed: Other (comment) Patient in room with unit supervisor at this time. OT to continue to follow and check back as schedule will allow.  Rennie Plowman, [place] Acute Rehabilitation Department Office# [phone]  12/17/2021, 2:49 PM

## 2021-12-17 NOTE — Care Management Important Message (Signed)
Important Message  Patient Details IM Letter given to the Patient. Name: Anna Garcia MRN: [id number] Date of Birth: 12-16-1980   Medicare Important Message Given:  Yes     Kerin Salen 12/17/2021, 10:45 AM

## 2021-12-17 NOTE — Progress Notes (Signed)
PROGRESS NOTE  Anna Garcia  DGL:[id number] DOB: December 19, 1980 DOA: 12/11/2021 PCP: [name], MD   Brief Narrative: Patient is a 41 year old female with history of uncontrolled type 1 diabetes, diabetic gastroparesis, chronic pain syndrome, fibromyalgia, hypertension, chronic urinary retention with self cath at home who presents here with complaint of fatigue, difficulty walking.  She was just discharged from here on 8/22 after being managed for DKA, nausea, abdominal pain. On presentation, she was hyperglycemic.  Lab work showed anion gap of 17, bicarb of 13.  Started on IV insulin and DKA protocol.  Her gap has closed, she has been transitioned to long-acting and sliding scale insulin.  Hospital course remarkable for persistent sinus tachycardia, severe volume overload, lower extremity edema.Started on iv Lasix .  Patient became increasingly confused, also had a fall in the room.  Psychiatry consulted today for severe anxiety/depression with psychotic symptoms.  Assessment & Plan:  Principal Problem:   DKA (diabetic ketoacidosis) ([place]) Active Problems:   Gastroparesis due to secondary diabetes ([place])   Hypokalemia   GERD (gastroesophageal reflux disease)   AKI (acute kidney injury) ([place])   Hypertension   Macrocytic anemia   Chronic pain syndrome  DKA/diabetes type 1: Recently admitted for the same.  Hemoglobin A1c of 7.6 as per 11/18/2021.  Initially started on IV insulin.  Gap is closed.  Long-acting insulin resumed.  Monitor blood sugars.  She follows with endocrinology. Diabetic  coordinator following  AKI: Mild.  Continue to monitor.  Has good urine output  Severe volume overload/bilateral lower extremity edema: Bilateral lower extremity pitting edema.  It happens when she gets IV fluids. BNP normal.Venous Doppler did not show any DVT.  Echo showed EF of 60 to 65%, indeterminate left ventricular diastolic parameters. She has low albumin which could have contributed. We will continue  lasix iv for now.continue compression stockings.   Liver cirrhosis/elevated liver enzymes: Ultrasound of the abdomen did not show any cholelithiasis or choledocholithiasis.  But showed changes of liver cirrhosis with hepatic steatosis. She says she is drinking liquor every day.  We will also check antimitochondrial antibodies, anti-smooth muscle antibodies, rheumatoid factor, ANA, ceruloplasmin.  But this could be alcohol cirrhosis. We recommend to  follow-up with hepatology as an outpatient  Anxiety/depression/confusion: Patient is extremely anxious, tearful since yesterday.  This morning she was found to be confused, also found to have a fall in the room.  Narcotics could have contributed.  UDS pending.  Dose of gabapentin reduced.  Psychiatry consulted. Head CT did not show any acute findings  Diabetic gastroparesis/nausea/abdominal pain: Abdominal pain, nausea or vomiting resolved.     Severe Hypokalemia: Being supplemented with potassium  GERD: Continue PPI  Hyperlipidemia: Continue Crestor  Hypertension/hypotension: Blood pressure soft.  Losartan on hold.  Started on midodrine  Chronic urinary retention: Does self cath at home.  Chronic macrocytic anemia: Currently hemoglobin in the range of 7-8.  Hemoglobin has ranged from 7 to 11 in the last several weeks.  Also has chronic thrombocytopenia, likely from liver cirrhosis.  Vitamin J18 and folic acid normal as per 09/23/2021  Obesity: BMI 33.7  Debility/deconditioning: Patient seen by PT and recommended home health on discharge          DVT prophylaxis:SCDs Start: 12/12/21 0424     Code Status: Full Code  Family Communication: None at bedside  Patient status:Inpatient  Patient is from : Home  Anticipated discharge to: Home  Estimated DC date: 1-2 days.  Still has severe bilateral lower extremity  edema.  Needs to continue IV Lasix.  Psych  consultation awaiting   consultants: Psych   Procedures:  None  Antimicrobials:  Anti-infectives (From admission, onward)    None       Subjective: Patient seen and examined at the bedside this morning.  Hemodynamically stable.  She was very upset and tearful about situation.  She has been put on chair/bed alarm.  There is noise every time she tries to move.  She thinks that she has been treated like a prisoner.  Apparently,  patient also found to be slightly confused.  Objective: Vitals:   12/17/21 0209 12/17/21 0533 12/17/21 0840 12/17/21 1247  BP: 92/63 92/61  110/80  Pulse: (!) 117 100  100  Resp: '16 14 15 16  '$ Temp: 98.2 F (36.8 C) 97.9 F (36.6 C)  97.8 F (36.6 C)  TempSrc: Oral Oral  Oral  SpO2: 97% 97% 97% 98%  Weight:      Height:        Intake/Output Summary (Last 24 hours) at 12/17/2021 1304 Last data filed at 12/17/2021 1100 Gross per 24 hour  Intake 200 ml  Output 1800 ml  Net -1600 ml    Filed Weights   12/11/21 2313 12/12/21 0921  Weight: 83.9 kg 89.1 kg    Examination:  General exam: Anxious, tearful, hemodynamically stable, HEENT: PERRL Respiratory system:  no wheezes or crackles  Cardiovascular system: S1 & S2 heard, RRR.  Gastrointestinal system: Abdomen is nondistended, soft and nontender. Central nervous system: Alert and awake, confused to time Extremities: Severe lateral lower extremity pitting edema, no clubbing ,no cyanosis Skin: No rashes, no ulcers,no icterus     Data Reviewed: I have personally reviewed following labs and imaging studies  CBC: Recent Labs  Lab 12/11/21 2319 12/12/21 0903 12/14/21 0536 12/16/21 0117  WBC 7.6 6.6 6.4 8.4  NEUTROABS 4.3  --   --   --   HGB 9.5* 7.4* 7.9* 8.3*  HCT 28.4* 22.2* 24.4* 25.3*  MCV 104.8* 106.2* 108.4* 108.1*  PLT 136* 99* 122* 696   Basic Metabolic Panel: Recent Labs  Lab 12/11/21 0430 12/11/21 2319 12/12/21 0903 12/12/21 1309 12/14/21 0536 12/14/21 1505 12/15/21 0607 12/16/21 0117 12/17/21 0503  NA 133*   < > 136   < >  138 139 139 134* 134*  K 3.1*   < > 3.1*   < > 2.4* 2.8* 3.2* 4.0 2.9*  CL 105   < > 110   < > 110 107 109 103 105  CO2 17*   < > 17*   < > 21* 22 21* 21* 23  GLUCOSE 361*   < > 179*   < > 175* 102* 76 169* 99  BUN 10   < > 9   < > '8 9 11 12 13  '$ CREATININE 1.33*   < > 1.05*   < > 1.16* 1.18* 1.02* 1.13* 1.21*  CALCIUM 8.7*   < > 8.4*   < > 8.4* 8.4* 8.3* 8.1* 8.2*  MG 1.8  --  1.8  --  1.9  --   --   --   --   PHOS 2.5  --   --   --   --   --   --   --   --    < > = values in this interval not displayed.     No results found for this or any previous visit (from the past 240 hour(s)).  Radiology Studies: CT HEAD WO CONTRAST (5MM)  Result Date: 12/17/2021 CLINICAL DATA:  Delirium. Seated fall last night without head injury. EXAM: CT HEAD WITHOUT CONTRAST TECHNIQUE: Contiguous axial images were obtained from the base of the skull through the vertex without intravenous contrast. RADIATION DOSE REDUCTION: This exam was performed according to the departmental dose-optimization program which includes automated exposure control, adjustment of the mA and/or kV according to patient size and/or use of iterative reconstruction technique. COMPARISON:  None Available. FINDINGS: Brain: No evidence of acute infarction, hemorrhage, hydrocephalus, extra-axial collection or mass lesion/mass effect. Vascular: No hyperdense vessel or unexpected calcification. Skull: Normal. Negative for fracture or focal lesion. Sinuses/Orbits: No acute finding. IMPRESSION: No acute finding.  No evidence of injury. Electronically Signed   By: Jorje Guild M.D.   On: 12/17/2021 12:19    Scheduled Meds:  dicyclomine  10 mg Oral TID AC & HS   famotidine  40 mg Oral BID   ferrous sulfate  325 mg Oral Q breakfast   furosemide  80 mg Intravenous Q12H   gabapentin  100 mg Oral TID   insulin aspart  0-9 Units Subcutaneous TID WC   insulin aspart  3 Units Subcutaneous TID WC   insulin glargine-yfgn  8 Units Subcutaneous BID    lipase/protease/amylase  36,000 Units Oral TID WC   midodrine  5 mg Oral TID WC   mometasone-formoterol  2 puff Inhalation BID   montelukast  10 mg Oral QHS   pantoprazole  40 mg Oral Daily   potassium chloride  40 mEq Oral BID   rosuvastatin  20 mg Oral QHS   Continuous Infusions:     LOS: 4 days   Shelly Coss, MD [facility] P8/28/2023, 1:04 PM

## 2021-12-17 NOTE — Progress Notes (Addendum)
Pt had an unwitnessed fall. PT is A/O x4 and stated she landed on her "butt." She denies pain at this time. MD made aware, pelvis xray ordered. Pt reluctant to have bed alam on as she has refused all night. Call light within place and safety measures intact.   12/17/21 0209  Assess: MEWS Score  Temp 98.2 F (36.8 C)  BP 92/63  MAP (mmHg) 72  Pulse Rate (!) 117  Resp 16  SpO2 97 %  O2 Device Room Air  Assess: MEWS Score  MEWS Temp 0  MEWS Systolic 1  MEWS Pulse 2  MEWS RR 0  MEWS LOC 0  MEWS Score 3  MEWS Score Color Yellow  Assess: if the MEWS score is Yellow or Red  Were vital signs taken at a resting state? No  Focused Assessment No change from prior assessment  Does the patient meet 2 or more of the SIRS criteria? No  Does the patient have a confirmed or suspected source of infection? No  MEWS guidelines implemented *See Row Information* No, previously yellow, continue vital signs every 4 hours  Assess: SIRS CRITERIA  SIRS Temperature  0  SIRS Pulse 1  SIRS Respirations  0  SIRS WBC 1  SIRS Score Sum  2

## 2021-12-17 NOTE — Progress Notes (Signed)
   12/17/21 0200  What Happened  Was fall witnessed? No  Patients activity before fall ambulating-unassisted  Point of contact buttocks  Was patient injured? No  Patient found on floor  Found by Staff-comment Harley Hallmark, RN)  Stated prior activity ambulating-unassisted  Follow Up  MD notified Sena Hitch  Time MD notified [phone]  Family notified No - patient refusal  Progress note created (see row info) Yes  Adult Fall Risk Assessment  Risk Factor Category (scoring not indicated) History of more than one fall within 6 months before admission (document High fall risk)  Age 41  Fall History: Fall within 6 months prior to admission 5  Elimination; Bowel and/or Urine Incontinence 0  Elimination; Bowel and/or Urine Urgency/Frequency 0  Medications: includes PCA/Opiates, Anti-convulsants, Anti-hypertensives, Diuretics, Hypnotics, Laxatives, Sedatives, and Psychotropics 5  Patient Care Equipment 1  Mobility-Assistance 2  Mobility-Gait 0  Mobility-Sensory Deficit 0  Altered awareness of immediate physical environment 0  Impulsiveness 0  Lack of understanding of one's physical/cognitive limitations 4  Total Score 17  Patient Fall Risk Level High fall risk  Adult Fall Risk Interventions  Required Bundle Interventions *See Row Information* High fall risk - low, moderate, and high requirements implemented  Additional Interventions Use of appropriate toileting equipment (bedpan, BSC, etc.)  Screening for Fall Injury Risk (To be completed on HIGH fall risk patients) - Assessing Need for Floor Mats  Risk For Fall Injury- Criteria for Floor Mats Previous fall this admission  Will Implement Floor Mats Yes  Pain Assessment  Pain Scale 0-10  Pain Score 0  Neurological  Neuro (WDL) X  Level of Consciousness Alert  Orientation Level Oriented X4  Cognition Follows commands  Speech Clear;Delayed responses  Glasgow Coma Scale  Eye Opening 4  Best Verbal Response (NON-intubated) 5   Best Motor Response 6  Glasgow Coma Scale Score 15

## 2021-12-17 NOTE — Progress Notes (Signed)
0700-1100 pt is frustrated with bed alarm and floor mats I have provided thorough rationale and education repeatedly I explained to her that I am not able to change protocol surrounding falls. Per unit director, we are not able to modify precautions related to high fall risk and alarms must remain on.  MD at bedside. I shared my concerns for AMS, difficulty with word finding, memory impairment, disorientation, paranoia, and manipulative behavior. When the provider asks pt to recount the night's events, she does not remember falling. She states she entered her room to put her plate away and get some more wine. (Visual search of room; no wine seen) CT head, psych consult, telesitter ordered  1100-1500 Psych unable to complete eval (see note) citing intoxication. Shortly after eval deferred, pt found ambulating in bathroom. She ambulated about the room cleaning and conversing with another teammate. She was able to sit down in the chair and order lunch while remaining alert.  Pt falling asleep in chair leaning forward. She is frequently aroused by her falling motion, alarms, phone ringing. Was able to pick up the phone and hold a conversation; it is unclear if her responses were appropriate as I could not hear the caller.   1500-1900 Remains drowsy and restless. Appears to doze off, especially at rest, when standing, or when using fine motor skills. She never falls asleep and inevitably opens her eyes and attempts to get up, reposition, go to the bathroom, or organize personal belongings. She'll say things like "I'm going to go to my room now" or "I'm going to see what's in the fridge" Hallucinating, pointing to the dead skin on the floor mat, "Do you see that? Are those kittens?" Tearful, asking if her mom is coming to visit. When asked about her mom earlier, she stated that her mom had passed away. Narcotics d/c'd  Later, ambulating about unit with supervision. Continues to ignore or mock  telesitter. Frustrated by alarms and monitoring. LOC seems to be improving gradually with less paranoia. Responses are wittier and more sarcastic, indicative of higher order thinking. I'm able to follow along and participate easier in conversation without having to cue her for words or repeat questions.

## 2021-12-17 NOTE — TOC Progression Note (Signed)
Transition of Care [facility]) - Progression Note    Patient Details  Name: Anna Garcia MRN: [id number] Date of Birth: [birth year]  Transition [facility]) CM/SW [place], LCSW Phone Number: 12/17/2021, 10:36 AM  Clinical Narrative:    Pt has been set up with HHPT/OT/aide/SW through [facility]. Home Health orders will need to be placed prior to discharge. No DME needs identified at this time. Per RN pt has had increased confusion and paranoia. Psych is to be consulted. TOC will continue to follow for further recommendations.    Expected Discharge Plan: Home/Self Care Barriers to Discharge: Continued Medical Work up  Expected Discharge Plan and Services Expected Discharge Plan: Home/Self Care In-house Referral: Clinical Social Work Discharge Planning Services: CM Consult Post Acute Care Choice: [place] arrangements for the past 2 months: Apartment                 DME Arranged: N/A DME Agency: NA       HH Arranged: PT, OT, Nurse's Aide, Social Work [employer] Agency: [place] Date [place]: 12/17/21 Time [place]: 1035 Representative spoke with at [place]: [place] ([place]) Interventions    Readmission Risk Interventions    12/14/2021   10:09 AM 09/24/2021    1:14 PM  Readmission Risk Prevention Plan  Transportation Screening Complete   PCP or Specialist Appt within 3-5 Days Complete   HRI or [place] Complete Complete  Social Work Consult for [place] Planning/Counseling Complete Complete  Palliative Care Screening Not Applicable Not Applicable  Medication Review [employer]) Complete

## 2021-12-17 NOTE — Consult Note (Signed)
  Patient is a 41 year old female with history of uncontrolled type 1 diabetes, diabetic gastroparesis, chronic pain syndrome, fibromyalgia, hypertension, chronic urinary retention with self cath at home who presents here with complaint of fatigue, difficulty walking.  She was just discharged from here on 8/22 after being managed for DKA, nausea, abdominal pain.   Hospital course remarkable for persistent sinus tachycardia, severe volume overload, lower extremity edema.Started on iv Lasix .  Patient became increasingly confused, also had a fall in the room.  Psychiatry consulted today for severe anxiety/depression with psychotic symptoms.   Psych consult placed for severe anxiety/depression with psychotic symptoms Patient seen and attempted to assess, however after 3 attempts we were unsuccessful in completing psychiatric evaluation.  Patient did appear to be under the influence of medication, she continued to doze off in the middle of the sentence.  She was observed with increased head-nodding, and leaning forward with much difficulty remaining awake despite multiple attempts to engage with patient.  Patient did provide limited detailed information to include history of manic depression, alcohol use disorder.  She reports drinking few shots a day every day for the past year.  She denies any history of suicide attempts, homicidal ideations.  She further denies any current acute suicidal ideations, suicidal intentions/thoughts, and or nonsuicidal self-injurious behavior.  She denies homicidal ideations and or auditory visual hallucinations.  At present patient does not appear to be of acute danger to self or others.  Will attempt to reassess patient tomorrow, hopefully she will not be under the influence of multiple controlled substances at that time.

## 2021-12-18 ENCOUNTER — Other Ambulatory Visit (HOSPITAL_COMMUNITY): Payer: Self-pay

## 2021-12-18 LAB — URINE DRUGS OF ABUSE SCREEN W ALC, ROUTINE (REF LAB)
Amphetamines, Urine: NEGATIVE ng/mL
Barbiturate, Ur: NEGATIVE ng/mL
Benzodiazepine Quant, Ur: NEGATIVE ng/mL
Cannabinoid Quant, Ur: NEGATIVE ng/mL
Cocaine (Metab.): NEGATIVE ng/mL
Ethanol U, Quan: NEGATIVE %
Methadone Screen, Urine: NEGATIVE ng/mL
Opiate Quant, Ur: NEGATIVE ng/mL
Phencyclidine, Ur: NEGATIVE ng/mL
Propoxyphene, Urine: NEGATIVE ng/mL

## 2021-12-18 LAB — CERULOPLASMIN: Ceruloplasmin: 9 mg/dL — ABNORMAL LOW (ref 19.0–39.0)

## 2021-12-18 LAB — CBC
HCT: 27.6 % — ABNORMAL LOW (ref 36.0–46.0)
Hemoglobin: 9.1 g/dL — ABNORMAL LOW (ref 12.0–15.0)
MCH: 35.8 pg — ABNORMAL HIGH (ref 26.0–34.0)
MCHC: 33 g/dL (ref 30.0–36.0)
MCV: 108.7 fL — ABNORMAL HIGH (ref 80.0–100.0)
Platelets: 231 10*3/uL (ref 150–400)
RBC: 2.54 MIL/uL — ABNORMAL LOW (ref 3.87–5.11)
RDW: 19.5 % — ABNORMAL HIGH (ref 11.5–15.5)
WBC: 9.1 10*3/uL (ref 4.0–10.5)
nRBC: 0 % (ref 0.0–0.2)

## 2021-12-18 LAB — ANA: Anti Nuclear Antibody (ANA): NEGATIVE

## 2021-12-18 LAB — COMPREHENSIVE METABOLIC PANEL
ALT: 33 U/L (ref 0–44)
AST: 49 U/L — ABNORMAL HIGH (ref 15–41)
Albumin: 2.8 g/dL — ABNORMAL LOW (ref 3.5–5.0)
Alkaline Phosphatase: 190 U/L — ABNORMAL HIGH (ref 38–126)
Anion gap: 11 (ref 5–15)
BUN: 13 mg/dL (ref 6–20)
CO2: 23 mmol/L (ref 22–32)
Calcium: 8.8 mg/dL — ABNORMAL LOW (ref 8.9–10.3)
Chloride: 108 mmol/L (ref 98–111)
Creatinine, Ser: 1.2 mg/dL — ABNORMAL HIGH (ref 0.44–1.00)
GFR, Estimated: 59 mL/min — ABNORMAL LOW (ref >60)
Glucose, Bld: 99 mg/dL (ref 70–99)
Potassium: 3 mmol/L — ABNORMAL LOW (ref 3.5–5.1)
Sodium: 142 mmol/L (ref 135–145)
Total Bilirubin: 1.3 mg/dL — ABNORMAL HIGH (ref 0.3–1.2)
Total Protein: 6.3 g/dL — ABNORMAL LOW (ref 6.5–8.1)

## 2021-12-18 LAB — MITOCHONDRIAL ANTIBODIES: Mitochondrial M2 Ab, IgG: <20 Units (ref 0.0–20.0)

## 2021-12-18 LAB — GLUCOSE, CAPILLARY
Glucose-Capillary: 165 mg/dL — ABNORMAL HIGH (ref 70–99)
Glucose-Capillary: 115 mg/dL — ABNORMAL HIGH (ref 70–99)
Glucose-Capillary: 163 mg/dL — ABNORMAL HIGH (ref 70–99)

## 2021-12-18 LAB — ANTI-SMOOTH MUSCLE ANTIBODY, IGG: F-Actin IgG: 3 Units (ref 0–19)

## 2021-12-18 LAB — RHEUMATOID FACTOR: Rheumatoid fact SerPl-aCnc: <10 IU/mL (ref <14.0)

## 2021-12-18 MED ORDER — FERROUS SULFATE 325 (65 FE) MG PO TABS
325.0000 mg | ORAL_TABLET | Freq: Every day | ORAL | 1 refills | Status: DC
  Filled 2021-12-18: qty 30, 30d supply, fill #0

## 2021-12-18 MED ORDER — POTASSIUM CHLORIDE CRYS ER 20 MEQ PO TBCR
40.0000 meq | EXTENDED_RELEASE_TABLET | Freq: Every day | ORAL | 0 refills | Status: DC
  Filled 2021-12-18: qty 14, 7d supply, fill #0

## 2021-12-18 MED ORDER — DIPHENHYDRAMINE HCL 25 MG PO CAPS: 25.0000 mg | ORAL_CAPSULE | Freq: Four times a day (QID) | ORAL | 0 refills | Status: DC | PRN

## 2021-12-18 MED ORDER — HYDROCORTISONE 0.5 % EX CREA: 1.0000 | TOPICAL_CREAM | Freq: Two times a day (BID) | CUTANEOUS | 0 refills | Status: DC

## 2021-12-18 MED ORDER — METOCLOPRAMIDE HCL 10 MG PO TABS
10.0000 mg | ORAL_TABLET | Freq: Four times a day (QID) | ORAL | 0 refills | Status: DC | PRN
  Filled 2021-12-18 (×3): qty 20, 5d supply, fill #0

## 2021-12-18 MED ORDER — APIDRA SOLOSTAR 100 UNIT/ML ~~LOC~~ SOPN
0.0000 [IU] | PEN_INJECTOR | SUBCUTANEOUS | 1 refills | Status: DC
  Filled 2021-12-18: qty 15, 187d supply, fill #0

## 2021-12-18 MED ORDER — LANTUS SOLOSTAR 100 UNIT/ML ~~LOC~~ SOPN
8.0000 [IU] | PEN_INJECTOR | Freq: Two times a day (BID) | SUBCUTANEOUS | 0 refills | Status: DC
  Filled 2021-12-18 (×2): qty 15, 94d supply, fill #0

## 2021-12-18 MED ORDER — MIDODRINE HCL 5 MG PO TABS
5.0000 mg | ORAL_TABLET | Freq: Three times a day (TID) | ORAL | 1 refills | Status: DC
  Filled 2021-12-18: qty 30, 10d supply, fill #0

## 2021-12-18 NOTE — Progress Notes (Signed)
Notified by MD that IVC was discontinued and patient was being discharged.

## 2021-12-18 NOTE — Progress Notes (Signed)
Received a call from bedside RN regarding the patient attempting to leave [place].  Presented at bedside and evaluated the patient in person.  One-to-one sitter in the room, the patient is alert and confused.  She is not oriented to place and when informed that she is in the hospital she does not appear to believe the Probation officer.  She has no recollection of the events that led to her admission to the hospital.  She is refusing treatment.  The patient does not have capacity to make medical decisions at this time.  IVC applied to avoid harm to self.  The writer called and updated the patient's sister-in-law Aliz Meritt who agrees with IVC.  [name] will visit the patient this morning during visitation hours.

## 2021-12-18 NOTE — Discharge Summary (Addendum)
Physician Discharge Summary  OLISA QUESNEL XIP:[id number] DOB: 11-Aug-1980 DOA: 12/11/2021  PCP: [name], MD  Admit date: 12/11/2021 Discharge date: 12/18/2021  Admitted From: Home Disposition:  Home  Discharge Condition:Stable CODE STATUS:FULL Diet recommendation: Carb Modified  Brief/Interim Summary:  Patient is a 41 year old female with history of uncontrolled type 1 diabetes, diabetic gastroparesis, chronic pain syndrome, fibromyalgia, hypertension, chronic urinary retention with self cath at home who presents here with complaint of fatigue, difficulty walking.  She was just discharged from here on 8/22 after being managed for DKA, nausea, abdominal pain. On presentation, she was hyperglycemic.  Lab work showed anion gap of 17, bicarb of 13.  Started on IV insulin and DKA protocol.  Her gap has closed, she has been transitioned to long-acting and sliding scale insulin.  Hospital course remarkable for persistent sinus tachycardia, severe volume overload, lower extremity edema.Started on iv Lasix .  Patient also became increasingly confused, also had a fall in the room needing to be IVCed..  Psychiatry consulted , now has been cleared so IVC discontinued.  Patient is medically stable for discharge to home today.  Following problems were addressed during hospitalization:  DKA/diabetes type 1: Recently admitted for the same.  Hemoglobin A1c of 7.6 as per 11/18/2021.  Initially started on IV insulin.  Gap is closed.  Long-acting insulin resumed.  Monitor blood sugars.  She follows with endocrinology. Diabetic  coordinator was following   AKI: Mild.  Expect improvement after Lasix has been discontinued   Severe volume overload/bilateral lower extremity edema: Bilateral lower extremity pitting edema.  It happens when she gets IV fluids. BNP normal.Venous Doppler did not show any DVT.  Echo showed EF of 60 to 65%, indeterminate left ventricular diastolic parameters. She has low albumin which  could have contributed. She was given some doses of IV Lasix.Marland Kitchencontinue compression stockings, elevate legs.    Liver cirrhosis/elevated liver enzymes: Ultrasound of the abdomen did not show any cholelithiasis or choledocholithiasis.  But showed changes of liver cirrhosis with hepatic steatosis. She says she is drinking liquor every day.  Negative antimitochondrial antibodies, anti-smooth muscle antibodies, rheumatoid factor, ANA, ceruloplasmin.  But this could be also alcohol cirrhosis or NASH. We recommend to  follow-up with hepatology as an outpatient   Anxiety/depression/confusion: Patient is extremely anxious, tearful since yesterday.  She was found to be confused, also found to have a fall in the room.  Narcotics could have contributed.  Psychiatry consulted.  Cleared to go home.  IVC discontinued. Head CT did not show any acute findings   Diabetic gastroparesis/nausea/abdominal pain: Abdominal pain, nausea or vomiting resolved.  Continue Reglan as needed   Severe Hypokalemia: Being supplemented with potassium   GERD: Continue PPI   Hyperlipidemia: Continue Crestor   Hypertension/hypotension: Blood pressure soft.  Losartan on hold.  Started on midodrine   Chronic urinary retention: Does self cath at home.   Chronic macrocytic anemia: Currently hemoglobin in the range of 7-8.  Hemoglobin has ranged from 7 to 11 in the last several weeks.  Also has chronic thrombocytopenia, likely from liver cirrhosis.  Vitamin Q73 and folic acid normal as per 09/23/2021.  Started on oral iron   Obesity: BMI 33.7   Debility/deconditioning: Patient seen by PT and recommended home health on discharge    Discharge Diagnoses:  Principal Problem:   DKA (diabetic ketoacidosis) ([place]) Active Problems:   Gastroparesis due to secondary diabetes ([place])   Hypokalemia   GERD (gastroesophageal reflux disease)   AKI (acute kidney  injury) ([place])   Hypertension   Macrocytic anemia   Chronic pain syndrome   Acute  confusional state    Discharge Instructions  Discharge Instructions     Diet - low sodium heart healthy   Complete by: As directed    Diet Carb Modified   Complete by: As directed    Discharge instructions   Complete by: As directed    1)Please take prescribed medications as instructed 2)Monitor your blood glucose very carefully at home. Take insulin as instructed 3)Follow up with your PCP in a week.Do a BMP test during the follow-up to check kidney function and potassium level.  Follow up with endocrinology 4)Use compression stocking and elevate ur legs to reduce swelling 5) Quit alcohol.  Follow-up with hepatology as an outpatient for further evaluation of liver cirrhosis   Increase activity slowly   Complete by: As directed    No wound care   Complete by: As directed       Allergies as of 12/18/2021   No Known Allergies      Medication List     STOP taking these medications    losartan 25 MG tablet Commonly known as: COZAAR       TAKE these medications    Apidra SoloStar 100 UNIT/ML Solostar Pen Generic drug: insulin glulisine Inject 0 - 8 Units into the skin daily AS NEEDED if blood glucose levels are trending upwards- changes as per carb intake What changed: additional instructions   Baqsimi Two Pack 3 MG/DOSE Powd Generic drug: Glucagon Place 1 spray into both nostrils as needed (for a very low blood sugar emergency, when unable to eat or drink and need help from someone else- or as otherwise directed).   Dexilant 60 MG capsule Generic drug: dexlansoprazole Take 1 capsule (60 mg total) by mouth daily.   dicyclomine 10 MG capsule Commonly known as: BENTYL Take 1 capsule (10 mg total) by mouth 4 (four) times daily -  before meals and at bedtime.   diphenhydrAMINE 25 mg capsule Commonly known as: Benadryl Allergy Take 1 capsule (25 mg total) by mouth every 6 (six) hours as needed.   famotidine 40 MG tablet Commonly known as: PEPCID Take 40 mg by  mouth 2 (two) times daily.   FeroSul 325 (65 FE) MG tablet Generic drug: ferrous sulfate Take 1 tablet by mouth daily with breakfast. Start taking on: December 19, 2021   gabapentin 300 MG capsule Commonly known as: NEURONTIN Take 1 capsule (300 mg total) by mouth 3 (three) times daily.   granisetron 1 MG tablet Commonly known as: KYTRIL Take 1 mg by mouth every 12 (twelve) hours.   hydrocortisone cream 0.5 % Apply 1 Application topically 2 (two) times daily. Apply on rash on hands   Lantus SoloStar 100 UNIT/ML Solostar Pen Generic drug: insulin glargine Inject 8 Units into the skin 2 times daily. What changed:  how much to take when to take this   lipase/protease/amylase 36000 UNITS Cpep capsule Commonly known as: CREON Take 1 capsule (36,000 Units total) by mouth 3 (three) times daily with meals.   loperamide 2 MG capsule Commonly known as: IMODIUM Take 1 capsule (2 mg total) by mouth as needed for diarrhea or loose stools.   metoCLOPramide 10 MG tablet Commonly known as: REGLAN Take 1 tablet by mouth every 6 hours as needed for nausea.   midodrine 5 MG tablet Commonly known as: PROAMATINE Take 1 tablet by mouth 3 times daily with meals.   mometasone-formoterol  100-5 MCG/ACT Aero Commonly known as: DULERA Inhale 2 puffs into the lungs 2 (two) times daily.   montelukast 10 MG tablet Commonly known as: SINGULAIR Take 10 mg by mouth at bedtime.   Motegrity 1 MG Tabs Generic drug: Prucalopride Succinate Take 1 mg by mouth daily.   potassium chloride SA 20 MEQ tablet Commonly known as: KLOR-CON M Take 2 tablets by mouth once daily for 7 days.   rosuvastatin 20 MG tablet Commonly known as: CRESTOR Take 20 mg by mouth at bedtime.        Follow-up Information     [facility] Follow up.   Specialty: Home Health Services Why: [name] is to provide home health physical and occupational therapy, aide and social work services at home following  discharge. Contact information: [address] [address] [phone]         Day, [name], MD. Schedule an appointment as soon as possible for a visit in 1 week(s).   Specialty: Internal Medicine Contact information: [place] [address] [phone]                No Known Allergies  Consultations: Psychiatry   Procedures/Studies: CT HEAD WO CONTRAST (5MM)  Result Date: 12/17/2021 CLINICAL DATA:  Delirium. Seated fall last night without head injury. EXAM: CT HEAD WITHOUT CONTRAST TECHNIQUE: Contiguous axial images were obtained from the base of the skull through the vertex without intravenous contrast. RADIATION DOSE REDUCTION: This exam was performed according to the departmental dose-optimization program which includes automated exposure control, adjustment of the mA and/or kV according to patient size and/or use of iterative reconstruction technique. COMPARISON:  None Available. FINDINGS: Brain: No evidence of acute infarction, hemorrhage, hydrocephalus, extra-axial collection or mass lesion/mass effect. Vascular: No hyperdense vessel or unexpected calcification. Skull: Normal. Negative for fracture or focal lesion. Sinuses/Orbits: No acute finding. IMPRESSION: No acute finding.  No evidence of injury. Electronically Signed   By: Jorje Guild M.D.   On: 12/17/2021 12:19   ECHOCARDIOGRAM COMPLETE  Result Date: 12/15/2021    ECHOCARDIOGRAM REPORT   Patient Name:   JESSY CALIXTE Date of Exam: 12/15/2021 Medical Rec #:  [id number]    Height:       64.0 in Accession #:    [id number]   Weight:       196.4 lb Date of Birth:  21-Aug-1980     BSA:          1.941 m Patient Age:    26 years     BP:           106/78 mmHg Patient Gender: F            HR:           102 bpm. Exam Location:  Inpatient Procedure: 2D Echo, Cardiac Doppler and Color Doppler Indications:    CHF-Acute Diastolic E93.81  History:        Patient has prior history of  Echocardiogram examinations, most                 recent 05/22/2021. Risk Factors:Diabetes and Hypertension. Severe                 volume overload/bilateral lower extremity edema. Liver cirrhosis                 with hepatic steatosis. Chronic macrocytic anemia.  Sonographer:    Darlina Sicilian RDCS Referring Phys: [phone] Azalee Weimer [facility]  Sonographer Comments: Suboptimal apical window due to limited patient mobility. IMPRESSIONS  1. Left ventricular ejection fraction, by estimation, is 60 to 65%. The left ventricle has normal function. The left ventricle has no regional wall motion abnormalities. Left ventricular diastolic parameters are indeterminate.  2. Right ventricular systolic function is normal. The right ventricular size is normal. Tricuspid regurgitation signal is inadequate for assessing PA pressure.  3. No evidence of mitral valve regurgitation.  4. The aortic valve was not well visualized. Aortic valve regurgitation is not visualized.  5. IVC is not well visualzied. Comparison(s): No significant change from prior study. FINDINGS  Left Ventricle: Left ventricular ejection fraction, by estimation, is 60 to 65%. The left ventricle has normal function. The left ventricle has no regional wall motion abnormalities. The left ventricular internal cavity size was normal in size. There is  no left ventricular hypertrophy. Left ventricular diastolic parameters are indeterminate. Right Ventricle: The right ventricular size is normal. Right ventricular systolic function is normal. Tricuspid regurgitation signal is inadequate for assessing PA pressure. Left Atrium: Left atrial size was normal in size. Right Atrium: Right atrial size was normal in size. Pericardium: There is no evidence of pericardial effusion. Mitral Valve: No evidence of mitral valve regurgitation. Tricuspid Valve: The tricuspid valve is normal in structure. Tricuspid valve regurgitation is not demonstrated. Aortic Valve: The aortic valve was not well  visualized. Aortic valve regurgitation is not visualized. Pulmonic Valve: Pulmonic valve regurgitation is not visualized. Aorta: The aortic root and ascending aorta are structurally normal, with no evidence of dilitation. Venous: IVC is not well visualzied.  LEFT VENTRICLE PLAX 2D LVIDd:         4.60 cm   Diastology LVIDs:         3.20 cm   LV e' medial:    7.18 cm/s LV PW:         0.80 cm   LV E/e' medial:  7.6 LV IVS:        0.85 cm   LV e' lateral:   7.29 cm/s LVOT diam:     1.80 cm   LV E/e' lateral: 7.5 LV SV:         33 LV SV Index:   17 LVOT Area:     2.54 cm  LEFT ATRIUM             Index        RIGHT ATRIUM          Index LA diam:        3.00 cm 1.55 cm/m   RA Area:     6.47 cm LA Vol (A2C):   31.3 ml 16.12 ml/m  RA Volume:   8.99 ml  4.63 ml/m LA Vol (A4C):   33.6 ml 17.31 ml/m LA Biplane Vol: 33.2 ml 17.10 ml/m  AORTIC VALVE LVOT Vmax:   80.90 cm/s LVOT Vmean:  59.500 cm/s LVOT VTI:    0.130 m  AORTA Ao Root diam: 2.70 cm Ao Asc diam:  2.50 cm MITRAL VALVE MV Area (PHT): 3.46 cm    SHUNTS MV Decel Time: 219 msec    Systemic VTI:  0.13 m MV E velocity: 54.80 cm/s  Systemic Diam: 1.80 cm MV A velocity: 52.70 cm/s MV E/A ratio:  1.04 Mary Scientist, physiological signed by Phineas Inches Signature Date/Time: 12/15/2021/1:46:34 PM    Final    VAS [place] LOWER EXTREMITY VENOUS (DVT)  Result Date: 12/13/2021  Lower Venous DVT Study Patient Name:  WILLEEN NOVAK  Edison Pace  Date of Exam:   12/13/2021 Medical Rec #: [id number]     Accession #:    [id number] Date of Birth: 02-10-1981      Patient Gender: F Patient Age:   89 years Exam Location:  [facility] Procedure:      VAS [place] LOWER EXTREMITY VENOUS (DVT) Referring Phys: Ajiah Mcglinn --------------------------------------------------------------------------------  Indications: Edema.  Limitations: Body habitus, poor ultrasound/tissue interface and subcutaneous edema. Comparison Study: No previous exams Performing Technologist: Jody Hill RVT, RDMS  Examination  Guidelines: A complete evaluation includes B-mode imaging, spectral Doppler, color Doppler, and power Doppler as needed of all accessible portions of each vessel. Bilateral testing is considered an integral part of a complete examination. Limited examinations for reoccurring indications may be performed as noted. The reflux portion of the exam is performed with the patient in reverse Trendelenburg.  +--------+---------------+---------+-----------+----------+--------------------+ RIGHT   CompressibilityPhasicitySpontaneityPropertiesThrombus Aging       +--------+---------------+---------+-----------+----------+--------------------+ CFV     Full           Yes      Yes                                       +--------+---------------+---------+-----------+----------+--------------------+ SFJ     Full                                                              +--------+---------------+---------+-----------+----------+--------------------+ FV Prox Full           Yes      Yes                                       +--------+---------------+---------+-----------+----------+--------------------+ FV Mid  Full           Yes      Yes                                       +--------+---------------+---------+-----------+----------+--------------------+ FV      Full           Yes      Yes                                       Distal                                                                    +--------+---------------+---------+-----------+----------+--------------------+ PFV                    Yes      Yes                  patent by  color/doppler        +--------+---------------+---------+-----------+----------+--------------------+ POP     Full           Yes      Yes                                       +--------+---------------+---------+-----------+----------+--------------------+ PTV      Full                                         Not well visualized  +--------+---------------+---------+-----------+----------+--------------------+ PERO                                                 not visualized       +--------+---------------+---------+-----------+----------+--------------------+   Right Technical Findings: Not visualized segments include PeroV.  +--------+---------------+---------+-----------+----------+--------------------+ LEFT    CompressibilityPhasicitySpontaneityPropertiesThrombus Aging       +--------+---------------+---------+-----------+----------+--------------------+ CFV     Full           Yes      Yes                                       +--------+---------------+---------+-----------+----------+--------------------+ SFJ     Full                                                              +--------+---------------+---------+-----------+----------+--------------------+ FV Prox Full           Yes      Yes                                       +--------+---------------+---------+-----------+----------+--------------------+ FV Mid  Full           Yes      Yes                                       +--------+---------------+---------+-----------+----------+--------------------+ FV                                                   Not visualized       Distal                                                                    +--------+---------------+---------+-----------+----------+--------------------+ PFV  Yes      Yes                  patent by                                                                 color/doppler        +--------+---------------+---------+-----------+----------+--------------------+ POP     Full           Yes      Yes                                       +--------+---------------+---------+-----------+----------+--------------------+ PTV                                                   patent by color      +--------+---------------+---------+-----------+----------+--------------------+ PERO    Full                                                              +--------+---------------+---------+-----------+----------+--------------------+   Left Technical Findings: Not visualized segments include distal FV, PTV.   Summary: BILATERAL: -No evidence of popliteal cyst, bilaterally. - Diffuse subcutaneous edema, bilaterally. RIGHT: - There is no evidence of deep vein thrombosis in the lower extremity. However, portions of this examination were limited- see technologist comments above.  LEFT: - There is no evidence of deep vein thrombosis in the lower extremity. However, portions of this examination were limited- see technologist comments above.  *See table(s) above for measurements and observations. Electronically signed by Monica Martinez MD on 12/13/2021 at 2:05:22 PM.    Final    US Abdomen Limited RUQ (LIVER/GB)  Result Date: 12/12/2021 CLINICAL DATA:  41 year old female with history of elevated liver enzymes. EXAM: ULTRASOUND ABDOMEN LIMITED RIGHT UPPER QUADRANT COMPARISON:  Abdominal ultrasound 11/18/2021. FINDINGS: Gallbladder: No gallstones or wall thickening visualized. No sonographic Murphy sign noted by sonographer. Common bile duct: Diameter: 6.5 mm Liver: No focal lesion identified. Hepatic parenchyma is diffusely but heterogeneously echogenic, and liver has a nodular contour, indicative of underlying cirrhosis, likely with a background of fatty infiltration. Portal vein is patent on color Doppler imaging with normal direction of blood flow towards the liver. Other: None. IMPRESSION: 1. Borderline dilated common bile duct, similar to the prior examination. No cholelithiasis or definite choledocholithiasis. Additionally, there is no evidence of intrahepatic biliary ductal dilatation. If there is clinical concern for choledocholithiasis or other  cause of biliary tract obstruction, further evaluation with abdominal MRI with and without IV gadolinium with MRCP could be considered. 2. Morphologic changes in the liver indicative of cirrhosis, likely with a background of hepatic steatosis. Electronically Signed   By: Vinnie Langton M.D.   On: 12/12/2021 05:49   DG Abd Portable 1V  Result Date: 12/06/2021 CLINICAL DATA:  Abdominal pain. EXAM: PORTABLE ABDOMEN - 1  VIEW COMPARISON:  12/04/2021 FINDINGS: No evidence of bowel obstruction or significant ileus. No abnormal calcifications. Stable appearance of IUD. Visualized bony structures are unremarkable. IMPRESSION: No acute findings. Electronically Signed   By: Aletta Edouard M.D.   On: 12/06/2021 15:45   DG Abdomen Acute W/Chest  Result Date: 12/04/2021 CLINICAL DATA:  Nausea and vomiting with abdominal pain. EXAM: DG ABDOMEN ACUTE WITH 1 VIEW CHEST COMPARISON:  Chest x-ray dated November 18, 2021. FINDINGS: There is no evidence of dilated bowel loops or free intraperitoneal air. Questionable small bowel wall thickening in the left abdomen. No radiopaque calculi or other significant radiographic abnormality is seen. IUD in the pelvis. Heart size and mediastinal contours are within normal limits. Minimal linear atelectasis at the lung bases. No focal consolidation, pleural effusion, or pneumothorax. No acute osseous abnormality. IMPRESSION: 1. Questionable small bowel wall thickening in the left abdomen, which could reflect enteritis. No obstruction. 2. Minimal bibasilar atelectasis. No acute cardiopulmonary disease. Electronically Signed   By: Titus Dubin M.D.   On: 12/04/2021 13:49   US Abdomen Limited RUQ (LIVER/GB)  Result Date: 11/18/2021 CLINICAL DATA:  Vomiting. EXAM: ULTRASOUND ABDOMEN LIMITED RIGHT UPPER QUADRANT COMPARISON:  Abdominal ultrasound 09/24/2021 FINDINGS: Gallbladder: No gallstones or wall thickening visualized. No sonographic Murphy sign noted by sonographer. Common bile duct:  Diameter: 7 mm, unchanged. Liver: No focal lesion identified. Increased echogenicity with diffusely coarsened heterogeneous echotexture. Portal vein is patent on color Doppler imaging with normal direction of blood flow towards the liver. Other: None. IMPRESSION: 1. The common bile duct is mildly enlarged measuring 7 mm. This is unchanged from the prior examination. Recommend correlation with lab values. 2. No cholelithiasis. 3. Echogenic heterogeneous liver echotexture may be related to diffuse hepatocellular disease. This can be further evaluated with liver MRI if clinically warranted. Electronically Signed   By: Ronney Asters M.D.   On: 11/18/2021 22:19   DG Chest Portable 1 View  Result Date: 11/18/2021 CLINICAL DATA:  Shortness of breath and vomiting EXAM: PORTABLE CHEST 1 VIEW COMPARISON:  09/22/2021 FINDINGS: The heart size and mediastinal contours are within normal limits. Both lungs are clear. The visualized skeletal structures are unremarkable. IMPRESSION: No active disease. Electronically Signed   By: Inez Catalina M.D.   On: 11/18/2021 21:41      Subjective: Patient seen and examined at the bedside this morning.  Family at bedside.  She looks much better today.  Alert and oriented.  She is very eager to go home.  Discharge Exam: Vitals:   12/18/21 1203 12/18/21 1416  BP: 98/66 103/67  Pulse: (!) 105 (!) 105  Resp: 17 18  Temp: (!) 97.5 F (36.4 C) 98 F (36.7 C)  SpO2: 98% 100%   Vitals:   12/18/21 0802 12/18/21 0955 12/18/21 1203 12/18/21 1416  BP: 1'00/63 97/65 98/66 '$ 103/67  Pulse: (!) 116 (!) 114 (!) 105 (!) 105  Resp: '17 18 17 18  '$ Temp: 97.9 F (36.6 C) 98 F (36.7 C) (!) 97.5 F (36.4 C) 98 F (36.7 C)  TempSrc: Oral Oral Oral Oral  SpO2: 100% 99% 98% 100%  Weight:      Height:        General: Pt is alert, awake, not in acute distress Cardiovascular: RRR, S1/S2 +, no rubs, no gallops Respiratory: CTA bilaterally, no wheezing, no rhonchi Abdominal: Soft, NT, ND,  bowel sounds + Extremities: Bilateral  lower extremity edema, no cyanosis    The results of significant diagnostics from this hospitalization (including  imaging, microbiology, ancillary and laboratory) are listed below for reference.     Microbiology: No results found for this or any previous visit (from the past 240 hour(s)).   Labs: BNP (last 3 results) Recent Labs    05/21/21 1110 12/13/21 0947  BNP 297.4* 79.0   Basic Metabolic Panel: Recent Labs  Lab 12/12/21 0903 12/12/21 1309 12/14/21 0536 12/14/21 1505 12/15/21 0607 12/16/21 0117 12/17/21 0503 12/18/21 0553  NA 136   < > 138 139 139 134* 134* 142  K 3.1*   < > 2.4* 2.8* 3.2* 4.0 2.9* 3.0*  CL 110   < > 110 107 109 103 105 108  CO2 17*   < > 21* 22 21* 21* 23 23  GLUCOSE 179*   < > 175* 102* 76 169* 99 99  BUN 9   < > '8 9 11 12 13 13  '$ CREATININE 1.05*   < > 1.16* 1.18* 1.02* 1.13* 1.21* 1.20*  CALCIUM 8.4*   < > 8.4* 8.4* 8.3* 8.1* 8.2* 8.8*  MG 1.8  --  1.9  --   --   --   --   --    < > = values in this interval not displayed.   Liver Function Tests: Recent Labs  Lab 12/11/21 2319 12/12/21 0436 12/17/21 0503 12/18/21 0553  AST 137* 102* 53* 49*  ALT 70* 52* 37 33  ALKPHOS 278* 202* 196* 190*  BILITOT 1.8* 1.6* 1.5* 1.3*  PROT 6.8 5.7* 6.2* 6.3*  ALBUMIN 2.9* 2.7* 2.7* 2.8*   Recent Labs  Lab 12/11/21 2319  LIPASE 26   No results for input(s): "AMMONIA" in the last 168 hours. CBC: Recent Labs  Lab 12/11/21 2319 12/12/21 0903 12/14/21 0536 12/16/21 0117 12/18/21 0553  WBC 7.6 6.6 6.4 8.4 9.1  NEUTROABS 4.3  --   --   --   --   HGB 9.5* 7.4* 7.9* 8.3* 9.1*  HCT 28.4* 22.2* 24.4* 25.3* 27.6*  MCV 104.8* 106.2* 108.4* 108.1* 108.7*  PLT 136* 99* 122* 176 231   Cardiac Enzymes: No results for input(s): "CKTOTAL", "CKMB", "CKMBINDEX", "TROPONINI" in the last 168 hours. BNP: Invalid input(s): "POCBNP" CBG: Recent Labs  Lab 12/17/21 1646 12/17/21 2124 12/18/21 0757 12/18/21 1150  12/18/21 1702  GLUCAP 144* 115* 165* 115* 163*   D-Dimer No results for input(s): "DDIMER" in the last 72 hours. Hgb A1c No results for input(s): "HGBA1C" in the last 72 hours. Lipid Profile No results for input(s): "CHOL", "HDL", "LDLCALC", "TRIG", "CHOLHDL", "LDLDIRECT" in the last 72 hours. Thyroid function studies No results for input(s): "TSH", "T4TOTAL", "T3FREE", "THYROIDAB" in the last 72 hours.  Invalid input(s): "FREET3" Anemia work up No results for input(s): "VITAMINB12", "FOLATE", "FERRITIN", "TIBC", "IRON", "RETICCTPCT" in the last 72 hours. Urinalysis    Component Value Date/Time   COLORURINE STRAW (A) 12/17/2021 1317   APPEARANCEUR CLEAR 12/17/2021 1317   LABSPEC 1.004 (L) 12/17/2021 1317   PHURINE 5.0 12/17/2021 1317   GLUCOSEU NEGATIVE 12/17/2021 1317   HGBUR SMALL (A) 12/17/2021 1317   BILIRUBINUR NEGATIVE 12/17/2021 1317   KETONESUR NEGATIVE 12/17/2021 1317   PROTEINUR NEGATIVE 12/17/2021 1317   UROBILINOGEN 0.2 04/11/2011 1437   NITRITE NEGATIVE 12/17/2021 1317   LEUKOCYTESUR TRACE (A) 12/17/2021 1317   Sepsis Labs Recent Labs  Lab 12/12/21 0903 12/14/21 0536 12/16/21 0117 12/18/21 0553  WBC 6.6 6.4 8.4 9.1   Microbiology No results found for this or any previous visit (from the past 240 hour(s)).  Please  note: You were cared for by a hospitalist during your hospital stay. Once you are discharged, your primary care physician will handle any further medical issues. Please note that NO REFILLS for any discharge medications will be authorized once you are discharged, as it is imperative that you return to your primary care physician (or establish a relationship with a primary care physician if you do not have one) for your post hospital discharge needs so that they can reassess your need for medications and monitor your lab values.    Time coordinating discharge: 40 minutes  SIGNED:   Shelly Coss, MD  [facility] 12/18/2021, 6:14  PM Pager [phone]  If 7PM-7AM, please contact night-coverage [email or web address] Password [email or web address]

## 2021-12-18 NOTE — Progress Notes (Signed)
Patient discharged to home. AVS reviewed with the patient and her sister in law. Prescriptions brought to the bedside by [facility]. IV removed. Tele-box returned.  Unable to locate a pair of the patient's shoes. Checked emergency room, ICU and patient's room. Charge RN made aware Rash noted on the back of both hands. Patient reported they became itchy and red earlier today. MD notified and PRN medications ordered and sent to patient's pharmacy.

## 2021-12-18 NOTE — Progress Notes (Signed)
Occupational Therapy Treatment Patient Details Name: Anna Garcia MRN: [id number] DOB: December 18, 1980 Today's Date: 12/18/2021   History of present illness Pt is a 41 yo female admitted just after recent d/c with difficulty walking and fatigue.  Pt on DKA protocol.  PT iwth DM, fibromyalgia, chronic pain syndrome, HTN, chronic urinary retention with self caths. Pt has attempted to leave AMA.  Psych to see pt hopefully 8/29.   OT comments  Pt making slow, steady progress toward all adl goals. Pt in pain with most mobility and has great difficulty reaching lower legs. Will introduce adaptive equipment at next session if LE edema persists and pt is agreeable. Pt self limiting some activity in room but did participate in adls in bathroom.  Encouraged pt to sit in chair after the session despite wanting to return to bed.    Recommendations for follow up therapy are one component of a multi-disciplinary discharge planning process, led by the attending physician.  Recommendations may be updated based on patient status, additional functional criteria and insurance authorization.    Follow Up Recommendations  Home health OT    Assistance Recommended at Discharge Intermittent Supervision/Assistance  Patient can return home with the following  A little help with walking and/or transfers;A little help with bathing/dressing/bathroom;Assistance with feeding;Assistance with cooking/housework;Assist for transportation   Equipment Recommendations  None recommended by OT    Recommendations for Other Services      Precautions / Restrictions Precautions Precautions: Fall Precaution Comments: Pt continues to feel "foggy=" Restrictions Weight Bearing Restrictions: No Other Position/Activity Restrictions: signficiant LE edema       Mobility Bed Mobility Overal bed mobility: Needs Assistance Bed Mobility: Supine to Sit     Supine to sit: Supervision, HOB elevated     General bed mobility comments: No  physical assist needed although grimacing with pain    Transfers Overall transfer level: Needs assistance Equipment used: Rolling walker (2 wheels) Transfers: Sit to/from Stand Sit to Stand: Supervision           General transfer comment: Supv for safety     Balance Overall balance assessment: History of Falls, Needs assistance Sitting-balance support: Feet supported Sitting balance-Leahy Scale: Good     Standing balance support: During functional activity, Bilateral upper extremity supported Standing balance-Leahy Scale: Fair Standing balance comment: uses walker heavily at times but other times walks without it. Pt did take fall in room last night when not using walker.                           ADL either performed or assessed with clinical judgement   ADL Overall ADL's : Needs assistance/impaired Eating/Feeding: Independent;Sitting   Grooming: Wash/dry hands;Wash/dry face;Oral care;Supervision/safety;Standing Grooming Details (indicate cue type and reason): pt stood at sink to groom with supervision                 Toilet Transfer: Supervision/safety;Ambulation;Rolling walker (2 wheels);Comfort height toilet;Grab bars Toilet Transfer Details (indicate cue type and reason): Pt walked to bathroom to toilet with walker. Pt states she does not need walker but did take a fall in room without it last night and relied heavily on it during mobility this am. Toileting- Clothing Manipulation and Hygiene: Supervision/safety;Sit to/from stand;Cueing for compensatory techniques       Functional mobility during ADLs: Supervision/safety;Rolling walker (2 wheels) General ADL Comments: Pt continues to require assist for LE adls and will need assist for tasks that require to be  on her feet such as cooking. Do not feel home is a safe d/c plan unless someone can be with her.    Extremity/Trunk Assessment Upper Extremity Assessment Upper Extremity Assessment: Generalized  weakness   Lower Extremity Assessment Lower Extremity Assessment: Defer to PT evaluation        Vision   Vision Assessment?: No apparent visual deficits   Perception Perception Perception: Within Functional Limits   Praxis Praxis Praxis: Intact    Cognition Arousal/Alertness: Awake/alert Behavior During Therapy: Flat affect Overall Cognitive Status: Impaired/Different from baseline Area of Impairment: Orientation, Awareness, Problem solving                 Orientation Level: Time         Awareness: Emergent Problem Solving: Slow processing, Decreased initiation, Requires verbal cues General Comments: Pt generally intact but does move slowly and tend to process slowly today        Exercises      Shoulder Instructions       General Comments Pt continues to be very flat with behavior and slow to respond and move, but overall required supervision for grooming and toileting tasks today.    Pertinent Vitals/ Pain       Pain Assessment Pain Assessment: 0-10 Pain Score: 8  Pain Location: B legs Pain Descriptors / Indicators: Constant, Throbbing, Tightness, Discomfort Pain Intervention(s): Limited activity within patient's tolerance, Monitored during session, Patient requesting pain meds-RN notified, Repositioned  Home Living                                          Prior Functioning/Environment              Frequency  Min 2X/week        Progress Toward Goals  OT Goals(current goals can now be found in the care plan section)  Progress towards OT goals: Progressing toward goals  Acute Rehab OT Goals Patient Stated Goal: to not be as swollen OT Goal Formulation: With patient Time For Goal Achievement: 12/28/21 Potential to Achieve Goals: Good ADL Goals Pt Will Perform Lower Body Bathing: with modified independence;sit to/from stand Pt Will Perform Lower Body Dressing: with modified independence;sit to/from stand;with  adaptive equipment Pt Will Perform Tub/Shower Transfer: Tub transfer;ambulating;grab bars;rolling walker Additional ADL Goal #1: Pt will walk to bathroom without walker and complete all toileting tasks with no assist.  Plan Discharge plan remains appropriate    Co-evaluation                 AM-PAC OT "6 Clicks" Daily Activity     Outcome Measure   Help from another person eating meals?: None Help from another person taking care of personal grooming?: None Help from another person toileting, which includes using toliet, bedpan, or urinal?: None Help from another person bathing (including washing, rinsing, drying)?: A Little Help from another person to put on and taking off regular upper body clothing?: A Little Help from another person to put on and taking off regular lower body clothing?: A Lot 6 Click Score: 20    End of Session    OT Visit Diagnosis: Unsteadiness on feet (R26.81);History of falling (Z91.81);Pain Pain - Right/Left:  (both) Pain - part of body: Leg   Activity Tolerance Patient limited by pain   Patient Left in chair;with call bell/phone within reach;with nursing/sitter in room;with chair alarm set  Nurse Communication Mobility status        Time: 5183-4373 OT Time Calculation (min): 31 min  Charges: OT General Charges $OT Visit: 1 Visit OT Treatments $Self Care/Home Management : 23-37 mins   Glenford Peers 12/18/2021, 11:35 AM

## 2021-12-18 NOTE — Progress Notes (Signed)
Pt removed ted hose d/t complaints of pain.

## 2021-12-18 NOTE — Progress Notes (Signed)
Pt attempting to leave AMA. Pt has been hallucinating but able to answers orientation questions correctly, yet confused. She has packed items in the room such as hospital phone and hospital dishes. Security called as pt attempted to get on the elevator. Security was able to convince pt to go to her room to wait for MD to come evaluate. MD and [facility] in route with IVC paperwork.

## 2021-12-18 NOTE — Consult Note (Signed)
[facility] Consult   Reason for Consult:  Severe anxiety/depression Referring Physician:  Dr. 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 female patient admitted with DKA.  Patient reports she has been self managing her diabetes for a number of years, and has been able to appropriately identify when her sugars are elevated and when she needs to seek care in the emergency room.  She reports on this most recent occasion, she describes general malaise and shortness of breath.  In terms of her previous psychiatric history, patient denies having any diagnosis of manic depression as an adult. "  My symptoms were present more prominent when I was a child, as an adult I have not dealt with any mania or depression.  I do have days when I am down, however it is nothing abnormal for people to have bad days."  She denies any current outpatient psychiatric services.  She denies any history of taking any psychotropic medications as an adult.  She further denies any inpatient psychiatric hospitalizations recent and or past.  She does endorse history of daily use of vodka "a few shots a day".  She reports complete alcohol cessation 1 week prior to first admission in August.  She denies any withdrawal symptoms, urges, cravings at this time.  She further denies any substance use/alcohol use programs.  She denies any history of suicide attempt, suicidal ideation, and or nonsuicidal self-injurious behavior.  She further reports no known  history of confusion, altered mental status, disorientation " even with high blood sugars, I have never experienced that before.  I do think it was just too many medications on board. "  She denies any history of multiple polypharmacy, accidental overdose, and or misuse or diversion of medication.  Urine drug screen is negative.  During evaluation Anna Garcia is seated at the edge of chair.  Prior to this patient was observed in the shower, requiring no assistance to complete her ADLs.  She does thank this provider for waiting while she finished bathing.  Writer reintroduced self to patient, in which she immediately apologizes for her over sedation yesterday.   She is alert/oriented x 4; calm/cooperative; and mood congruent with affect.  Patient is speaking in a clear tone at moderate volume, and normal pace; with good eye contact.  Her thought process is coherent and relevant; There is no indication that she is currently responding to internal/external stimuli or experiencing delusional thought content.  Patient denies suicidal/self-harm/homicidal ideation, psychosis, and paranoia.  Patient has remained calm throughout assessment and has answered questions appropriately.  Her current findings are incongruent with information obtained in IVC, she currently no longer meets criteria for [facility] involuntary commitment.  She has greatly improved since her initial attempt at psychiatric evaluation.  Patient does answer all questions appropriately, continues to display linear thought processes throughout.  Patient with improvement in her current psychiatric symptoms as evidenced by patient's ability to remain awake and fully engage in psychiatric evaluation and treatment, ability to complete ADLs, improvement in insight  and judgment.  Patient is able to demonstrate capacity, and remains oriented throughout this evaluation.  Safety sitter is notably at bedside.  No new concerns have been identified. While future  psychiatric events cannot be accurately predicted, the patient does not currently require further acute inpatient psychiatric care and does not currently meet [facility] involuntary commitment criteria.   HPI:   Patient is a 41 year old female with history of uncontrolled type 1 diabetes, diabetic gastroparesis, chronic pain syndrome, fibromyalgia, hypertension, chronic urinary retention with self cath at home who presents here with complaint of fatigue, difficulty walking.  She was just discharged from here on 8/22 after being managed for DKA, nausea, abdominal pain.   Hospital course remarkable for persistent sinus tachycardia, severe volume overload, lower extremity edema.Started on iv Lasix .  Patient became increasingly confused, also had a fall in the room.  Psychiatry consulted today for severe anxiety/depression with psychotic symptoms.  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  Pt denies ever been hospitalized for mental health concerns in the past. Denies any previous history of suicidal thoughts, suicidal ideations, and or non suicidal self injurious behaviors. Pt denies history of aggression, agitation, violent behavior, and or history of homicidal ideations/thoughts.  Patient further denies any current, previous legal charges.  Patient further denies access to guns, weapons, or any engagement with the legal system.  Patient denies history of illicit substances to include synthetic substances, any cannabidiol, supplemental herbs.  Patient denies history of inpatient psychiatric hospitalization.  Risk to Self:  Denies Risk to Others:  Denies Prior Inpatient Therapy:  Denies Prior Outpatient Therapy:  Denies  Past Medical History:  Past Medical History:  Diagnosis Date   Diabetes mellitus    Gastroparesis    Hypertension    Neuropathy     Past Surgical History:  Procedure Laterality Date   ESOPHAGOGASTRODUODENOSCOPY (EGD) WITH PROPOFOL  N/A 09/24/2021   Procedure: ESOPHAGOGASTRODUODENOSCOPY (EGD) WITH PROPOFOL;  Surgeon: Clarene Essex, MD;  Location: [facility];  Service: Gastroenterology;  Laterality: N/A;   SAVORY DILATION N/A 09/24/2021   Procedure: SAVORY DILATION - 16 MM;  Surgeon: Clarene Essex, MD;  Location: [facility];  Service: Gastroenterology;  Laterality: N/A;   Family History:  Family History  Problem Relation Age of Onset   Hypertension Mother    Dementia Mother    Heart attack Father    Hypertension Father    Heart disease Father    Hypertension Brother    Stroke Maternal Grandfather    Family Psychiatric  History: mother has history of dementia Social History:  Social History   Substance and Sexual Activity  Alcohol Use No     Social History   Substance and Sexual Activity  Drug Use No    Social History   Socioeconomic History   Marital status: Single    Spouse name: Not on file   Number of children: Not on file   Years of education: Not on file   Highest education level: Not on file  Occupational History   Not on file  Tobacco Use   Smoking status: Former    Packs/day: 0.50    Types: Cigarettes   Smokeless tobacco: Never  Vaping Use   Vaping Use: Never used  Substance and Sexual Activity   Alcohol use: No   Drug use: No   Sexual activity: Not on file  Other Topics Concern   Not on file  Social History Narrative   Not on file  Social Determinants of Health   Financial Resource Strain: Not on file  Food Insecurity: Not on file  Transportation Needs: Not on file  Physical Activity: Not on file  Stress: Not on file  Social Connections: Not on file   Additional Social History:    Allergies:  No Known Allergies  Labs:  Results for orders placed or performed during the hospital encounter of 12/11/21 (from the past 48 hour(s))  Glucose, capillary     Status: Abnormal   Collection Time: 12/16/21  4:51 PM  Result Value Ref Range   Glucose-Capillary 162 (H) 70 - 99 mg/dL     Comment: Glucose reference range applies only to samples taken after fasting for at least 8 hours.  Glucose, capillary     Status: Abnormal   Collection Time: 12/16/21  8:36 PM  Result Value Ref Range   Glucose-Capillary 60 (L) 70 - 99 mg/dL    Comment: Glucose reference range applies only to samples taken after fasting for at least 8 hours.   Comment 1 Notify RN    Comment 2 Document in Chart   Glucose, capillary     Status: None   Collection Time: 12/16/21 10:17 PM  Result Value Ref Range   Glucose-Capillary 98 70 - 99 mg/dL    Comment: Glucose reference range applies only to samples taken after fasting for at least 8 hours.  Anti-smooth muscle antibody, IgG     Status: None   Collection Time: 12/17/21  5:03 AM  Result Value Ref Range   F-Actin IgG 3 0 - 19 Units    Comment: (NOTE)                 Negative                     0 - 19                 Weak positive               20 - 30                 Moderate to strong positive     >30 Actin Antibodies are found in 52-85% of patients with autoimmune hepatitis or chronic active hepatitis and in 22% of patients with primary biliary cirrhosis. Performed At: [facility] [place], [place] [id number] Rush Farmer MD [id number]   Ceruloplasmin     Status: Abnormal   Collection Time: 12/17/21  5:03 AM  Result Value Ref Range   Ceruloplasmin 9.0 (L) 19.0 - 39.0 mg/dL    Comment: (NOTE) Performed At: [facility] [place], [place] [id number] Rush Farmer MD [id number]   Mitochondrial antibodies     Status: None   Collection Time: 12/17/21  5:03 AM  Result Value Ref Range   Mitochondrial M2 Ab, IgG <20.0 0.0 - 20.0 Units    Comment: (NOTE)                                Negative    0.0 - 20.0                                Equivocal  20.1 - 24.9  Positive         >24.9 Mitochondrial (M2) Antibodies are found in 90-96% of patients with primary  biliary cirrhosis. Performed At: [facility] [place], [place] [id number] Rush Farmer MD [id number]   Rheumatoid factor     Status: None   Collection Time: 12/17/21  5:03 AM  Result Value Ref Range   Rhuematoid fact SerPl-aCnc <10.0 <14.0 IU/mL    Comment: (NOTE) Performed At: [facility] [place], [place] [id number] Rush Farmer MD [id number]   ANA     Status: None   Collection Time: 12/17/21  5:03 AM  Result Value Ref Range   Anti Nuclear Antibody (ANA) Negative Negative    Comment: (NOTE) Performed At: [facility] [place], [place] [id number] Rush Farmer MD [id number]   Comprehensive metabolic panel     Status: Abnormal   Collection Time: 12/17/21  5:03 AM  Result Value Ref Range   Sodium 134 (L) 135 - 145 mmol/L   Potassium 2.9 (L) 3.5 - 5.1 mmol/L    Comment: DELTA CHECK NOTED   Chloride 105 98 - 111 mmol/L   CO2 23 22 - 32 mmol/L   Glucose, Bld 99 70 - 99 mg/dL    Comment: Glucose reference range applies only to samples taken after fasting for at least 8 hours.   BUN 13 6 - 20 mg/dL   Creatinine, Ser 1.21 (H) 0.44 - 1.00 mg/dL   Calcium 8.2 (L) 8.9 - 10.3 mg/dL   Total Protein 6.2 (L) 6.5 - 8.1 g/dL   Albumin 2.7 (L) 3.5 - 5.0 g/dL   AST 53 (H) 15 - 41 U/L   ALT 37 0 - 44 U/L   Alkaline Phosphatase 196 (H) 38 - 126 U/L   Total Bilirubin 1.5 (H) 0.3 - 1.2 mg/dL   GFR, Estimated 58 (L) >60 mL/min    Comment: (NOTE) Calculated using the CKD-EPI Creatinine Equation (2021)    Anion gap 6 5 - 15    Comment: Performed at [facility], [place] [address], [place], [place] [address]  Glucose, capillary     Status: Abnormal   Collection Time: 12/17/21  8:16 AM  Result Value Ref Range   Glucose-Capillary 69 (L) 70 - 99 mg/dL    Comment: Glucose reference range applies only to samples taken after fasting for at least 8 hours.   Comment 1 Notify RN    Comment 2 Document  in Chart   Glucose, capillary     Status: Abnormal   Collection Time: 12/17/21 12:33 PM  Result Value Ref Range   Glucose-Capillary 184 (H) 70 - 99 mg/dL    Comment: Glucose reference range applies only to samples taken after fasting for at least 8 hours.   Comment 1 Notify RN    Comment 2 Document in Chart   Urinalysis, Routine w reflex microscopic     Status: Abnormal   Collection Time: 12/17/21  1:17 PM  Result Value Ref Range   Color, Urine STRAW (A) YELLOW   APPearance CLEAR CLEAR   Specific Gravity, Urine 1.004 (L) 1.005 - 1.030   pH 5.0 5.0 - 8.0   Glucose, UA NEGATIVE NEGATIVE mg/dL   Hgb urine dipstick SMALL (A) NEGATIVE   Bilirubin Urine NEGATIVE NEGATIVE   Ketones, ur NEGATIVE NEGATIVE mg/dL   Protein, ur NEGATIVE NEGATIVE mg/dL   Nitrite NEGATIVE NEGATIVE   Leukocytes,Ua TRACE (A) NEGATIVE   WBC, UA 21-50 0 - 5  WBC/hpf   Bacteria, UA RARE (A) NONE SEEN   Squamous Epithelial / LPF 0-5 0 - 5    Comment: Performed at [facility], [place] [address] [place], [place] [address]  Glucose, capillary     Status: Abnormal   Collection Time: 12/17/21  4:46 PM  Result Value Ref Range   Glucose-Capillary 144 (H) 70 - 99 mg/dL    Comment: Glucose reference range applies only to samples taken after fasting for at least 8 hours.   Comment 1 Notify RN    Comment 2 Document in Chart   Glucose, capillary     Status: Abnormal   Collection Time: 12/17/21  9:24 PM  Result Value Ref Range   Glucose-Capillary 115 (H) 70 - 99 mg/dL    Comment: Glucose reference range applies only to samples taken after fasting for at least 8 hours.   Comment 1 Notify RN    Comment 2 Document in Chart   CBC     Status: Abnormal   Collection Time: 12/18/21  5:53 AM  Result Value Ref Range   WBC 9.1 4.0 - 10.5 K/uL   RBC 2.54 (L) 3.87 - 5.11 MIL/uL   Hemoglobin 9.1 (L) 12.0 - 15.0 g/dL   HCT 27.6 (L) 36.0 - 46.0 %   MCV 108.7 (H) 80.0 - 100.0 fL   MCH 35.8 (H) 26.0 - 34.0 pg   MCHC  33.0 30.0 - 36.0 g/dL   RDW 19.5 (H) 11.5 - 15.5 %   Platelets 231 150 - 400 K/uL   nRBC 0.0 0.0 - 0.2 %    Comment: Performed at [facility], [place] [address] [place], [place] [address]  Comprehensive metabolic panel     Status: Abnormal   Collection Time: 12/18/21  5:53 AM  Result Value Ref Range   Sodium 142 135 - 145 mmol/L    Comment: DELTA CHECK NOTED   Potassium 3.0 (L) 3.5 - 5.1 mmol/L   Chloride 108 98 - 111 mmol/L   CO2 23 22 - 32 mmol/L   Glucose, Bld 99 70 - 99 mg/dL    Comment: Glucose reference range applies only to samples taken after fasting for at least 8 hours.   BUN 13 6 - 20 mg/dL   Creatinine, Ser 1.20 (H) 0.44 - 1.00 mg/dL   Calcium 8.8 (L) 8.9 - 10.3 mg/dL   Total Protein 6.3 (L) 6.5 - 8.1 g/dL   Albumin 2.8 (L) 3.5 - 5.0 g/dL   AST 49 (H) 15 - 41 U/L   ALT 33 0 - 44 U/L   Alkaline Phosphatase 190 (H) 38 - 126 U/L   Total Bilirubin 1.3 (H) 0.3 - 1.2 mg/dL   GFR, Estimated 59 (L) >60 mL/min    Comment: (NOTE) Calculated using the CKD-EPI Creatinine Equation (2021)    Anion gap 11 5 - 15    Comment: Performed at [facility], [place] [address], [place],  [address]  Glucose, capillary     Status: Abnormal   Collection Time: 12/18/21  7:57 AM  Result Value Ref Range   Glucose-Capillary 165 (H) 70 - 99 mg/dL    Comment: Glucose reference range applies only to samples taken after fasting for at least 8 hours.  Glucose, capillary     Status: Abnormal   Collection Time: 12/18/21 11:50 AM  Result Value Ref Range   Glucose-Capillary 115 (H) 70 - 99 mg/dL    Comment: Glucose reference range applies only  to samples taken after fasting for at least 8 hours.    Current Facility-Administered Medications  Medication Dose Route Frequency Provider Last Rate Last Admin   dextrose 50 % solution 0-50 mL  0-50 mL Intravenous PRN Shela Leff, MD   25 mL at 12/15/21 1723   dicyclomine (BENTYL) capsule 10 mg  10 mg Oral TID  AC & HS Shela Leff, MD   10 mg at 12/18/21 1129   famotidine (PEPCID) tablet 40 mg  40 mg Oral BID Shela Leff, MD   40 mg at 12/18/21 1128   ferrous sulfate tablet 325 mg  325 mg Oral Q breakfast Shelly Coss, MD   325 mg at 12/18/21 0858   gabapentin (NEURONTIN) capsule 100 mg  100 mg Oral TID Shelly Coss, MD   100 mg at 12/18/21 1129   insulin aspart (novoLOG) injection 0-9 Units  0-9 Units Subcutaneous TID WC Shelly Coss, MD   2 Units at 12/18/21 0859   insulin aspart (novoLOG) injection 3 Units  3 Units Subcutaneous TID WC Shelly Coss, MD   3 Units at 12/16/21 1730   insulin glargine-yfgn (SEMGLEE) injection 8 Units  8 Units Subcutaneous BID Shelly Coss, MD   8 Units at 12/18/21 1129   lipase/protease/amylase (CREON) capsule 36,000 Units  36,000 Units Oral TID WC Shela Leff, MD   36,000 Units at 12/18/21 1128   metoCLOPramide (REGLAN) tablet 10 mg  10 mg Oral Q6H PRN Shela Leff, MD   10 mg at 12/12/21 2258   midodrine (PROAMATINE) tablet 5 mg  5 mg Oral TID WC Shelly Coss, MD   5 mg at 12/18/21 1129   mometasone-formoterol (DULERA) 100-5 MCG/ACT inhaler 2 puff  2 puff Inhalation BID Shela Leff, MD   2 puff at 12/18/21 0759   montelukast (SINGULAIR) tablet 10 mg  10 mg Oral QHS Shela Leff, MD   10 mg at 12/17/21 2118   Oral care mouth rinse  15 mL Mouth Rinse PRN Shelly Coss, MD       pantoprazole (PROTONIX) EC tablet 40 mg  40 mg Oral Daily Shela Leff, MD   40 mg at 12/18/21 1129   potassium chloride SA (KLOR-CON M) CR tablet 40 mEq  40 mEq Oral BID Shelly Coss, MD   40 mEq at 12/18/21 1129   rosuvastatin (CRESTOR) tablet 20 mg  20 mg Oral QHS Shela Leff, MD   20 mg at 12/17/21 2117    Musculoskeletal: Strength & Muscle Tone: within normal limits Gait & Station: normal Patient leans: N/A            Psychiatric Specialty Exam:  Presentation  General Appearance: Appropriate for  Environment; Casual  Eye Contact:Fair  Speech:Clear and Coherent; Slow  Speech Volume:Normal  Handedness:Right   Mood and Affect  Mood:Euthymic  Affect:Congruent; Appropriate   Thought Process  Thought Processes:Coherent; Linear  Descriptions of Associations:Intact  Orientation:Full (Time, Place and Person)  Thought Content:Logical  History of Schizophrenia/Schizoaffective disorder:No data recorded Duration of Psychotic Symptoms:No data recorded Hallucinations:Hallucinations: None  Ideas of Reference:None  Suicidal Thoughts:Suicidal Thoughts: No  Homicidal Thoughts:Homicidal Thoughts: No   Sensorium  Memory:Immediate Fair; Recent Fair; Remote Fair  Judgment:Fair  Insight:Fair   Executive Functions  Concentration:Fair  Attention Span:Fair  [place]   Psychomotor Activity  Psychomotor Activity:Psychomotor Activity: Normal   Assets  Assets:Communication Skills; Physical Health; Desire for Improvement; Resilience; Social Support; Catering manager; Housing   Sleep  Sleep:Sleep: Fair   Physical Exam:  Physical Exam Vitals and nursing note reviewed.  Constitutional:      Appearance: Normal appearance. She is normal weight.  HENT:     Head: Normocephalic.  Neurological:     General: No focal deficit present.     Mental Status: She is alert and oriented to person, place, and time. Mental status is at baseline.  Psychiatric:        Mood and Affect: Mood normal.        Behavior: Behavior normal.        Thought Content: Thought content normal.        Judgment: Judgment normal.    Review of Systems  Psychiatric/Behavioral: Negative.    All other systems reviewed and are negative.  Blood pressure 103/67, pulse (!) 105, temperature 98 F (36.7 C), temperature source Oral, resp. rate 18, height '5\' 4"'$  (1.626 m), weight 89.1 kg, SpO2 100 %. Body mass index is 33.72 kg/m.  Treatment Plan  Summary: Plan   Patient denies any outpatient psychiatric resources.  She further denies any use for psychotropic medication, citing her main concern was her diabetes and pain. -Recommend rescinding IVC and discontinuing Air cabin crew.  Notice of commitment change was signed by Dr. Prudence Davidson, demographics has been completed and filled out by this psychiatric nurse practitioner and placed in patient's [facility] IVC folder insight paper chart.  -Psychiatry to sign off at this time.  The above findings has been discussed and communicated with primary team and nursing staff.  Disposition: No evidence of imminent risk to self or others at present.   Patient does not meet criteria for psychiatric inpatient admission. Supportive therapy provided about ongoing stressors. Discussed crisis plan, support from social network, calling 911, coming to the Emergency Department, and calling Suicide Hotline.  Suella Broad, FNP 12/18/2021 3:12 PM

## 2021-12-18 NOTE — Progress Notes (Signed)
Mobility Specialist Cancellation/Refusal Note:    12/18/21 1007  Mobility  Activity Refused mobility     Reason for Cancellation/Refusal: Pt declined mobility at this time. Pt not in the mood. Will check back as schedule permits.     [facility]

## 2021-12-20 ENCOUNTER — Emergency Department (HOSPITAL_COMMUNITY): Payer: Medicare Other

## 2021-12-20 ENCOUNTER — Inpatient Hospital Stay (HOSPITAL_COMMUNITY)
Admission: EM | Admit: 2021-12-20 | Discharge: 2022-01-04 | DRG: 432 | Disposition: A | Discharge status: Home Health | Payer: Medicare Other | Attending: Internal Medicine | Admitting: Internal Medicine

## 2021-12-20 ENCOUNTER — Other Ambulatory Visit: Payer: Self-pay

## 2021-12-20 ENCOUNTER — Encounter (HOSPITAL_COMMUNITY): Payer: Self-pay

## 2021-12-20 DIAGNOSIS — Z87891 Personal history of nicotine dependence: Secondary | ICD-10-CM

## 2021-12-20 DIAGNOSIS — R531 Weakness: Secondary | ICD-10-CM

## 2021-12-20 DIAGNOSIS — Z23 Encounter for immunization: Secondary | ICD-10-CM

## 2021-12-20 DIAGNOSIS — D539 Nutritional anemia, unspecified: Secondary | ICD-10-CM | POA: Diagnosis present

## 2021-12-20 DIAGNOSIS — E876 Hypokalemia: Secondary | ICD-10-CM | POA: Diagnosis present

## 2021-12-20 DIAGNOSIS — R509 Fever, unspecified: Secondary | ICD-10-CM | POA: Diagnosis not present

## 2021-12-20 DIAGNOSIS — R339 Retention of urine, unspecified: Secondary | ICD-10-CM | POA: Diagnosis present

## 2021-12-20 DIAGNOSIS — I10 Essential (primary) hypertension: Secondary | ICD-10-CM | POA: Diagnosis present

## 2021-12-20 DIAGNOSIS — Z6828 Body mass index (BMI) 28.0-28.9, adult: Secondary | ICD-10-CM

## 2021-12-20 DIAGNOSIS — Z789 Other specified health status: Secondary | ICD-10-CM | POA: Diagnosis present

## 2021-12-20 DIAGNOSIS — J4540 Moderate persistent asthma, uncomplicated: Secondary | ICD-10-CM | POA: Diagnosis present

## 2021-12-20 DIAGNOSIS — E1043 Type 1 diabetes mellitus with diabetic autonomic (poly)neuropathy: Secondary | ICD-10-CM | POA: Diagnosis present

## 2021-12-20 DIAGNOSIS — A419 Sepsis, unspecified organism: Principal | ICD-10-CM

## 2021-12-20 DIAGNOSIS — E8770 Fluid overload, unspecified: Secondary | ICD-10-CM | POA: Diagnosis present

## 2021-12-20 DIAGNOSIS — B954 Other streptococcus as the cause of diseases classified elsewhere: Secondary | ICD-10-CM | POA: Diagnosis present

## 2021-12-20 DIAGNOSIS — J9601 Acute respiratory failure with hypoxia: Secondary | ICD-10-CM | POA: Diagnosis not present

## 2021-12-20 DIAGNOSIS — G9340 Encephalopathy, unspecified: Secondary | ICD-10-CM

## 2021-12-20 DIAGNOSIS — F32A Depression, unspecified: Secondary | ICD-10-CM | POA: Diagnosis present

## 2021-12-20 DIAGNOSIS — K219 Gastro-esophageal reflux disease without esophagitis: Secondary | ICD-10-CM | POA: Diagnosis present

## 2021-12-20 DIAGNOSIS — F419 Anxiety disorder, unspecified: Secondary | ICD-10-CM | POA: Diagnosis present

## 2021-12-20 DIAGNOSIS — G894 Chronic pain syndrome: Secondary | ICD-10-CM | POA: Diagnosis present

## 2021-12-20 DIAGNOSIS — E785 Hyperlipidemia, unspecified: Secondary | ICD-10-CM | POA: Diagnosis present

## 2021-12-20 DIAGNOSIS — R000 Tachycardia, unspecified: Secondary | ICD-10-CM | POA: Diagnosis present

## 2021-12-20 DIAGNOSIS — Z8249 Family history of ischemic heart disease and other diseases of the circulatory system: Secondary | ICD-10-CM

## 2021-12-20 DIAGNOSIS — E1042 Type 1 diabetes mellitus with diabetic polyneuropathy: Secondary | ICD-10-CM | POA: Diagnosis present

## 2021-12-20 DIAGNOSIS — N179 Acute kidney failure, unspecified: Secondary | ICD-10-CM | POA: Diagnosis not present

## 2021-12-20 DIAGNOSIS — E1065 Type 1 diabetes mellitus with hyperglycemia: Secondary | ICD-10-CM | POA: Diagnosis present

## 2021-12-20 DIAGNOSIS — Z79899 Other long term (current) drug therapy: Secondary | ICD-10-CM

## 2021-12-20 DIAGNOSIS — K3184 Gastroparesis: Secondary | ICD-10-CM | POA: Diagnosis present

## 2021-12-20 DIAGNOSIS — K7460 Unspecified cirrhosis of liver: Secondary | ICD-10-CM | POA: Diagnosis not present

## 2021-12-20 DIAGNOSIS — R188 Other ascites: Secondary | ICD-10-CM | POA: Diagnosis present

## 2021-12-20 DIAGNOSIS — F1090 Alcohol use, unspecified, uncomplicated: Secondary | ICD-10-CM | POA: Diagnosis present

## 2021-12-20 DIAGNOSIS — Z823 Family history of stroke: Secondary | ICD-10-CM

## 2021-12-20 DIAGNOSIS — R601 Generalized edema: Secondary | ICD-10-CM | POA: Diagnosis present

## 2021-12-20 DIAGNOSIS — R262 Difficulty in walking, not elsewhere classified: Secondary | ICD-10-CM | POA: Diagnosis present

## 2021-12-20 DIAGNOSIS — E109 Type 1 diabetes mellitus without complications: Secondary | ICD-10-CM | POA: Diagnosis present

## 2021-12-20 DIAGNOSIS — L03115 Cellulitis of right lower limb: Secondary | ICD-10-CM | POA: Diagnosis present

## 2021-12-20 DIAGNOSIS — R443 Hallucinations, unspecified: Secondary | ICD-10-CM | POA: Diagnosis not present

## 2021-12-20 DIAGNOSIS — G9341 Metabolic encephalopathy: Secondary | ICD-10-CM | POA: Diagnosis not present

## 2021-12-20 DIAGNOSIS — E669 Obesity, unspecified: Secondary | ICD-10-CM | POA: Diagnosis present

## 2021-12-20 DIAGNOSIS — K652 Spontaneous bacterial peritonitis: Secondary | ICD-10-CM

## 2021-12-20 DIAGNOSIS — E10649 Type 1 diabetes mellitus with hypoglycemia without coma: Secondary | ICD-10-CM | POA: Diagnosis not present

## 2021-12-20 DIAGNOSIS — K7581 Nonalcoholic steatohepatitis (NASH): Secondary | ICD-10-CM | POA: Diagnosis present

## 2021-12-20 DIAGNOSIS — Z781 Physical restraint status: Secondary | ICD-10-CM

## 2021-12-20 DIAGNOSIS — M797 Fibromyalgia: Secondary | ICD-10-CM | POA: Diagnosis present

## 2021-12-20 DIAGNOSIS — K766 Portal hypertension: Secondary | ICD-10-CM | POA: Diagnosis present

## 2021-12-20 DIAGNOSIS — I959 Hypotension, unspecified: Secondary | ICD-10-CM | POA: Diagnosis present

## 2021-12-20 DIAGNOSIS — Z794 Long term (current) use of insulin: Secondary | ICD-10-CM

## 2021-12-20 DIAGNOSIS — J690 Pneumonitis due to inhalation of food and vomit: Secondary | ICD-10-CM | POA: Diagnosis not present

## 2021-12-20 LAB — CBG MONITORING, ED: Glucose-Capillary: 233 mg/dL — ABNORMAL HIGH (ref 70–99)

## 2021-12-20 LAB — COMPREHENSIVE METABOLIC PANEL
ALT: 30 U/L (ref 0–44)
AST: 39 U/L (ref 15–41)
Albumin: 2.3 g/dL — ABNORMAL LOW (ref 3.5–5.0)
Alkaline Phosphatase: 191 U/L — ABNORMAL HIGH (ref 38–126)
Anion gap: 14 (ref 5–15)
BUN: 8 mg/dL (ref 6–20)
CO2: 19 mmol/L — ABNORMAL LOW (ref 22–32)
Calcium: 8.6 mg/dL — ABNORMAL LOW (ref 8.9–10.3)
Chloride: 100 mmol/L (ref 98–111)
Creatinine, Ser: 1.23 mg/dL — ABNORMAL HIGH (ref 0.44–1.00)
GFR, Estimated: 57 mL/min — ABNORMAL LOW (ref >60)
Glucose, Bld: 238 mg/dL — ABNORMAL HIGH (ref 70–99)
Potassium: 3.4 mmol/L — ABNORMAL LOW (ref 3.5–5.1)
Sodium: 133 mmol/L — ABNORMAL LOW (ref 135–145)
Total Bilirubin: 1.6 mg/dL — ABNORMAL HIGH (ref 0.3–1.2)
Total Protein: 5.6 g/dL — ABNORMAL LOW (ref 6.5–8.1)

## 2021-12-20 LAB — CBC WITH DIFFERENTIAL/PLATELET
Abs Immature Granulocytes: 0.07 10*3/uL (ref 0.00–0.07)
Basophils Absolute: 0.1 10*3/uL (ref 0.0–0.1)
Basophils Relative: 1 %
Eosinophils Absolute: 0.1 10*3/uL (ref 0.0–0.5)
Eosinophils Relative: 1 %
HCT: 27.8 % — ABNORMAL LOW (ref 36.0–46.0)
Hemoglobin: 9.3 g/dL — ABNORMAL LOW (ref 12.0–15.0)
Immature Granulocytes: 1 %
Lymphocytes Relative: 20 %
Lymphs Abs: 2.5 10*3/uL (ref 0.7–4.0)
MCH: 35.2 pg — ABNORMAL HIGH (ref 26.0–34.0)
MCHC: 33.5 g/dL (ref 30.0–36.0)
MCV: 105.3 fL — ABNORMAL HIGH (ref 80.0–100.0)
Monocytes Absolute: 1.3 10*3/uL — ABNORMAL HIGH (ref 0.1–1.0)
Monocytes Relative: 11 %
Neutro Abs: 8.5 10*3/uL — ABNORMAL HIGH (ref 1.7–7.7)
Neutrophils Relative %: 66 %
Platelets: 219 10*3/uL (ref 150–400)
RBC: 2.64 MIL/uL — ABNORMAL LOW (ref 3.87–5.11)
RDW: 18.6 % — ABNORMAL HIGH (ref 11.5–15.5)
WBC: 12.6 10*3/uL — ABNORMAL HIGH (ref 4.0–10.5)
nRBC: 0 % (ref 0.0–0.2)

## 2021-12-20 LAB — I-STAT CHEM 8, ED
BUN: 7 mg/dL (ref 6–20)
Calcium, Ion: 1.05 mmol/L — ABNORMAL LOW (ref 1.15–1.40)
Chloride: 100 mmol/L (ref 98–111)
Creatinine, Ser: 1 mg/dL (ref 0.44–1.00)
Glucose, Bld: 229 mg/dL — ABNORMAL HIGH (ref 70–99)
HCT: 32 % — ABNORMAL LOW (ref 36.0–46.0)
Hemoglobin: 10.9 g/dL — ABNORMAL LOW (ref 12.0–15.0)
Potassium: 3.3 mmol/L — ABNORMAL LOW (ref 3.5–5.1)
Sodium: 132 mmol/L — ABNORMAL LOW (ref 135–145)
TCO2: 21 mmol/L — ABNORMAL LOW (ref 22–32)

## 2021-12-20 LAB — TROPONIN I (HIGH SENSITIVITY)
Troponin I (High Sensitivity): 30 ng/L — ABNORMAL HIGH (ref <18)
Troponin I (High Sensitivity): 34 ng/L — ABNORMAL HIGH (ref <18)

## 2021-12-20 LAB — BRAIN NATRIURETIC PEPTIDE: B Natriuretic Peptide: 47.7 pg/mL (ref 0.0–100.0)

## 2021-12-20 LAB — I-STAT BETA HCG BLOOD, ED (MC, WL, AP ONLY): I-stat hCG, quantitative: <5 m[IU]/mL (ref <5)

## 2021-12-20 MED ORDER — FENTANYL CITRATE PF 50 MCG/ML IJ SOSY
50.0000 ug | PREFILLED_SYRINGE | Freq: Once | INTRAMUSCULAR | Status: AC
  Administered 2021-12-20: 50 ug via INTRAVENOUS
  Filled 2021-12-20: qty 1

## 2021-12-20 MED ORDER — CEPHALEXIN 500 MG PO CAPS: 500.0000 mg | ORAL_CAPSULE | Freq: Four times a day (QID) | ORAL | 0 refills | Status: DC

## 2021-12-20 MED ORDER — MIDODRINE HCL 10 MG PO TABS: 10.0000 mg | ORAL_TABLET | Freq: Three times a day (TID) | ORAL | 0 refills | Status: DC

## 2021-12-20 MED ORDER — SODIUM CHLORIDE 0.9 % IV SOLN
1.0000 g | Freq: Once | INTRAVENOUS | Status: DC
  Filled 2021-12-20: qty 10

## 2021-12-20 MED ORDER — OXYCODONE HCL 5 MG PO TABS: 5.0000 mg | ORAL_TABLET | ORAL | 0 refills | Status: DC | PRN

## 2021-12-20 MED ORDER — FUROSEMIDE 20 MG PO TABS: 20.0000 mg | ORAL_TABLET | Freq: Every day | ORAL | 0 refills | Status: DC

## 2021-12-20 MED ORDER — FUROSEMIDE 10 MG/ML IJ SOLN
40.0000 mg | Freq: Once | INTRAMUSCULAR | Status: DC
  Filled 2021-12-20: qty 4

## 2021-12-20 MED ORDER — IOHEXOL 350 MG/ML SOLN
100.0000 mL | Freq: Once | INTRAVENOUS | Status: AC | PRN
  Administered 2021-12-20: 100 mL via INTRAVENOUS

## 2021-12-20 MED ORDER — SPIRONOLACTONE 25 MG PO TABS: 50.0000 mg | ORAL_TABLET | Freq: Every day | ORAL | 0 refills | Status: DC

## 2021-12-20 MED ORDER — SPIRONOLACTONE 25 MG PO TABS: 25.0000 mg | ORAL_TABLET | Freq: Every day | ORAL | 0 refills | Status: DC

## 2021-12-20 MED ORDER — POTASSIUM CHLORIDE CRYS ER 20 MEQ PO TBCR
40.0000 meq | EXTENDED_RELEASE_TABLET | Freq: Once | ORAL | Status: AC
  Administered 2021-12-20: 40 meq via ORAL
  Filled 2021-12-20: qty 2

## 2021-12-20 MED ORDER — FUROSEMIDE 20 MG PO TABS
40.0000 mg | ORAL_TABLET | Freq: Once | ORAL | Status: DC
  Filled 2021-12-20: qty 2

## 2021-12-20 NOTE — Discharge Instructions (Addendum)
Follow with Primary MD Day, [name], MD in 7 days kindly follow-up with the requested nephrologist and gastroenterologist within 1 to 2 weeks  Get CBC, CMP, Magnesium -  checked next visit within 1 week by Primary MD   Activity: As tolerated with Full fall precautions use walker/cane & assistance as needed  Disposition Home   Diet: Heart Healthy Low Carb with strict 1200 cc of total fluid restriction per day.  Accuchecks 4 times/day, Once in AM empty stomach and then before each meal. Log in all results and show them to your Prim.MD in 3 days. If any glucose reading is under 80 or above 300 call your Prim MD immidiately. Follow Low glucose instructions for glucose under 80 as instructed.   Special Instructions: If you have smoked or chewed Tobacco  in the last 2 yrs please stop smoking, stop any regular Alcohol  and or any Recreational drug use.  On your next visit with your primary care physician please Get Medicines reviewed and adjusted.  Please request your Prim.MD to go over all Hospital Tests and Procedure/Radiological results at the follow up, please get all Hospital records sent to your Prim MD by signing hospital release before you go home.  If you experience worsening of your admission symptoms, develop shortness of breath, life threatening emergency, suicidal or homicidal thoughts you must seek medical attention immediately by calling 911 or calling your MD immediately  if symptoms less severe.  You Must read complete instructions/literature along with all the possible adverse reactions/side effects for all the Medicines you take and that have been prescribed to you. Take any new Medicines after you have completely understood and accpet all the possible adverse reactions/side effects.

## 2021-12-20 NOTE — ED Provider Notes (Signed)
[facility] EMERGENCY DEPARTMENT Provider Note   CSN: [id number] Arrival date & time: 12/20/21  1544     History  Chief Complaint  Patient presents with  . Leg Swelling    Anna Garcia is a 41 y.o. female.  HPI   41 year old female presents emergency department with complaints of bilateral lower extremity swelling and pain.  Patient states that she was recently discharged from the hospital in similar condition but the pain has since worsened.  She is also noticed blistering rashes pop up on the dorsal aspect of bilateral feet as well as some redness extending up her bilateral legs.  She also has swelling in her abdomen that she states is gotten worse and has noted some shortness of breath that is increased since her discharge as well.  She also describes nausea as well as vomiting but states this is not changed in intensity or frequency over the past few months.  Denies fever, chills, night sweats, chest pain, urinary/vaginal symptoms, change in bowel habits, hematemesis, hematochezia, melena.  She states that she used alcohol once since her prior discharge and denies any illicit drug use.  She recently mated to the hospital on 12/11/2021 for DKA.  Lower extremity ultrasound for DVT rule out was negative from 12/09/2021.  Echocardiogram from 12/15/2021 showed low ventricular ejection fraction of 60 to 65% with normal function as well as right ventricular normal function.  Lower extremity swelling felt to be due to liver cirrhosis with concerning changes on ultrasound of right upper quadrant for liver cirrhosis with hepatic steatosis.   Home Medications Prior to Admission medications   Medication Sig Start Date End Date Taking? Authorizing Provider  BAQSIMI TWO PACK 3 MG/DOSE POWD Place 1 spray into both nostrils as needed (for a very low blood sugar emergency, when unable to eat or drink and need help from someone else- or as otherwise directed). 10/30/21   [provider]  DEXILANT 60 MG capsule Take 1 capsule (60 mg total) by mouth daily. 09/26/21   Regalado, Belkys A, MD  dicyclomine (BENTYL) 10 MG capsule Take 1 capsule (10 mg total) by mouth 4 (four) times daily -  before meals and at bedtime. 12/11/21   Samuella Cota, MD  diphenhydrAMINE (BENADRYL ALLERGY) 25 mg capsule Take 1 capsule (25 mg total) by mouth every 6 (six) hours as needed. 12/18/21   Shelly Coss, MD  famotidine (PEPCID) 40 MG tablet Take 40 mg by mouth 2 (two) times daily. 05/28/19   [provider]  ferrous sulfate 325 (65 FE) MG tablet Take 1 tablet by mouth daily with breakfast. 12/19/21   Shelly Coss, MD  gabapentin (NEURONTIN) 300 MG capsule Take 1 capsule (300 mg total) by mouth 3 (three) times daily. 05/28/21   Eugenie Filler, MD  granisetron (KYTRIL) 1 MG tablet Take 1 mg by mouth every 12 (twelve) hours. 11/05/21   [provider]  hydrocortisone cream 0.5 % Apply 1 Application topically 2 (two) times daily. Apply on rash on hands 12/18/21   Shelly Coss, MD  insulin glargine (LANTUS SOLOSTAR) 100 UNIT/ML Solostar Pen Inject 8 Units into the skin 2 times daily. 12/18/21   Shelly Coss, MD  insulin glulisine (APIDRA SOLOSTAR) 100 UNIT/ML Solostar Pen Inject 0 - 8 Units into the skin daily AS NEEDED if blood glucose levels are trending upwards- changes as per carb intake 12/18/21   Shelly Coss, MD  lipase/protease/amylase (CREON) 36000 UNITS CPEP capsule Take 1 capsule (36,000  Units total) by mouth 3 (three) times daily with meals. 12/11/21   Samuella Cota, MD  loperamide (IMODIUM) 2 MG capsule Take 1 capsule (2 mg total) by mouth as needed for diarrhea or loose stools. 09/26/21   Regalado, Belkys A, MD  metoCLOPramide (REGLAN) 10 MG tablet Take 1 tablet by mouth every 6 hours as needed for nausea. 12/18/21 01/17/22  Shelly Coss, MD  mometasone-formoterol (DULERA) 100-5 MCG/ACT AERO Inhale 2 puffs into the lungs 2 (two) times daily. 07/07/20    Mercy Riding, MD  montelukast (SINGULAIR) 10 MG tablet Take 10 mg by mouth at bedtime. 05/24/19   [provider]  MOTEGRITY 1 MG TABS Take 1 mg by mouth daily. 08/31/21   [provider]  rosuvastatin (CRESTOR) 20 MG tablet Take 20 mg by mouth at bedtime. 05/24/19   [provider]  insulin aspart (NOVOLOG) 100 UNIT/ML injection Inject 3 units three times a day with meals if you eat over 50% of your meals. In addition, use sliding scale as follows: CBG 70 - 120: 0 units CBG 121 - 150: 2 units CBG 151 - 200: 3 units CBG 201 - 250: 5 units CBG 251 - 300: 8 units CBG 301 - 350: 11 units CBG 351 - 400: 15 units CBG > 400: call MD and obtain STAT lab verification Patient not taking: No sig reported 07/07/20 10/13/20  Mercy Riding, MD      Allergies    Patient has no known allergies.    Review of Systems   Review of Systems  All other systems reviewed and are negative.   Physical Exam Updated Vital Signs BP (!) 95/55   Pulse (!) 116   Temp 98.4 F (36.9 C) (Oral)   Resp 14   Ht '5\' 4"'$  (1.626 m)   SpO2 99%   BMI 33.72 kg/m  Physical Exam Vitals and nursing note reviewed.  Constitutional:      General: She is not in acute distress.    Appearance: She is well-developed. She is obese.  HENT:     Head: Normocephalic and atraumatic.  Eyes:     Conjunctiva/sclera: Conjunctivae normal.  Cardiovascular:     Rate and Rhythm: Normal rate and regular rhythm.     Heart sounds: No murmur heard. Pulmonary:     Effort: Pulmonary effort is normal. No respiratory distress.     Breath sounds: Normal breath sounds.  Abdominal:     General: There is distension.     Tenderness: There is no abdominal tenderness. There is no right CVA tenderness or left CVA tenderness.     Comments: Grossly distended abdomen with diffuse tenderness.  Musculoskeletal:        General: No swelling.     Cervical back: Neck supple.     Right lower leg: Edema present.     Left lower leg: Edema  present.     Comments: 3+ pitting edema noted bilateral lower extremities.  Lower extremities are warm to the touch with extending erythema.  See pictures below.  Skin:    General: Skin is warm and dry.     Capillary Refill: Capillary refill takes less than 2 seconds.  Neurological:     Mental Status: She is alert.  Psychiatric:        Mood and Affect: Mood normal.          ED Results / Procedures / Treatments   Labs (all labs ordered are listed, but only abnormal results are  displayed) Labs Reviewed  COMPREHENSIVE METABOLIC PANEL - Abnormal; Notable for the following components:      Result Value   Sodium 133 (*)    Potassium 3.4 (*)    CO2 19 (*)    Glucose, Bld 238 (*)    Creatinine, Ser 1.23 (*)    Calcium 8.6 (*)    Total Protein 5.6 (*)    Albumin 2.3 (*)    Alkaline Phosphatase 191 (*)    Total Bilirubin 1.6 (*)    GFR, Estimated 57 (*)    All other components within normal limits  CBC WITH DIFFERENTIAL/PLATELET - Abnormal; Notable for the following components:   WBC 12.6 (*)    RBC 2.64 (*)    Hemoglobin 9.3 (*)    HCT 27.8 (*)    MCV 105.3 (*)    MCH 35.2 (*)    RDW 18.6 (*)    Neutro Abs 8.5 (*)    Monocytes Absolute 1.3 (*)    All other components within normal limits  I-STAT CHEM 8, ED - Abnormal; Notable for the following components:   Sodium 132 (*)    Potassium 3.3 (*)    Glucose, Bld 229 (*)    Calcium, Ion 1.05 (*)    TCO2 21 (*)    Hemoglobin 10.9 (*)    HCT 32.0 (*)    All other components within normal limits  CBG MONITORING, ED - Abnormal; Notable for the following components:   Glucose-Capillary 233 (*)    All other components within normal limits  TROPONIN I (HIGH SENSITIVITY) - Abnormal; Notable for the following components:   Troponin I (High Sensitivity) 30 (*)    All other components within normal limits  TROPONIN I (HIGH SENSITIVITY) - Abnormal; Notable for the following components:   Troponin I (High Sensitivity) 34 (*)     All other components within normal limits  CULTURE, BLOOD (ROUTINE X 2)  CULTURE, BLOOD (ROUTINE X 2)  BRAIN NATRIURETIC PEPTIDE  URINALYSIS, ROUTINE W REFLEX MICROSCOPIC  I-STAT BETA HCG BLOOD, ED ([facility], [facility], [facility] ONLY)    EKG None  Radiology CT Abdomen Pelvis W Contrast  Result Date: 12/20/2021 CLINICAL DATA:  Chest pain abdominal pain EXAM: CT ANGIOGRAPHY CHEST CT ABDOMEN AND PELVIS WITH CONTRAST TECHNIQUE: Multidetector CT imaging of the chest was performed using the standard protocol during bolus administration of intravenous contrast. Multiplanar CT image reconstructions and MIPs were obtained to evaluate the vascular anatomy. Multidetector CT imaging of the abdomen and pelvis was performed using the standard protocol during bolus administration of intravenous contrast. RADIATION DOSE REDUCTION: This exam was performed according to the departmental dose-optimization program which includes automated exposure control, adjustment of the mA and/or kV according to patient size and/or use of iterative reconstruction technique. CONTRAST:  130m OMNIPAQUE IOHEXOL 350 MG/ML SOLN COMPARISON:  12/06/2021, chest x-ray 12/20/2021, CT chest 04/11/2011 FINDINGS: CTA CHEST FINDINGS Cardiovascular: Satisfactory opacification of the pulmonary arteries to the segmental level. No evidence of pulmonary embolism. Normal heart size. No pericardial effusion. Nonaneurysmal aorta. No dissection is seen. Mediastinum/Nodes: No enlarged mediastinal, hilar, or axillary lymph nodes. Thyroid gland, trachea, and esophagus demonstrate no significant findings. Lungs/Pleura: Subsegmental atelectasis in the bases. No acute airspace disease, pleural effusion or pneumothorax Musculoskeletal: No chest wall abnormality. No acute or significant osseous findings. Review of the MIP images confirms the above findings. CT ABDOMEN and PELVIS FINDINGS Hepatobiliary: Liver cirrhosis. Slightly distorted appearance of the central parenchyma. Prominent  central liver enhancement. Contracted gallbladder without calcified  stone. Pancreas: No inflammation. Suspect significant atrophy. Not much pancreatic tissue identified. Spleen: Normal in size without focal abnormality. Adrenals/Urinary Tract: Adrenal glands are unremarkable. Kidneys are normal, without renal calculi, focal lesion, or hydronephrosis. Bladder is unremarkable. Stomach/Bowel: Stomach is nonenlarged. No dilated small bowel. Multiple thickened small bowel loops most evident in the left upper quadrant involving the jejunum. There is diffuse colon wall thickening. Vascular/Lymphatic: Nonaneurysmal aorta. Mild atherosclerosis. Subcentimeter retroperitoneal lymph nodes. Upper intra hepatic IVC appears slightly narrowed. Reproductive: IUD in the uterus.  No adnexal mass. Other: Moderate ascites within the abdomen and pelvis. No free air. Generalized edema consistent with anasarca. Musculoskeletal: No acute osseous abnormality. Review of the MIP images confirms the above findings. IMPRESSION: 1. No definite CT evidence for acute pulmonary embolus. Subsegmental atelectasis at the bases. 2. Liver cirrhosis with distorted appearance of the central parenchyma suggestive of fibrosis. When the patient is clinically stable and able to follow directions and hold their breath (preferably as an outpatient) further evaluation with dedicated abdominal MRI should be considered. 3. Prominent small bowel thickening and diffuse colon wall thickening. Differential considerations include enterocolitis versus nonspecific bowel edema secondary to edematous state or hypoproteinemia. 4. Moderate volume of abdominopelvic ascites. Generalized subcutaneous edema consistent with anasarca. Electronically Signed   By: Donavan Foil M.D.   On: 12/20/2021 19:46   CT Angio Chest PE W/Cm &/Or Wo Cm  Result Date: 12/20/2021 CLINICAL DATA:  Chest pain abdominal pain EXAM: CT ANGIOGRAPHY CHEST CT ABDOMEN AND PELVIS WITH CONTRAST TECHNIQUE:  Multidetector CT imaging of the chest was performed using the standard protocol during bolus administration of intravenous contrast. Multiplanar CT image reconstructions and MIPs were obtained to evaluate the vascular anatomy. Multidetector CT imaging of the abdomen and pelvis was performed using the standard protocol during bolus administration of intravenous contrast. RADIATION DOSE REDUCTION: This exam was performed according to the departmental dose-optimization program which includes automated exposure control, adjustment of the mA and/or kV according to patient size and/or use of iterative reconstruction technique. CONTRAST:  18m OMNIPAQUE IOHEXOL 350 MG/ML SOLN COMPARISON:  12/06/2021, chest x-ray 12/20/2021, CT chest 04/11/2011 FINDINGS: CTA CHEST FINDINGS Cardiovascular: Satisfactory opacification of the pulmonary arteries to the segmental level. No evidence of pulmonary embolism. Normal heart size. No pericardial effusion. Nonaneurysmal aorta. No dissection is seen. Mediastinum/Nodes: No enlarged mediastinal, hilar, or axillary lymph nodes. Thyroid gland, trachea, and esophagus demonstrate no significant findings. Lungs/Pleura: Subsegmental atelectasis in the bases. No acute airspace disease, pleural effusion or pneumothorax Musculoskeletal: No chest wall abnormality. No acute or significant osseous findings. Review of the MIP images confirms the above findings. CT ABDOMEN and PELVIS FINDINGS Hepatobiliary: Liver cirrhosis. Slightly distorted appearance of the central parenchyma. Prominent central liver enhancement. Contracted gallbladder without calcified stone. Pancreas: No inflammation. Suspect significant atrophy. Not much pancreatic tissue identified. Spleen: Normal in size without focal abnormality. Adrenals/Urinary Tract: Adrenal glands are unremarkable. Kidneys are normal, without renal calculi, focal lesion, or hydronephrosis. Bladder is unremarkable. Stomach/Bowel: Stomach is nonenlarged. No  dilated small bowel. Multiple thickened small bowel loops most evident in the left upper quadrant involving the jejunum. There is diffuse colon wall thickening. Vascular/Lymphatic: Nonaneurysmal aorta. Mild atherosclerosis. Subcentimeter retroperitoneal lymph nodes. Upper intra hepatic IVC appears slightly narrowed. Reproductive: IUD in the uterus.  No adnexal mass. Other: Moderate ascites within the abdomen and pelvis. No free air. Generalized edema consistent with anasarca. Musculoskeletal: No acute osseous abnormality. Review of the MIP images confirms the above findings. IMPRESSION: 1. No definite CT evidence for  acute pulmonary embolus. Subsegmental atelectasis at the bases. 2. Liver cirrhosis with distorted appearance of the central parenchyma suggestive of fibrosis. When the patient is clinically stable and able to follow directions and hold their breath (preferably as an outpatient) further evaluation with dedicated abdominal MRI should be considered. 3. Prominent small bowel thickening and diffuse colon wall thickening. Differential considerations include enterocolitis versus nonspecific bowel edema secondary to edematous state or hypoproteinemia. 4. Moderate volume of abdominopelvic ascites. Generalized subcutaneous edema consistent with anasarca. Electronically Signed   By: Donavan Foil M.D.   On: 12/20/2021 19:46   DG Chest Port 1 View  Result Date: 12/20/2021 CLINICAL DATA:  Shortness of breath. EXAM: PORTABLE CHEST 1 VIEW COMPARISON:  Chest x-ray 12/04/2021 FINDINGS: The heart size and mediastinal contours are within normal limits. Both lungs are clear. The visualized skeletal structures are unremarkable. IMPRESSION: No active disease. Electronically Signed   By: Ronney Asters M.D.   On: 12/20/2021 17:21    Procedures Procedures    Medications Ordered in ED Medications  cefTRIAXone (ROCEPHIN) 1 g in sodium chloride 0.9 % 100 mL IVPB (has no administration in time range)  fentaNYL  (SUBLIMAZE) injection 50 mcg (50 mcg Intravenous Given 12/20/21 1633)  fentaNYL (SUBLIMAZE) injection 50 mcg (50 mcg Intravenous Given 12/20/21 1750)  iohexol (OMNIPAQUE) 350 MG/ML injection 100 mL (100 mLs Intravenous Contrast Given 12/20/21 1908)  potassium chloride SA (KLOR-CON M) CR tablet 40 mEq (40 mEq Oral Given 12/20/21 2316)    ED Course/ Medical Decision Making/ A&P Clinical Course as of 12/21/21 0027  Thu Dec 20, 2021  2210 [facility] hospital medicine Dr. Posey Pronto.  He stated that he would see the patient in person but likely discharge with following medication adjustment.  Close follow-up with patient's gastroenterologist recommended. 50 spironolactone and 20 lasix Keflex  Midodrine to 10 x3 daily  [CR]  Fri Dec 21, 2021  0023 Dr. Posey Pronto was again consulted regarding the patient for admission after evaluated by attending Dr. Truett Mainland.  He agreed with admission and assuming further treatment/care. [CR]    Clinical Course User Index [CR] Wilnette Kales, PA                           Medical Decision Making Amount and/or Complexity of Data Reviewed Labs: ordered. Radiology: ordered.  Risk Prescription drug management.   This patient presents to the ED for concern of leg swelling/pain, this involves an extensive number of treatment options, and is a complaint that carries with it a high risk of complications and morbidity.  The differential diagnosis includes cellulitis, erysipelas, abscess, osteomyelitis, necrotizing fasciitis, anasarca, CHF, pulmonary embolism, ACS,   Co morbidities that complicate the patient evaluation  See HPI   Additional history obtained:  Additional history obtained from EMR External records from outside source obtained and reviewed including see HPI   Lab Tests:  I Ordered, and personally interpreted labs.  The pertinent results include: Sodium 133, potassium 3.4 which is supplemented orally with 40 milliequivalent of potassium.    Imaging  Studies ordered:  I ordered imaging studies including chest x-ray, CT angio chest for PE, CT abdomen pelvis I independently visualized and interpreted imaging which showed  Chest x-ray: No acute cardiopulmonary process CT angio chest: No evidence of pulmonary embolism 1.  Subsegmental atelectasis at bases. CT abdomen pelvis: Liver cirrhosis with distorted appearance of central parenchyma suggesting fibrosis.  Prominent small bowel thickening and diffuse colon wall thickening.  Moderate  volume of abdominal pelvic ascites.  Generalized subcutaneous edema consistent with anasarca. I agree with the radiologist interpretation  Cardiac Monitoring: / EKG:  The patient was maintained on a cardiac monitor.  I personally viewed and interpreted the cardiac monitored which showed an underlying rhythm of: Sinus rhythm   Consultations Obtained:  See ED course  Problem List / ED Course / Critical interventions / Medication management  Leg pain/swelling I ordered medication including 40 mg of Lasix as well as 40 mEq of potassium for diuresis and potassium supplementation.  Fentanyl for pain.   Reevaluation of the patient after these medicines showed that the patient improved I have reviewed the patients home medicines and have made adjustments as needed   Social Determinants of Health:  Chronic alcohol use.  Former cigarette use.  Denies illicit drug use.   Test / Admission - Considered:  Vitals signs significant for persistent tachycardia with a rate of 116.  Patient seems to have baseline tachycardia with a negative work-up for PE, cardiac ischemia, CHF.  Patient seems to have baseline mild hypotension of which she takes midodrine at home.  Blood pressure upon admission was 95/55. Otherwise within normal range and stable throughout visit. Laboratory/imaging studies significant for: See above Patient symptoms likely secondary secondary cellulitic skin infection from edematous skin from known  liver cirrhosis.  Patient meets sepsis criteria given elevated white count, persistent tachycardia as well as source of infection.  Broad-spectrum antibiotics for skin and soft tissue infections were begun while in the emergency department.  Blood cultures were obtained.  Hospital medicine was consulted regarding the patient Treatment care was discussed at length with patient and she acknowledged understanding was agreeable to said plan.  Appropriate consultations were made as depicted in ED course.  Patient was stable upon admission to the hospital.         Final Clinical Impression(s) / ED Diagnoses Final diagnoses:  Hypervolemia, unspecified hypervolemia type  Anasarca  Sepsis, due to unspecified organism, unspecified whether acute organ dysfunction present [facility])    Rx / DC Orders      Wilnette Kales, PA 12/21/21 0028    Cristie Hem, MD 12/21/21 0111

## 2021-12-20 NOTE — ED Notes (Signed)
Patient transported to CT 

## 2021-12-20 NOTE — ED Provider Notes (Incomplete)
[facility] EMERGENCY DEPARTMENT Provider Note   CSN: [id number] Arrival date & time: 12/20/21  1544     History {Add pertinent medical, surgical, social history, OB history to HPI:1} Chief Complaint  Patient presents with  . Leg Swelling    Anna Garcia is a 41 y.o. female.  HPI   41 year old female presents emergency department with complaints of bilateral lower extremity swelling and pain.  Patient states that she was recently discharged from the hospital in similar condition but the pain has since worsened.  She is also noticed blistering rashes pop up on the dorsal aspect of bilateral feet as well as some redness extending up her bilateral legs.  She also has swelling in her abdomen that she states is gotten worse and has noted some shortness of breath that is increased since her discharge as well.  She also describes nausea as well as vomiting but states this is not changed in intensity or frequency over the past few months.  Denies fever, chills, night sweats, chest pain, urinary/vaginal symptoms, change in bowel habits, hematemesis, hematochezia, melena.  She states that she used alcohol once since her prior discharge and denies any illicit drug use.  She recently mated to the hospital on 12/11/2021 for DKA.  Lower extremity ultrasound for DVT rule out was negative from 12/09/2021.  Echocardiogram from 12/15/2021 showed low ventricular ejection fraction of 60 to 65% with normal function as well as right ventricular normal function.  Lower extremity swelling felt to be due to liver cirrhosis with concerning changes on ultrasound of right upper quadrant for liver cirrhosis with hepatic steatosis.   Home Medications Prior to Admission medications   Medication Sig Start Date End Date Taking? Authorizing Provider  BAQSIMI TWO PACK 3 MG/DOSE POWD Place 1 spray into both nostrils as needed (for a very low blood sugar emergency, when unable to eat or drink and need help from  someone else- or as otherwise directed). 10/30/21   [provider]  DEXILANT 60 MG capsule Take 1 capsule (60 mg total) by mouth daily. 09/26/21   Regalado, Belkys A, MD  dicyclomine (BENTYL) 10 MG capsule Take 1 capsule (10 mg total) by mouth 4 (four) times daily -  before meals and at bedtime. 12/11/21   Samuella Cota, MD  diphenhydrAMINE (BENADRYL ALLERGY) 25 mg capsule Take 1 capsule (25 mg total) by mouth every 6 (six) hours as needed. 12/18/21   Shelly Coss, MD  famotidine (PEPCID) 40 MG tablet Take 40 mg by mouth 2 (two) times daily. 05/28/19   [provider]  ferrous sulfate 325 (65 FE) MG tablet Take 1 tablet by mouth daily with breakfast. 12/19/21   Shelly Coss, MD  gabapentin (NEURONTIN) 300 MG capsule Take 1 capsule (300 mg total) by mouth 3 (three) times daily. 05/28/21   Eugenie Filler, MD  granisetron (KYTRIL) 1 MG tablet Take 1 mg by mouth every 12 (twelve) hours. 11/05/21   [provider]  hydrocortisone cream 0.5 % Apply 1 Application topically 2 (two) times daily. Apply on rash on hands 12/18/21   Shelly Coss, MD  insulin glargine (LANTUS SOLOSTAR) 100 UNIT/ML Solostar Pen Inject 8 Units into the skin 2 times daily. 12/18/21   Shelly Coss, MD  insulin glulisine (APIDRA SOLOSTAR) 100 UNIT/ML Solostar Pen Inject 0 - 8 Units into the skin daily AS NEEDED if blood glucose levels are trending upwards- changes as per carb intake 12/18/21   Shelly Coss, MD  lipase/protease/amylase (  CREON) 36000 UNITS CPEP capsule Take 1 capsule (36,000 Units total) by mouth 3 (three) times daily with meals. 12/11/21   Samuella Cota, MD  loperamide (IMODIUM) 2 MG capsule Take 1 capsule (2 mg total) by mouth as needed for diarrhea or loose stools. 09/26/21   Regalado, Belkys A, MD  metoCLOPramide (REGLAN) 10 MG tablet Take 1 tablet by mouth every 6 hours as needed for nausea. 12/18/21 01/17/22  Shelly Coss, MD  midodrine (PROAMATINE) 5 MG tablet Take 1 tablet  by mouth 3 times daily with meals. 12/18/21   Shelly Coss, MD  mometasone-formoterol (DULERA) 100-5 MCG/ACT AERO Inhale 2 puffs into the lungs 2 (two) times daily. 07/07/20   Mercy Riding, MD  montelukast (SINGULAIR) 10 MG tablet Take 10 mg by mouth at bedtime. 05/24/19   [provider]  MOTEGRITY 1 MG TABS Take 1 mg by mouth daily. 08/31/21   [provider]  potassium chloride SA (KLOR-CON M) 20 MEQ tablet Take 2 tablets by mouth once daily for 7 days. 12/18/21 12/25/21  Shelly Coss, MD  rosuvastatin (CRESTOR) 20 MG tablet Take 20 mg by mouth at bedtime. 05/24/19   [provider]  insulin aspart (NOVOLOG) 100 UNIT/ML injection Inject 3 units three times a day with meals if you eat over 50% of your meals. In addition, use sliding scale as follows: CBG 70 - 120: 0 units CBG 121 - 150: 2 units CBG 151 - 200: 3 units CBG 201 - 250: 5 units CBG 251 - 300: 8 units CBG 301 - 350: 11 units CBG 351 - 400: 15 units CBG > 400: call MD and obtain STAT lab verification Patient not taking: No sig reported 07/07/20 10/13/20  Mercy Riding, MD      Allergies    Patient has no known allergies.    Review of Systems   Review of Systems  All other systems reviewed and are negative.   Physical Exam Updated Vital Signs SpO2 100%  Physical Exam Vitals and nursing note reviewed.  Constitutional:      General: She is not in acute distress.    Appearance: She is well-developed. She is obese.  HENT:     Head: Normocephalic and atraumatic.  Eyes:     Conjunctiva/sclera: Conjunctivae normal.  Cardiovascular:     Rate and Rhythm: Normal rate and regular rhythm.     Heart sounds: No murmur heard. Pulmonary:     Effort: Pulmonary effort is normal. No respiratory distress.     Breath sounds: Normal breath sounds.  Abdominal:     General: There is distension.     Tenderness: There is no abdominal tenderness. There is no right CVA tenderness or left CVA tenderness.     Comments:  Grossly distended abdomen with diffuse tenderness.  Musculoskeletal:        General: No swelling.     Cervical back: Neck supple.     Right lower leg: Edema present.     Left lower leg: Edema present.     Comments: 3+ pitting edema noted bilateral lower extremities.  Lower extremities are warm to the touch with extending erythema.  See pictures below.  Skin:    General: Skin is warm and dry.     Capillary Refill: Capillary refill takes less than 2 seconds.  Neurological:     Mental Status: She is alert.  Psychiatric:        Mood and Affect: Mood normal.  ED Results / Procedures / Treatments   Labs (all labs ordered are listed, but only abnormal results are displayed) Labs Reviewed  COMPREHENSIVE METABOLIC PANEL  CBC WITH DIFFERENTIAL/PLATELET  URINALYSIS, ROUTINE W REFLEX MICROSCOPIC  BRAIN NATRIURETIC PEPTIDE  I-STAT CHEM 8, ED  CBG MONITORING, ED  TROPONIN I (HIGH SENSITIVITY)    EKG None  Radiology No results found.  Procedures Procedures  {Document cardiac monitor, telemetry assessment procedure when appropriate:1}  Medications Ordered in ED Medications  fentaNYL (SUBLIMAZE) injection 50 mcg (has no administration in time range)    ED Course/ Medical Decision Making/ A&P Clinical Course as of 12/20/21 2255  Thu Dec 20, 2021  2210 [facility] hospital medicine Dr. Posey Pronto.  50 spironolactone and 20 lasix Keflex  Midodrine to 10 x3 daily  [CR]    Clinical Course User Index [CR] Wilnette Kales, PA                           Medical Decision Making Amount and/or Complexity of Data Reviewed Labs: ordered. Radiology: ordered.  Risk Prescription drug management.   This patient presents to the ED for concern of leg swelling/pain, this involves an extensive number of treatment options, and is a complaint that carries with it a high risk of complications and morbidity.  The differential diagnosis includes cellulitis, erysipelas, abscess,  osteomyelitis, necrotizing fasciitis, anasarca, CHF, pulmonary embolism, ACS,   Co morbidities that complicate the patient evaluation  See HPI   Additional history obtained:  Additional history obtained from EMR External records from outside source obtained and reviewed including see HPI   Lab Tests:  I Ordered, and personally interpreted labs.  The pertinent results include: Sodium 133, potassium 3.4 which is supplemented orally with 40 milliequivalent of potassium.    Imaging Studies ordered:  I ordered imaging studies including chest x-ray, CT angio chest for PE, CT abdomen pelvis I independently visualized and interpreted imaging which showed  Chest x-ray: No acute cardiopulmonary process CT angio chest: No evidence of pulmonary embolism 1.  Subsegmental atelectasis at bases. CT abdomen pelvis: Liver cirrhosis with distorted appearance of central parenchyma suggesting fibrosis.  Prominent small bowel thickening and diffuse colon wall thickening.  Moderate volume of abdominal pelvic ascites.  Generalized subcutaneous edema consistent with anasarca. I agree with the radiologist interpretation  Cardiac Monitoring: / EKG:  The patient was maintained on a cardiac monitor.  I personally viewed and interpreted the cardiac monitored which showed an underlying rhythm of: Sinus rhythm   Consultations Obtained:  See ED course  Problem List / ED Course / Critical interventions / Medication management  Leg pain/swelling I ordered medication including 40 mg of Lasix as well as 40 mEq of potassium for diuresis and potassium supplementation.  Fentanyl for pain.   Reevaluation of the patient after these medicines showed that the patient improved I have reviewed the patients home medicines and have made adjustments as needed   Social Determinants of Health:  Chronic alcohol use.  Former cigarette use.  Denies illicit drug use.   Test / Admission - Considered:  Vitals signs  significant for persistent tachycardia with a rate of 116.  Patient seems to have baseline tachycardia with a negative work-up for PE, cardiac ischemia, CHF.  Patient also has low blood pressure of which she takes midodrine for at home.  This will be increased outpatient to 10 mg taken about lowering her blood pressure with diuresis. Otherwise within normal range and stable  throughout visit. Laboratory/imaging studies significant for: See above Patient symptoms likely secondary to known liver cirrhosis with evidence of fibrosis.  Patient has known substance history with alcohol if she continues to partake.  She again educated regarding the toxic effects of alcohol on the liver.  I discussed the case with Dr. Posey Pronto Treatment plan were discussed at length with patient and they knowledge understanding was agreeable to said plan.  Appropriate consultations were made as described in the ED course.  Patient was stable upon admission to the hospital.   {Document critical care time when appropriate:1} {Document review of labs and clinical decision tools ie heart score, Chads2Vasc2 etc:1}  {Document your independent review of radiology images, and any outside records:1} {Document your discussion with family members, caretakers, and with consultants:1} {Document social determinants of health affecting pt's care:1} {Document your decision making why or why not admission, treatments were needed:1} Final Clinical Impression(s) / ED Diagnoses Final diagnoses:  None    Rx / DC Orders ED Discharge Orders     None

## 2021-12-20 NOTE — Consult Note (Deleted)
Initial Consultation Note   Patient: Anna Garcia UKG:[id number] DOB: [birth year] PCP: [name], MD DOA: 12/20/2021 DOS: the patient was seen and examined on 12/20/2021 Primary service: Cristie Hem, MD  Referring physician: Dion Saucier, [place] Reason for consult: Anasarca  Assessment and Plan:  Cirrhosis of liver with ascites: Recent imaging consistent with liver cirrhosis morphology.  This is likely the cause of her anasarca.  She is mentating well without evidence of hepatic encephalopathy.  She reports regular bowel movements daily.  She states that she has a PCP and gastroenterologist who she can follow-up with.  At this point I think she is stable for outpatient management with the addition of diuretic regimen. -Recommend starting on spironolactone 50 mg and Lasix 20 mg -Can increase spironolactone to 100 mg and Lasix 40 mg as tolerated -Follow-up with PCP in next 1-2 weeks for reassessment and repeat lab work -Follow-up with gastroenterology  Hypotension: Previously hypertensive, now hypotensive in setting of cirrhosis.  Recently started on midodrine 5 mg TI for BP support. -Recommend increase midodrine to 10 mg TID with addition of spironolactone/Lasix  Right dorsal foot wound: Has area of superficial skin loss right dorsal foot, does not appear acutely infected but can consider course of Keflex if concerned for developing cellulitis.  Alcohol use: Patient reports increased alcohol use recently, several shots of liquor daily.  She denies any withdrawal symptoms.  I have advised her on complete cessation from alcohol given her liver cirrhosis findings.  Generalized weakness: Patient states that she has been in contact with physical therapy and I encouraged her to continue to work with PT.  TRH will sign off at present, please call us again when needed.  HPI:  Anna Garcia is a 41 y.o. female with medical history significant for type 1 diabetes with gastroparesis,  asthma, inadequate esophageal motility with GERD, chronic urinary retention who self caths, chronic macrocytic anemia, chronic pain syndrome/fibromyalgia, depression/anxiety, recent imaging compatible with liver cirrhosis who presented to the ED for evaluation of lower extremity edema.  Patient recently admitted 12/11/2021-12/18/2021 for DKA.  She was noted to have anasarca and persistent sinus tachycardia.  She was initially given IV Lasix.  TTE showed EF 60-65%, indeterminate LV diastolic parameters.  RUQ ultrasound showed changes indicative of liver cirrhosis with background of hepatic steatosis.  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  Work-up included negative antimitochondrial antibodies, anti-smooth muscle antibodies, RF, ANA, ceruloplasmin.  Her edema was felt related to hypoalbuminemia.  She became increasingly confused while in the hospital and required transient IVC which was discontinued after psychiatry evaluation.  Patient returned to the ED for evaluation of persistent lower extremity swelling and pain.  She has felt generally weak.  She is tearful due to no significant improvement despite recent hospitalizations.  I explained to her imaging findings concerning for liver cirrhosis which are likely cause of her peripheral edema/anasarca.  We discussed adding a diuretic regimen, alcohol cessation, and recommended that she have close follow-up with her PCP in the next 1-2 weeks as well as her gastroenterologist as soon as feasible for close follow-up and management of her liver cirrhosis.  ED course: Initial vitals showed BP 1 2/64, pulse 121, RR 14, temp 98.4 F, SPO2 100% on room air.  Labs show WBC 12.6, hemoglobin 9.3, platelets 219,000, sodium 133, potassium 3.4, bicarb 19, BUN 8, creatinine 1.23, serum glucose 238, AST 39, ALT 30, alk phos 191, total bilirubin 1.6, BNP 47.7, troponin 30 > 34,  i-STAT beta ECG <5.0.  Portable chest x-ray negative for focal consolidation,  edema, effusion.  CTA chest and CT abdomen/pelvis with contrast negative for evidence of PE.  Liver cirrhosis with changes suggestive of fibrosis noted.  Prominent small bowel thickening and diffuse colon wall thickening and moderate volume of abdominopelvic ascites and generalized subcutaneous edema noted.  Patient was given IV Lasix 40 mg and oral potassium 40 mEq.  The hospitalist service was consulted for further medical recommendations.  Review of Systems: As mentioned in the history of present illness. All other systems reviewed and are negative. Past Medical History:  Diagnosis Date   Diabetes mellitus    Gastroparesis    Hypertension    Neuropathy    Past Surgical History:  Procedure Laterality Date   ESOPHAGOGASTRODUODENOSCOPY (EGD) WITH PROPOFOL N/A 09/24/2021   Procedure: ESOPHAGOGASTRODUODENOSCOPY (EGD) WITH PROPOFOL;  Surgeon: Clarene Essex, MD;  Location: [facility];  Service: Gastroenterology;  Laterality: N/A;   SAVORY DILATION N/A 09/24/2021   Procedure: SAVORY DILATION - 16 MM;  Surgeon: Clarene Essex, MD;  Location: [facility];  Service: Gastroenterology;  Laterality: N/A;   Social History:  reports that she has quit smoking. Her smoking use included cigarettes. She smoked an average of .5 packs per day. She has never used smokeless tobacco. She reports that she does not drink alcohol and does not use drugs.  No Known Allergies  Family History  Problem Relation Age of Onset   Hypertension Mother    Dementia Mother    Heart attack Father    Hypertension Father    Heart disease Father    Hypertension Brother    Stroke Maternal Grandfather     Prior to Admission medications   Medication Sig Start Date End Date Taking? Authorizing Provider  BAQSIMI TWO PACK 3 MG/DOSE POWD Place 1 spray into both nostrils as needed (for a very low blood sugar emergency, when unable to eat or drink and need help from someone else- or as otherwise directed). 10/30/21   [provider]  DEXILANT 60 MG capsule Take 1 capsule (60 mg total) by mouth daily. 09/26/21   Regalado, Belkys A, MD  dicyclomine (BENTYL) 10 MG capsule Take 1 capsule (10 mg total) by mouth 4 (four) times daily -  before meals and at bedtime. 12/11/21   Samuella Cota, MD  diphenhydrAMINE (BENADRYL ALLERGY) 25 mg capsule Take 1 capsule (25 mg total) by mouth every 6 (six) hours as needed. 12/18/21   Shelly Coss, MD  famotidine (PEPCID) 40 MG tablet Take 40 mg by mouth 2 (two) times daily. 05/28/19   [provider]  ferrous sulfate 325 (65 FE) MG tablet Take 1 tablet by mouth daily with breakfast. 12/19/21   Shelly Coss, MD  gabapentin (NEURONTIN) 300 MG capsule Take 1 capsule (300 mg total) by mouth 3 (three) times daily. 05/28/21   Eugenie Filler, MD  granisetron (KYTRIL) 1 MG tablet Take 1 mg by mouth every 12 (twelve) hours. 11/05/21   [provider]  hydrocortisone cream 0.5 % Apply 1 Application topically 2 (two) times daily. Apply on rash on hands 12/18/21   Shelly Coss, MD  insulin glargine (LANTUS SOLOSTAR) 100 UNIT/ML Solostar Pen Inject 8 Units into the skin 2 times daily. 12/18/21   Shelly Coss, MD  insulin glulisine (APIDRA SOLOSTAR) 100 UNIT/ML Solostar Pen Inject 0 - 8 Units into the skin daily AS NEEDED if blood glucose levels are trending upwards- changes as per carb intake 12/18/21  Shelly Coss, MD  lipase/protease/amylase (CREON) 36000 UNITS CPEP capsule Take 1 capsule (36,000 Units total) by mouth 3 (three) times daily with meals. 12/11/21   Samuella Cota, MD  loperamide (IMODIUM) 2 MG capsule Take 1 capsule (2 mg total) by mouth as needed for diarrhea or loose stools. 09/26/21   Regalado, Belkys A, MD  metoCLOPramide (REGLAN) 10 MG tablet Take 1 tablet by mouth every 6 hours as needed for nausea. 12/18/21 01/17/22  Shelly Coss, MD  midodrine (PROAMATINE) 5 MG tablet Take 1 tablet by mouth 3 times daily with meals. 12/18/21   Shelly Coss, MD   mometasone-formoterol (DULERA) 100-5 MCG/ACT AERO Inhale 2 puffs into the lungs 2 (two) times daily. 07/07/20   Mercy Riding, MD  montelukast (SINGULAIR) 10 MG tablet Take 10 mg by mouth at bedtime. 05/24/19   [provider]  MOTEGRITY 1 MG TABS Take 1 mg by mouth daily. 08/31/21   [provider]  potassium chloride SA (KLOR-CON M) 20 MEQ tablet Take 2 tablets by mouth once daily for 7 days. 12/18/21 12/25/21  Shelly Coss, MD  rosuvastatin (CRESTOR) 20 MG tablet Take 20 mg by mouth at bedtime. 05/24/19   [provider]  insulin aspart (NOVOLOG) 100 UNIT/ML injection Inject 3 units three times a day with meals if you eat over 50% of your meals. In addition, use sliding scale as follows: CBG 70 - 120: 0 units CBG 121 - 150: 2 units CBG 151 - 200: 3 units CBG 201 - 250: 5 units CBG 251 - 300: 8 units CBG 301 - 350: 11 units CBG 351 - 400: 15 units CBG > 400: call MD and obtain STAT lab verification Patient not taking: No sig reported 07/07/20 10/13/20  Mercy Riding, MD    Physical Exam: Vitals:   12/20/21 2015 12/20/21 2030 12/20/21 2045 12/20/21 2245  BP: 97/65 107/65 101/63 (!) 95/55  Pulse: (!) 117  (!) 120 (!) 116  Resp: 12 14 17 14   Temp:      TempSrc:      SpO2: 100%  100% 99%  Height:       General: resting in bed, tearful HEENT: EOMI, EOMI, no scleral icterus Cardiac: Tachycardic, no rubs, murmurs or gallops Pulm: clear to auscultation bilaterally Abd: soft, nontender Ext: +1-2 bilateral lower extremity edema Neuro: alert and oriented X3        Data Reviewed:   There are no new results to review at this time.    Family Communication: Discussed with patient Primary team communication: Discussed with ED provider, Dion Saucier, [place] Thank you very much for involving [place] in the care of your patient.  Author: Zada Finders, MD 12/20/2021 11:07 PM  For on call review [email or web address].

## 2021-12-20 NOTE — ED Triage Notes (Signed)
Pt BIB [facility] from home for BLE edema after recent d/c for same. Hx of same. A&Ox4, NAD

## 2021-12-21 ENCOUNTER — Inpatient Hospital Stay (HOSPITAL_COMMUNITY): Payer: Medicare Other

## 2021-12-21 DIAGNOSIS — K7460 Unspecified cirrhosis of liver: Principal | ICD-10-CM

## 2021-12-21 DIAGNOSIS — R339 Retention of urine, unspecified: Secondary | ICD-10-CM | POA: Diagnosis present

## 2021-12-21 DIAGNOSIS — N179 Acute kidney failure, unspecified: Secondary | ICD-10-CM | POA: Diagnosis not present

## 2021-12-21 DIAGNOSIS — Z794 Long term (current) use of insulin: Secondary | ICD-10-CM | POA: Diagnosis not present

## 2021-12-21 DIAGNOSIS — R531 Weakness: Secondary | ICD-10-CM | POA: Diagnosis not present

## 2021-12-21 DIAGNOSIS — R7881 Bacteremia: Secondary | ICD-10-CM | POA: Diagnosis not present

## 2021-12-21 DIAGNOSIS — Z789 Other specified health status: Secondary | ICD-10-CM | POA: Diagnosis not present

## 2021-12-21 DIAGNOSIS — J9601 Acute respiratory failure with hypoxia: Secondary | ICD-10-CM | POA: Diagnosis not present

## 2021-12-21 DIAGNOSIS — R443 Hallucinations, unspecified: Secondary | ICD-10-CM | POA: Diagnosis not present

## 2021-12-21 DIAGNOSIS — J690 Pneumonitis due to inhalation of food and vomit: Secondary | ICD-10-CM | POA: Diagnosis not present

## 2021-12-21 DIAGNOSIS — D539 Nutritional anemia, unspecified: Secondary | ICD-10-CM | POA: Diagnosis present

## 2021-12-21 DIAGNOSIS — E785 Hyperlipidemia, unspecified: Secondary | ICD-10-CM | POA: Diagnosis present

## 2021-12-21 DIAGNOSIS — L03115 Cellulitis of right lower limb: Secondary | ICD-10-CM | POA: Diagnosis present

## 2021-12-21 DIAGNOSIS — E10649 Type 1 diabetes mellitus with hypoglycemia without coma: Secondary | ICD-10-CM | POA: Diagnosis not present

## 2021-12-21 DIAGNOSIS — R188 Other ascites: Secondary | ICD-10-CM | POA: Diagnosis not present

## 2021-12-21 DIAGNOSIS — Z23 Encounter for immunization: Secondary | ICD-10-CM | POA: Diagnosis present

## 2021-12-21 DIAGNOSIS — B954 Other streptococcus as the cause of diseases classified elsewhere: Secondary | ICD-10-CM | POA: Diagnosis present

## 2021-12-21 DIAGNOSIS — R4182 Altered mental status, unspecified: Secondary | ICD-10-CM | POA: Diagnosis not present

## 2021-12-21 DIAGNOSIS — E1043 Type 1 diabetes mellitus with diabetic autonomic (poly)neuropathy: Secondary | ICD-10-CM | POA: Diagnosis present

## 2021-12-21 DIAGNOSIS — R601 Generalized edema: Secondary | ICD-10-CM | POA: Diagnosis present

## 2021-12-21 DIAGNOSIS — E1042 Type 1 diabetes mellitus with diabetic polyneuropathy: Secondary | ICD-10-CM | POA: Diagnosis present

## 2021-12-21 DIAGNOSIS — F32A Depression, unspecified: Secondary | ICD-10-CM | POA: Diagnosis present

## 2021-12-21 DIAGNOSIS — K3184 Gastroparesis: Secondary | ICD-10-CM | POA: Diagnosis present

## 2021-12-21 DIAGNOSIS — K766 Portal hypertension: Secondary | ICD-10-CM | POA: Diagnosis present

## 2021-12-21 DIAGNOSIS — G9341 Metabolic encephalopathy: Secondary | ICD-10-CM | POA: Diagnosis not present

## 2021-12-21 DIAGNOSIS — E1065 Type 1 diabetes mellitus with hyperglycemia: Secondary | ICD-10-CM | POA: Diagnosis present

## 2021-12-21 DIAGNOSIS — G9340 Encephalopathy, unspecified: Secondary | ICD-10-CM | POA: Diagnosis not present

## 2021-12-21 DIAGNOSIS — I959 Hypotension, unspecified: Secondary | ICD-10-CM | POA: Diagnosis present

## 2021-12-21 DIAGNOSIS — K652 Spontaneous bacterial peritonitis: Secondary | ICD-10-CM | POA: Diagnosis present

## 2021-12-21 DIAGNOSIS — I10 Essential (primary) hypertension: Secondary | ICD-10-CM | POA: Diagnosis present

## 2021-12-21 DIAGNOSIS — M7989 Other specified soft tissue disorders: Secondary | ICD-10-CM | POA: Diagnosis not present

## 2021-12-21 DIAGNOSIS — E669 Obesity, unspecified: Secondary | ICD-10-CM | POA: Diagnosis present

## 2021-12-21 DIAGNOSIS — J4540 Moderate persistent asthma, uncomplicated: Secondary | ICD-10-CM | POA: Diagnosis present

## 2021-12-21 DIAGNOSIS — A419 Sepsis, unspecified organism: Secondary | ICD-10-CM | POA: Diagnosis not present

## 2021-12-21 HISTORY — PX: IR PARACENTESIS: IMG2679

## 2021-12-21 LAB — CBC
HCT: 27.6 % — ABNORMAL LOW (ref 36.0–46.0)
Hemoglobin: 9.1 g/dL — ABNORMAL LOW (ref 12.0–15.0)
MCH: 35.7 pg — ABNORMAL HIGH (ref 26.0–34.0)
MCHC: 33 g/dL (ref 30.0–36.0)
MCV: 108.2 fL — ABNORMAL HIGH (ref 80.0–100.0)
Platelets: 222 10*3/uL (ref 150–400)
RBC: 2.55 MIL/uL — ABNORMAL LOW (ref 3.87–5.11)
RDW: 18.6 % — ABNORMAL HIGH (ref 11.5–15.5)
WBC: 13.5 10*3/uL — ABNORMAL HIGH (ref 4.0–10.5)
nRBC: 0 % (ref 0.0–0.2)

## 2021-12-21 LAB — GLUCOSE, CAPILLARY
Glucose-Capillary: 244 mg/dL — ABNORMAL HIGH (ref 70–99)
Glucose-Capillary: 192 mg/dL — ABNORMAL HIGH (ref 70–99)

## 2021-12-21 LAB — CBG MONITORING, ED
Glucose-Capillary: 245 mg/dL — ABNORMAL HIGH (ref 70–99)
Glucose-Capillary: 254 mg/dL — ABNORMAL HIGH (ref 70–99)
Glucose-Capillary: 312 mg/dL — ABNORMAL HIGH (ref 70–99)

## 2021-12-21 LAB — URINALYSIS, ROUTINE W REFLEX MICROSCOPIC
Bilirubin Urine: NEGATIVE
Glucose, UA: 50 mg/dL — AB
Hgb urine dipstick: NEGATIVE
Ketones, ur: NEGATIVE mg/dL
Nitrite: NEGATIVE
Protein, ur: NEGATIVE mg/dL
Specific Gravity, Urine: 1.027 (ref 1.005–1.030)
pH: 6 (ref 5.0–8.0)

## 2021-12-21 LAB — BODY FLUID CELL COUNT WITH DIFFERENTIAL
Eos, Fluid: 0 %
Lymphs, Fluid: 3 %
Monocyte-Macrophage-Serous Fluid: 12 % — ABNORMAL LOW (ref 50–90)
Neutrophil Count, Fluid: 85 % — ABNORMAL HIGH (ref 0–25)
Total Nucleated Cell Count, Fluid: 590 cu mm (ref 0–1000)

## 2021-12-21 LAB — COMPREHENSIVE METABOLIC PANEL
ALT: 28 U/L (ref 0–44)
AST: 37 U/L (ref 15–41)
Albumin: 2.3 g/dL — ABNORMAL LOW (ref 3.5–5.0)
Alkaline Phosphatase: 189 U/L — ABNORMAL HIGH (ref 38–126)
Anion gap: 10 (ref 5–15)
BUN: 8 mg/dL (ref 6–20)
CO2: 20 mmol/L — ABNORMAL LOW (ref 22–32)
Calcium: 8.4 mg/dL — ABNORMAL LOW (ref 8.9–10.3)
Chloride: 101 mmol/L (ref 98–111)
Creatinine, Ser: 1.22 mg/dL — ABNORMAL HIGH (ref 0.44–1.00)
GFR, Estimated: 58 mL/min — ABNORMAL LOW (ref >60)
Glucose, Bld: 185 mg/dL — ABNORMAL HIGH (ref 70–99)
Potassium: 3.6 mmol/L (ref 3.5–5.1)
Sodium: 131 mmol/L — ABNORMAL LOW (ref 135–145)
Total Bilirubin: 1.8 mg/dL — ABNORMAL HIGH (ref 0.3–1.2)
Total Protein: 5.7 g/dL — ABNORMAL LOW (ref 6.5–8.1)

## 2021-12-21 LAB — GRAM STAIN

## 2021-12-21 LAB — ALBUMIN, PLEURAL OR PERITONEAL FLUID: Albumin, Fluid: <1.5 g/dL

## 2021-12-21 LAB — PROCALCITONIN: Procalcitonin: 0.67 ng/mL

## 2021-12-21 MED ORDER — INSULIN GLARGINE-YFGN 100 UNIT/ML ~~LOC~~ SOLN
8.0000 [IU] | Freq: Two times a day (BID) | SUBCUTANEOUS | Status: DC
  Administered 2021-12-21 (×2): 8 [IU] via SUBCUTANEOUS
  Filled 2021-12-21 (×3): qty 0.08

## 2021-12-21 MED ORDER — INSULIN ASPART 100 UNIT/ML IJ SOLN
0.0000 [IU] | Freq: Every day | INTRAMUSCULAR | Status: DC
  Administered 2021-12-21: 2 [IU] via SUBCUTANEOUS

## 2021-12-21 MED ORDER — MOMETASONE FURO-FORMOTEROL FUM 100-5 MCG/ACT IN AERO
2.0000 | INHALATION_SPRAY | Freq: Two times a day (BID) | RESPIRATORY_TRACT | Status: DC
Start: 2021-12-21 — End: 2022-01-04
  Administered 2021-12-23 – 2022-01-04 (×19): 2 via RESPIRATORY_TRACT
  Filled 2021-12-21 (×2): qty 8.8

## 2021-12-21 MED ORDER — INSULIN GLARGINE-YFGN 100 UNIT/ML ~~LOC~~ SOLN
12.0000 [IU] | Freq: Two times a day (BID) | SUBCUTANEOUS | Status: DC
  Administered 2021-12-21 – 2021-12-23 (×5): 12 [IU] via SUBCUTANEOUS
  Filled 2021-12-21 (×7): qty 0.12

## 2021-12-21 MED ORDER — SODIUM CHLORIDE 0.9 % IV SOLN
1.0000 g | INTRAVENOUS | Status: DC
  Administered 2021-12-21: 1 g via INTRAVENOUS

## 2021-12-21 MED ORDER — FAMOTIDINE 20 MG PO TABS
40.0000 mg | ORAL_TABLET | Freq: Two times a day (BID) | ORAL | Status: DC
  Administered 2021-12-21 – 2022-01-04 (×28): 40 mg via ORAL
  Filled 2021-12-21 (×29): qty 2

## 2021-12-21 MED ORDER — SODIUM CHLORIDE 0.9% FLUSH
3.0000 mL | Freq: Two times a day (BID) | INTRAVENOUS | Status: DC
  Administered 2021-12-21 – 2022-01-04 (×26): 3 mL via INTRAVENOUS

## 2021-12-21 MED ORDER — ALBUMIN HUMAN 25 % IV SOLN
25.0000 g | Freq: Three times a day (TID) | INTRAVENOUS | Status: AC
  Administered 2021-12-21 – 2021-12-25 (×14): 25 g via INTRAVENOUS
  Filled 2021-12-21 (×12): qty 100

## 2021-12-21 MED ORDER — FUROSEMIDE 20 MG PO TABS
20.0000 mg | ORAL_TABLET | Freq: Every day | ORAL | Status: DC
  Administered 2021-12-21: 20 mg via ORAL
  Filled 2021-12-21: qty 1

## 2021-12-21 MED ORDER — MIDODRINE HCL 5 MG PO TABS
10.0000 mg | ORAL_TABLET | Freq: Three times a day (TID) | ORAL | Status: DC
  Administered 2021-12-21 – 2022-01-04 (×41): 10 mg via ORAL
  Filled 2021-12-21 (×42): qty 2

## 2021-12-21 MED ORDER — ALBUTEROL SULFATE (2.5 MG/3ML) 0.083% IN NEBU: 2.5000 mg | INHALATION_SOLUTION | RESPIRATORY_TRACT | Status: DC | PRN

## 2021-12-21 MED ORDER — OXYCODONE HCL 5 MG PO TABS
5.0000 mg | ORAL_TABLET | ORAL | Status: DC | PRN
  Administered 2021-12-21 – 2021-12-22 (×6): 5 mg via ORAL
  Filled 2021-12-21 (×6): qty 1

## 2021-12-21 MED ORDER — GABAPENTIN 300 MG PO CAPS
300.0000 mg | ORAL_CAPSULE | Freq: Three times a day (TID) | ORAL | Status: DC
  Administered 2021-12-21 – 2021-12-23 (×8): 300 mg via ORAL
  Filled 2021-12-21 (×9): qty 1

## 2021-12-21 MED ORDER — INSULIN ASPART 100 UNIT/ML IJ SOLN
0.0000 [IU] | Freq: Three times a day (TID) | INTRAMUSCULAR | Status: DC
  Administered 2021-12-21: 3 [IU] via SUBCUTANEOUS
  Administered 2021-12-21: 5 [IU] via SUBCUTANEOUS
  Administered 2021-12-21: 7 [IU] via SUBCUTANEOUS
  Administered 2021-12-22: 1 [IU] via SUBCUTANEOUS
  Administered 2021-12-22 (×2): 2 [IU] via SUBCUTANEOUS

## 2021-12-21 MED ORDER — SPIRONOLACTONE 25 MG PO TABS
50.0000 mg | ORAL_TABLET | Freq: Every day | ORAL | Status: DC
  Administered 2021-12-21: 50 mg via ORAL
  Filled 2021-12-21: qty 2

## 2021-12-21 MED ORDER — LOPERAMIDE HCL 2 MG PO CAPS
2.0000 mg | ORAL_CAPSULE | Freq: Every day | ORAL | Status: DC | PRN
Start: 2021-12-21 — End: 2022-01-04

## 2021-12-21 MED ORDER — ACETAMINOPHEN 325 MG PO TABS
650.0000 mg | ORAL_TABLET | Freq: Four times a day (QID) | ORAL | Status: DC | PRN
  Administered 2021-12-23 – 2021-12-29 (×8): 650 mg via ORAL
  Filled 2021-12-21 (×8): qty 2

## 2021-12-21 MED ORDER — METOCLOPRAMIDE HCL 5 MG PO TABS
10.0000 mg | ORAL_TABLET | Freq: Three times a day (TID) | ORAL | Status: DC
  Administered 2021-12-21 – 2022-01-04 (×37): 10 mg via ORAL
  Filled 2021-12-21 (×19): qty 2
  Filled 2021-12-21: qty 1
  Filled 2021-12-21: qty 2
  Filled 2021-12-21: qty 1
  Filled 2021-12-21 (×16): qty 2

## 2021-12-21 MED ORDER — PANCRELIPASE (LIP-PROT-AMYL) 12000-38000 UNITS PO CPEP
36000.0000 [IU] | ORAL_CAPSULE | Freq: Three times a day (TID) | ORAL | Status: DC
  Administered 2021-12-21 – 2022-01-04 (×34): 36000 [IU] via ORAL
  Filled 2021-12-21 (×8): qty 3
  Filled 2021-12-21 (×2): qty 1
  Filled 2021-12-21 (×2): qty 3
  Filled 2021-12-21: qty 1
  Filled 2021-12-21: qty 3
  Filled 2021-12-21: qty 1
  Filled 2021-12-21 (×5): qty 3
  Filled 2021-12-21 (×2): qty 1
  Filled 2021-12-21: qty 3
  Filled 2021-12-21: qty 1
  Filled 2021-12-21: qty 3
  Filled 2021-12-21: qty 1
  Filled 2021-12-21 (×6): qty 3
  Filled 2021-12-21: qty 1
  Filled 2021-12-21 (×4): qty 3

## 2021-12-21 MED ORDER — ROSUVASTATIN CALCIUM 20 MG PO TABS
20.0000 mg | ORAL_TABLET | Freq: Every day | ORAL | Status: DC
  Administered 2021-12-21 – 2022-01-03 (×12): 20 mg via ORAL
  Filled 2021-12-21 (×13): qty 1

## 2021-12-21 MED ORDER — MONTELUKAST SODIUM 10 MG PO TABS
10.0000 mg | ORAL_TABLET | Freq: Every day | ORAL | Status: DC
  Administered 2021-12-21 – 2022-01-03 (×13): 10 mg via ORAL
  Filled 2021-12-21 (×14): qty 1

## 2021-12-21 MED ORDER — ENOXAPARIN SODIUM 40 MG/0.4ML IJ SOSY
40.0000 mg | PREFILLED_SYRINGE | INTRAMUSCULAR | Status: DC
  Administered 2021-12-21 – 2022-01-04 (×14): 40 mg via SUBCUTANEOUS
  Filled 2021-12-21 (×16): qty 0.4

## 2021-12-21 MED ORDER — DICYCLOMINE HCL 10 MG PO CAPS
10.0000 mg | ORAL_CAPSULE | Freq: Three times a day (TID) | ORAL | Status: DC
  Administered 2021-12-21 – 2021-12-31 (×34): 10 mg via ORAL
  Filled 2021-12-21 (×37): qty 1

## 2021-12-21 MED ORDER — SODIUM CHLORIDE 0.9 % IV SOLN: 1.0000 g | Freq: Once | INTRAVENOUS | Status: DC

## 2021-12-21 MED ORDER — LIDOCAINE HCL 1 % IJ SOLN
INTRAMUSCULAR | Status: AC
  Administered 2021-12-21: 20 mL
  Filled 2021-12-21: qty 20

## 2021-12-21 NOTE — Assessment & Plan Note (Signed)
Patient reports increased alcohol use recently, several shots of liquor daily.  She denies any withdrawal symptoms.  I have advised her on complete cessation from alcohol given her liver cirrhosis findings.

## 2021-12-21 NOTE — Assessment & Plan Note (Signed)
Hemoglobin stable at 9.3.  B61 and folic acid levels normal 3 months ago.

## 2021-12-21 NOTE — Inpatient Diabetes Management (Signed)
Inpatient Diabetes Program Recommendations  AACE/ADA: New Consensus Statement on Inpatient Glycemic Control (2015)  Target Ranges:  Prepandial:   less than 140 mg/dL      Peak postprandial:   less than 180 mg/dL (1-2 hours)      Critically ill patients:  140 - 180 mg/dL    Latest Reference Range & Units 12/21/21 01:45 12/21/21 08:08 12/21/21 11:25  Glucose-Capillary 70 - 99 mg/dL 245 (H) 254 (H) 312 (H)    Review of Glycemic Control  Diabetes history: DM1 Outpatient Diabetes medications: Lantus 8 units BID, Apidra 0-8 units (correction and meal coverage) Current orders for Inpatient glycemic control: Semglee 12 units BID, Novolog 0-9 units TID with meals, Novolog 0-5 units QHS  Inpatient Diabetes Program Recommendations:    Insulin: Please consider ordering Novolog 3 units TID with meals for meal coverage if patient eats at least 50% of meals.  NOTE: Patient was recently inpatient 8/22-8/29/23 and patient was seen by Diabetes Coordinator on 12/17/21 during that admission. Patient admitted with ascites, hypotension, and cellulitis of right foot. Noted Semglee increased from 8 to 12 units BID.   Thanks, Barnie Alderman, RN, MSN, [place] Diabetes Coordinator Inpatient Diabetes Program [phone] (Team Pager from 8am to [place])

## 2021-12-21 NOTE — Assessment & Plan Note (Signed)
Stable without wheezing on admission.  Continue Dulera and Singulair.

## 2021-12-21 NOTE — Assessment & Plan Note (Addendum)
Previously hypertensive, now hypotensive in setting of cirrhosis.  Recently started on midodrine 5 mg TID for BP support. -Increase midodrine to 10 mg TID with addition of Spiro/Lasix

## 2021-12-21 NOTE — Assessment & Plan Note (Signed)
Continue Semglee 8 units twice daily, add SSI.

## 2021-12-21 NOTE — ED Notes (Signed)
Pt tearing up during ambulation to restroom. Gait slow and steady. Legs swollen with redness at the foot. Pt updated on plan of care. Pt with slow low responses.

## 2021-12-21 NOTE — Progress Notes (Signed)
PROGRESS NOTE        PATIENT DETAILS Name: Anna Garcia Age: 41 y.o. Sex: female Date of Birth: 12-13-1980 Admit Date: 12/20/2021 Admitting Physician Lenore Cordia, MD PCP:Day, [name], MD  Brief Summary: Patient is a 41 y.o.  female with history of DM-1, gastroparesis, chronic pain syndrome/fibromyalgia, HTN, chronic urinary retention-self caths at home-who was just discharged from this facility on 8/29 after being treated for DKA/AKI/volume overload-presented back to the hospital with anasarca-3+ pitting edema in the lower extremity up to the thighs.  Significant events: 8/22-8/29>> hospitalization for DKA/AKI-found to have liver cirrhosis 8/31>> admit for anasarca/significant lower extremity edema.  Significant studies: 8/24>> bilateral lower extremity Doppler: No DVT. 8/26>> Echo: EF-60-65%. 8/31>> CXR: No PNA 8/31>> CT chest/abdomen/pelvis: No PE, liver cirrhosis, prominent small bowel wall thickening, moderate volume ascites.  Generalized subcutaneous edema. 9/01>> UA: No proteinuria.  Significant microbiology data: 9/01>> blood culture: No growth  Procedures: None  Consults: None  Subjective: Tearful-complains of pain in her lower extremities due to significant edema.  Anxious-tearful.  Objective: Vitals: Blood pressure 96/69, pulse (!) 106, temperature 99 F (37.2 C), temperature source Oral, resp. rate 19, height '5\' 4"'$  (1.626 m), SpO2 100 %.   Exam: Gen Exam: Anxious/tearful but not in any respiratory distress. HEENT:atraumatic, normocephalic Chest: B/L clear to auscultation anteriorly CVS:S1S2 regular Abdomen: Soft-slightly distended with some dullness on percussion in the flanks. Non tender-but feels discomfort on deeper palpation Extremities: 3+ edema Neurology: Non focal Skin: no rash  Pertinent Labs/Radiology:    Latest Ref Rng & Units 12/21/2021    4:00 AM 12/20/2021    5:09 PM 12/20/2021    4:26 PM  CBC  WBC 4.0 - 10.5  K/uL 13.5   12.6   Hemoglobin 12.0 - 15.0 g/dL 9.1  10.9  9.3   Hematocrit 36.0 - 46.0 % 27.6  32.0  27.8   Platelets 150 - 400 K/uL 222   219     Lab Results  Component Value Date   NA 131 (L) 12/21/2021   K 3.6 12/21/2021   CL 101 12/21/2021   CO2 20 (L) 12/21/2021      Assessment/Plan: Anasarca-3+ pitting lower extremity edema up to thighs: Due to liver cirrhosis-blood pressure soft-to further optimize diuretics at this point.  Continue midodrine-volume  expand with albumin-and attempt to optimize diuretic regimen accordingly.  Cellulitis right foot: Mild-continue ceftriaxone.  Cultures negative so far.  Liver cirrhosis: Diagnosed recently on imaging studies-hepatitis B/C serology and autoimmune work-up negative.  Felt to be either from NASH or history of alcohol use.  Recent EGD June 2023 did not show any obvious varices.  We will check limited ultrasound to see if there is any significant ascites that could be potentially tapped.  History of esophageal stricture: S/p dilatation June 2023.  Denies any vomiting or dysphagia symptoms.  History of diabetic gastroparesis: No vomiting-appears stable-continue Reglan.  Encourage small portion meals.  DM-1 (A1c 7.6 on 7/30): Increase Semglee to 12 units twice daily, continue SSI.  Follow/optimize.  Recent Labs    12/20/21 1837 12/21/21 0145 12/21/21 0808  GLUCAP 233* 245* 254*    HLD: Continue statin  Moderate persistent asthma: Not in flare-continue bronchodilators.  History of chronic urinary retention-s/p self-catheterization at home  Chronic pain/fibromyalgia/neuropathy: Resume Neurontin.  Obesity Estimated body mass index is 33.72 kg/m as calculated from the  following:   Height as of this encounter: '5\' 4"'$  (1.626 m).   Weight as of 12/12/21: 89.1 kg.   Code status:   Code Status: Full Code   DVT Prophylaxis: enoxaparin (LOVENOX) injection 40 mg Start: 12/21/21 1000   Family Communication: None at  bedside   Disposition Plan: Status is: Observation The patient will require care spanning > 2 midnights and should be moved to inpatient because: Severe anasarca having difficulty ambulating-Will need inpatient optimization of her volume status before consideration of discharge.   Planned Discharge Destination:Home likely early next week.   Diet: Diet Order             Diet heart healthy/carb modified Room service appropriate? Yes; Fluid consistency: Thin  Diet effective now                     Antimicrobial agents: Anti-infectives (From admission, onward)    Start     Dose/Rate Route Frequency Ordered Stop   12/21/21 2100  cefTRIAXone (ROCEPHIN) 1 g in sodium chloride 0.9 % 100 mL IVPB        1 g 200 mL/hr over 30 Minutes Intravenous Every 24 hours 12/21/21 0055     12/21/21 0030  cefTRIAXone (ROCEPHIN) 1 g in sodium chloride 0.9 % 100 mL IVPB  Status:  Discontinued        1 g 200 mL/hr over 30 Minutes Intravenous  Once 12/21/21 0015 12/21/21 0055   12/20/21 2145  cefTRIAXone (ROCEPHIN) 1 g in sodium chloride 0.9 % 100 mL IVPB  Status:  Discontinued        1 g 200 mL/hr over 30 Minutes Intravenous  Once 12/20/21 2139 12/20/21 2214   12/20/21 0000  cephALEXin (KEFLEX) 500 MG capsule  Status:  Discontinued        500 mg Oral 4 times daily 12/20/21 2319 12/21/21         MEDICATIONS: Scheduled Meds:  dicyclomine  10 mg Oral TID AC & HS   enoxaparin (LOVENOX) injection  40 mg Subcutaneous Q24H   famotidine  40 mg Oral BID   furosemide  20 mg Oral Daily   insulin aspart  0-5 Units Subcutaneous QHS   insulin aspart  0-9 Units Subcutaneous TID WC   insulin glargine-yfgn  8 Units Subcutaneous BID   metoCLOPramide  10 mg Oral TID AC   midodrine  10 mg Oral TID WC   mometasone-formoterol  2 puff Inhalation BID   montelukast  10 mg Oral QHS   sodium chloride flush  3 mL Intravenous Q12H   spironolactone  50 mg Oral Daily   Continuous Infusions:  albumin human      cefTRIAXone (ROCEPHIN)  IV     PRN Meds:.oxyCODONE   I have personally reviewed following labs and imaging studies  LABORATORY DATA: CBC: Recent Labs  Lab 12/16/21 0117 12/18/21 0553 12/20/21 1626 12/20/21 1709 12/21/21 0400  WBC 8.4 9.1 12.6*  --  13.5*  NEUTROABS  --   --  8.5*  --   --   HGB 8.3* 9.1* 9.3* 10.9* 9.1*  HCT 25.3* 27.6* 27.8* 32.0* 27.6*  MCV 108.1* 108.7* 105.3*  --  108.2*  PLT 176 231 219  --  546    Basic Metabolic Panel: Recent Labs  Lab 12/16/21 0117 12/17/21 0503 12/18/21 0553 12/20/21 1626 12/20/21 1709 12/21/21 0400  NA 134* 134* 142 133* 132* 131*  K 4.0 2.9* 3.0* 3.4* 3.3* 3.6  CL 103 105 108 100  100 101  CO2 21* 23 23 19*  --  20*  GLUCOSE 169* 99 99 238* 229* 185*  BUN '12 13 13 8 7 8  '$ CREATININE 1.13* 1.21* 1.20* 1.23* 1.00 1.22*  CALCIUM 8.1* 8.2* 8.8* 8.6*  --  8.4*    GFR: Estimated Creatinine Clearance: 66.3 mL/min (A) (by C-G formula based on SCr of 1.22 mg/dL (H)).  Liver Function Tests: Recent Labs  Lab 12/17/21 0503 12/18/21 0553 12/20/21 1626 12/21/21 0400  AST 53* 49* 39 37  ALT 37 33 30 28  ALKPHOS 196* 190* 191* 189*  BILITOT 1.5* 1.3* 1.6* 1.8*  PROT 6.2* 6.3* 5.6* 5.7*  ALBUMIN 2.7* 2.8* 2.3* 2.3*   No results for input(s): "LIPASE", "AMYLASE" in the last 168 hours. No results for input(s): "AMMONIA" in the last 168 hours.  Coagulation Profile: No results for input(s): "INR", "PROTIME" in the last 168 hours.  Cardiac Enzymes: No results for input(s): "CKTOTAL", "CKMB", "CKMBINDEX", "TROPONINI" in the last 168 hours.  BNP (last 3 results) No results for input(s): "PROBNP" in the last 8760 hours.  Lipid Profile: No results for input(s): "CHOL", "HDL", "LDLCALC", "TRIG", "CHOLHDL", "LDLDIRECT" in the last 72 hours.  Thyroid Function Tests: No results for input(s): "TSH", "T4TOTAL", "FREET4", "T3FREE", "THYROIDAB" in the last 72 hours.  Anemia Panel: No results for input(s): "VITAMINB12", "FOLATE",  "FERRITIN", "TIBC", "IRON", "RETICCTPCT" in the last 72 hours.  Urine analysis:    Component Value Date/Time   COLORURINE YELLOW 12/21/2021 0809   APPEARANCEUR HAZY (A) 12/21/2021 0809   LABSPEC 1.027 12/21/2021 0809   PHURINE 6.0 12/21/2021 0809   GLUCOSEU 50 (A) 12/21/2021 0809   HGBUR NEGATIVE 12/21/2021 0809   BILIRUBINUR NEGATIVE 12/21/2021 0809   KETONESUR NEGATIVE 12/21/2021 0809   PROTEINUR NEGATIVE 12/21/2021 0809   UROBILINOGEN 0.2 04/11/2011 1437   NITRITE NEGATIVE 12/21/2021 0809   LEUKOCYTESUR SMALL (A) 12/21/2021 0809    Sepsis Labs: Lactic Acid, Venous    Component Value Date/Time   LATICACIDVEN 7.5 ([place]) 12/05/2021 0238    MICROBIOLOGY: Recent Results (from the past 240 hour(s))  Blood culture (routine x 2)     Status: None (Preliminary result)   Collection Time: 12/21/21  4:00 AM   Specimen: BLOOD  Result Value Ref Range Status   Specimen Description BLOOD SITE NOT SPECIFIED  Final   Special Requests   Final    BOTTLES DRAWN AEROBIC AND ANAEROBIC Blood Culture adequate volume   Culture   Final    NO GROWTH < 12 HOURS Performed at [facility], [place] [address], [place], [place] [address]    Report Status PENDING  Incomplete  Blood culture (routine x 2)     Status: None (Preliminary result)   Collection Time: 12/21/21  4:08 AM   Specimen: BLOOD  Result Value Ref Range Status   Specimen Description BLOOD SITE NOT SPECIFIED  Final   Special Requests   Final    BOTTLES DRAWN AEROBIC ONLY Blood Culture results may not be optimal due to an inadequate volume of blood received in culture bottles   Culture   Final    NO GROWTH < 12 HOURS Performed at [facility], [place] [address], [place], [place] [address]    Report Status PENDING  Incomplete    RADIOLOGY STUDIES/RESULTS: CT Abdomen Pelvis W Contrast  Result Date: 12/20/2021 CLINICAL DATA:  Chest pain abdominal pain EXAM: CT ANGIOGRAPHY CHEST CT ABDOMEN AND PELVIS WITH CONTRAST TECHNIQUE:  Multidetector CT imaging of the  chest was performed using the standard protocol during bolus administration of intravenous contrast. Multiplanar CT image reconstructions and MIPs were obtained to evaluate the vascular anatomy. Multidetector CT imaging of the abdomen and pelvis was performed using the standard protocol during bolus administration of intravenous contrast. RADIATION DOSE REDUCTION: This exam was performed according to the departmental dose-optimization program which includes automated exposure control, adjustment of the mA and/or kV according to patient size and/or use of iterative reconstruction technique. CONTRAST:  123m OMNIPAQUE IOHEXOL 350 MG/ML SOLN COMPARISON:  12/06/2021, chest x-ray 12/20/2021, CT chest 04/11/2011 FINDINGS: CTA CHEST FINDINGS Cardiovascular: Satisfactory opacification of the pulmonary arteries to the segmental level. No evidence of pulmonary embolism. Normal heart size. No pericardial effusion. Nonaneurysmal aorta. No dissection is seen. Mediastinum/Nodes: No enlarged mediastinal, hilar, or axillary lymph nodes. Thyroid gland, trachea, and esophagus demonstrate no significant findings. Lungs/Pleura: Subsegmental atelectasis in the bases. No acute airspace disease, pleural effusion or pneumothorax Musculoskeletal: No chest wall abnormality. No acute or significant osseous findings. Review of the MIP images confirms the above findings. CT ABDOMEN and PELVIS FINDINGS Hepatobiliary: Liver cirrhosis. Slightly distorted appearance of the central parenchyma. Prominent central liver enhancement. Contracted gallbladder without calcified stone. Pancreas: No inflammation. Suspect significant atrophy. Not much pancreatic tissue identified. Spleen: Normal in size without focal abnormality. Adrenals/Urinary Tract: Adrenal glands are unremarkable. Kidneys are normal, without renal calculi, focal lesion, or hydronephrosis. Bladder is unremarkable. Stomach/Bowel: Stomach is nonenlarged. No  dilated small bowel. Multiple thickened small bowel loops most evident in the left upper quadrant involving the jejunum. There is diffuse colon wall thickening. Vascular/Lymphatic: Nonaneurysmal aorta. Mild atherosclerosis. Subcentimeter retroperitoneal lymph nodes. Upper intra hepatic IVC appears slightly narrowed. Reproductive: IUD in the uterus.  No adnexal mass. Other: Moderate ascites within the abdomen and pelvis. No free air. Generalized edema consistent with anasarca. Musculoskeletal: No acute osseous abnormality. Review of the MIP images confirms the above findings. IMPRESSION: 1. No definite CT evidence for acute pulmonary embolus. Subsegmental atelectasis at the bases. 2. Liver cirrhosis with distorted appearance of the central parenchyma suggestive of fibrosis. When the patient is clinically stable and able to follow directions and hold their breath (preferably as an outpatient) further evaluation with dedicated abdominal MRI should be considered. 3. Prominent small bowel thickening and diffuse colon wall thickening. Differential considerations include enterocolitis versus nonspecific bowel edema secondary to edematous state or hypoproteinemia. 4. Moderate volume of abdominopelvic ascites. Generalized subcutaneous edema consistent with anasarca. Electronically Signed   By: KDonavan FoilM.D.   On: 12/20/2021 19:46   CT Angio Chest PE W/Cm &/Or Wo Cm  Result Date: 12/20/2021 CLINICAL DATA:  Chest pain abdominal pain EXAM: CT ANGIOGRAPHY CHEST CT ABDOMEN AND PELVIS WITH CONTRAST TECHNIQUE: Multidetector CT imaging of the chest was performed using the standard protocol during bolus administration of intravenous contrast. Multiplanar CT image reconstructions and MIPs were obtained to evaluate the vascular anatomy. Multidetector CT imaging of the abdomen and pelvis was performed using the standard protocol during bolus administration of intravenous contrast. RADIATION DOSE REDUCTION: This exam was  performed according to the departmental dose-optimization program which includes automated exposure control, adjustment of the mA and/or kV according to patient size and/or use of iterative reconstruction technique. CONTRAST:  1029mOMNIPAQUE IOHEXOL 350 MG/ML SOLN COMPARISON:  12/06/2021, chest x-ray 12/20/2021, CT chest 04/11/2011 FINDINGS: CTA CHEST FINDINGS Cardiovascular: Satisfactory opacification of the pulmonary arteries to the segmental level. No evidence of pulmonary embolism. Normal heart size. No pericardial effusion. Nonaneurysmal aorta. No dissection is seen.  Mediastinum/Nodes: No enlarged mediastinal, hilar, or axillary lymph nodes. Thyroid gland, trachea, and esophagus demonstrate no significant findings. Lungs/Pleura: Subsegmental atelectasis in the bases. No acute airspace disease, pleural effusion or pneumothorax Musculoskeletal: No chest wall abnormality. No acute or significant osseous findings. Review of the MIP images confirms the above findings. CT ABDOMEN and PELVIS FINDINGS Hepatobiliary: Liver cirrhosis. Slightly distorted appearance of the central parenchyma. Prominent central liver enhancement. Contracted gallbladder without calcified stone. Pancreas: No inflammation. Suspect significant atrophy. Not much pancreatic tissue identified. Spleen: Normal in size without focal abnormality. Adrenals/Urinary Tract: Adrenal glands are unremarkable. Kidneys are normal, without renal calculi, focal lesion, or hydronephrosis. Bladder is unremarkable. Stomach/Bowel: Stomach is nonenlarged. No dilated small bowel. Multiple thickened small bowel loops most evident in the left upper quadrant involving the jejunum. There is diffuse colon wall thickening. Vascular/Lymphatic: Nonaneurysmal aorta. Mild atherosclerosis. Subcentimeter retroperitoneal lymph nodes. Upper intra hepatic IVC appears slightly narrowed. Reproductive: IUD in the uterus.  No adnexal mass. Other: Moderate ascites within the abdomen and  pelvis. No free air. Generalized edema consistent with anasarca. Musculoskeletal: No acute osseous abnormality. Review of the MIP images confirms the above findings. IMPRESSION: 1. No definite CT evidence for acute pulmonary embolus. Subsegmental atelectasis at the bases. 2. Liver cirrhosis with distorted appearance of the central parenchyma suggestive of fibrosis. When the patient is clinically stable and able to follow directions and hold their breath (preferably as an outpatient) further evaluation with dedicated abdominal MRI should be considered. 3. Prominent small bowel thickening and diffuse colon wall thickening. Differential considerations include enterocolitis versus nonspecific bowel edema secondary to edematous state or hypoproteinemia. 4. Moderate volume of abdominopelvic ascites. Generalized subcutaneous edema consistent with anasarca. Electronically Signed   By: Donavan Foil M.D.   On: 12/20/2021 19:46   DG Chest Port 1 View  Result Date: 12/20/2021 CLINICAL DATA:  Shortness of breath. EXAM: PORTABLE CHEST 1 VIEW COMPARISON:  Chest x-ray 12/04/2021 FINDINGS: The heart size and mediastinal contours are within normal limits. Both lungs are clear. The visualized skeletal structures are unremarkable. IMPRESSION: No active disease. Electronically Signed   By: Ronney Asters M.D.   On: 12/20/2021 17:21     LOS: 0 days   Oren Binet, MD  [facility]    To contact the attending provider between 7A-7P or the covering provider during after hours 7P-7A, please log into the web site [email or web address] and access using universal [place] password for that web site. If you do not have the password, please call the hospital operator.  12/21/2021, 9:50 AM

## 2021-12-21 NOTE — Assessment & Plan Note (Signed)
Persistent sinus tachycardia also noted on prior admission.  Likely reflex tachycardia secondary to hypotension.  CTA negative for PE.

## 2021-12-21 NOTE — Hospital Course (Signed)
Anna Garcia is a 41 y.o. female with medical history significant for type 1 diabetes with gastroparesis, asthma, inadequate esophageal motility with GERD, chronic urinary retention who self caths, chronic macrocytic anemia, chronic pain syndrome/fibromyalgia, depression/anxiety, recent imaging compatible with liver cirrhosis who is admitted with anasarca in setting of hepatic cirrhosis.

## 2021-12-21 NOTE — Assessment & Plan Note (Signed)
Question of mild early cellulitis of right foot.  WBC 12.6.  She has persistent sinus tachycardia which is more likely reflex tachycardia related to hypotension.  Sepsis not ruled in on admission. -Continue IV ceftriaxone for now -Follow blood cultures -De-escalate antibiotics as able

## 2021-12-21 NOTE — Assessment & Plan Note (Addendum)
Recent imaging consistent with liver cirrhosis morphology.  This is likely the cause of her anasarca.  She is mentating well without evidence of hepatic encephalopathy.  She reports regular bowel movements daily.  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 -Start on spironolactone 50 mg and Lasix 20 mg daily -Can increase spironolactone to 100 mg and Lasix to 40 mg based on response/tolerance -EGD 09/24/2021 without evidence of esophageal varices.

## 2021-12-21 NOTE — Procedures (Signed)
PROCEDURE SUMMARY:  Successful image-guided paracentesis from the right lower abdomen.  Yielded 1 liters of hazy yellow fluid.  No immediate complications.  EBL = trace. Patient tolerated well.   Specimen was sent for labs.  Please see imaging section of Epic for full dictation.  If the patient eventually requires >/=2 paracenteses in a 30 day period, screening evaluation by the [facility] will be assessed.   Kerby Borner H Jakaiden Fill PA-C 12/21/2021 3:00 PM

## 2021-12-21 NOTE — Evaluation (Signed)
Physical Therapy Evaluation Patient Details Name: Anna Garcia MRN: [id number] DOB: Sep 12, 1980 Today's Date: 12/21/2021  History of Present Illness  Pt is a 41 y/o female admitted secondary to LE swelling, RLE cellulitis, and liver cirrhosis. PMH includes DM, alcohol abuse, fibromyalgia, chronic pain syndrome, HTN, chronic urinary retention with self caths.  Clinical Impression  Pt admitted secondary to problem above with deficits below. Pt requiring min A for mobility tasks this session. Very slow to move and tearful with weightbearing secondary to pain. Pt currently lives alone. Feel if she progresses well, will likely be able to d/c with HHPT. However, if pt does not progress well, may need post acute therapies. Will continue to follow acutely.        Recommendations for follow up therapy are one component of a multi-disciplinary discharge planning process, led by the attending physician.  Recommendations may be updated based on patient status, additional functional criteria and insurance authorization.  Follow Up Recommendations Other (comment) (TBD pending progression)      Assistance Recommended at Discharge Intermittent Supervision/Assistance  Patient can return home with the following  A little help with walking and/or transfers;A little help with bathing/dressing/bathroom;Assistance with cooking/housework;Help with stairs or ramp for entrance;Assist for transportation    Equipment Recommendations Rolling walker (2 wheels)  Recommendations for Other Services       Functional Status Assessment Patient has had a recent decline in their functional status and demonstrates the ability to make significant improvements in function in a reasonable and predictable amount of time.     Precautions / Restrictions Precautions Precautions: Fall Restrictions Weight Bearing Restrictions: No      Mobility  Bed Mobility Overal bed mobility: Needs Assistance Bed Mobility: Supine to Sit, Sit  to Supine     Supine to sit: Min assist Sit to supine: Min assist   General bed mobility comments: Assist for LEs throughout    Transfers Overall transfer level: Needs assistance Equipment used: 1 person hand held assist Transfers: Sit to/from Stand Sit to Stand: Min assist           General transfer comment: Min A for lift assist and steadying. Increased time required and pt very tearful    Ambulation/Gait Ambulation/Gait assistance: Min assist Gait Distance (Feet): 4 Feet Assistive device: 1 person hand held assist Gait Pattern/deviations: Step-to pattern, Antalgic Gait velocity: Decreased     General Gait Details: Pt with very poor tolerance secondary to increased pain. Min A for steadying.  Stairs            Wheelchair Mobility    Modified Rankin (Stroke Patients Only)       Balance Overall balance assessment: History of Falls, Needs assistance Sitting-balance support: Feet supported Sitting balance-Leahy Scale: Fair     Standing balance support: During functional activity Standing balance-Leahy Scale: Poor Standing balance comment: Reliant on UE support                             Pertinent Vitals/Pain Pain Assessment Pain Assessment: Faces Faces Pain Scale: Hurts whole lot Pain Location: B legs Pain Descriptors / Indicators: Constant, Throbbing, Tightness, Discomfort Pain Intervention(s): Limited activity within patient's tolerance, Monitored during session, Repositioned    Home Living Family/patient expects to be discharged to:: Private residence Living Arrangements: Alone   Type of Home: Apartment Home Access: Stairs to enter Entrance Stairs-Rails: None Entrance Stairs-Number of Steps: 2   Home Layout: One level Home Equipment: [employer] -  single point      Prior Function Prior Level of Function : Independent/Modified Independent                     Hand Dominance        Extremity/Trunk Assessment   Upper  Extremity Assessment Upper Extremity Assessment: Defer to OT evaluation    Lower Extremity Assessment Lower Extremity Assessment: LLE deficits/detail;RLE deficits/detail RLE Deficits / Details: significant swelling and redness. LLE Deficits / Details: significant swelling and redness. blister noted on foot    Cervical / Trunk Assessment Cervical / Trunk Assessment: Normal  Communication   Communication: No difficulties  Cognition Arousal/Alertness: Awake/alert Behavior During Therapy: Flat affect Overall Cognitive Status: No family/caregiver present to determine baseline cognitive functioning                                 General Comments: Slow processing noted.        General Comments      Exercises     Assessment/Plan    PT Assessment Patient needs continued PT services  PT Problem List Decreased strength;Decreased activity tolerance;Decreased balance;Decreased mobility;Pain       PT Treatment Interventions DME instruction;Gait training;Therapeutic activities;Therapeutic exercise;Patient/family education;Balance training;Functional mobility training    PT Goals (Current goals can be found in the Care Plan section)  Acute Rehab PT Goals Patient Stated Goal: to decrease pain PT Goal Formulation: With patient Time For Goal Achievement: 01/04/22 Potential to Achieve Goals: Good    Frequency Min 3X/week     Co-evaluation               AM-PAC PT "6 Clicks" Mobility  Outcome Measure Help needed turning from your back to your side while in a flat bed without using bedrails?: A Little Help needed moving from lying on your back to sitting on the side of a flat bed without using bedrails?: A Little Help needed moving to and from a bed to a chair (including a wheelchair)?: A Little Help needed standing up from a chair using your arms (e.g., wheelchair or bedside chair)?: A Little Help needed to walk in hospital room?: A Lot Help needed climbing  3-5 steps with a railing? : Total 6 Click Score: 15    End of Session Equipment Utilized During Treatment: Gait belt Activity Tolerance: Patient limited by pain Patient left: in bed;with call bell/phone within reach (on stretcher in ED) Nurse Communication: Mobility status PT Visit Diagnosis: History of falling (Z91.81);Difficulty in walking, not elsewhere classified (R26.2);Pain Pain - Right/Left:  (bilateral) Pain - part of body: Leg    Time: 3785-8850 PT Time Calculation (min) (ACUTE ONLY): 17 min   Charges:   PT Evaluation $PT Eval Moderate Complexity: 1 Mod          Reuel Derby, PT, DPT  Acute Rehabilitation Services  Office: [phone]   Rudean Hitt 12/21/2021, 12:53 PM

## 2021-12-21 NOTE — Assessment & Plan Note (Signed)
In setting of anasarca.  Request PT/OT eval.

## 2021-12-21 NOTE — Assessment & Plan Note (Signed)
Self caths.

## 2021-12-21 NOTE — H&P (Signed)
History and Physical    Anna Garcia FGH:[id number] DOB: [birth year] DOA: 12/20/2021  PCP: [name], MD  Patient coming from: Home  I have personally briefly reviewed patient's old medical records in [place]  Chief Complaint: Lower extremity swelling and pain  HPI: Anna Garcia is a 41 y.o. female with medical history significant for type 1 diabetes with gastroparesis, asthma, inadequate esophageal motility with GERD, chronic urinary retention who self caths, chronic macrocytic anemia, chronic pain syndrome/fibromyalgia, depression/anxiety, recent imaging compatible with liver cirrhosis who presented to the ED for evaluation of lower extremity edema.   Patient recently admitted 12/11/2021-12/18/2021 for DKA.  She was noted to have anasarca and persistent sinus tachycardia.  She was initially given IV Lasix.  TTE showed EF 60-65%, indeterminate LV diastolic parameters.  RUQ ultrasound showed changes indicative of liver cirrhosis with background of hepatic steatosis.  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  Work-up included negative antimitochondrial antibodies, anti-smooth muscle antibodies, RF, ANA, ceruloplasmin.  Her edema was felt related to hypoalbuminemia.  She became increasingly confused while in the hospital and required transient IVC which was discontinued after psychiatry evaluation.   Patient returned to the ED for evaluation of persistent lower extremity swelling and pain.  She has felt generally weak.  She is tearful due to no significant improvement despite recent hospitalizations.    ED Course  Labs/Imaging on admission: I have personally reviewed following labs and imaging studies.  Initial vitals showed BP 1 2/64, pulse 121, RR 14, temp 98.4 F, SPO2 100% on room air.   Labs show WBC 12.6, hemoglobin 9.3, platelets 219,000, sodium 133, potassium 3.4, bicarb 19, BUN 8, creatinine 1.23, serum glucose 238, AST 39, ALT 30, alk phos 191, total  bilirubin 1.6, BNP 47.7, troponin 30 > 34, i-STAT beta ECG <5.0.   Portable chest x-ray negative for focal consolidation, edema, effusion.   CTA chest and CT abdomen/pelvis with contrast negative for evidence of PE.  Liver cirrhosis with changes suggestive of fibrosis noted.  Prominent small bowel thickening and diffuse colon wall thickening and moderate volume of abdominopelvic ascites and generalized subcutaneous edema noted.  Patient was given IV Lasix 40 mg and oral potassium 40 mEq.  The hospitalist service was consulted for further medical recommendations.  Review of Systems: All systems reviewed and are negative except as documented in history of present illness above.   Past Medical History:  Diagnosis Date   Diabetes mellitus    Gastroparesis    Hypertension    Neuropathy     Past Surgical History:  Procedure Laterality Date   ESOPHAGOGASTRODUODENOSCOPY (EGD) WITH PROPOFOL N/A 09/24/2021   Procedure: ESOPHAGOGASTRODUODENOSCOPY (EGD) WITH PROPOFOL;  Surgeon: Clarene Essex, MD;  Location: [facility];  Service: Gastroenterology;  Laterality: N/A;   SAVORY DILATION N/A 09/24/2021   Procedure: SAVORY DILATION - 16 MM;  Surgeon: Clarene Essex, MD;  Location: [facility];  Service: Gastroenterology;  Laterality: N/A;    Social History:  reports that she has quit smoking. Her smoking use included cigarettes. She smoked an average of .5 packs per day. She has never used smokeless tobacco. She reports that she does not drink alcohol and does not use drugs.  No Known Allergies  Family History  Problem Relation Age of Onset   Hypertension Mother    Dementia Mother    Heart attack Father    Hypertension Father    Heart disease Father    Hypertension Brother    Stroke  Maternal Grandfather      Prior to Admission medications   Medication Sig Start Date End Date Taking? Authorizing Provider  BAQSIMI TWO PACK 3 MG/DOSE POWD Place 1 spray into both nostrils as needed (for a very low  blood sugar emergency, when unable to eat or drink and need help from someone else- or as otherwise directed). 10/30/21   [provider]  DEXILANT 60 MG capsule Take 1 capsule (60 mg total) by mouth daily. 09/26/21   Regalado, Belkys A, MD  dicyclomine (BENTYL) 10 MG capsule Take 1 capsule (10 mg total) by mouth 4 (four) times daily -  before meals and at bedtime. 12/11/21   Samuella Cota, MD  diphenhydrAMINE (BENADRYL ALLERGY) 25 mg capsule Take 1 capsule (25 mg total) by mouth every 6 (six) hours as needed. 12/18/21   Shelly Coss, MD  famotidine (PEPCID) 40 MG tablet Take 40 mg by mouth 2 (two) times daily. 05/28/19   [provider]  ferrous sulfate 325 (65 FE) MG tablet Take 1 tablet by mouth daily with breakfast. 12/19/21   Shelly Coss, MD  gabapentin (NEURONTIN) 300 MG capsule Take 1 capsule (300 mg total) by mouth 3 (three) times daily. 05/28/21   Eugenie Filler, MD  granisetron (KYTRIL) 1 MG tablet Take 1 mg by mouth every 12 (twelve) hours. 11/05/21   [provider]  hydrocortisone cream 0.5 % Apply 1 Application topically 2 (two) times daily. Apply on rash on hands 12/18/21   Shelly Coss, MD  insulin glargine (LANTUS SOLOSTAR) 100 UNIT/ML Solostar Pen Inject 8 Units into the skin 2 times daily. 12/18/21   Shelly Coss, MD  insulin glulisine (APIDRA SOLOSTAR) 100 UNIT/ML Solostar Pen Inject 0 - 8 Units into the skin daily AS NEEDED if blood glucose levels are trending upwards- changes as per carb intake 12/18/21   Shelly Coss, MD  lipase/protease/amylase (CREON) 36000 UNITS CPEP capsule Take 1 capsule (36,000 Units total) by mouth 3 (three) times daily with meals. 12/11/21   Samuella Cota, MD  loperamide (IMODIUM) 2 MG capsule Take 1 capsule (2 mg total) by mouth as needed for diarrhea or loose stools. 09/26/21   Regalado, Belkys A, MD  metoCLOPramide (REGLAN) 10 MG tablet Take 1 tablet by mouth every 6 hours as needed for nausea. 12/18/21 01/17/22   Shelly Coss, MD  mometasone-formoterol (DULERA) 100-5 MCG/ACT AERO Inhale 2 puffs into the lungs 2 (two) times daily. 07/07/20   Mercy Riding, MD  montelukast (SINGULAIR) 10 MG tablet Take 10 mg by mouth at bedtime. 05/24/19   [provider]  MOTEGRITY 1 MG TABS Take 1 mg by mouth daily. 08/31/21   [provider]  rosuvastatin (CRESTOR) 20 MG tablet Take 20 mg by mouth at bedtime. 05/24/19   [provider]  insulin aspart (NOVOLOG) 100 UNIT/ML injection Inject 3 units three times a day with meals if you eat over 50% of your meals. In addition, use sliding scale as follows: CBG 70 - 120: 0 units CBG 121 - 150: 2 units CBG 151 - 200: 3 units CBG 201 - 250: 5 units CBG 251 - 300: 8 units CBG 301 - 350: 11 units CBG 351 - 400: 15 units CBG > 400: call MD and obtain STAT lab verification Patient not taking: No sig reported 07/07/20 10/13/20  Mercy Riding, MD    Physical Exam: Vitals:   12/20/21 2015 12/20/21 2030 12/20/21 2045 12/20/21 2245  BP: 97/65 107/65 101/63 (!) 95/55  Pulse: (!) 117  (!) 120 (!) 116  Resp: 12 14 17 14   Temp:      TempSrc:      SpO2: 100%  100% 99%  Height:       Constitutional: Resting in bed with head elevated, NAD, calm, comfortable Eyes: EOMI, lids and conjunctivae normal ENMT: Mucous membranes are moist. Posterior pharynx clear of any exudate or lesions.Normal dentition.  Neck: normal, supple, no masses. Respiratory: clear to auscultation bilaterally, no wheezing, no crackles. Normal respiratory effort. No accessory muscle use.  Cardiovascular: Tachycardic with regular rhythm, no murmurs / rubs / gallops.  +1 bilateral lower extremity edema. 2+ pedal pulses. Abdomen: no tenderness, no masses palpated. Musculoskeletal: no clubbing / cyanosis. No joint deformity upper and lower extremities. Good ROM, no contractures. Normal muscle tone.  Skin: Superficial skin loss in circular area dorsum of right foot with mild surrounding erythema,  blister medial aspect left foot.  See pictures below. Neurologic:  Sensation intact. Strength 5/5 in all 4.  Psychiatric:  Alert and oriented x 3.        EKG: Personally reviewed. Sinus tachycardia, rate 118.  Similar to prior.  Assessment/Plan Principal Problem:   Cirrhosis of liver with ascites, unspecified hepatic cirrhosis type (HCC) Active Problems:   Hypotension   Cellulitis of foot, right   Type 1 diabetes mellitus (HCC)   Sinus tachycardia   Moderate persistent asthma   Macrocytic anemia   Generalized weakness   Alcohol use   Chronic urinary retention   Anna Garcia is a 41 y.o. female with medical history significant for type 1 diabetes with gastroparesis, asthma, inadequate esophageal motility with GERD, chronic urinary retention who self caths, chronic macrocytic anemia, chronic pain syndrome/fibromyalgia, depression/anxiety, recent imaging compatible with liver cirrhosis who is admitted with anasarca in setting of hepatic cirrhosis.  Assessment and Plan: * Cirrhosis of liver with ascites, unspecified hepatic cirrhosis type ([place]) Recent imaging consistent with liver cirrhosis morphology.  This is likely the cause of her anasarca.  She is mentating well without evidence of hepatic encephalopathy.  She reports regular bowel movements daily.  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 -Start on spironolactone 50 mg and Lasix 20 mg daily -Can increase spironolactone to 100 mg and Lasix to 40 mg based on response/tolerance -EGD 09/24/2021 without evidence of esophageal varices.  Hypotension Previously hypertensive, now hypotensive in setting of cirrhosis.  Recently started on midodrine 5 mg TID for BP support. -Increase midodrine to 10 mg TID with addition of Spiro/Lasix  Cellulitis of foot, right Question of mild early cellulitis of right foot.  WBC 12.6.  She has persistent sinus tachycardia which is more likely reflex tachycardia related to hypotension.  Sepsis not  ruled in on admission. -Continue IV ceftriaxone for now -Follow blood cultures -De-escalate antibiotics as able  Type 1 diabetes mellitus (HCC) Continue Semglee 8 units twice daily, add SSI.  Chronic urinary retention Self caths.  Alcohol use Patient reports increased alcohol use recently, several shots of liquor daily.  She denies any withdrawal symptoms.  I have advised her on complete cessation from alcohol given her liver cirrhosis findings.  Generalized weakness In setting of anasarca.  Request PT/OT eval.  Macrocytic anemia Hemoglobin stable at 9.3.  P01 and folic acid levels normal 3 months ago.  Moderate persistent asthma Stable without wheezing on admission.  Continue Dulera and Singulair.  Sinus tachycardia Persistent sinus tachycardia also noted on prior admission.  Likely reflex tachycardia secondary to hypotension.  CTA  negative for PE.  DVT prophylaxis: enoxaparin (LOVENOX) injection 40 mg Start: 12/21/21 1000 Code Status: Full code Family Communication: Discussed with patient, she has discussed with family Disposition Plan: From home and likely discharge to home pending clinical progress Consults called: None Severity of Illness: The appropriate patient status for this patient is OBSERVATION. Observation status is judged to be reasonable and necessary in order to provide the required intensity of service to ensure the patient's safety. The patient's presenting symptoms, physical exam findings, and initial radiographic and laboratory data in the context of their medical condition is felt to place them at decreased risk for further clinical deterioration. Furthermore, it is anticipated that the patient will be medically stable for discharge from the hospital within 2 midnights of admission.   Zada Finders MD [facility]  If 7PM-7AM, please contact night-coverage [email or web address]  12/21/2021, 1:25 AM

## 2021-12-21 NOTE — Progress Notes (Signed)
NEW ADMISSION NOTE New Admission Note:   Arrival Method: stretcher Mental Orientation: A&OX4 Telemetry: 5M04 Assessment: Completed Skin: intact blisters bilateral feet, stage 2 Right foot and Left foot, brushing bialteral arms  IV: LAC Pain: 8/10 Tubes: NONE Safety Measures: Safety Fall Prevention Plan has been given, discussed and signed Admission: Completed [facility] Orientation: Patient has been orientated to the room, unit and staff.  Family: Friend at bedside  Orders have been reviewed and implemented. Will continue to monitor the patient. Call light has been placed within reach and bed alarm has been activated.   Emersynn Deatley S Erykah Lippert, RN

## 2021-12-21 NOTE — Evaluation (Signed)
Occupational Therapy Evaluation Patient Details Name: Anna Garcia MRN: [id number] DOB: 1980-05-13 Today's Date: 12/21/2021   History of Present Illness Pt is a 41 y/o female admitted secondary to LE swelling, RLE cellulitis, and liver cirrhosis. PMH includes DM, alcohol abuse, fibromyalgia, chronic pain syndrome, HTN, chronic urinary retention with self caths.   Clinical Impression   Patient admitted for the diagnosis above.  PTA she lives at home, has food delivered, has fallen at home, and typically sponge bathes at baseline.  Patient states that her sister-in-law assist PRN for transportation, but she does not go out much.  Lower extremity pain and swelling are the deficits.  The patient would benefit from adaptive equipment training, and OT will follow in the acute setting.  No post acute OT is anticipated, but will depend on progress and resolution of pain.        Recommendations for follow up therapy are one component of a multi-disciplinary discharge planning process, led by the attending physician.  Recommendations may be updated based on patient status, additional functional criteria and insurance authorization.   Follow Up Recommendations  No OT follow up    Assistance Recommended at Discharge Set up Supervision/Assistance  Patient can return home with the following A little help with walking and/or transfers;A little help with bathing/dressing/bathroom;Assistance with cooking/housework;Assist for transportation    Functional Status Assessment  Patient has had a recent decline in their functional status and demonstrates the ability to make significant improvements in function in a reasonable and predictable amount of time.  Equipment Recommendations  BSC/3in1    Recommendations for Other Services       Precautions / Restrictions Precautions Precautions: Fall Restrictions Weight Bearing Restrictions: No Other Position/Activity Restrictions: weeping edema      Mobility  Bed Mobility   Bed Mobility: Sit to Supine       Sit to supine: Min assist        Transfers Overall transfer level: Needs assistance Equipment used: 1 person hand held assist Transfers: Sit to/from Stand Sit to Stand: Supervision     Step pivot transfers: Supervision            Balance Overall balance assessment: History of Falls, Needs assistance Sitting-balance support: Feet supported Sitting balance-Leahy Scale: Good     Standing balance support: Single extremity supported Standing balance-Leahy Scale: Fair                             ADL either performed or assessed with clinical judgement   ADL   Eating/Feeding: Independent;Sitting   Grooming: Wash/dry hands;Supervision/safety;Standing           Upper Body Dressing : Set up;Sitting   Lower Body Dressing: Moderate assistance;Sit to/from stand   Toilet Transfer: Supervision/safety;Ambulation;BSC/3in1                   Vision   Vision Assessment?: No apparent visual deficits     Perception Perception Perception: Within Functional Limits   Praxis Praxis Praxis: Intact    Pertinent Vitals/Pain Pain Assessment Faces Pain Scale: Hurts little more Pain Location: B legs Pain Descriptors / Indicators: Tender Pain Intervention(s): Monitored during session     Hand Dominance Right   Extremity/Trunk Assessment Upper Extremity Assessment Upper Extremity Assessment: Overall WFL for tasks assessed   Lower Extremity Assessment Lower Extremity Assessment: Defer to PT evaluation   Cervical / Trunk Assessment Cervical / Trunk Assessment: Normal   Communication Communication Communication:  No difficulties   Cognition Arousal/Alertness: Awake/alert Behavior During Therapy: Flat affect Overall Cognitive Status: No family/caregiver present to determine baseline cognitive functioning                               Problem Solving: Slow processing, Requires verbal  cues       General Comments   VSS    Exercises     Shoulder Instructions      Home Living Family/patient expects to be discharged to:: Private residence Living Arrangements: Alone Available Help at Discharge: Friend(s);Family;Available PRN/intermittently Type of Home: Apartment Home Access: Stairs to enter Entrance Stairs-Number of Steps: 2 Entrance Stairs-Rails: None Home Layout: One level     Bathroom Shower/Tub: Teacher, early years/pre: Standard Bathroom Accessibility: No   Home Equipment: Cane - single point          Prior Functioning/Environment Prior Level of Function : Independent/Modified Independent               ADLs Comments: Pt having problems standing to cook for self, having food delivered.  Sponge bathes at baseline        OT Problem List: Decreased strength;Decreased activity tolerance;Impaired balance (sitting and/or standing);Decreased knowledge of use of DME or AE;Pain;Increased edema      OT Treatment/Interventions: Self-care/ADL training;Therapeutic activities;DME and/or AE instruction    OT Goals(Current goals can be found in the care plan section) Acute Rehab OT Goals Patient Stated Goal: Pain free OT Goal Formulation: With patient Time For Goal Achievement: 01/04/22 Potential to Achieve Goals: Good ADL Goals Pt Will Perform Grooming: with set-up;standing Pt Will Perform Lower Body Dressing: with min assist;sit to/from stand;with adaptive equipment Pt Will Transfer to Toilet: with modified independence;ambulating;regular height toilet  OT Frequency: Min 2X/week    Co-evaluation              AM-PAC OT "6 Clicks" Daily Activity     Outcome Measure Help from another person eating meals?: None Help from another person taking care of personal grooming?: None Help from another person toileting, which includes using toliet, bedpan, or urinal?: A Little Help from another person bathing (including washing, rinsing,  drying)?: A Lot Help from another person to put on and taking off regular upper body clothing?: None Help from another person to put on and taking off regular lower body clothing?: A Lot 6 Click Score: 19   End of Session Nurse Communication: Mobility status  Activity Tolerance: Patient limited by pain Patient left: in bed;with call bell/phone within reach  OT Visit Diagnosis: Unsteadiness on feet (R26.81);History of falling (Z91.81);Pain Pain - part of body: Leg                Time: 1400-1419 OT Time Calculation (min): 19 min Charges:  OT General Charges $OT Visit: 1 Visit OT Evaluation $OT Eval Moderate Complexity: 1 Mod  12/21/2021  RP, OTR/L  Acute Rehabilitation Services  Office:  [phone]   Metta Clines 12/21/2021, 2:35 PM

## 2021-12-22 DIAGNOSIS — R7881 Bacteremia: Secondary | ICD-10-CM

## 2021-12-22 DIAGNOSIS — K7460 Unspecified cirrhosis of liver: Secondary | ICD-10-CM | POA: Diagnosis not present

## 2021-12-22 DIAGNOSIS — R531 Weakness: Secondary | ICD-10-CM | POA: Diagnosis not present

## 2021-12-22 DIAGNOSIS — R601 Generalized edema: Secondary | ICD-10-CM | POA: Diagnosis not present

## 2021-12-22 DIAGNOSIS — B955 Unspecified streptococcus as the cause of diseases classified elsewhere: Secondary | ICD-10-CM

## 2021-12-22 DIAGNOSIS — K652 Spontaneous bacterial peritonitis: Secondary | ICD-10-CM | POA: Diagnosis not present

## 2021-12-22 DIAGNOSIS — R188 Other ascites: Secondary | ICD-10-CM | POA: Diagnosis not present

## 2021-12-22 DIAGNOSIS — L03115 Cellulitis of right lower limb: Secondary | ICD-10-CM | POA: Diagnosis not present

## 2021-12-22 DIAGNOSIS — Z789 Other specified health status: Secondary | ICD-10-CM | POA: Diagnosis not present

## 2021-12-22 LAB — BLOOD CULTURE ID PANEL (REFLEXED) - BCID2

## 2021-12-22 LAB — CBC
HCT: 28.3 % — ABNORMAL LOW (ref 36.0–46.0)
Hemoglobin: 9.2 g/dL — ABNORMAL LOW (ref 12.0–15.0)
MCH: 34.8 pg — ABNORMAL HIGH (ref 26.0–34.0)
MCHC: 32.5 g/dL (ref 30.0–36.0)
MCV: 107.2 fL — ABNORMAL HIGH (ref 80.0–100.0)
Platelets: 222 10*3/uL (ref 150–400)
RBC: 2.64 MIL/uL — ABNORMAL LOW (ref 3.87–5.11)
RDW: 18 % — ABNORMAL HIGH (ref 11.5–15.5)
WBC: 10.9 10*3/uL — ABNORMAL HIGH (ref 4.0–10.5)
nRBC: 0 % (ref 0.0–0.2)

## 2021-12-22 LAB — COMPREHENSIVE METABOLIC PANEL
ALT: 25 U/L (ref 0–44)
AST: 35 U/L (ref 15–41)
Albumin: 2.8 g/dL — ABNORMAL LOW (ref 3.5–5.0)
Alkaline Phosphatase: 169 U/L — ABNORMAL HIGH (ref 38–126)
Anion gap: 12 (ref 5–15)
BUN: 9 mg/dL (ref 6–20)
CO2: 20 mmol/L — ABNORMAL LOW (ref 22–32)
Calcium: 8.8 mg/dL — ABNORMAL LOW (ref 8.9–10.3)
Chloride: 100 mmol/L (ref 98–111)
Creatinine, Ser: 1.29 mg/dL — ABNORMAL HIGH (ref 0.44–1.00)
GFR, Estimated: 54 mL/min — ABNORMAL LOW (ref >60)
Glucose, Bld: 176 mg/dL — ABNORMAL HIGH (ref 70–99)
Potassium: 3.4 mmol/L — ABNORMAL LOW (ref 3.5–5.1)
Sodium: 132 mmol/L — ABNORMAL LOW (ref 135–145)
Total Bilirubin: 1.7 mg/dL — ABNORMAL HIGH (ref 0.3–1.2)
Total Protein: 6.2 g/dL — ABNORMAL LOW (ref 6.5–8.1)

## 2021-12-22 LAB — IRON AND TIBC
Iron: 35 ug/dL (ref 28–170)
Saturation Ratios: 29 % (ref 10.4–31.8)
TIBC: 120 ug/dL — ABNORMAL LOW (ref 250–450)
UIBC: 85 ug/dL

## 2021-12-22 LAB — GLUCOSE, CAPILLARY
Glucose-Capillary: 164 mg/dL — ABNORMAL HIGH (ref 70–99)
Glucose-Capillary: 134 mg/dL — ABNORMAL HIGH (ref 70–99)
Glucose-Capillary: 200 mg/dL — ABNORMAL HIGH (ref 70–99)
Glucose-Capillary: 82 mg/dL (ref 70–99)

## 2021-12-22 LAB — FERRITIN: Ferritin: 110 ng/mL (ref 11–307)

## 2021-12-22 MED ORDER — OXYCODONE HCL 5 MG PO TABS
7.5000 mg | ORAL_TABLET | ORAL | Status: DC | PRN
  Administered 2021-12-22 – 2021-12-23 (×3): 7.5 mg via ORAL
  Filled 2021-12-22 (×5): qty 2

## 2021-12-22 MED ORDER — SPIRONOLACTONE 25 MG PO TABS
100.0000 mg | ORAL_TABLET | Freq: Every day | ORAL | Status: DC
  Administered 2021-12-22 – 2021-12-30 (×8): 100 mg via ORAL
  Filled 2021-12-22 (×9): qty 4

## 2021-12-22 MED ORDER — TRIAMCINOLONE ACETONIDE 0.025 % EX CREA
TOPICAL_CREAM | Freq: Two times a day (BID) | CUTANEOUS | Status: DC
  Administered 2021-12-27: 1 via TOPICAL
  Filled 2021-12-22 (×3): qty 15

## 2021-12-22 MED ORDER — NALOXONE HCL 0.4 MG/ML IJ SOLN
0.4000 mg | INTRAMUSCULAR | Status: DC | PRN
Start: 2021-12-22 — End: 2022-01-04

## 2021-12-22 MED ORDER — FENTANYL CITRATE PF 50 MCG/ML IJ SOSY
12.5000 ug | PREFILLED_SYRINGE | Freq: Once | INTRAMUSCULAR | Status: AC | PRN
  Administered 2021-12-22: 12.5 ug via INTRAVENOUS
  Filled 2021-12-22: qty 1

## 2021-12-22 MED ORDER — SODIUM CHLORIDE 0.9 % IV SOLN
2.0000 g | INTRAVENOUS | Status: AC
  Administered 2021-12-22 – 2021-12-28 (×7): 2 g via INTRAVENOUS
  Filled 2021-12-22 (×6): qty 20

## 2021-12-22 MED ORDER — FUROSEMIDE 40 MG PO TABS
40.0000 mg | ORAL_TABLET | Freq: Every day | ORAL | Status: DC
  Administered 2021-12-22 – 2021-12-24 (×3): 40 mg via ORAL
  Filled 2021-12-22 (×4): qty 1

## 2021-12-22 MED ORDER — POTASSIUM CHLORIDE CRYS ER 20 MEQ PO TBCR
40.0000 meq | EXTENDED_RELEASE_TABLET | Freq: Once | ORAL | Status: AC
  Administered 2021-12-22: 40 meq via ORAL
  Filled 2021-12-22: qty 2

## 2021-12-22 NOTE — Consult Note (Addendum)
Referring Provider: Dr. Sloan Leiter, [facility] Primary Care Physician:  Day, [name], MD Primary Gastroenterologist:  Althia Forts, follows with GI at [facility]  Reason for Consultation:  ? SBP  HPI: Anna Garcia is a 41 y.o. female with past medical history of DM-1, gastroparesis, chronic pain syndrome/fibromyalgia, HTN, chronic urinary retention-self caths at home-who was just discharged from this facility on 8/29 after being treated for DKA/AKI/volume overload.  She presented back to the hospital on 8/31 with anasarca, 3+ pitting edema in the lower extremity up to the thighs.  Has a new diagnosis of cirrhosis.  Hep B and C studies negative from February of this year.  ANA, AMA, anti-smooth muscle antibody all negative last month.  Ceruloplasmin was low.  AST and ALT normal, alk phos 169, total bili 1.7.  White blood cell count initially elevated at 12.6, down to 10.9 K today.  Hemoglobin 9.2 g.  Platelets are normal.  BUN normal.  Creatinine 1.29.  Sodium 132, potassium 3.4.  CT abdomen and pelvis with contrast on 12/20/2021:  IMPRESSION: 1. No definite CT evidence for acute pulmonary embolus. Subsegmental atelectasis at the bases. 2. Liver cirrhosis with distorted appearance of the central parenchyma suggestive of fibrosis. When the patient is clinically stable and able to follow directions and hold their breath (preferably as an outpatient) further evaluation with dedicated abdominal MRI should be considered. 3. Prominent small bowel thickening and diffuse colon wall thickening. Differential considerations include enterocolitis versus nonspecific bowel edema secondary to edematous state or hypoproteinemia. 4. Moderate volume of abdominopelvic ascites. Generalized subcutaneous edema consistent with anasarca.   Had a 1 L paracentesis and fluid studies consistent with SBP.  Has been started on midodrine, albumin, Rocephin.  Blood cultures positive for strep.  She complains of generalized  abdominal pain and tenderness.  Severe lower extremity edema that causes pain and inability for her to move her legs well.  Says she cannot get her clothes and shoes on.  EGD 09/2021 by Dr. Watt Climes:  - Small hiatal hernia. - Benign-appearing esophageal stenosis. Dilated. - Normal stomach. Except minimal gastritis - Normal duodenal bulb, first portion of the duodenum, second portion of the duodenum and third portion of the duodenum. - The examination was otherwise normal   Past Medical History:  Diagnosis Date   Diabetes mellitus    Gastroparesis    Hypertension    Neuropathy     Past Surgical History:  Procedure Laterality Date   ESOPHAGOGASTRODUODENOSCOPY (EGD) WITH PROPOFOL N/A 09/24/2021   Procedure: ESOPHAGOGASTRODUODENOSCOPY (EGD) WITH PROPOFOL;  Surgeon: Clarene Essex, MD;  Location: [facility];  Service: Gastroenterology;  Laterality: N/A;   IR PARACENTESIS  12/21/2021   SAVORY DILATION N/A 09/24/2021   Procedure: SAVORY DILATION - 16 MM;  Surgeon: Clarene Essex, MD;  Location: [facility];  Service: Gastroenterology;  Laterality: N/A;    Prior to Admission medications   Medication Sig Start Date End Date Taking? Authorizing Provider  DEXILANT 60 MG capsule Take 1 capsule (60 mg total) by mouth daily. 09/26/21  Yes Regalado, Belkys A, MD  dicyclomine (BENTYL) 10 MG capsule Take 1 capsule (10 mg total) by mouth 4 (four) times daily -  before meals and at bedtime. 12/11/21  Yes Samuella Cota, MD  famotidine (PEPCID) 40 MG tablet Take 40 mg by mouth 2 (two) times daily. 05/28/19  Yes [provider]  ferrous sulfate 325 (65 FE) MG tablet Take 1 tablet by mouth daily with breakfast. 12/19/21  Yes Shelly Coss, MD  gabapentin (  NEURONTIN) 300 MG capsule Take 1 capsule (300 mg total) by mouth 3 (three) times daily. 05/28/21  Yes Eugenie Filler, MD  granisetron (KYTRIL) 1 MG tablet Take 1 mg by mouth every 12 (twelve) hours. 11/05/21  Yes [provider]  insulin  glargine (LANTUS SOLOSTAR) 100 UNIT/ML Solostar Pen Inject 8 Units into the skin 2 times daily. 12/18/21  Yes Shelly Coss, MD  insulin glulisine (APIDRA SOLOSTAR) 100 UNIT/ML Solostar Pen Inject 0 - 8 Units into the skin daily AS NEEDED if blood glucose levels are trending upwards- changes as per carb intake Patient taking differently: Inject 0-8 Units into the skin daily as needed (for glucose levels are trending upward- changes carbs intake). 12/18/21  Yes Shelly Coss, MD  lipase/protease/amylase (CREON) 36000 UNITS CPEP capsule Take 1 capsule (36,000 Units total) by mouth 3 (three) times daily with meals. 12/11/21  Yes Samuella Cota, MD  loperamide (IMODIUM) 2 MG capsule Take 1 capsule (2 mg total) by mouth as needed for diarrhea or loose stools. Patient taking differently: Take 2 mg by mouth daily as needed for diarrhea or loose stools. 09/26/21  Yes Regalado, Belkys A, MD  metoCLOPramide (REGLAN) 10 MG tablet Take 1 tablet by mouth every 6 hours as needed for nausea. 12/18/21 01/17/22 Yes Adhikari, Tamsen Meek, MD  mometasone-formoterol (DULERA) 100-5 MCG/ACT AERO Inhale 2 puffs into the lungs 2 (two) times daily. 07/07/20  Yes Mercy Riding, MD  montelukast (SINGULAIR) 10 MG tablet Take 10 mg by mouth at bedtime. 05/24/19  Yes [provider]  MOTEGRITY 1 MG TABS Take 1 mg by mouth daily. 08/31/21  Yes [provider]  rosuvastatin (CRESTOR) 20 MG tablet Take 20 mg by mouth at bedtime. 05/24/19  Yes [provider]  BAQSIMI TWO PACK 3 MG/DOSE POWD Place 1 spray into both nostrils as needed (for a very low blood sugar emergency, when unable to eat or drink and need help from someone else- or as otherwise directed). 10/30/21   [provider]  diphenhydrAMINE (BENADRYL ALLERGY) 25 mg capsule Take 1 capsule (25 mg total) by mouth every 6 (six) hours as needed. Patient not taking: Reported on 12/21/2021 12/18/21   Shelly Coss, MD  hydrocortisone cream 0.5 % Apply 1  Application topically 2 (two) times daily. Apply on rash on hands Patient not taking: Reported on 12/21/2021 12/18/21   Shelly Coss, MD  insulin aspart (NOVOLOG) 100 UNIT/ML injection Inject 3 units three times a day with meals if you eat over 50% of your meals. In addition, use sliding scale as follows: CBG 70 - 120: 0 units CBG 121 - 150: 2 units CBG 151 - 200: 3 units CBG 201 - 250: 5 units CBG 251 - 300: 8 units CBG 301 - 350: 11 units CBG 351 - 400: 15 units CBG > 400: call MD and obtain STAT lab verification Patient not taking: No sig reported 07/07/20 10/13/20  Mercy Riding, MD    Current Facility-Administered Medications  Medication Dose Route Frequency Provider Last Rate Last Admin   acetaminophen (TYLENOL) tablet 650 mg  650 mg Oral Q6H PRN Ghimire, Henreitta Leber, MD       albumin human 25 % solution 25 g  25 g Intravenous TID Jonetta Osgood, MD 60 mL/hr at 12/22/21 1017 25 g at 12/22/21 1017   albuterol (PROVENTIL) (2.5 MG/3ML) 0.083% nebulizer solution 2.5 mg  2.5 mg Nebulization Q4H PRN Jonetta Osgood, MD       cefTRIAXone (ROCEPHIN)  2 g in sodium chloride 0.9 % 100 mL IVPB  2 g Intravenous Q24H Shela Leff, MD 200 mL/hr at 12/22/21 0827 2 g at 12/22/21 0827   dicyclomine (BENTYL) capsule 10 mg  10 mg Oral TID AC & HS Zada Finders R, MD   10 mg at 12/22/21 1207   enoxaparin (LOVENOX) injection 40 mg  40 mg Subcutaneous Q24H Zada Finders R, MD   40 mg at 12/22/21 0837   famotidine (PEPCID) tablet 40 mg  40 mg Oral BID Lenore Cordia, MD   40 mg at 12/22/21 0836   furosemide (LASIX) tablet 40 mg  40 mg Oral Daily Jonetta Osgood, MD   40 mg at 12/22/21 0836   gabapentin (NEURONTIN) capsule 300 mg  300 mg Oral TID Jonetta Osgood, MD   300 mg at 12/22/21 0836   insulin aspart (novoLOG) injection 0-5 Units  0-5 Units Subcutaneous QHS Lenore Cordia, MD   2 Units at 12/21/21 0153   insulin aspart (novoLOG) injection 0-9 Units  0-9 Units Subcutaneous TID WC Lenore Cordia, MD   1 Units at 12/22/21 1207   insulin glargine-yfgn (SEMGLEE) injection 12 Units  12 Units Subcutaneous BID Jonetta Osgood, MD   12 Units at 12/22/21 0933   lipase/protease/amylase (CREON) capsule 36,000 Units  36,000 Units Oral TID WC Jonetta Osgood, MD   36,000 Units at 12/22/21 1207   loperamide (IMODIUM) capsule 2 mg  2 mg Oral Daily PRN Jonetta Osgood, MD       metoCLOPramide (REGLAN) tablet 10 mg  10 mg Oral TID AC Zada Finders R, MD   10 mg at 12/22/21 1207   midodrine (PROAMATINE) tablet 10 mg  10 mg Oral TID WC Zada Finders R, MD   10 mg at 12/22/21 1207   mometasone-formoterol (DULERA) 100-5 MCG/ACT inhaler 2 puff  2 puff Inhalation BID Lenore Cordia, MD       montelukast (SINGULAIR) tablet 10 mg  10 mg Oral QHS Zada Finders R, MD   10 mg at 12/21/21 2139   naloxone [facility]) injection 0.4 mg  0.4 mg Intravenous PRN Shela Leff, MD       oxyCODONE (Oxy IR/ROXICODONE) immediate release tablet 7.5 mg  7.5 mg Oral Q4H PRN Jonetta Osgood, MD       rosuvastatin (CRESTOR) tablet 20 mg  20 mg Oral QHS Jonetta Osgood, MD   20 mg at 12/21/21 2138   sodium chloride flush (NS) 0.9 % injection 3 mL  3 mL Intravenous Q12H Zada Finders R, MD   3 mL at 12/21/21 7253   spironolactone (ALDACTONE) tablet 100 mg  100 mg Oral Daily Jonetta Osgood, MD   100 mg at 12/22/21 0836    Allergies as of 12/20/2021   (No Known Allergies)    Family History  Problem Relation Age of Onset   Hypertension Mother    Dementia Mother    Heart attack Father    Hypertension Father    Heart disease Father    Hypertension Brother    Stroke Maternal Grandfather     Social History   Socioeconomic History   Marital status: Single    Spouse name: Not on file   Number of children: Not on file   Years of education: Not on file   Highest education level: Not on file  Occupational History   Not on file  Tobacco Use   Smoking status: Former  Packs/day: 0.50    Types:  Cigarettes   Smokeless tobacco: Never  Vaping Use   Vaping Use: Never used  Substance and Sexual Activity   Alcohol use: No   Drug use: No   Sexual activity: Not on file  Other Topics Concern   Not on file  Social History Narrative   Not on file   Social Determinants of Health   Financial Resource Strain: Not on file  Food Insecurity: Not on file  Transportation Needs: Not on file  Physical Activity: Not on file  Stress: Not on file  Social Connections: Not on file  Intimate Partner Violence: Not on file    Review of Systems: ROS is O/W negative except as mentioned in HPI.  Physical Exam: Vital signs in last 24 hours: Temp:  [98.1 F (36.7 C)-98.7 F (37.1 C)] 98.1 F (36.7 C) (09/02 0835) Pulse Rate:  [66-113] 110 (09/02 0835) Resp:  [16-19] 16 (09/02 0835) BP: (99-116)/(68-77) 109/77 (09/02 0835) SpO2:  [97 %-100 %] 100 % (09/02 0835) Weight:  [84.9 kg-86 kg] 84.9 kg (09/01 2217)   General:  Alert, chronically ill-appearing, pleasant and cooperative in NAD Head:  Normocephalic and atraumatic. Eyes:  Sclera clear, no icterus.  Conjunctiva pink. Ears:  Normal auditory acuity. Mouth:  No deformity or lesions.   Lungs:  Clear throughout to auscultation.   No wheezes, crackles, or rhonchi.  Heart:  Tachy, no M/R/G. Abdomen:  Soft with some distention from ascites fluid.  BS present.  Diffuse TTP. Msk:  Symmetrical without gross deformities. Pulses:  Normal pulses noted. Extremities:  Significant edema in B/L LEs up to her thighs with some erythema. Neurologic:  Alert and oriented x 4;  grossly normal neurologically. Skin:  Intact without significant lesions or rashes. Psych:  Alert and cooperative. Normal mood and affect.  Intake/Output from previous day: 09/01 0701 - 09/02 0700 In: 690 [P.O.:475; IV Piggyback:215] Out: 2250 [Urine:2250] Intake/Output this shift: Total I/O In: 460 [P.O.:460] Out: 1800 [Urine:1800]  Lab Results: Recent Labs     12/20/21 1626 12/20/21 1709 12/21/21 0400 12/22/21 0513  WBC 12.6*  --  13.5* 10.9*  HGB 9.3* 10.9* 9.1* 9.2*  HCT 27.8* 32.0* 27.6* 28.3*  PLT 219  --  222 222   BMET Recent Labs    12/20/21 1626 12/20/21 1709 12/21/21 0400 12/22/21 0513  NA 133* 132* 131* 132*  K 3.4* 3.3* 3.6 3.4*  CL 100 100 101 100  CO2 19*  --  20* 20*  GLUCOSE 238* 229* 185* 176*  BUN 8 7 8 9   CREATININE 1.23* 1.00 1.22* 1.29*  CALCIUM 8.6*  --  8.4* 8.8*   LFT Recent Labs    12/22/21 0513  PROT 6.2*  ALBUMIN 2.8*  AST 35  ALT 25  ALKPHOS 169*  BILITOT 1.7*   Studies/Results: IR Paracentesis  Result Date: 12/21/2021 INDICATION: Patient with history of cirrhosis, previous ultrasound showed ascites. Request for therapeutic and diagnostic paracentesis up to 1 L. EXAM: ULTRASOUND GUIDED DIAGNOSTIC AND THERAPEUTIC PARACENTESIS MEDICATIONS: 10 mL 1% lidocaine COMPLICATIONS: None immediate. PROCEDURE: Informed written consent was obtained from the patient after a discussion of the risks, benefits and alternatives to treatment. A timeout was performed prior to the initiation of the procedure. Initial ultrasound scanning demonstrates a small amount of ascites within the right lower abdominal quadrant. The right lower abdomen was prepped and draped in the usual sterile fashion. 1% lidocaine was used for local anesthesia. Following this, a 19 gauge, 7-cm, Teressa Lower  catheter was introduced. An ultrasound image was saved for documentation purposes. The paracentesis was performed. The catheter was removed and a dressing was applied. The patient tolerated the procedure well without immediate post procedural complication. FINDINGS: A total of approximately 1 L of hazy yellow fluid was removed. Samples were sent to the laboratory as requested by the clinical team. IMPRESSION: Successful ultrasound-guided paracentesis yielding 1 L of peritoneal fluid. Read by: Durenda Guthrie, PA-C PLAN: If the patient eventually requires >/=2  paracenteses in a 30 day period, candidacy for formal evaluation by the [facility] will be assessed. Michaelle Birks, MD [facility] Electronically Signed   By: Michaelle Birks M.D.   On: 12/21/2021 15:39   US Abdomen Limited  Result Date: 12/21/2021 CLINICAL DATA:  Ascites EXAM: LIMITED ABDOMEN ULTRASOUND FOR ASCITES TECHNIQUE: Limited ultrasound survey for ascites was performed in all four abdominal quadrants. COMPARISON:  CT abdomen and pelvis 12/21/2018. FINDINGS: Moderate volume ascites demonstrated within all 4 quadrants. IMPRESSION: Moderate volume abdominal ascites. Electronically Signed   By: Lovey Newcomer M.D.   On: 12/21/2021 11:59   CT Abdomen Pelvis W Contrast  Result Date: 12/20/2021 CLINICAL DATA:  Chest pain abdominal pain EXAM: CT ANGIOGRAPHY CHEST CT ABDOMEN AND PELVIS WITH CONTRAST TECHNIQUE: Multidetector CT imaging of the chest was performed using the standard protocol during bolus administration of intravenous contrast. Multiplanar CT image reconstructions and MIPs were obtained to evaluate the vascular anatomy. Multidetector CT imaging of the abdomen and pelvis was performed using the standard protocol during bolus administration of intravenous contrast. RADIATION DOSE REDUCTION: This exam was performed according to the departmental dose-optimization program which includes automated exposure control, adjustment of the mA and/or kV according to patient size and/or use of iterative reconstruction technique. CONTRAST:  167m OMNIPAQUE IOHEXOL 350 MG/ML SOLN COMPARISON:  12/06/2021, chest x-ray 12/20/2021, CT chest 04/11/2011 FINDINGS: CTA CHEST FINDINGS Cardiovascular: Satisfactory opacification of the pulmonary arteries to the segmental level. No evidence of pulmonary embolism. Normal heart size. No pericardial effusion. Nonaneurysmal aorta. No dissection is seen. Mediastinum/Nodes: No  enlarged mediastinal, hilar, or axillary lymph nodes. Thyroid gland, trachea, and esophagus demonstrate no significant findings. Lungs/Pleura: Subsegmental atelectasis in the bases. No acute airspace disease, pleural effusion or pneumothorax Musculoskeletal: No chest wall abnormality. No acute or significant osseous findings. Review of the MIP images confirms the above findings. CT ABDOMEN and PELVIS FINDINGS Hepatobiliary: Liver cirrhosis. Slightly distorted appearance of the central parenchyma. Prominent central liver enhancement. Contracted gallbladder without calcified stone. Pancreas: No inflammation. Suspect significant atrophy. Not much pancreatic tissue identified. Spleen: Normal in size without focal abnormality. Adrenals/Urinary Tract: Adrenal glands are unremarkable. Kidneys are normal, without renal calculi, focal lesion, or hydronephrosis. Bladder is unremarkable. Stomach/Bowel: Stomach is nonenlarged. No dilated small bowel. Multiple thickened small bowel loops most evident in the left upper quadrant involving the jejunum. There is diffuse colon wall thickening. Vascular/Lymphatic: Nonaneurysmal aorta. Mild atherosclerosis. Subcentimeter retroperitoneal lymph nodes. Upper intra hepatic IVC appears slightly narrowed. Reproductive: IUD in the uterus.  No adnexal mass. Other: Moderate ascites within the abdomen and pelvis. No free air. Generalized edema consistent with anasarca. Musculoskeletal: No acute osseous abnormality. Review of the MIP images confirms the above findings. IMPRESSION: 1. No definite CT evidence for acute pulmonary embolus. Subsegmental atelectasis at the bases. 2. Liver cirrhosis with distorted appearance of the central parenchyma suggestive of fibrosis. When the patient is clinically stable and able to follow directions and hold their breath (preferably  as an outpatient) further evaluation with dedicated abdominal MRI should be considered. 3. Prominent small bowel thickening and  diffuse colon wall thickening. Differential considerations include enterocolitis versus nonspecific bowel edema secondary to edematous state or hypoproteinemia. 4. Moderate volume of abdominopelvic ascites. Generalized subcutaneous edema consistent with anasarca. Electronically Signed   By: Donavan Foil M.D.   On: 12/20/2021 19:46   CT Angio Chest PE W/Cm &/Or Wo Cm  Result Date: 12/20/2021 CLINICAL DATA:  Chest pain abdominal pain EXAM: CT ANGIOGRAPHY CHEST CT ABDOMEN AND PELVIS WITH CONTRAST TECHNIQUE: Multidetector CT imaging of the chest was performed using the standard protocol during bolus administration of intravenous contrast. Multiplanar CT image reconstructions and MIPs were obtained to evaluate the vascular anatomy. Multidetector CT imaging of the abdomen and pelvis was performed using the standard protocol during bolus administration of intravenous contrast. RADIATION DOSE REDUCTION: This exam was performed according to the departmental dose-optimization program which includes automated exposure control, adjustment of the mA and/or kV according to patient size and/or use of iterative reconstruction technique. CONTRAST:  1101m OMNIPAQUE IOHEXOL 350 MG/ML SOLN COMPARISON:  12/06/2021, chest x-ray 12/20/2021, CT chest 04/11/2011 FINDINGS: CTA CHEST FINDINGS Cardiovascular: Satisfactory opacification of the pulmonary arteries to the segmental level. No evidence of pulmonary embolism. Normal heart size. No pericardial effusion. Nonaneurysmal aorta. No dissection is seen. Mediastinum/Nodes: No enlarged mediastinal, hilar, or axillary lymph nodes. Thyroid gland, trachea, and esophagus demonstrate no significant findings. Lungs/Pleura: Subsegmental atelectasis in the bases. No acute airspace disease, pleural effusion or pneumothorax Musculoskeletal: No chest wall abnormality. No acute or significant osseous findings. Review of the MIP images confirms the above findings. CT ABDOMEN and PELVIS FINDINGS  Hepatobiliary: Liver cirrhosis. Slightly distorted appearance of the central parenchyma. Prominent central liver enhancement. Contracted gallbladder without calcified stone. Pancreas: No inflammation. Suspect significant atrophy. Not much pancreatic tissue identified. Spleen: Normal in size without focal abnormality. Adrenals/Urinary Tract: Adrenal glands are unremarkable. Kidneys are normal, without renal calculi, focal lesion, or hydronephrosis. Bladder is unremarkable. Stomach/Bowel: Stomach is nonenlarged. No dilated small bowel. Multiple thickened small bowel loops most evident in the left upper quadrant involving the jejunum. There is diffuse colon wall thickening. Vascular/Lymphatic: Nonaneurysmal aorta. Mild atherosclerosis. Subcentimeter retroperitoneal lymph nodes. Upper intra hepatic IVC appears slightly narrowed. Reproductive: IUD in the uterus.  No adnexal mass. Other: Moderate ascites within the abdomen and pelvis. No free air. Generalized edema consistent with anasarca. Musculoskeletal: No acute osseous abnormality. Review of the MIP images confirms the above findings. IMPRESSION: 1. No definite CT evidence for acute pulmonary embolus. Subsegmental atelectasis at the bases. 2. Liver cirrhosis with distorted appearance of the central parenchyma suggestive of fibrosis. When the patient is clinically stable and able to follow directions and hold their breath (preferably as an outpatient) further evaluation with dedicated abdominal MRI should be considered. 3. Prominent small bowel thickening and diffuse colon wall thickening. Differential considerations include enterocolitis versus nonspecific bowel edema secondary to edematous state or hypoproteinemia. 4. Moderate volume of abdominopelvic ascites. Generalized subcutaneous edema consistent with anasarca. Electronically Signed   By: KDonavan FoilM.D.   On: 12/20/2021 19:46   DG Chest Port 1 View  Result Date: 12/20/2021 CLINICAL DATA:  Shortness of  breath. EXAM: PORTABLE CHEST 1 VIEW COMPARISON:  Chest x-ray 12/04/2021 FINDINGS: The heart size and mediastinal contours are within normal limits. Both lungs are clear. The visualized skeletal structures are unremarkable. IMPRESSION: No active disease. Electronically Signed   By: ARonney AstersM.D.   On: 12/20/2021 17:21  IMPRESSION:  *Cirrhosis, new diagnosis:  Now with anasarca with LE edema and ascites.  Hepatitis B and hepatitis C serology negative from 2/23.  ANA, AMA, ASMA negative in August.  Ceruloplasmin low.   *SBP:  Seen on her tap this admission.  Had 1 Liter of fluid removed 9/1.  Receiving albumin, Rocephin, midodrine.   *Strep bacteremia:  Seen on her blood cultures. *IDDM with severe gastroparesis.  Recent DKA with admission.  Follows with GI at [facility] went there because of her gastroparesis. *Chronic pain syndrome and fibromyalgia  PLAN: -Will check iron studies, AFP, alpha-1 antitrypsin, total IgG to complete serologic work-up.  We will do a 24-hour urine collection for copper with a low ceruloplasmin. -2 g sodium diet.  Cannot diuresis until her blood pressure is improved.  Laban Emperor. Zehr  12/22/2021, 2:03 PM  I have taken a history, reviewed the chart and examined the patient. I performed a substantive portion of this encounter, including complete performance of at least one of the key components, in conjunction with the APP. I agree with the APP's note, impression and recommendations  41 year old female with type 1 diabetes, gastroparesis, chronic pain syndrome/fibromyalgia, with recently diagnosed cirrhosis, now with new ascites meeting criteria for SBP, also growing Streptococcus in her blood.  Infectious disease following, recommending repeat cultures and plans for TEE if repeat cultures positive.  Patient already receiving appropriate care for SBP with albumin, Rocephin and midodrine.  Renal function stable thus far.  She will need SBP prophylaxis  indefinitely as an outpatient (either ciprofloxacin or Septra.  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  24-hour urinary copper study ordered. Patient is aware of necessity for low sodium diet to control lower extremity edema and ascites. Okay with resuming low-dose diuretics, monitoring BP closely  Sylwia Cuervo E. Candis Schatz, MD [facility] Gastroenterology

## 2021-12-22 NOTE — Consult Note (Signed)
[facility]    Date of Admission:  12/20/2021     Reason for Consult: bacteremia    Referring Provider: Sloan Leiter     Abx: 8/31-c ceftriaxone        Assessment: 41 yo female with dm1 with gastroparesis, obesity, 2 months of legs swelling/pain/stable erythema, recent dx of cirrhosis, recent admission with dka, readmitted within a few days of discharge with failure to thrive/weakness found to have sbp and currently 1 of 2 set admitting bcx strep species and a few days few blisters on legs  She has diagnostic paracentesis on 9/01 and met cell count criteria for sbp. Cx is in progress still  9/01 bcx 1 of 2 set strep species. I do not know the significance of this yet. If the other set grew as well the same species, would likely need to w/u for VGS endocarditis. I will repeat bcx today  She has recent tte on 8/26 which is poor quality. Again if 9/1 bcx both sets positive, likely will go straight to tee  The bilateral lower edema/redness/pain is chronic unchanged and I suspect venous stasis dermatitis rather than cellulitis. There is some blister with clear fluid seen and it could be related to edema.    Plan: Continue ceftriaxone; for sbp 7 days.  F/u 9/1 bcx final report Repeat bcx today Discussed with primary team   I spent 75 minute reviewing data/chart, and coordinating care and >50% direct face to face time providing counseling/discussing diagnostics/treatment plan with patient    ------------------------------------------------ Principal Problem:   Cirrhosis of liver with ascites, unspecified hepatic cirrhosis type ([place]) Active Problems:   Type 1 diabetes mellitus ([place])   Sinus tachycardia   Moderate persistent asthma   Macrocytic anemia   Hypotension   Cellulitis of foot, right   Generalized weakness   Alcohol use   Chronic urinary retention   Anasarca    HPI: Anna Garcia is a 41 y.o. female dm1 with gastroparesis,  obesity, 2 months of legs swelling/pain/stable erythema, recent dx of cirrhosis, recent admission with dka, readmitted within a few days of discharge with failure to thrive/weakness found to have sbp and currently 1 of 2 set admitting bcx strep species and a few days few blisters on legs  Discussed with patient Chart reviewed  She had recent admission 8/22-29 for dka. During this found to have cirrhosis as well thought to be alcohol vs nash. Autoimmune screen labs negative.   She returned within 2 days of discharge complaining of fatigue, legs pain, weakness. She had bilateral LE edema for at least 2 months along with well demarcated redness and mild-mod ache which she said is unchanged  A few days prior to this admission ?new blister  Reports mild chronic abd pain as well  No midline back pain/other joint pain  No n/v/diarrhea  No cough  No headache/confusion  Afebrile; hds this admission Due to significant ascites had sbp tested and confirmed based on cell count; cx negative Bcx admission 1 of 2 set strep species Is on ceftriaxone for sbp  Reviewed previous tte no obvious valvular abnormality but not good quality      Family History  Problem Relation Age of Onset   Hypertension Mother    Dementia Mother    Heart attack Father    Hypertension Father    Heart disease Father    Hypertension Brother    Stroke Maternal Grandfather     Social  History   Tobacco Use   Smoking status: Former    Packs/day: 0.50    Types: Cigarettes   Smokeless tobacco: Never  Vaping Use   Vaping Use: Never used  Substance Use Topics   Alcohol use: No   Drug use: No    No Known Allergies  Review of Systems: ROS All Other ROS was negative, except mentioned above   Past Medical History:  Diagnosis Date   Diabetes mellitus    Gastroparesis    Hypertension    Neuropathy        Scheduled Meds:  dicyclomine  10 mg Oral TID AC & HS   enoxaparin (LOVENOX) injection  40 mg  Subcutaneous Q24H   famotidine  40 mg Oral BID   furosemide  40 mg Oral Daily   gabapentin  300 mg Oral TID   insulin aspart  0-5 Units Subcutaneous QHS   insulin aspart  0-9 Units Subcutaneous TID WC   insulin glargine-yfgn  12 Units Subcutaneous BID   lipase/protease/amylase  36,000 Units Oral TID WC   metoCLOPramide  10 mg Oral TID AC   midodrine  10 mg Oral TID WC   mometasone-formoterol  2 puff Inhalation BID   montelukast  10 mg Oral QHS   rosuvastatin  20 mg Oral QHS   sodium chloride flush  3 mL Intravenous Q12H   spironolactone  100 mg Oral Daily   Continuous Infusions:  albumin human 25 g (12/22/21 1017)   cefTRIAXone (ROCEPHIN)  IV 2 g (12/22/21 0827)   PRN Meds:.acetaminophen, albuterol, loperamide, naLOXone (NARCAN)  injection, oxyCODONE   OBJECTIVE: Blood pressure 109/77, pulse (!) 110, temperature 98.1 F (36.7 C), resp. rate 16, height 5' 4"  (1.626 m), weight 84.9 kg, SpO2 100 %.  Physical Exam  General/constitutional: no distress, pleasant, obese HEENT: Normocephalic, PER, Conj Clear, EOMI, Oropharynx clear Neck supple CV: rrr no mrg Lungs: clear to auscultation, normal respiratory effort Abd: Soft, Nontender Ext: 2+ edema bilateral LE to thigh Skin: well demarcated faint erythema in bilateral legs; slightly tender bilateral legs; no fluctuance; scattered clear blisters on feet; one large ruptured blister right foot Neuro: nonfocal MSK: no peripheral joint swelling/tenderness/warmth; back spines nontender     Lab Results Lab Results  Component Value Date   WBC 10.9 (H) 12/22/2021   HGB 9.2 (L) 12/22/2021   HCT 28.3 (L) 12/22/2021   MCV 107.2 (H) 12/22/2021   PLT 222 12/22/2021    Lab Results  Component Value Date   CREATININE 1.29 (H) 12/22/2021   BUN 9 12/22/2021   NA 132 (L) 12/22/2021   K 3.4 (L) 12/22/2021   CL 100 12/22/2021   CO2 20 (L) 12/22/2021    Lab Results  Component Value Date   ALT 25 12/22/2021   AST 35 12/22/2021   GGT  1,054 (H) 11/19/2021   ALKPHOS 169 (H) 12/22/2021   BILITOT 1.7 (H) 12/22/2021      Microbiology: Recent Results (from the past 240 hour(s))  Blood culture (routine x 2)     Status: None (Preliminary result)   Collection Time: 12/21/21  4:00 AM   Specimen: BLOOD  Result Value Ref Range Status   Specimen Description BLOOD SITE NOT SPECIFIED  Final   Special Requests   Final    BOTTLES DRAWN AEROBIC AND ANAEROBIC Blood Culture adequate volume   Culture  Setup Time   Final    GRAM POSITIVE COCCI IN CHAINS AEROBIC BOTTLE ONLY Organism ID to follow CRITICAL RESULT CALLED  TO, READ BACK BY AND VERIFIED WITH: T RUDISILL,[email or web address]  12/22/21 [place]    Culture   Final    NO GROWTH 1 DAY Performed at [facility], [place] [address] [place], [place] [address]    Report Status PENDING  Incomplete  Blood Culture ID Panel (Reflexed)     Status: Abnormal   Collection Time: 12/21/21  4:00 AM  Result Value Ref Range Status   Enterococcus faecalis NOT DETECTED NOT DETECTED Final   Enterococcus Faecium NOT DETECTED NOT DETECTED Final   Listeria monocytogenes NOT DETECTED NOT DETECTED Final   Staphylococcus species NOT DETECTED NOT DETECTED Final   Staphylococcus aureus (BCID) NOT DETECTED NOT DETECTED Final   Staphylococcus epidermidis NOT DETECTED NOT DETECTED Final   Staphylococcus lugdunensis NOT DETECTED NOT DETECTED Final   Streptococcus species DETECTED (A) NOT DETECTED Final    Comment: Not Enterococcus species, Streptococcus agalactiae, Streptococcus pyogenes, or Streptococcus pneumoniae. CRITICAL RESULT CALLED TO, READ BACK BY AND VERIFIED WITH: T RUDISILL,[email or web address]  12/22/21 [place]    Streptococcus agalactiae NOT DETECTED NOT DETECTED Final   Streptococcus pneumoniae NOT DETECTED NOT DETECTED Final   Streptococcus pyogenes NOT DETECTED NOT DETECTED Final   A.calcoaceticus-baumannii NOT DETECTED NOT DETECTED Final   Bacteroides fragilis NOT DETECTED NOT DETECTED Final    Enterobacterales NOT DETECTED NOT DETECTED Final   Enterobacter cloacae complex NOT DETECTED NOT DETECTED Final   Escherichia coli NOT DETECTED NOT DETECTED Final   Klebsiella aerogenes NOT DETECTED NOT DETECTED Final   Klebsiella oxytoca NOT DETECTED NOT DETECTED Final   Klebsiella pneumoniae NOT DETECTED NOT DETECTED Final   Proteus species NOT DETECTED NOT DETECTED Final   Salmonella species NOT DETECTED NOT DETECTED Final   Serratia marcescens NOT DETECTED NOT DETECTED Final   Haemophilus influenzae NOT DETECTED NOT DETECTED Final   Neisseria meningitidis NOT DETECTED NOT DETECTED Final   Pseudomonas aeruginosa NOT DETECTED NOT DETECTED Final   Stenotrophomonas maltophilia NOT DETECTED NOT DETECTED Final   Candida albicans NOT DETECTED NOT DETECTED Final   Candida auris NOT DETECTED NOT DETECTED Final   Candida glabrata NOT DETECTED NOT DETECTED Final   Candida krusei NOT DETECTED NOT DETECTED Final   Candida parapsilosis NOT DETECTED NOT DETECTED Final   Candida tropicalis NOT DETECTED NOT DETECTED Final   Cryptococcus neoformans/gattii NOT DETECTED NOT DETECTED Final    Comment: Performed at [facility], [address], [place], [place] [address]  Blood culture (routine x 2)     Status: None (Preliminary result)   Collection Time: 12/21/21  4:08 AM   Specimen: BLOOD  Result Value Ref Range Status   Specimen Description BLOOD SITE NOT SPECIFIED  Final   Special Requests   Final    BOTTLES DRAWN AEROBIC ONLY Blood Culture results may not be optimal due to an inadequate volume of blood received in culture bottles   Culture   Final    NO GROWTH 1 DAY Performed at [facility], [place] [address], [place],  [address]    Report Status PENDING  Incomplete  Culture, body fluid w Gram Stain-bottle     Status: None (Preliminary result)   Collection Time: 12/21/21  3:21 PM   Specimen: Fluid  Result Value Ref Range Status   Specimen Description FLUID PERITONEAL  Final    Special Requests BOTTLES DRAWN AEROBIC AND ANAEROBIC  Final   Culture   Final    NO GROWTH < 24 HOURS Performed at [facility], [place]  [address], [place], [place] [address]    Report Status PENDING  Incomplete  Gram stain     Status: None   Collection Time: 12/21/21  3:21 PM   Specimen: Fluid  Result Value Ref Range Status   Specimen Description FLUID PERITONEAL  Final   Special Requests NONE  Final   Gram Stain   Final    WBC PRESENT,BOTH PMN AND MONONUCLEAR NO ORGANISMS SEEN CYTOSPIN SMEAR Performed at [facility], [address], [place], [place] [address]    Report Status 12/21/2021 FINAL  Final     Serology:    Imaging: If present, new imagings (plain films, ct scans, and mri) have been personally visualized and interpreted; radiology reports have been reviewed. Decision making incorporated into the Impression / Recommendations.  8/31 abd pelv chest ct 1. No definite CT evidence for acute pulmonary embolus. Subsegmental atelectasis at the bases. 2. Liver cirrhosis with distorted appearance of the central parenchyma suggestive of fibrosis. When the patient is clinically stable and able to follow directions and hold their breath (preferably as an outpatient) further evaluation with dedicated abdominal MRI should be considered. 3. Prominent small bowel thickening and diffuse colon wall thickening. Differential considerations include enterocolitis versus nonspecific bowel edema secondary to edematous state or hypoproteinemia. 4. Moderate volume of abdominopelvic ascites. Generalized subcutaneous edema consistent with anasarca.     8/26 tte  1. Left ventricular ejection fraction, by estimation, is 60 to 65%. The  left ventricle has normal function. The left ventricle has no regional  wall motion abnormalities. Left ventricular diastolic parameters are  indeterminate.   2. Right ventricular systolic function is normal. The right ventricular  size is  normal. Tricuspid regurgitation signal is inadequate for assessing  PA pressure.   3. No evidence of mitral valve regurgitation.   4. The aortic valve was not well visualized. Aortic valve regurgitation  is not visualized.   5. IVC is not well visualzied.   Comparison(s): No significant change from prior study.    Jabier Mutton, [place] [facility] [place] [phone] pager    12/22/2021, 2:10 PM

## 2021-12-22 NOTE — Progress Notes (Signed)
Physical Therapy Treatment Patient Details Name: Anna Garcia MRN: [id number] DOB: 1981/01/08 Today's Date: 12/22/2021   History of Present Illness Pt is a 41 y/o female admitted secondary to LE swelling, RLE cellulitis, and liver cirrhosis. PMH includes DM, alcohol abuse, fibromyalgia, chronic pain syndrome, HTN, chronic urinary retention with self caths.    PT Comments    Patient initially tearful re: leg pain as walking to bathroom. After up for several minutes, pt less tearful and asked to walk farther. Ambulated 200 ft with minguard assist with RW. Patient with confusion and hallucination (thought she was in a school and classes were about to let out; thought there was a man in her room). RN made aware.     Recommendations for follow up therapy are one component of a multi-disciplinary discharge planning process, led by the attending physician.  Recommendations may be updated based on patient status, additional functional criteria and insurance authorization.  Follow Up Recommendations  Home health PT     Assistance Recommended at Discharge Intermittent Supervision/Assistance  Patient can return home with the following A little help with walking and/or transfers;A little help with bathing/dressing/bathroom;Assistance with cooking/housework;Help with stairs or ramp for entrance;Assist for transportation;Direct supervision/assist for medications management;Direct supervision/assist for financial management   Equipment Recommendations  Rolling walker (2 wheels)    Recommendations for Other Services       Precautions / Restrictions Precautions Precautions: Fall Restrictions Weight Bearing Restrictions: No Other Position/Activity Restrictions: weeping edema     Mobility  Bed Mobility Overal bed mobility: Needs Assistance Bed Mobility: Sit to Supine       Sit to supine: Min assist   General bed mobility comments: sitting EOB on arrival; min assist for each leg to lift onto  bed    Transfers Overall transfer level: Needs assistance Equipment used: Rolling walker (2 wheels) Transfers: Sit to/from Stand Sit to Stand: Min assist, Supervision           General transfer comment: Min A for lift assist and steadying from bed; from [facility] supervision. Increased time required and pt very tearful    Ambulation/Gait Ambulation/Gait assistance: Min assist Gait Distance (Feet): 200 Feet Assistive device: Rolling walker (2 wheels) Gait Pattern/deviations: Step-to pattern, Wide base of support Gait velocity: Decreased Gait velocity interpretation: <1.8 ft/sec, indicate of risk for recurrent falls   General Gait Details: initally tearful, but improved with walking; wide stance because "legs are sticking together"   Stairs             Wheelchair Mobility    Modified Rankin (Stroke Patients Only)       Balance Overall balance assessment: History of Falls, Needs assistance Sitting-balance support: Feet supported Sitting balance-Leahy Scale: Good     Standing balance support: Single extremity supported Standing balance-Leahy Scale: Poor Standing balance comment: Reliant on UE support                            Cognition Arousal/Alertness: Awake/alert Behavior During Therapy: Anxious (tearful) Overall Cognitive Status: No family/caregiver present to determine baseline cognitive functioning Area of Impairment: Awareness, Problem solving, Orientation                 Orientation Level: Place, Situation         Awareness: Emergent Problem Solving: Slow processing, Requires verbal cues General Comments: Very slow processing; thinking "classes are about to let out" and not sure where she is; when told hospital she thought  [facility]; hallucinating that there iis a man in her room        Exercises      General Comments        Pertinent Vitals/Pain Pain Assessment Pain Assessment: 0-10 Pain Score: 8  Pain Location: B legs Pain  Descriptors / Indicators: Tender Pain Intervention(s): Limited activity within patient's tolerance, Monitored during session    Home Living                          Prior Function            PT Goals (current goals can now be found in the care plan section) Acute Rehab PT Goals Patient Stated Goal: to decrease pain Time For Goal Achievement: 01/04/22 Potential to Achieve Goals: Good Progress towards PT goals: Progressing toward goals    Frequency    Min 3X/week      PT Plan Current plan remains appropriate (if confusion clears)    Co-evaluation              AM-PAC PT "6 Clicks" Mobility   Outcome Measure  Help needed turning from your back to your side while in a flat bed without using bedrails?: A Little Help needed moving from lying on your back to sitting on the side of a flat bed without using bedrails?: A Little Help needed moving to and from a bed to a chair (including a wheelchair)?: A Little Help needed standing up from a chair using your arms (e.g., wheelchair or bedside chair)?: A Little Help needed to walk in hospital room?: A Little Help needed climbing 3-5 steps with a railing? : Total 6 Click Score: 16    End of Session   Activity Tolerance: Patient limited by pain Patient left: in bed;with call bell/phone within reach;with bed alarm set Nurse Communication: Mobility status;Other (comment) (incr confusion; hallucinating) PT Visit Diagnosis: History of falling (Z91.81);Difficulty in walking, not elsewhere classified (R26.2);Pain Pain - Right/Left:  (bilateral) Pain - part of body: Leg     Time: 0947-0962 PT Time Calculation (min) (ACUTE ONLY): 45 min  Charges:  $Gait Training: 38-52 mins                      [place]  Office [phone]    Rexanne Mano 12/22/2021, 4:15 PM

## 2021-12-22 NOTE — Progress Notes (Signed)
PHARMACY - PHYSICIAN COMMUNICATION CRITICAL VALUE ALERT - BLOOD CULTURE IDENTIFICATION (BCID)  Anna Garcia is an 41 y.o. female who presented to [facility] on 12/20/2021 with a chief complaint of lower extremity edema  Assessment:  Streptococcus species  Name of physician (or Provider) Contacted: [place]  Current antibiotics: ceftriaxone  Changes to prescribed antibiotics recommended:  Recommended increasing ceftriaxone to 2G IV QD  Results for orders placed or performed during the hospital encounter of 12/20/21  Blood Culture ID Panel (Reflexed) (Collected: 12/21/2021  4:00 AM)  Result Value Ref Range   Enterococcus faecalis NOT DETECTED NOT DETECTED   Enterococcus Faecium NOT DETECTED NOT DETECTED   Listeria monocytogenes NOT DETECTED NOT DETECTED   Staphylococcus species NOT DETECTED NOT DETECTED   Staphylococcus aureus (BCID) NOT DETECTED NOT DETECTED   Staphylococcus epidermidis NOT DETECTED NOT DETECTED   Staphylococcus lugdunensis NOT DETECTED NOT DETECTED   Streptococcus species DETECTED (A) NOT DETECTED   Streptococcus agalactiae NOT DETECTED NOT DETECTED   Streptococcus pneumoniae NOT DETECTED NOT DETECTED   Streptococcus pyogenes NOT DETECTED NOT DETECTED   A.calcoaceticus-baumannii NOT DETECTED NOT DETECTED   Bacteroides fragilis NOT DETECTED NOT DETECTED   Enterobacterales NOT DETECTED NOT DETECTED   Enterobacter cloacae complex NOT DETECTED NOT DETECTED   Escherichia coli NOT DETECTED NOT DETECTED   Klebsiella aerogenes NOT DETECTED NOT DETECTED   Klebsiella oxytoca NOT DETECTED NOT DETECTED   Klebsiella pneumoniae NOT DETECTED NOT DETECTED   Proteus species NOT DETECTED NOT DETECTED   Salmonella species NOT DETECTED NOT DETECTED   Serratia marcescens NOT DETECTED NOT DETECTED   Haemophilus influenzae NOT DETECTED NOT DETECTED   Neisseria meningitidis NOT DETECTED NOT DETECTED   Pseudomonas aeruginosa NOT DETECTED NOT DETECTED   Stenotrophomonas maltophilia NOT  DETECTED NOT DETECTED   Candida albicans NOT DETECTED NOT DETECTED   Candida auris NOT DETECTED NOT DETECTED   Candida glabrata NOT DETECTED NOT DETECTED   Candida krusei NOT DETECTED NOT DETECTED   Candida parapsilosis NOT DETECTED NOT DETECTED   Candida tropicalis NOT DETECTED NOT DETECTED   Cryptococcus neoformans/gattii NOT DETECTED NOT DETECTED    Jodean Lima Brylan Dec 12/22/2021  2:33 AM

## 2021-12-22 NOTE — Progress Notes (Addendum)
PROGRESS NOTE        PATIENT DETAILS Name: LENIA HOUSLEY Age: 41 y.o. Sex: female Date of Birth: Jan 16, 1981 Admit Date: 12/20/2021 Admitting Physician Evalee Mutton Kristeen Mans, MD PCP:Day, [name], MD  Brief Summary: Patient is a 41 y.o.  female with history of DM-1, gastroparesis, chronic pain syndrome/fibromyalgia, HTN, chronic urinary retention-self caths at home-who was just discharged from this facility on 8/29 after being treated for DKA/AKI/volume overload-presented back to the hospital with anasarca-3+ pitting edema in the lower extremity up to the thighs.  Significant events: 8/22-8/29>> hospitalization for DKA/AKI-found to have liver cirrhosis 8/31>> admit for anasarca/significant lower extremity edema. 9/01>> IV albumin started-midodrine dosage escalated.  Significant studies: 2/23>>Hbs Ag, HCV HE:RDEYCXKG 8/24>> bilateral lower extremity Doppler: No DVT. 8/28>>ANA/RA/Anti mitrochondrial Ab/Anti smooth muscle YJ:EHUDJSHF 8/26>> Echo: EF-60-65%. 8/31>> CXR: No PNA 8/31>> CT chest/abdomen/pelvis: No PE, liver cirrhosis, prominent small bowel wall thickening, moderate volume ascites.  Generalized subcutaneous edema. 9/01>> UA: No proteinuria.  Significant microbiology data: 9/01>>1/2 blood culture: Gram-positive cocci in chains 9/01>>ascites fluid culture:pending  Procedures: 9/01>> diagnostic paracentesis-1 L (WBC 590 w 85% neutrophils)  Consults: GI, ID  Subjective: Continues to have some abdominal distention-does not complain of pain with abdominal discomfort.  She has significant swelling of lower extremities.  Thankfully BP somewhat better today.  Claims it is difficult to lift her legs because of the swelling.  Objective: Vitals: Blood pressure 109/77, pulse (!) 110, temperature 98.1 F (36.7 C), resp. rate 16, height '5\' 4"'$  (1.626 m), weight 84.9 kg, SpO2 100 %.   Exam: Gen Exam:Alert awake-not in any distress HEENT:atraumatic,  normocephalic Chest: B/L clear to auscultation anteriorly CVS:S1S2 regular Abdomen: Soft-still with some dullness in the flanks-some mild tenderness on deep palpation.  Extremities:3+++ edema-up to thighs.  Bilateral arms edematous as well. Neurology: Non focal Skin: no rash   Pertinent Labs/Radiology:    Latest Ref Rng & Units 12/22/2021    5:13 AM 12/21/2021    4:00 AM 12/20/2021    5:09 PM  CBC  WBC 4.0 - 10.5 K/uL 10.9  13.5    Hemoglobin 12.0 - 15.0 g/dL 9.2  9.1  10.9   Hematocrit 36.0 - 46.0 % 28.3  27.6  32.0   Platelets 150 - 400 K/uL 222  222      Lab Results  Component Value Date   NA 132 (L) 12/22/2021   K 3.4 (L) 12/22/2021   CL 100 12/22/2021   CO2 20 (L) 12/22/2021      Assessment/Plan: Anasarca-3+ pitting lower extremity edema up to thighs: Due to liver cirrhosis-thankfully BP better today with midodrine/IV albumin-increase Aldactone to 100 mg daily, increase furosemide to 40 mg daily.  Remains significantly volume overloaded with massive anasarca.  Given concern for SBP/mild AKI-massive anasarca-we will consult GI for further recommendations.  Follow weights/intake/output closely.  Streptococcus bacteremia: Continue IV Rocephin-await speciation-suspect either from skin breakdown of in foot area or possible SBP.  Infectious disease consulted today.  Possible SBP: Meets SBP criteria-paracentesis performed after IV antibiotics were already administered.  Does complain of some abdominal discomfort.  On IV Rocephin/albumin/midodrine-GI/ID consulted.  Cellulitis right foot: Erythema resolved but has significant weeping from massive anasarca.  On IV antibiotics.  Continue to elevate extremities as much as possible.  AKI: Mild-given bacteremia-possible SBP-at risk for further worsening-on midodrine/IV albumin/diuretics.  Watch closely for now-avoid nephrotoxic agents.  Liver cirrhosis: Diagnosed recently on imaging studies-hepatitis serology/autoimmune work-up negative.   Recent EGD June 2023 did not show any obvious varices.  Follows with GI at [facility] for gastroparesis-given above noted issues-have consulted GI.  History of esophageal stricture: S/p dilatation June 2023.  Denies any vomiting or dysphagia symptoms.  History of diabetic gastroparesis: No vomiting-appears stable-continue Reglan.  Encourage small portion meals.  DM-1 (A1c 7.6 on 7/30): Increase Semglee to 12 units twice daily, continue SSI.  Follow/optimize.  Recent Labs    12/21/21 1709 12/21/21 2119 12/22/21 0739  GLUCAP 244* 192* 164*     HLD: Continue statin  Moderate persistent asthma: Not in flare-continue bronchodilators.  History of chronic urinary retention-s/p self-catheterization at home  Chronic pain/fibromyalgia/neuropathy: Continue Neurontin-asking for more narcotics-given soft blood pressure-we will continue with current regimen.  Obesity Estimated body mass index is 32.13 kg/m as calculated from the following:   Height as of this encounter: '5\' 4"'$  (1.626 m).   Weight as of this encounter: 84.9 kg.   Code status:   Code Status: Full Code   DVT Prophylaxis: enoxaparin (LOVENOX) injection 40 mg Start: 12/21/21 1000   Family Communication: None at bedside   Disposition Plan: Status is: Observation The patient will require care spanning > 2 midnights and should be moved to inpatient because: Severe anasarca having difficulty ambulating-Will need inpatient optimization of her volume status before consideration of discharge.   Planned Discharge Destination:Home sometime next week.   Diet: Diet Order             Diet heart healthy/carb modified Room service appropriate? Yes; Fluid consistency: Thin  Diet effective now                     Antimicrobial agents: Anti-infectives (From admission, onward)    Start     Dose/Rate Route Frequency Ordered Stop   12/22/21 0800  cefTRIAXone (ROCEPHIN) 2 g in sodium chloride 0.9 % 100 mL IVPB         2 g 200 mL/hr over 30 Minutes Intravenous Every 24 hours 12/22/21 0236     12/21/21 2100  cefTRIAXone (ROCEPHIN) 1 g in sodium chloride 0.9 % 100 mL IVPB  Status:  Discontinued        1 g 200 mL/hr over 30 Minutes Intravenous Every 24 hours 12/21/21 0055 12/22/21 0236   12/21/21 0030  cefTRIAXone (ROCEPHIN) 1 g in sodium chloride 0.9 % 100 mL IVPB  Status:  Discontinued        1 g 200 mL/hr over 30 Minutes Intravenous  Once 12/21/21 0015 12/21/21 0055   12/20/21 2145  cefTRIAXone (ROCEPHIN) 1 g in sodium chloride 0.9 % 100 mL IVPB  Status:  Discontinued        1 g 200 mL/hr over 30 Minutes Intravenous  Once 12/20/21 2139 12/20/21 2214   12/20/21 0000  cephALEXin (KEFLEX) 500 MG capsule  Status:  Discontinued        500 mg Oral 4 times daily 12/20/21 2319 12/21/21         MEDICATIONS: Scheduled Meds:  dicyclomine  10 mg Oral TID AC & HS   enoxaparin (LOVENOX) injection  40 mg Subcutaneous Q24H   famotidine  40 mg Oral BID   furosemide  40 mg Oral Daily   gabapentin  300 mg Oral TID   insulin aspart  0-5 Units Subcutaneous QHS   insulin aspart  0-9 Units Subcutaneous TID WC   insulin glargine-yfgn  12 Units Subcutaneous  BID   lipase/protease/amylase  36,000 Units Oral TID WC   metoCLOPramide  10 mg Oral TID AC   midodrine  10 mg Oral TID WC   mometasone-formoterol  2 puff Inhalation BID   montelukast  10 mg Oral QHS   rosuvastatin  20 mg Oral QHS   sodium chloride flush  3 mL Intravenous Q12H   spironolactone  100 mg Oral Daily   Continuous Infusions:  albumin human 25 g (12/22/21 1017)   cefTRIAXone (ROCEPHIN)  IV 2 g (12/22/21 0827)   PRN Meds:.acetaminophen, albuterol, loperamide, naLOXone (NARCAN)  injection, oxyCODONE   I have personally reviewed following labs and imaging studies  LABORATORY DATA: CBC: Recent Labs  Lab 12/16/21 0117 12/18/21 0553 12/20/21 1626 12/20/21 1709 12/21/21 0400 12/22/21 0513  WBC 8.4 9.1 12.6*  --  13.5* 10.9*  NEUTROABS  --   --   8.5*  --   --   --   HGB 8.3* 9.1* 9.3* 10.9* 9.1* 9.2*  HCT 25.3* 27.6* 27.8* 32.0* 27.6* 28.3*  MCV 108.1* 108.7* 105.3*  --  108.2* 107.2*  PLT 176 231 219  --  222 222     Basic Metabolic Panel: Recent Labs  Lab 12/17/21 0503 12/18/21 0553 12/20/21 1626 12/20/21 1709 12/21/21 0400 12/22/21 0513  NA 134* 142 133* 132* 131* 132*  K 2.9* 3.0* 3.4* 3.3* 3.6 3.4*  CL 105 108 100 100 101 100  CO2 23 23 19*  --  20* 20*  GLUCOSE 99 99 238* 229* 185* 176*  BUN '13 13 8 7 8 9  '$ CREATININE 1.21* 1.20* 1.23* 1.00 1.22* 1.29*  CALCIUM 8.2* 8.8* 8.6*  --  8.4* 8.8*     GFR: Estimated Creatinine Clearance: 61.1 mL/min (A) (by C-G formula based on SCr of 1.29 mg/dL (H)).  Liver Function Tests: Recent Labs  Lab 12/17/21 0503 12/18/21 0553 12/20/21 1626 12/21/21 0400 12/22/21 0513  AST 53* 49* 39 37 35  ALT 37 33 '30 28 25  '$ ALKPHOS 196* 190* 191* 189* 169*  BILITOT 1.5* 1.3* 1.6* 1.8* 1.7*  PROT 6.2* 6.3* 5.6* 5.7* 6.2*  ALBUMIN 2.7* 2.8* 2.3* 2.3* 2.8*    No results for input(s): "LIPASE", "AMYLASE" in the last 168 hours. No results for input(s): "AMMONIA" in the last 168 hours.  Coagulation Profile: No results for input(s): "INR", "PROTIME" in the last 168 hours.  Cardiac Enzymes: No results for input(s): "CKTOTAL", "CKMB", "CKMBINDEX", "TROPONINI" in the last 168 hours.  BNP (last 3 results) No results for input(s): "PROBNP" in the last 8760 hours.  Lipid Profile: No results for input(s): "CHOL", "HDL", "LDLCALC", "TRIG", "CHOLHDL", "LDLDIRECT" in the last 72 hours.  Thyroid Function Tests: No results for input(s): "TSH", "T4TOTAL", "FREET4", "T3FREE", "THYROIDAB" in the last 72 hours.  Anemia Panel: No results for input(s): "VITAMINB12", "FOLATE", "FERRITIN", "TIBC", "IRON", "RETICCTPCT" in the last 72 hours.  Urine analysis:    Component Value Date/Time   COLORURINE YELLOW 12/21/2021 0809   APPEARANCEUR HAZY (A) 12/21/2021 0809   LABSPEC 1.027 12/21/2021  0809   PHURINE 6.0 12/21/2021 0809   GLUCOSEU 50 (A) 12/21/2021 0809   HGBUR NEGATIVE 12/21/2021 0809   BILIRUBINUR NEGATIVE 12/21/2021 0809   KETONESUR NEGATIVE 12/21/2021 0809   PROTEINUR NEGATIVE 12/21/2021 0809   UROBILINOGEN 0.2 04/11/2011 1437   NITRITE NEGATIVE 12/21/2021 0809   LEUKOCYTESUR SMALL (A) 12/21/2021 0809    Sepsis Labs: Lactic Acid, Venous    Component Value Date/Time   LATICACIDVEN 7.5 ([place]) 12/05/2021 5638  MICROBIOLOGY: Recent Results (from the past 240 hour(s))  Blood culture (routine x 2)     Status: None (Preliminary result)   Collection Time: 12/21/21  4:00 AM   Specimen: BLOOD  Result Value Ref Range Status   Specimen Description BLOOD SITE NOT SPECIFIED  Final   Special Requests   Final    BOTTLES DRAWN AEROBIC AND ANAEROBIC Blood Culture adequate volume   Culture  Setup Time   Final    GRAM POSITIVE COCCI IN CHAINS AEROBIC BOTTLE ONLY Organism ID to follow CRITICAL RESULT CALLED TO, READ BACK BY AND VERIFIED WITH: T RUDISILL,PHARMD'@0229'$  12/22/21 [place]    Culture   Final    NO GROWTH < 12 HOURS Performed at [facility], [address] [place], [place] [address]    Report Status PENDING  Incomplete  Blood Culture ID Panel (Reflexed)     Status: Abnormal   Collection Time: 12/21/21  4:00 AM  Result Value Ref Range Status   Enterococcus faecalis NOT DETECTED NOT DETECTED Final   Enterococcus Faecium NOT DETECTED NOT DETECTED Final   Listeria monocytogenes NOT DETECTED NOT DETECTED Final   Staphylococcus species NOT DETECTED NOT DETECTED Final   Staphylococcus aureus (BCID) NOT DETECTED NOT DETECTED Final   Staphylococcus epidermidis NOT DETECTED NOT DETECTED Final   Staphylococcus lugdunensis NOT DETECTED NOT DETECTED Final   Streptococcus species DETECTED (A) NOT DETECTED Final    Comment: Not Enterococcus species, Streptococcus agalactiae, Streptococcus pyogenes, or Streptococcus pneumoniae. CRITICAL RESULT CALLED TO, READ BACK BY  AND VERIFIED WITH: T RUDISILL,PHARMD'@0229'$  12/22/21 [place]    Streptococcus agalactiae NOT DETECTED NOT DETECTED Final   Streptococcus pneumoniae NOT DETECTED NOT DETECTED Final   Streptococcus pyogenes NOT DETECTED NOT DETECTED Final   A.calcoaceticus-baumannii NOT DETECTED NOT DETECTED Final   Bacteroides fragilis NOT DETECTED NOT DETECTED Final   Enterobacterales NOT DETECTED NOT DETECTED Final   Enterobacter cloacae complex NOT DETECTED NOT DETECTED Final   Escherichia coli NOT DETECTED NOT DETECTED Final   Klebsiella aerogenes NOT DETECTED NOT DETECTED Final   Klebsiella oxytoca NOT DETECTED NOT DETECTED Final   Klebsiella pneumoniae NOT DETECTED NOT DETECTED Final   Proteus species NOT DETECTED NOT DETECTED Final   Salmonella species NOT DETECTED NOT DETECTED Final   Serratia marcescens NOT DETECTED NOT DETECTED Final   Haemophilus influenzae NOT DETECTED NOT DETECTED Final   Neisseria meningitidis NOT DETECTED NOT DETECTED Final   Pseudomonas aeruginosa NOT DETECTED NOT DETECTED Final   Stenotrophomonas maltophilia NOT DETECTED NOT DETECTED Final   Candida albicans NOT DETECTED NOT DETECTED Final   Candida auris NOT DETECTED NOT DETECTED Final   Candida glabrata NOT DETECTED NOT DETECTED Final   Candida krusei NOT DETECTED NOT DETECTED Final   Candida parapsilosis NOT DETECTED NOT DETECTED Final   Candida tropicalis NOT DETECTED NOT DETECTED Final   Cryptococcus neoformans/gattii NOT DETECTED NOT DETECTED Final    Comment: Performed at [facility], [address] [place], [place] [address]  Blood culture (routine x 2)     Status: None (Preliminary result)   Collection Time: 12/21/21  4:08 AM   Specimen: BLOOD  Result Value Ref Range Status   Specimen Description BLOOD SITE NOT SPECIFIED  Final   Special Requests   Final    BOTTLES DRAWN AEROBIC ONLY Blood Culture results may not be optimal due to an inadequate volume of blood received in culture bottles   Culture   Final     NO GROWTH <  12 HOURS Performed at [facility], [place] [address] [place], [place] [address]    Report Status PENDING  Incomplete  Gram stain     Status: None   Collection Time: 12/21/21  3:21 PM   Specimen: Fluid  Result Value Ref Range Status   Specimen Description FLUID PERITONEAL  Final   Special Requests NONE  Final   Gram Stain   Final    WBC PRESENT,BOTH PMN AND MONONUCLEAR NO ORGANISMS SEEN CYTOSPIN SMEAR Performed at [facility], [address], [place], [place] [address]    Report Status 12/21/2021 FINAL  Final    RADIOLOGY STUDIES/RESULTS: IR Paracentesis  Result Date: 12/21/2021 INDICATION: Patient with history of cirrhosis, previous ultrasound showed ascites. Request for therapeutic and diagnostic paracentesis up to 1 L. EXAM: ULTRASOUND GUIDED DIAGNOSTIC AND THERAPEUTIC PARACENTESIS MEDICATIONS: 10 mL 1% lidocaine COMPLICATIONS: None immediate. PROCEDURE: Informed written consent was obtained from the patient after a discussion of the risks, benefits and alternatives to treatment. A timeout was performed prior to the initiation of the procedure. Initial ultrasound scanning demonstrates a small amount of ascites within the right lower abdominal quadrant. The right lower abdomen was prepped and draped in the usual sterile fashion. 1% lidocaine was used for local anesthesia. Following this, a 19 gauge, 7-cm, Yueh catheter was introduced. An ultrasound image was saved for documentation purposes. The paracentesis was performed. The catheter was removed and a dressing was applied. The patient tolerated the procedure well without immediate post procedural complication. FINDINGS: A total of approximately 1 L of hazy yellow fluid was removed. Samples were sent to the laboratory as requested by the clinical team. IMPRESSION: Successful ultrasound-guided paracentesis yielding 1 L of peritoneal fluid. Read by: Durenda Guthrie, PA-C PLAN: If the patient eventually requires >/=2  paracenteses in a 30 day period, candidacy for formal evaluation by the [facility] will be assessed. Michaelle Birks, MD [facility] Radiology Electronically Signed   By: Michaelle Birks M.D.   On: 12/21/2021 15:39   US Abdomen Limited  Result Date: 12/21/2021 CLINICAL DATA:  Ascites EXAM: LIMITED ABDOMEN ULTRASOUND FOR ASCITES TECHNIQUE: Limited ultrasound survey for ascites was performed in all four abdominal quadrants. COMPARISON:  CT abdomen and pelvis 12/21/2018. FINDINGS: Moderate volume ascites demonstrated within all 4 quadrants. IMPRESSION: Moderate volume abdominal ascites. Electronically Signed   By: Lovey Newcomer M.D.   On: 12/21/2021 11:59   CT Abdomen Pelvis W Contrast  Result Date: 12/20/2021 CLINICAL DATA:  Chest pain abdominal pain EXAM: CT ANGIOGRAPHY CHEST CT ABDOMEN AND PELVIS WITH CONTRAST TECHNIQUE: Multidetector CT imaging of the chest was performed using the standard protocol during bolus administration of intravenous contrast. Multiplanar CT image reconstructions and MIPs were obtained to evaluate the vascular anatomy. Multidetector CT imaging of the abdomen and pelvis was performed using the standard protocol during bolus administration of intravenous contrast. RADIATION DOSE REDUCTION: This exam was performed according to the departmental dose-optimization program which includes automated exposure control, adjustment of the mA and/or kV according to patient size and/or use of iterative reconstruction technique. CONTRAST:  147m OMNIPAQUE IOHEXOL 350 MG/ML SOLN COMPARISON:  12/06/2021, chest x-ray 12/20/2021, CT chest 04/11/2011 FINDINGS: CTA CHEST FINDINGS Cardiovascular: Satisfactory opacification of the pulmonary arteries to the segmental level. No evidence of pulmonary embolism. Normal heart size. No pericardial effusion. Nonaneurysmal aorta. No dissection is seen. Mediastinum/Nodes: No  enlarged mediastinal, hilar, or axillary lymph nodes. Thyroid gland, trachea, and esophagus demonstrate  no significant findings. Lungs/Pleura: Subsegmental atelectasis in the bases. No acute airspace disease, pleural effusion or pneumothorax Musculoskeletal: No chest wall abnormality. No acute or significant osseous findings. Review of the MIP images confirms the above findings. CT ABDOMEN and PELVIS FINDINGS Hepatobiliary: Liver cirrhosis. Slightly distorted appearance of the central parenchyma. Prominent central liver enhancement. Contracted gallbladder without calcified stone. Pancreas: No inflammation. Suspect significant atrophy. Not much pancreatic tissue identified. Spleen: Normal in size without focal abnormality. Adrenals/Urinary Tract: Adrenal glands are unremarkable. Kidneys are normal, without renal calculi, focal lesion, or hydronephrosis. Bladder is unremarkable. Stomach/Bowel: Stomach is nonenlarged. No dilated small bowel. Multiple thickened small bowel loops most evident in the left upper quadrant involving the jejunum. There is diffuse colon wall thickening. Vascular/Lymphatic: Nonaneurysmal aorta. Mild atherosclerosis. Subcentimeter retroperitoneal lymph nodes. Upper intra hepatic IVC appears slightly narrowed. Reproductive: IUD in the uterus.  No adnexal mass. Other: Moderate ascites within the abdomen and pelvis. No free air. Generalized edema consistent with anasarca. Musculoskeletal: No acute osseous abnormality. Review of the MIP images confirms the above findings. IMPRESSION: 1. No definite CT evidence for acute pulmonary embolus. Subsegmental atelectasis at the bases. 2. Liver cirrhosis with distorted appearance of the central parenchyma suggestive of fibrosis. When the patient is clinically stable and able to follow directions and hold their breath (preferably as an outpatient) further evaluation with dedicated abdominal MRI should be considered. 3. Prominent small bowel thickening and  diffuse colon wall thickening. Differential considerations include enterocolitis versus nonspecific bowel edema secondary to edematous state or hypoproteinemia. 4. Moderate volume of abdominopelvic ascites. Generalized subcutaneous edema consistent with anasarca. Electronically Signed   By: Donavan Foil M.D.   On: 12/20/2021 19:46   CT Angio Chest PE W/Cm &/Or Wo Cm  Result Date: 12/20/2021 CLINICAL DATA:  Chest pain abdominal pain EXAM: CT ANGIOGRAPHY CHEST CT ABDOMEN AND PELVIS WITH CONTRAST TECHNIQUE: Multidetector CT imaging of the chest was performed using the standard protocol during bolus administration of intravenous contrast. Multiplanar CT image reconstructions and MIPs were obtained to evaluate the vascular anatomy. Multidetector CT imaging of the abdomen and pelvis was performed using the standard protocol during bolus administration of intravenous contrast. RADIATION DOSE REDUCTION: This exam was performed according to the departmental dose-optimization program which includes automated exposure control, adjustment of the mA and/or kV according to patient size and/or use of iterative reconstruction technique. CONTRAST:  163m OMNIPAQUE IOHEXOL 350 MG/ML SOLN COMPARISON:  12/06/2021, chest x-ray 12/20/2021, CT chest 04/11/2011 FINDINGS: CTA CHEST FINDINGS Cardiovascular: Satisfactory opacification of the pulmonary arteries to the segmental level. No evidence of pulmonary embolism. Normal heart size. No pericardial effusion. Nonaneurysmal aorta. No dissection is seen. Mediastinum/Nodes: No enlarged mediastinal, hilar, or axillary lymph nodes. Thyroid gland, trachea, and esophagus demonstrate no significant findings. Lungs/Pleura: Subsegmental atelectasis in the bases. No acute airspace disease, pleural effusion or pneumothorax Musculoskeletal: No chest wall abnormality. No acute or significant osseous findings. Review of the MIP images confirms the above findings. CT ABDOMEN and PELVIS FINDINGS  Hepatobiliary: Liver cirrhosis. Slightly distorted appearance of the central parenchyma. Prominent central liver enhancement. Contracted gallbladder without calcified stone. Pancreas: No inflammation. Suspect significant atrophy. Not much pancreatic tissue identified. Spleen: Normal in size without focal abnormality. Adrenals/Urinary Tract: Adrenal glands are unremarkable. Kidneys are normal, without renal calculi, focal lesion, or hydronephrosis. Bladder is unremarkable. Stomach/Bowel: Stomach is nonenlarged. No dilated small bowel. Multiple thickened small bowel loops most evident in the left upper quadrant involving the jejunum. There is diffuse colon wall thickening. Vascular/Lymphatic: Nonaneurysmal  aorta. Mild atherosclerosis. Subcentimeter retroperitoneal lymph nodes. Upper intra hepatic IVC appears slightly narrowed. Reproductive: IUD in the uterus.  No adnexal mass. Other: Moderate ascites within the abdomen and pelvis. No free air. Generalized edema consistent with anasarca. Musculoskeletal: No acute osseous abnormality. Review of the MIP images confirms the above findings. IMPRESSION: 1. No definite CT evidence for acute pulmonary embolus. Subsegmental atelectasis at the bases. 2. Liver cirrhosis with distorted appearance of the central parenchyma suggestive of fibrosis. When the patient is clinically stable and able to follow directions and hold their breath (preferably as an outpatient) further evaluation with dedicated abdominal MRI should be considered. 3. Prominent small bowel thickening and diffuse colon wall thickening. Differential considerations include enterocolitis versus nonspecific bowel edema secondary to edematous state or hypoproteinemia. 4. Moderate volume of abdominopelvic ascites. Generalized subcutaneous edema consistent with anasarca. Electronically Signed   By: Donavan Foil M.D.   On: 12/20/2021 19:46   DG Chest Port 1 View  Result Date: 12/20/2021 CLINICAL DATA:  Shortness of  breath. EXAM: PORTABLE CHEST 1 VIEW COMPARISON:  Chest x-ray 12/04/2021 FINDINGS: The heart size and mediastinal contours are within normal limits. Both lungs are clear. The visualized skeletal structures are unremarkable. IMPRESSION: No active disease. Electronically Signed   By: Ronney Asters M.D.   On: 12/20/2021 17:21     LOS: 1 day   Oren Binet, MD  [facility]    To contact the attending provider between 7A-7P or the covering provider during after hours 7P-7A, please log into the web site [email or web address] and access using universal [place] password for that web site. If you do not have the password, please call the hospital operator.  12/22/2021, 10:54 AM

## 2021-12-23 DIAGNOSIS — L03115 Cellulitis of right lower limb: Secondary | ICD-10-CM | POA: Diagnosis not present

## 2021-12-23 DIAGNOSIS — K7460 Unspecified cirrhosis of liver: Secondary | ICD-10-CM | POA: Diagnosis not present

## 2021-12-23 DIAGNOSIS — R188 Other ascites: Secondary | ICD-10-CM | POA: Diagnosis not present

## 2021-12-23 DIAGNOSIS — R601 Generalized edema: Secondary | ICD-10-CM | POA: Diagnosis not present

## 2021-12-23 DIAGNOSIS — Z789 Other specified health status: Secondary | ICD-10-CM | POA: Diagnosis not present

## 2021-12-23 LAB — CBC
HCT: 21.8 % — ABNORMAL LOW (ref 36.0–46.0)
Hemoglobin: 7.2 g/dL — ABNORMAL LOW (ref 12.0–15.0)
MCH: 35 pg — ABNORMAL HIGH (ref 26.0–34.0)
MCHC: 33 g/dL (ref 30.0–36.0)
MCV: 105.8 fL — ABNORMAL HIGH (ref 80.0–100.0)
Platelets: 157 10*3/uL (ref 150–400)
RBC: 2.06 MIL/uL — ABNORMAL LOW (ref 3.87–5.11)
RDW: 17.7 % — ABNORMAL HIGH (ref 11.5–15.5)
WBC: 6.6 10*3/uL (ref 4.0–10.5)
nRBC: 0 % (ref 0.0–0.2)

## 2021-12-23 LAB — COMPREHENSIVE METABOLIC PANEL
ALT: 18 U/L (ref 0–44)
AST: 31 U/L (ref 15–41)
Albumin: 3.3 g/dL — ABNORMAL LOW (ref 3.5–5.0)
Alkaline Phosphatase: 129 U/L — ABNORMAL HIGH (ref 38–126)
Anion gap: 11 (ref 5–15)
BUN: 7 mg/dL (ref 6–20)
CO2: 23 mmol/L (ref 22–32)
Calcium: 9.1 mg/dL (ref 8.9–10.3)
Chloride: 106 mmol/L (ref 98–111)
Creatinine, Ser: 1.33 mg/dL — ABNORMAL HIGH (ref 0.44–1.00)
GFR, Estimated: 52 mL/min — ABNORMAL LOW (ref >60)
Glucose, Bld: 112 mg/dL — ABNORMAL HIGH (ref 70–99)
Potassium: 3 mmol/L — ABNORMAL LOW (ref 3.5–5.1)
Sodium: 140 mmol/L (ref 135–145)
Total Bilirubin: 1.1 mg/dL (ref 0.3–1.2)
Total Protein: 5.8 g/dL — ABNORMAL LOW (ref 6.5–8.1)

## 2021-12-23 LAB — FOLATE: Folate: 5.3 ng/mL — ABNORMAL LOW (ref >5.9)

## 2021-12-23 LAB — GLUCOSE, CAPILLARY
Glucose-Capillary: 83 mg/dL (ref 70–99)
Glucose-Capillary: 104 mg/dL — ABNORMAL HIGH (ref 70–99)
Glucose-Capillary: 104 mg/dL — ABNORMAL HIGH (ref 70–99)
Glucose-Capillary: 116 mg/dL — ABNORMAL HIGH (ref 70–99)

## 2021-12-23 LAB — MAGNESIUM: Magnesium: 1.9 mg/dL (ref 1.7–2.4)

## 2021-12-23 LAB — AMMONIA: Ammonia: 37 umol/L — ABNORMAL HIGH (ref 9–35)

## 2021-12-23 LAB — VITAMIN B12: Vitamin B-12: 1027 pg/mL — ABNORMAL HIGH (ref 180–914)

## 2021-12-23 MED ORDER — FOLIC ACID 1 MG PO TABS
1.0000 mg | ORAL_TABLET | Freq: Every day | ORAL | Status: DC
  Administered 2021-12-23: 1 mg via ORAL
  Filled 2021-12-23: qty 1

## 2021-12-23 MED ORDER — ADULT MULTIVITAMIN W/MINERALS CH
1.0000 | ORAL_TABLET | Freq: Every day | ORAL | Status: DC
  Administered 2021-12-23 – 2022-01-04 (×11): 1 via ORAL
  Filled 2021-12-23 (×11): qty 1

## 2021-12-23 MED ORDER — LACTULOSE 10 GM/15ML PO SOLN
10.0000 g | Freq: Two times a day (BID) | ORAL | Status: DC
  Administered 2021-12-23 – 2022-01-04 (×22): 10 g via ORAL
  Filled 2021-12-23 (×23): qty 15

## 2021-12-23 MED ORDER — THIAMINE MONONITRATE 100 MG PO TABS
100.0000 mg | ORAL_TABLET | Freq: Every day | ORAL | Status: DC
  Administered 2021-12-23: 100 mg via ORAL
  Filled 2021-12-23: qty 1

## 2021-12-23 MED ORDER — LORAZEPAM 2 MG/ML IJ SOLN
1.0000 mg | INTRAMUSCULAR | Status: DC | PRN
  Administered 2021-12-23: 4 mg via INTRAVENOUS
  Administered 2021-12-24: 1 mg via INTRAVENOUS
  Administered 2021-12-25 (×3): 2 mg via INTRAVENOUS
  Filled 2021-12-23 (×3): qty 1
  Filled 2021-12-23: qty 2
  Filled 2021-12-23: qty 1

## 2021-12-23 MED ORDER — POTASSIUM CHLORIDE CRYS ER 20 MEQ PO TBCR
40.0000 meq | EXTENDED_RELEASE_TABLET | ORAL | Status: AC
  Administered 2021-12-23 (×2): 40 meq via ORAL
  Filled 2021-12-23 (×2): qty 2

## 2021-12-23 MED ORDER — THIAMINE HCL 100 MG/ML IJ SOLN: 100.0000 mg | Freq: Every day | INTRAMUSCULAR | Status: DC

## 2021-12-23 MED ORDER — LORAZEPAM 1 MG PO TABS
1.0000 mg | ORAL_TABLET | ORAL | Status: DC | PRN
  Administered 2021-12-23 – 2021-12-24 (×4): 1 mg via ORAL
  Filled 2021-12-23 (×4): qty 1

## 2021-12-23 MED ORDER — FOLIC ACID 1 MG PO TABS
2.0000 mg | ORAL_TABLET | Freq: Every day | ORAL | Status: DC
  Administered 2021-12-24 – 2022-01-04 (×11): 2 mg via ORAL
  Filled 2021-12-23 (×11): qty 2

## 2021-12-23 NOTE — Progress Notes (Signed)
During the night, the patient became increasingly confused, impulsive, and paranoid.  She felt staff was being "harsh" to her.  She attempted to walk off the unit twice.  She continues to pull her telemetry box and leads off.  Bladder scan done this morning and showed 560 cc.  I & O catheter done and 800 cc out.  Dr. Marlowe Sax called and made aware.  Will continue to monitor patient.  Earleen Reaper RN

## 2021-12-23 NOTE — Progress Notes (Signed)
Dear Doctor: Sloan Leiter  This patient has been identified as a candidate for PICC for the following reason (s): IV therapy over 48 hours and poor veins/poor circulatory system (CHF, COPD, emphysema, diabetes, steroid use, IV drug abuse, etc.) If you agree, please write an order for the indicated device. For any questions contact the Vascular Access Team at [phone] if no answer, please leave a message.  Thank you for supporting the early vascular access assessment program.

## 2021-12-23 NOTE — Progress Notes (Signed)
Patient has been having hallucinations about an albino man. She is also having auditory hallucinations and having conversations with said person. CIWA was elevated and po ativan given.   Patient has now pulled out IV, resisting staff and very unsteady on feet. She is also pulling off of telemetry and has called 911. She is having increasing confusion. Patient directed to bed and blood from IV cleaned from patient. Oncoming RN at bedside and will consult MD for further orders.

## 2021-12-23 NOTE — Progress Notes (Signed)
Occupational Therapy Treatment Patient Details Name: Anna Garcia MRN: [id number] DOB: Nov 28, 1980 Today's Date: 12/23/2021   History of present illness Pt is a 41 y/o female admitted secondary to LE swelling, RLE cellulitis, and liver cirrhosis. PMH includes DM, alcohol abuse, fibromyalgia, chronic pain syndrome, HTN, chronic urinary retention with self caths.   OT comments  Patient appeared to have decreased confusion during visit but had bouts of statements that the hospital was a college campus. Patient began to describe an object and asked if it was a hallucination. Patient was performed mobility with min guard to supervision, mod assist for LB dressing and min assist for bed mobility. Patient to continue to be followed by acute OT.    Recommendations for follow up therapy are one component of a multi-disciplinary discharge planning process, led by the attending physician.  Recommendations may be updated based on patient status, additional functional criteria and insurance authorization.    Follow Up Recommendations  No OT follow up    Assistance Recommended at Discharge Set up Supervision/Assistance  Patient can return home with the following  A little help with walking and/or transfers;A little help with bathing/dressing/bathroom;Assistance with cooking/housework;Assist for transportation   Equipment Recommendations  BSC/3in1    Recommendations for Other Services      Precautions / Restrictions Precautions Precautions: Fall Restrictions Weight Bearing Restrictions: No Other Position/Activity Restrictions: weeping edema       Mobility Bed Mobility Overal bed mobility: Needs Assistance Bed Mobility: Supine to Sit, Sit to Supine     Supine to sit: Min assist Sit to supine: Min assist   General bed mobility comments: assistance to scooting hips to EOB and assistance with getting BLE onto bed    Transfers Overall transfer level: Needs assistance Equipment used: Rolling  walker (2 wheels) Transfers: Sit to/from Stand Sit to Stand: Min assist, Supervision           General transfer comment: min assist to stand from EOB     Balance Overall balance assessment: History of Falls, Needs assistance Sitting-balance support: Feet supported Sitting balance-Leahy Scale: Good     Standing balance support: Single extremity supported Standing balance-Leahy Scale: Poor Standing balance comment: Reliant on UE support                           ADL either performed or assessed with clinical judgement   ADL Overall ADL's : Needs assistance/impaired                     Lower Body Dressing: Moderate assistance;Sit to/from stand Lower Body Dressing Details (indicate cue type and reason): able to doff socks and donn right sock but unable to donn on left               General ADL Comments: difficulty with LB ADLs    Extremity/Trunk Assessment              Vision       Perception     Praxis      Cognition Arousal/Alertness: Awake/alert Behavior During Therapy: WFL for tasks assessed/performed Overall Cognitive Status: No family/caregiver present to determine baseline cognitive functioning Area of Impairment: Awareness, Problem solving, Orientation                 Orientation Level: Place, Situation         Awareness: Emergent Problem Solving: Slow processing, Requires verbal cues General Comments: appeared more cognitive intack but  with occasional statements about the hospital being a college campus and she performed here when she was younger.        Exercises      Shoulder Instructions       General Comments      Pertinent Vitals/ Pain       Pain Assessment Pain Assessment: Faces Faces Pain Scale: Hurts little more Pain Location: B legs Pain Descriptors / Indicators: Tender Pain Intervention(s): Monitored during session, Repositioned  Home Living                                           Prior Functioning/Environment              Frequency  Min 2X/week        Progress Toward Goals  OT Goals(current goals can now be found in the care plan section)  Progress towards OT goals: Progressing toward goals  Acute Rehab OT Goals Patient Stated Goal: go home OT Goal Formulation: With patient Time For Goal Achievement: 01/04/22 Potential to Achieve Goals: Good ADL Goals Pt Will Perform Grooming: with set-up;standing Pt Will Perform Lower Body Bathing: with modified independence;sit to/from stand Pt Will Perform Lower Body Dressing: with min assist;sit to/from stand;with adaptive equipment Pt Will Transfer to Toilet: with modified independence;ambulating;regular height toilet Pt Will Perform Tub/Shower Transfer: Tub transfer;ambulating;grab bars;rolling walker Additional ADL Goal #1: Pt will walk to bathroom without walker and complete all toileting tasks with no assist.  Plan Discharge plan remains appropriate    Co-evaluation                 AM-PAC OT "6 Clicks" Daily Activity     Outcome Measure   Help from another person eating meals?: None Help from another person taking care of personal grooming?: None Help from another person toileting, which includes using toliet, bedpan, or urinal?: A Little Help from another person bathing (including washing, rinsing, drying)?: A Lot Help from another person to put on and taking off regular upper body clothing?: None Help from another person to put on and taking off regular lower body clothing?: A Lot 6 Click Score: 19    End of Session Equipment Utilized During Treatment: Rolling walker (2 wheels)  OT Visit Diagnosis: Unsteadiness on feet (R26.81);History of falling (Z91.81);Pain Pain - Right/Left: Right Pain - part of body: Leg   Activity Tolerance Patient tolerated treatment well   Patient Left in bed;with call bell/phone within reach   Nurse Communication Mobility status        Time:  7619-5093 OT Time Calculation (min): 49 min  Charges: OT General Charges $OT Visit: 1 Visit OT Treatments $Self Care/Home Management : 8-22 mins $Therapeutic Activity: 23-37 mins  Lodema Hong, [place]  Office [phone]   Trixie Dredge 12/23/2021, 2:34 PM

## 2021-12-23 NOTE — Progress Notes (Addendum)
[facility] Progress Note  CC:  SBP  Subjective:  Feels a little better this morning.  Abdominal pain minimally better.  There were reports of confusion overnight but she explains that it was more frustration.  Was reporting some brain fog to my attending.  Objective:  Vital signs in last 24 hours: Temp:  [98.1 F (36.7 C)-98.5 F (36.9 C)] 98.4 F (36.9 C) (09/03 1001) Pulse Rate:  [98-110] 100 (09/03 1001) Resp:  [16-18] 18 (09/03 1001) BP: (91-126)/(62-75) 122/71 (09/03 1001) SpO2:  [99 %-100 %] 100 % (09/03 1001) Weight:  [84.7 kg] 84.7 kg (09/03 0535) Last BM Date : 12/21/21 General:  Alert, Well-developed, in NAD Heart:  Regular rate and rhythm; no murmurs Pulm:  CTAB. No W/R/R. Abdomen:  Soft, slightly distended with ascites fluid.  BS present.  Mild diffuse TTP. Extremities:  Significant pitting edema up to her thighs/groin. Neurologic:  Alert and oriented x 4;  grossly normal neurologically.  Intake/Output from previous day: 09/02 0701 - 09/03 0700 In: 1631.6 [P.O.:1300; IV Piggyback:331.6] Out: 3700 [Urine:3700]  Lab Results: Recent Labs    12/21/21 0400 12/22/21 0513 12/23/21 0448  WBC 13.5* 10.9* 6.6  HGB 9.1* 9.2* 7.2*  HCT 27.6* 28.3* 21.8*  PLT 222 222 157   BMET Recent Labs    12/21/21 0400 12/22/21 0513 12/23/21 0448  NA 131* 132* 140  K 3.6 3.4* 3.0*  CL 101 100 106  CO2 20* 20* 23  GLUCOSE 185* 176* 112*  BUN '8 9 7  '$ CREATININE 1.22* 1.29* 1.33*  CALCIUM 8.4* 8.8* 9.1   LFT Recent Labs    12/23/21 0448  PROT 5.8*  ALBUMIN 3.3*  AST 31  ALT 18  ALKPHOS 129*  BILITOT 1.1   IR Paracentesis  Result Date: 12/21/2021 INDICATION: Patient with history of cirrhosis, previous ultrasound showed ascites. Request for therapeutic and diagnostic paracentesis up to 1 L. EXAM: ULTRASOUND GUIDED DIAGNOSTIC AND THERAPEUTIC PARACENTESIS MEDICATIONS: 10 mL 1% lidocaine COMPLICATIONS: None immediate. PROCEDURE: Informed written  consent was obtained from the patient after a discussion of the risks, benefits and alternatives to treatment. A timeout was performed prior to the initiation of the procedure. Initial ultrasound scanning demonstrates a small amount of ascites within the right lower abdominal quadrant. The right lower abdomen was prepped and draped in the usual sterile fashion. 1% lidocaine was used for local anesthesia. Following this, a 19 gauge, 7-cm, Yueh catheter was introduced. An ultrasound image was saved for documentation purposes. The paracentesis was performed. The catheter was removed and a dressing was applied. The patient tolerated the procedure well without immediate post procedural complication. FINDINGS: A total of approximately 1 L of hazy yellow fluid was removed. Samples were sent to the laboratory as requested by the clinical team. IMPRESSION: Successful ultrasound-guided paracentesis yielding 1 L of peritoneal fluid. Read by: Durenda Guthrie, PA-C PLAN: If the patient eventually requires >/=2 paracenteses in a 30 day period, candidacy for formal evaluation by the [facility] will be assessed. Michaelle Birks, MD [facility] Electronically Signed   By: Michaelle Birks M.D.   On: 12/21/2021 15:39   US Abdomen Limited  Result Date: 12/21/2021 CLINICAL DATA:  Ascites EXAM: LIMITED ABDOMEN ULTRASOUND FOR ASCITES TECHNIQUE: Limited ultrasound survey for ascites was performed in all four abdominal quadrants. COMPARISON:  CT abdomen and pelvis 12/21/2018. FINDINGS: Moderate volume ascites demonstrated within all 4 quadrants. IMPRESSION: Moderate volume abdominal ascites. Electronically Signed  By: Lovey Newcomer M.D.   On: 12/21/2021 11:59    Assessment / Plan: *Cirrhosis, new diagnosis:  Now with anasarca with LE edema and ascites.  Hepatitis B and hepatitis C serology negative from 2/23.  ANA, AMA, ASMA negative in  August.  Ceruloplasmin low.   *SBP:  Seen on her tap this admission.  Had 1 Liter of fluid removed 9/1.  Receiving albumin, Rocephin, midodrine.   *Strep bacteremia:  Seen on her blood cultures.  ID is seeing her. *IDDM with severe gastroparesis.  Recent DKA with admission.  Follows with GI at [facility] went there because of her gastroparesis. *Chronic pain syndrome and fibromyalgia *Anemia:  MCV is high.  Iron studies normal.  -Await AFP, alpha-1 antitrypsin, total IgG to complete serologic work-up.  Await 24-hour urine collection for copper with a low ceruloplasmin. -2 g sodium diet. -Has already been started on lasix 40 mg daily and aldactone 100 mg daily. -Monitor renal function and electrolytes. -Will check B12 and folate levels. -Will need SBP prophylaxis with either cipro or septra going forward. -Consider repeat diagnostic paracentesis on 9/6 to be sure infection is resolving. -Will add low dose lactulose 10 grams BID for some complaints of "brain fog".     LOS: 2 days   Laban Emperor. Zehr  12/23/2021, 10:42 AM    Attending physician's note   I have taken history, reviewed the chart and examined the patient. I performed a substantive portion of this encounter, including complete performance of at least one of the key components, in conjunction with the APP. I agree with the Advanced Practitioner's note, impression and recommendations.   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 No EV on EGD 09/2021. MELD 3.0 12. SBP (S/P tap 9/1) Ascites with generalized anasarca/LE significant edema. ?  Subclinical hepatic encephalopathy (H/O "brain fog") Strep bacteremia (+ BC). ID following AKI on CKD IDDM with severe gastroparesis-followed at [facility]. Chronic pain syndrome/fibromyalgia  Plan: -Low Na diet -IV Alb/rocephin (day 2)/midodrine -Agree with Lasix/Aldactone 40/100.  Trend BUN/Cr, lytes. -Start lactulose.  Titrate to 2-3 BMs per day. (Decided to hold  off on rifaximin d/t cost) -24-hour urine for copper (low ceruloplasmin) -Rpt LVP 9/6 -Appreciate ID consultation. -She would need long-term SBP prophylaxis with Cipro 500 mg/day -Outpt appt with Dawn Drazek.    Carmell Austria, MD [facility] [phone]

## 2021-12-23 NOTE — Progress Notes (Addendum)
PROGRESS NOTE        PATIENT DETAILS Name: Anna Garcia Age: 41 y.o. Sex: female Date of Birth: 1980/09/21 Admit Date: 12/20/2021 Admitting Physician Evalee Mutton Kristeen Mans, MD PCP:Day, [name], MD  Brief Summary: Patient is a 41 y.o.  female with history of DM-1, gastroparesis, chronic pain syndrome/fibromyalgia, HTN, chronic urinary retention-self caths at home-who was just discharged from this facility on 8/29 after being treated for DKA/AKI/volume overload-presented back to the hospital with anasarca-3+ pitting edema in the lower extremity up to the thighs.  Significant events: 8/22-8/29>> hospitalization for DKA/AKI-found to have liver cirrhosis 8/31>> admit for anasarca/significant lower extremity edema. 9/01>> IV albumin started-midodrine dosage escalated.  Significant studies: 2/23>>Hbs Ag, HCV YW:VPXTGGYI 8/24>> bilateral lower extremity Doppler: No DVT. 8/28>>ANA/RA/Anti mitrochondrial Ab/Anti smooth muscle RS:WNIOEVOJ 8/26>> Echo: EF-60-65%. 8/31>> CXR: No PNA 8/31>> CT chest/abdomen/pelvis: No PE, liver cirrhosis, prominent small bowel wall thickening, moderate volume ascites.  Generalized subcutaneous edema. 9/01>> UA: No proteinuria.  Significant microbiology data: 9/01>>1/2 blood culture: Gram-positive cocci in chains 9/01>>ascites fluid culture:pending  Procedures: 9/01>> diagnostic paracentesis-1 L (WBC 590 w 85% neutrophils)  Consults: GI, ID  Subjective: Developed some confusion last night.  Pleasant this morning-answers simple questions appropriately.  Some mild tremors.  Claims she has not had an alcoholic drink for several weeks.  Objective: Vitals: Blood pressure 122/71, pulse 100, temperature 98.4 F (36.9 C), temperature source Oral, resp. rate 18, height '5\' 4"'$  (1.626 m), weight 84.7 kg, SpO2 100 %.   Exam: Gen Exam:Alert awake-not in any distress HEENT:atraumatic, normocephalic Chest: B/L clear to auscultation  anteriorly CVS:S1S2 regular Abdomen:soft non tender, non distended Extremities:++++ edema Neurology: Non focal Skin: no rash   Pertinent Labs/Radiology:    Latest Ref Rng & Units 12/23/2021    4:48 AM 12/22/2021    5:13 AM 12/21/2021    4:00 AM  CBC  WBC 4.0 - 10.5 K/uL 6.6  10.9  13.5   Hemoglobin 12.0 - 15.0 g/dL 7.2  9.2  9.1   Hematocrit 36.0 - 46.0 % 21.8  28.3  27.6   Platelets 150 - 400 K/uL 157  222  222     Lab Results  Component Value Date   NA 140 12/23/2021   K 3.0 (L) 12/23/2021   CL 106 12/23/2021   CO2 23 12/23/2021      Assessment/Plan: Anasarca: Continues to have significant volume overload-with edema up to her upper thighs/arms-BP soft but stable-limits further increase in dosage of Aldactone/Lasix.  Continue midodrine/IV albumin.  -3.3 L so far, weight down to 186.7 pounds (189.6 pounds on admission)  Streptococcus bacteremia/SBP: Continue IV Rocephin-appreciate ID input.  Await repeat cultures.  Cellulitis right foot: Improved-continue IV antibiotics.  AKI: Mild rise in creatinine overnight-remains significantly volume overloaded-cautiously continue with diuretics/albumin/midodrine.  Follow renal function closely.  Acute metabolic encephalopathy versus delirium: Unclear etiology-ammonia levels not significantly elevated-?  From narcotics.  Claims she has not had an alcoholic beverage for several weeks.  Start low-dose lactulose-start Ativan per CIWA protocol and follow.  Liver cirrhosis: Diagnosed recently on imaging studies-hepatitis serology/autoimmune work-up negative.  Recent EGD June 2023 did not show any obvious varices.  GI following-thought to have NASH-but being ruled out for Wilson's disease.  Macrocytic anemia: Drop in hemoglobin overnight-no evidence of GI bleeding.  Watch closely.  Repeat CBC tomorrow.  Recent folate/B12 levels in June stable.  History of esophageal stricture: S/p dilatation June 2023.  Denies any vomiting or dysphagia  symptoms.  History of diabetic gastroparesis: No vomiting-appears stable-continue Reglan.  Encourage small portion meals.  DM-1 (A1c 7.6 on 7/30): CBG stable-continue Semglee 12 units twice daily and SSI.  Follow/optimize.   Recent Labs    12/22/21 1622 12/22/21 2101 12/23/21 0833  GLUCAP 200* 82 83     HLD: Continue statin  Moderate persistent asthma: Not in flare-continue bronchodilators.  History of chronic urinary retention-s/p self-catheterization at home  Chronic pain/fibromyalgia/neuropathy: Continue Neurontin-asking for more narcotics-given soft blood pressure-we will continue with current regimen.  Obesity Estimated body mass index is 32.05 kg/m as calculated from the following:   Height as of this encounter: '5\' 4"'$  (1.626 m).   Weight as of this encounter: 84.7 kg.   Code status:   Code Status: Full Code   DVT Prophylaxis: enoxaparin (LOVENOX) injection 40 mg Start: 12/21/21 1000   Family Communication: None at bedside   Disposition Plan: Status is: Observation The patient will require care spanning > 2 midnights and should be moved to inpatient because: Severe anasarca having difficulty ambulating-Will need inpatient optimization of her volume status before consideration of discharge.   Planned Discharge Destination:Home sometime next week.   Diet: Diet Order             Diet heart healthy/carb modified Room service appropriate? Yes; Fluid consistency: Thin; Fluid restriction: 1200 mL Fluid  Diet effective now                     Antimicrobial agents: Anti-infectives (From admission, onward)    Start     Dose/Rate Route Frequency Ordered Stop   12/22/21 0800  cefTRIAXone (ROCEPHIN) 2 g in sodium chloride 0.9 % 100 mL IVPB        2 g 200 mL/hr over 30 Minutes Intravenous Every 24 hours 12/22/21 0236     12/21/21 2100  cefTRIAXone (ROCEPHIN) 1 g in sodium chloride 0.9 % 100 mL IVPB  Status:  Discontinued        1 g 200 mL/hr over 30 Minutes  Intravenous Every 24 hours 12/21/21 0055 12/22/21 0236   12/21/21 0030  cefTRIAXone (ROCEPHIN) 1 g in sodium chloride 0.9 % 100 mL IVPB  Status:  Discontinued        1 g 200 mL/hr over 30 Minutes Intravenous  Once 12/21/21 0015 12/21/21 0055   12/20/21 2145  cefTRIAXone (ROCEPHIN) 1 g in sodium chloride 0.9 % 100 mL IVPB  Status:  Discontinued        1 g 200 mL/hr over 30 Minutes Intravenous  Once 12/20/21 2139 12/20/21 2214   12/20/21 0000  cephALEXin (KEFLEX) 500 MG capsule  Status:  Discontinued        500 mg Oral 4 times daily 12/20/21 2319 12/21/21         MEDICATIONS: Scheduled Meds:  dicyclomine  10 mg Oral TID AC & HS   enoxaparin (LOVENOX) injection  40 mg Subcutaneous Q24H   famotidine  40 mg Oral BID   folic acid  1 mg Oral Daily   furosemide  40 mg Oral Daily   gabapentin  300 mg Oral TID   insulin aspart  0-5 Units Subcutaneous QHS   insulin aspart  0-9 Units Subcutaneous TID WC   insulin glargine-yfgn  12 Units Subcutaneous BID   lactulose  10 g Oral BID   lipase/protease/amylase  36,000 Units Oral TID WC   metoCLOPramide  10 mg Oral TID AC   midodrine  10 mg Oral TID WC   mometasone-formoterol  2 puff Inhalation BID   montelukast  10 mg Oral QHS   multivitamin with minerals  1 tablet Oral Daily   potassium chloride  40 mEq Oral Q4H   rosuvastatin  20 mg Oral QHS   sodium chloride flush  3 mL Intravenous Q12H   spironolactone  100 mg Oral Daily   thiamine  100 mg Oral Daily   Or   thiamine  100 mg Intravenous Daily   triamcinolone   Topical BID   Continuous Infusions:  albumin human 25 g (12/22/21 2217)   cefTRIAXone (ROCEPHIN)  IV 2 g (12/23/21 0836)   PRN Meds:.acetaminophen, albuterol, loperamide, LORazepam **OR** LORazepam, naLOXone (NARCAN)  injection, oxyCODONE   I have personally reviewed following labs and imaging studies  LABORATORY DATA: CBC: Recent Labs  Lab 12/18/21 0553 12/20/21 1626 12/20/21 1709 12/21/21 0400 12/22/21 0513  12/23/21 0448  WBC 9.1 12.6*  --  13.5* 10.9* 6.6  NEUTROABS  --  8.5*  --   --   --   --   HGB 9.1* 9.3* 10.9* 9.1* 9.2* 7.2*  HCT 27.6* 27.8* 32.0* 27.6* 28.3* 21.8*  MCV 108.7* 105.3*  --  108.2* 107.2* 105.8*  PLT 231 219  --  222 222 157     Basic Metabolic Panel: Recent Labs  Lab 12/18/21 0553 12/20/21 1626 12/20/21 1709 12/21/21 0400 12/22/21 0513 12/23/21 0448  NA 142 133* 132* 131* 132* 140  K 3.0* 3.4* 3.3* 3.6 3.4* 3.0*  CL 108 100 100 101 100 106  CO2 23 19*  --  20* 20* 23  GLUCOSE 99 238* 229* 185* 176* 112*  BUN '13 8 7 8 9 7  '$ CREATININE 1.20* 1.23* 1.00 1.22* 1.29* 1.33*  CALCIUM 8.8* 8.6*  --  8.4* 8.8* 9.1  MG  --   --   --   --   --  1.9     GFR: Estimated Creatinine Clearance: 59.2 mL/min (A) (by C-G formula based on SCr of 1.33 mg/dL (H)).  Liver Function Tests: Recent Labs  Lab 12/18/21 0553 12/20/21 1626 12/21/21 0400 12/22/21 0513 12/23/21 0448  AST 49* 39 37 35 31  ALT 33 '30 28 25 18  '$ ALKPHOS 190* 191* 189* 169* 129*  BILITOT 1.3* 1.6* 1.8* 1.7* 1.1  PROT 6.3* 5.6* 5.7* 6.2* 5.8*  ALBUMIN 2.8* 2.3* 2.3* 2.8* 3.3*    No results for input(s): "LIPASE", "AMYLASE" in the last 168 hours. Recent Labs  Lab 12/23/21 0448  AMMONIA 37*    Coagulation Profile: No results for input(s): "INR", "PROTIME" in the last 168 hours.  Cardiac Enzymes: No results for input(s): "CKTOTAL", "CKMB", "CKMBINDEX", "TROPONINI" in the last 168 hours.  BNP (last 3 results) No results for input(s): "PROBNP" in the last 8760 hours.  Lipid Profile: No results for input(s): "CHOL", "HDL", "LDLCALC", "TRIG", "CHOLHDL", "LDLDIRECT" in the last 72 hours.  Thyroid Function Tests: No results for input(s): "TSH", "T4TOTAL", "FREET4", "T3FREE", "THYROIDAB" in the last 72 hours.  Anemia Panel: Recent Labs    12/22/21 1816  FERRITIN 110  TIBC 120*  IRON 35    Urine analysis:    Component Value Date/Time   COLORURINE YELLOW 12/21/2021 0809    APPEARANCEUR HAZY (A) 12/21/2021 0809   LABSPEC 1.027 12/21/2021 0809   PHURINE 6.0 12/21/2021 0809   GLUCOSEU 50 (A) 12/21/2021 0809   HGBUR NEGATIVE 12/21/2021 0809   BILIRUBINUR  NEGATIVE 12/21/2021 0809   KETONESUR NEGATIVE 12/21/2021 0809   PROTEINUR NEGATIVE 12/21/2021 0809   UROBILINOGEN 0.2 04/11/2011 1437   NITRITE NEGATIVE 12/21/2021 0809   LEUKOCYTESUR SMALL (A) 12/21/2021 0809    Sepsis Labs: Lactic Acid, Venous    Component Value Date/Time   LATICACIDVEN 7.5 ([place]) 12/05/2021 0238    MICROBIOLOGY: Recent Results (from the past 240 hour(s))  Blood culture (routine x 2)     Status: None (Preliminary result)   Collection Time: 12/21/21  4:00 AM   Specimen: BLOOD  Result Value Ref Range Status   Specimen Description BLOOD SITE NOT SPECIFIED  Final   Special Requests   Final    BOTTLES DRAWN AEROBIC AND ANAEROBIC Blood Culture adequate volume   Culture  Setup Time   Final    GRAM POSITIVE COCCI IN CHAINS AEROBIC BOTTLE ONLY Organism ID to follow CRITICAL RESULT CALLED TO, READ BACK BY AND VERIFIED WITH: T RUDISILL,PHARMD'@[id number]'$  12/22/21 [place] Performed at [facility], [address] [place], [place] [address]    Culture GRAM POSITIVE COCCI  Final   Report Status PENDING  Incomplete  Blood Culture ID Panel (Reflexed)     Status: Abnormal   Collection Time: 12/21/21  4:00 AM  Result Value Ref Range Status   Enterococcus faecalis NOT DETECTED NOT DETECTED Final   Enterococcus Faecium NOT DETECTED NOT DETECTED Final   Listeria monocytogenes NOT DETECTED NOT DETECTED Final   Staphylococcus species NOT DETECTED NOT DETECTED Final   Staphylococcus aureus (BCID) NOT DETECTED NOT DETECTED Final   Staphylococcus epidermidis NOT DETECTED NOT DETECTED Final   Staphylococcus lugdunensis NOT DETECTED NOT DETECTED Final   Streptococcus species DETECTED (A) NOT DETECTED Final    Comment: Not Enterococcus species, Streptococcus agalactiae, Streptococcus pyogenes, or  Streptococcus pneumoniae. CRITICAL RESULT CALLED TO, READ BACK BY AND VERIFIED WITH: T RUDISILL,PHARMD'@0229'$  12/22/21 [employer]    Streptococcus agalactiae NOT DETECTED NOT DETECTED Final   Streptococcus pneumoniae NOT DETECTED NOT DETECTED Final   Streptococcus pyogenes NOT DETECTED NOT DETECTED Final   A.calcoaceticus-baumannii NOT DETECTED NOT DETECTED Final   Bacteroides fragilis NOT DETECTED NOT DETECTED Final   Enterobacterales NOT DETECTED NOT DETECTED Final   Enterobacter cloacae complex NOT DETECTED NOT DETECTED Final   Escherichia coli NOT DETECTED NOT DETECTED Final   Klebsiella aerogenes NOT DETECTED NOT DETECTED Final   Klebsiella oxytoca NOT DETECTED NOT DETECTED Final   Klebsiella pneumoniae NOT DETECTED NOT DETECTED Final   Proteus species NOT DETECTED NOT DETECTED Final   Salmonella species NOT DETECTED NOT DETECTED Final   Serratia marcescens NOT DETECTED NOT DETECTED Final   Haemophilus influenzae NOT DETECTED NOT DETECTED Final   Neisseria meningitidis NOT DETECTED NOT DETECTED Final   Pseudomonas aeruginosa NOT DETECTED NOT DETECTED Final   Stenotrophomonas maltophilia NOT DETECTED NOT DETECTED Final   Candida albicans NOT DETECTED NOT DETECTED Final   Candida auris NOT DETECTED NOT DETECTED Final   Candida glabrata NOT DETECTED NOT DETECTED Final   Candida krusei NOT DETECTED NOT DETECTED Final   Candida parapsilosis NOT DETECTED NOT DETECTED Final   Candida tropicalis NOT DETECTED NOT DETECTED Final   Cryptococcus neoformans/gattii NOT DETECTED NOT DETECTED Final    Comment: Performed at [facility], [address] [place], [place] [address]  Blood culture (routine x 2)     Status: None (Preliminary result)   Collection Time: 12/21/21  4:08 AM   Specimen: BLOOD  Result Value Ref Range Status   Specimen Description  BLOOD SITE NOT SPECIFIED  Final   Special Requests   Final    BOTTLES DRAWN AEROBIC ONLY Blood Culture results may not be optimal due to an  inadequate volume of blood received in culture bottles   Culture   Final    NO GROWTH 1 DAY Performed at [facility], [place] [address] [place], [place] [address]    Report Status PENDING  Incomplete  Culture, body fluid w Gram Stain-bottle     Status: None (Preliminary result)   Collection Time: 12/21/21  3:21 PM   Specimen: Fluid  Result Value Ref Range Status   Specimen Description FLUID PERITONEAL  Final   Special Requests BOTTLES DRAWN AEROBIC AND ANAEROBIC  Final   Culture   Final    NO GROWTH < 24 HOURS Performed at [facility], [place] [address], [place], [place] [address]    Report Status PENDING  Incomplete  Gram stain     Status: None   Collection Time: 12/21/21  3:21 PM   Specimen: Fluid  Result Value Ref Range Status   Specimen Description FLUID PERITONEAL  Final   Special Requests NONE  Final   Gram Stain   Final    WBC PRESENT,BOTH PMN AND MONONUCLEAR NO ORGANISMS SEEN CYTOSPIN SMEAR Performed at [facility], [address] [place], [place] [address]    Report Status 12/21/2021 FINAL  Final    RADIOLOGY STUDIES/RESULTS: IR Paracentesis  Result Date: 12/21/2021 INDICATION: Patient with history of cirrhosis, previous ultrasound showed ascites. Request for therapeutic and diagnostic paracentesis up to 1 L. EXAM: ULTRASOUND GUIDED DIAGNOSTIC AND THERAPEUTIC PARACENTESIS MEDICATIONS: 10 mL 1% lidocaine COMPLICATIONS: None immediate. PROCEDURE: Informed written consent was obtained from the patient after a discussion of the risks, benefits and alternatives to treatment. A timeout was performed prior to the initiation of the procedure. Initial ultrasound scanning demonstrates a small amount of ascites within the right lower abdominal quadrant. The right lower abdomen was prepped and draped in the usual sterile fashion. 1% lidocaine was used for local anesthesia. Following this, a 19 gauge, 7-cm, Yueh catheter was introduced. An ultrasound image was saved for  documentation purposes. The paracentesis was performed. The catheter was removed and a dressing was applied. The patient tolerated the procedure well without immediate post procedural complication. FINDINGS: A total of approximately 1 L of hazy yellow fluid was removed. Samples were sent to the laboratory as requested by the clinical team. IMPRESSION: Successful ultrasound-guided paracentesis yielding 1 L of peritoneal fluid. Read by: Durenda Guthrie, PA-C PLAN: If the patient eventually requires >/=2 paracenteses in a 30 day period, candidacy for formal evaluation by the [facility] will be assessed. Michaelle Birks, MD [facility] Electronically Signed   By: Michaelle Birks M.D.   On: 12/21/2021 15:39   US Abdomen Limited  Result Date: 12/21/2021 CLINICAL DATA:  Ascites EXAM: LIMITED ABDOMEN ULTRASOUND FOR ASCITES TECHNIQUE: Limited ultrasound survey for ascites was performed in all four abdominal quadrants. COMPARISON:  CT abdomen and pelvis 12/21/2018. FINDINGS: Moderate volume ascites demonstrated within all 4 quadrants. IMPRESSION: Moderate volume abdominal ascites. Electronically Signed   By: Lovey Newcomer M.D.   On: 12/21/2021 11:59     LOS: 2 days   Oren Binet, MD  [facility]    To contact the attending provider between 7A-7P or the covering provider during after hours 7P-7A, please log into the web site [email or web address] and access  using universal [place] password for that web site. If you do not have the password, please call the hospital operator.  12/23/2021, 10:44 AM

## 2021-12-23 NOTE — Plan of Care (Signed)
Id brief update   The 9/01 only 1 set is growing strep angi (lower risk of endocarditis causing)  Repeat 9/02 bcx is in process  Abd pelv ct with contrast is without abscess   A/p Strep angi bacteremia possible contaminant Sbp by cell count (cx ngtd)  Wbc improving   F/u final 9/02 and 9/01 cx Continue ceftriaxone -- if 9/02 and 9/01 blood cx remains as is, will finish out 7 day ceftriaxone total for sbp Discussed with primary team

## 2021-12-24 ENCOUNTER — Inpatient Hospital Stay (HOSPITAL_COMMUNITY): Payer: Medicare Other

## 2021-12-24 ENCOUNTER — Inpatient Hospital Stay (HOSPITAL_COMMUNITY)
Admit: 2021-12-24 | Discharge: 2021-12-24 | Disposition: A | Discharge status: Home / Self Care | Payer: Medicare Other | Attending: Internal Medicine | Admitting: Internal Medicine

## 2021-12-24 DIAGNOSIS — M7989 Other specified soft tissue disorders: Secondary | ICD-10-CM | POA: Diagnosis not present

## 2021-12-24 DIAGNOSIS — R601 Generalized edema: Secondary | ICD-10-CM | POA: Diagnosis not present

## 2021-12-24 DIAGNOSIS — A419 Sepsis, unspecified organism: Secondary | ICD-10-CM

## 2021-12-24 DIAGNOSIS — R4182 Altered mental status, unspecified: Secondary | ICD-10-CM | POA: Diagnosis not present

## 2021-12-24 DIAGNOSIS — K7460 Unspecified cirrhosis of liver: Secondary | ICD-10-CM | POA: Diagnosis not present

## 2021-12-24 DIAGNOSIS — K652 Spontaneous bacterial peritonitis: Secondary | ICD-10-CM

## 2021-12-24 DIAGNOSIS — G9340 Encephalopathy, unspecified: Secondary | ICD-10-CM

## 2021-12-24 DIAGNOSIS — R188 Other ascites: Secondary | ICD-10-CM | POA: Diagnosis not present

## 2021-12-24 LAB — CBC
HCT: 21.2 % — ABNORMAL LOW (ref 36.0–46.0)
Hemoglobin: 6.8 g/dL — CL (ref 12.0–15.0)
MCH: 35.1 pg — ABNORMAL HIGH (ref 26.0–34.0)
MCHC: 32.1 g/dL (ref 30.0–36.0)
MCV: 109.3 fL — ABNORMAL HIGH (ref 80.0–100.0)
Platelets: 147 10*3/uL — ABNORMAL LOW (ref 150–400)
RBC: 1.94 MIL/uL — ABNORMAL LOW (ref 3.87–5.11)
RDW: 18.3 % — ABNORMAL HIGH (ref 11.5–15.5)
WBC: 5.1 10*3/uL (ref 4.0–10.5)
nRBC: 0 % (ref 0.0–0.2)

## 2021-12-24 LAB — BASIC METABOLIC PANEL
Anion gap: 11 (ref 5–15)
BUN: 6 mg/dL (ref 6–20)
CO2: 25 mmol/L (ref 22–32)
Calcium: 9.6 mg/dL (ref 8.9–10.3)
Chloride: 109 mmol/L (ref 98–111)
Creatinine, Ser: 1.33 mg/dL — ABNORMAL HIGH (ref 0.44–1.00)
GFR, Estimated: 52 mL/min — ABNORMAL LOW (ref >60)
Glucose, Bld: 79 mg/dL (ref 70–99)
Potassium: 2.9 mmol/L — ABNORMAL LOW (ref 3.5–5.1)
Sodium: 145 mmol/L (ref 135–145)

## 2021-12-24 LAB — HEMOGLOBIN AND HEMATOCRIT, BLOOD
HCT: 26.5 % — ABNORMAL LOW (ref 36.0–46.0)
Hemoglobin: 8.5 g/dL — ABNORMAL LOW (ref 12.0–15.0)

## 2021-12-24 LAB — GLUCOSE, CAPILLARY
Glucose-Capillary: 133 mg/dL — ABNORMAL HIGH (ref 70–99)
Glucose-Capillary: 46 mg/dL — ABNORMAL LOW (ref 70–99)
Glucose-Capillary: 68 mg/dL — ABNORMAL LOW (ref 70–99)
Glucose-Capillary: 73 mg/dL (ref 70–99)
Glucose-Capillary: 76 mg/dL (ref 70–99)
Glucose-Capillary: 83 mg/dL (ref 70–99)
Glucose-Capillary: 90 mg/dL (ref 70–99)

## 2021-12-24 LAB — PREPARE RBC (CROSSMATCH)

## 2021-12-24 MED ORDER — INSULIN GLARGINE-YFGN 100 UNIT/ML ~~LOC~~ SOLN
6.0000 [IU] | Freq: Two times a day (BID) | SUBCUTANEOUS | Status: DC
  Filled 2021-12-24: qty 0.06

## 2021-12-24 MED ORDER — SODIUM CHLORIDE 0.9% IV SOLUTION: Freq: Once | INTRAVENOUS | Status: AC

## 2021-12-24 MED ORDER — INSULIN ASPART 100 UNIT/ML IJ SOLN
0.0000 [IU] | Freq: Three times a day (TID) | INTRAMUSCULAR | Status: DC
  Administered 2021-12-25: 3 [IU] via SUBCUTANEOUS
  Administered 2021-12-26 (×2): 1 [IU] via SUBCUTANEOUS
  Administered 2021-12-27: 5 [IU] via SUBCUTANEOUS
  Administered 2021-12-27: 2 [IU] via SUBCUTANEOUS
  Administered 2021-12-27 – 2021-12-28 (×2): 3 [IU] via SUBCUTANEOUS
  Administered 2021-12-28: 6 [IU] via SUBCUTANEOUS
  Administered 2021-12-28: 3 [IU] via SUBCUTANEOUS
  Administered 2021-12-29: 5 [IU] via SUBCUTANEOUS
  Administered 2021-12-29: 3 [IU] via SUBCUTANEOUS
  Administered 2021-12-29: 2 [IU] via SUBCUTANEOUS
  Administered 2021-12-30 (×2): 5 [IU] via SUBCUTANEOUS

## 2021-12-24 MED ORDER — OXYCODONE HCL 5 MG PO TABS: 5.0000 mg | ORAL_TABLET | ORAL | Status: DC | PRN

## 2021-12-24 MED ORDER — DIPHENHYDRAMINE HCL 50 MG/ML IJ SOLN
25.0000 mg | Freq: Once | INTRAMUSCULAR | Status: AC
  Administered 2021-12-24: 25 mg via INTRAVENOUS
  Filled 2021-12-24: qty 1

## 2021-12-24 MED ORDER — ORAL CARE MOUTH RINSE: 15.0000 mL | OROMUCOSAL | Status: DC | PRN

## 2021-12-24 MED ORDER — INSULIN GLARGINE-YFGN 100 UNIT/ML ~~LOC~~ SOLN
6.0000 [IU] | Freq: Two times a day (BID) | SUBCUTANEOUS | Status: DC
  Administered 2021-12-25 – 2021-12-26 (×3): 6 [IU] via SUBCUTANEOUS
  Filled 2021-12-24 (×6): qty 0.06

## 2021-12-24 MED ORDER — ACETAMINOPHEN 325 MG PO TABS
650.0000 mg | ORAL_TABLET | Freq: Once | ORAL | Status: AC
  Administered 2021-12-24: 650 mg via ORAL
  Filled 2021-12-24: qty 2

## 2021-12-24 MED ORDER — DEXTROSE-NACL 5-0.9 % IV SOLN: INTRAVENOUS | Status: DC

## 2021-12-24 MED ORDER — THIAMINE HCL 100 MG/ML IJ SOLN
500.0000 mg | Freq: Every day | INTRAMUSCULAR | Status: AC
  Administered 2021-12-24 – 2021-12-26 (×3): 500 mg via INTRAVENOUS
  Filled 2021-12-24 (×3): qty 6

## 2021-12-24 MED ORDER — MAGNESIUM SULFATE 2 GM/50ML IV SOLN
2.0000 g | Freq: Once | INTRAVENOUS | Status: AC
  Administered 2021-12-24: 2 g via INTRAVENOUS
  Filled 2021-12-24: qty 50

## 2021-12-24 MED ORDER — DEXTROSE 50 % IV SOLN
INTRAVENOUS | Status: AC
  Administered 2021-12-24: 50 mL
  Filled 2021-12-24: qty 50

## 2021-12-24 MED ORDER — POTASSIUM CHLORIDE CRYS ER 20 MEQ PO TBCR
40.0000 meq | EXTENDED_RELEASE_TABLET | ORAL | Status: AC
  Administered 2021-12-24 (×2): 40 meq via ORAL
  Filled 2021-12-24 (×2): qty 2

## 2021-12-24 NOTE — Procedures (Signed)
Patient Name: Anna Garcia  MRN: [id number]  Epilepsy Attending: Lora Havens  Referring Physician/Provider: Jonetta Osgood, MD  Date: 12/24/2021 Duration: 22.08 mins  Patient history: 41 year old female with altered mental status.  EEG to evaluate for seizure.  Level of alertness: Awake  AEDs during EEG study: None  Technical aspects: This EEG study was done with scalp electrodes positioned according to the 10-20 International system of electrode placement. Electrical activity was reviewed with band pass filter of 1-'70Hz'$ , sensitivity of 7 uV/mm, display speed of 64m/sec with a '60Hz'$  notched filter applied as appropriate. EEG data were recorded continuously and digitally stored.  Video monitoring was available and reviewed as appropriate.  Description: No posterior dominant rhythm was seen. EEG showed continuous generalized 3 to 6 Hz theta-delta slowing. Hyperventilation and photic stimulation were not performed.     ABNORMALITY - Continuous slow, generalized  IMPRESSION: This study is suggestive of moderate diffuse encephalopathy, nonspecific etiology. No seizures or epileptiform discharges were seen throughout the recording.  Cassandria Drew OBarbra Sarks

## 2021-12-24 NOTE — TOC Initial Note (Signed)
Transition of Care [facility]) - Initial/Assessment Note    Patient Details  Name: Anna Garcia MRN: [id number] Date of Birth: 1981-01-27  Transition of Care [facility]) CM/SW Contact:    Tom-Johnson, Renea Ee, RN Phone Number: 12/24/2021, 2:25 PM  Clinical Narrative:                  CM consulted for home health needs. CM went to speak with patient at bedside. Patient noted to be withdrawn, not wanting to speak. Has bilateral hand mittens on. CM asked patient if she could speak with [name] listed on contact and patient nodded yes.  Admitted for Liver Cirrhosis. Patient has bilateral pitting edema, on IV abx. CM called and spoke with [name] [phone]) patient's sister in-law. [name] states patient lives alone. Both parents lare deceased and has one brother. Currently on disability. Has a car but not driving at this time. Uses Medicaid transportation to and from appointments. RW and BSC ordered from [employer], [name] to deliver at bedside.  Recent referral was sent to [facility] for home health disciplines, [facility] notified of new referral and will start care at discharge, info on AVS.      Expected Discharge Plan: [place] Barriers to Discharge: Continued Medical Work up   Patient Goals and CMS Choice Patient states their goals for this hospitalization and ongoing recovery are:: To return home CMS Medicare.gov Compare Post Acute Care list provided to:: Patient Choice offered to / list presented to : Patient (Sister in-law, [name])  Expected Discharge Plan and Services Expected Discharge Plan: [place]   Discharge Planning Services: CM Consult Post Acute Care Choice: [place] arrangements for the past 2 months: Single Family Home, Apartment                 DME Arranged: Bedside commode, Walker rolling DME Agency: [employer] Date DME Agency Contacted: 12/24/21 Time DME Agency Contacted: 31 Representative spoke with at DME Agency: [name]  Arranged: PT, OT, RN, Disease Management, Nurse's Aide, Social Work [employer] Agency: [place] Date [place]: 12/24/21 Time [place]: 40 Representative spoke with at [place] Arrangements/Services Living arrangements for the past 2 months: [place], Apartment Lives with:: Self Patient language and need for interpreter reviewed:: Yes Do you feel safe going back to the place where you live?: Yes      Need for Family Participation in Patient Care: Yes (Comment) Care giver support system in place?: Yes (comment)   Criminal Activity/Legal Involvement Pertinent to Current Situation/Hospitalization: No - Comment as needed  Activities of Daily Living Home Assistive Devices/Equipment: None ADL Screening (condition at time of admission) Patient's cognitive ability adequate to safely complete daily activities?: Yes Is the patient deaf or have difficulty hearing?: No Does the patient have difficulty seeing, even when wearing glasses/contacts?: No Does the patient have difficulty concentrating, remembering, or making decisions?: No Patient able to express need for assistance with ADLs?: Yes Does the patient have difficulty dressing or bathing?: No Independently performs ADLs?: Yes (appropriate for developmental age) Communication: Independent Dressing (OT): Needs assistance Is this a change from baseline?: Change from baseline, expected to last <3days Grooming: Needs assistance Is this a change from baseline?: Change from baseline, expected to last <3 days Feeding: Independent Bathing: Needs assistance Is this a change from baseline?: Change from baseline, expected to last <3 days Toileting: Needs assistance Is this a change from baseline?: Change from  baseline, expected to last <3 days In/Out Bed: Needs assistance Is this a change from baseline?: Change from baseline, expected to last <3 days Walks in Home: Independent Does the  patient have difficulty walking or climbing stairs?: Yes Weakness of Legs: Both Weakness of Arms/Hands: None  Permission Sought/Granted Permission sought to share information with : Case Manager, Customer service manager, Family Supports Permission granted to share information with : Yes, Verbal Permission Granted              Emotional Assessment     Affect (typically observed): Withdrawn Orientation: : Oriented to Self Alcohol / Substance Use: Alcohol Use Psych Involvement: No (comment)  Admission diagnosis:  Anasarca [R60.1] Cirrhosis of liver with ascites, unspecified hepatic cirrhosis type ([place]) [K74.60, R18.8] Hypervolemia, unspecified hypervolemia type [E87.70] Sepsis, due to unspecified organism, unspecified whether acute organ dysfunction present [facility]) [A41.9] Patient Active Problem List   Diagnosis Date Noted   Cirrhosis of liver with ascites, unspecified hepatic cirrhosis type ([place]) 12/21/2021   Hypotension 12/21/2021   Cellulitis of foot, right 12/21/2021   Generalized weakness 12/21/2021   Alcohol use 12/21/2021   Chronic urinary retention 12/21/2021   Anasarca 12/21/2021   Acute confusional state    Macrocytic anemia 12/05/2021   Enteritis 12/05/2021   Chronic pain syndrome 12/05/2021   Refractory nausea and vomiting 12/04/2021   High anion gap metabolic acidosis 73/41/9379   GERD (gastroesophageal reflux disease) 11/19/2021   Moderate persistent asthma 11/19/2021   Hypomagnesemia 09/23/2021   Hypophosphatemia 09/23/2021   Fever 05/27/2021   Left genital labial abscess 05/25/2021   Sinus tachycardia    Labial swelling: Left 05/24/2021   Vulvar abscess 05/24/2021   Elevated brain natriuretic peptide (BNP) level 05/22/2021   Overweight (BMI 25.0-29.9) 05/22/2021   Hypokalemia 05/21/2021   Fibromyalgia 10/12/2020   CKD stage G3a/A3, GFR 45-59 and albumin creatinine ratio >300 mg/g ([place]) 10/12/2020   DKA (diabetic ketoacidosis) ([place]) 07/05/2020    SIRS (systemic inflammatory response syndrome) ([place]) 07/05/2020   Asthma 07/05/2020   AKI (acute kidney injury) ([place]) 07/05/2020   Transaminitis 07/05/2020   Hypertension    Gastroparesis due to secondary diabetes ([place])    Type 1 diabetes mellitus ([place]) 06/19/2006   NEUROPATHY, PERIPHERAL 06/19/2006   ACNE 06/19/2006   PCP:  Day, [name], MD Pharmacy:   [place], [place] - [address] [place] [address] Phone: [phone] Fax: [phone]     Social Determinants of Health (SDOH) Interventions    Readmission Risk Interventions    12/14/2021   10:09 AM 09/24/2021    1:14 PM  Readmission Risk Prevention Plan  Transportation Screening Complete   PCP or Specialist Appt within 3-5 Days Complete   HRI or Home Care Consult Complete Complete  Social Work Consult for [place] Planning/Counseling Complete Complete  Palliative Care Screening Not Applicable Not Applicable  Medication Review Press photographer) Complete

## 2021-12-24 NOTE — Care Management Important Message (Signed)
Important Message  Patient Details  Name: Anna Garcia MRN: [id number] Date of Birth: November 07, 1980   Medicare Important Message Given:  Yes     Orbie Pyo 12/24/2021, 12:25 PM

## 2021-12-24 NOTE — Progress Notes (Signed)
Upper extremity venous bilateral study completed.  Preliminary results relayed to [place], [place].  See CV Proc for preliminary results report.   Darlin Coco, RDMS, RVT

## 2021-12-24 NOTE — Progress Notes (Signed)
PT Cancellation Note  Patient Details Name: Anna Garcia MRN: [id number] DOB: June 30, 1980   Cancelled Treatment:    Reason Eval/Treat Not Completed: Medical issues which prohibited therapy  Per RN, pt in restraints, given Ativan, and is due to get 1 unit of blood. PT deferred at this time.    Phil Campbell  Office [phone]  Rexanne Mano 12/24/2021, 11:40 AM

## 2021-12-24 NOTE — Progress Notes (Signed)
  Mobility Specialist Criteria Algorithm Info.  Pt not appropriate at this time given level of complexity, physical assist, and/or precautions as advised by RN. MS to hold for now, will follow up as time permits.  Anna Garcia, [place], [place]  TRZNB:[phone] Office: [phone]

## 2021-12-24 NOTE — Progress Notes (Addendum)
[facility] ROUNDING NOTE   Subjective: Patient was altered this morning when I saw her.  Initially she was somnolent and sleeping, but she would become intermittently agitated during her conversations.  She was disoriented and alert to self only.  She was intermittently tearful.  She was unable to recall why she was in the hospital. Per nursing notes, the patient was having hallucinations and was out of bed and had pulled out her IV line resulting in her having to be put in restraints.   Objective: Vital signs in last 24 hours: Temp:  [97.5 F (36.4 C)-98.6 F (37 C)] 98.6 F (37 C) (09/04 1446) Pulse Rate:  [78-123] 90 (09/04 1446) Resp:  [16-20] 16 (09/04 1446) BP: (82-110)/(50-80) 95/64 (09/04 1446) SpO2:  [96 %-100 %] 98 % (09/04 1446) Weight:  [81.1 kg] 81.1 kg (09/04 0507) Last BM Date : 12/21/21 General: NAD, chronically ill-appearing pale Caucasian female Lungs:  CTA b/l, no w/r/r Heart:  RRR, no m/r/g Abdomen:  Soft, diffuse tenderness to palpation, ND, +BS Ext: Anasarca, erythematous changes to bilateral lower extremities Patient is diffusely tender to light palpation throughout Psych: Oriented to self only, intermittently somnolent and agitated, emotional/tearful   Intake/Output from previous day: 09/03 0701 - 09/04 0700 In: 530 [P.O.:480; IV Piggyback:50] Out: 4315 [Urine:4125] Intake/Output this shift: Total I/O In: 571 [P.O.:60; I.V.:12.7; Blood:315; IV Piggyback:183.2] Out: 4008 [Urine:2300]   Lab Results: Recent Labs    12/22/21 0513 12/23/21 0448 12/24/21 0258  WBC 10.9* 6.6 5.1  HGB 9.2* 7.2* 6.8*  PLT 222 157 147*  MCV 107.2* 105.8* 109.3*   BMET Recent Labs    12/22/21 0513 12/23/21 0448 12/24/21 0258  NA 132* 140 145  K 3.4* 3.0* 2.9*  CL 100 106 109  CO2 20* 23 25  GLUCOSE 176* 112* 79  BUN '9 7 6  '$ CREATININE 1.29* 1.33* 1.33*  CALCIUM 8.8* 9.1 9.6   LFT Recent Labs    12/22/21 0513 12/23/21 0448  PROT 6.2* 5.8*   ALBUMIN 2.8* 3.3*  AST 35 31  ALT 25 18  ALKPHOS 169* 129*  BILITOT 1.7* 1.1   PT/INR No results for input(s): "INR" in the last 72 hours.    Imaging/Other results: EEG adult  Result Date: 12/24/2021 Lora Havens, MD     12/24/2021  3:34 PM Patient Name: Anna Garcia MRN: [id number] Epilepsy Attending: Lora Havens Referring Physician/Provider: Jonetta Osgood, MD Date: 12/24/2021 Duration: 22.08 mins Patient history: 41 year old female with altered mental status.  EEG to evaluate for seizure. Level of alertness: Awake AEDs during EEG study: None Technical aspects: This EEG study was done with scalp electrodes positioned according to the 10-20 International system of electrode placement. Electrical activity was reviewed with band pass filter of 1-'70Hz'$ , sensitivity of 7 uV/mm, display speed of 54m/sec with a '60Hz'$  notched filter applied as appropriate. EEG data were recorded continuously and digitally stored.  Video monitoring was available and reviewed as appropriate. Description: No posterior dominant rhythm was seen. EEG showed continuous generalized 3 to 6 Hz theta-delta slowing. Hyperventilation and photic stimulation were not performed.   ABNORMALITY - Continuous slow, generalized IMPRESSION: This study is suggestive of moderate diffuse encephalopathy, nonspecific etiology. No seizures or epileptiform discharges were seen throughout the recording. Priyanka OBarbra Sarks     Assessment and Plan:  41year old female with longstanding type 1 diabetes with gastroparesis with newly diagnosed cirrhosis, with anasarca, ascites and SBP.  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 She had reported to me having some mild brain fog recently, and was started on lactulose yesterday, but she had significant mental status changes overnight characterized  by hallucinations and agitation.  She was tachycardic overnight up to 123.  Waxing and waning mental status changes atypical for withdrawal, but hallucinations and agitation atypical for hepatic encephalopathy. She has had a drop in her hemoglobin over the past few days, but without any overt GI bleeding.  An EGD in June showed no varices, and a mild esophageal stricture, dilated  Cirrhosis, unclear etiology (NASH,?Alcohol, [place]) - Low ceruloplasmin (9), 24-hour urine copper level pending - We will obtain PeTH level to assess for surreptitious alcohol use  Ascites, SBP, anasarca - Continue albumin 1 more day, Rocephin for 7 days, midodrine - Low-sodium diet (less than 2 g/day) - Patient will need long-term SBP prophylaxis following hospitalization with Cipro 500 mg daily - Continue Lasix 40 mg, Aldactone 100 mg  Encephalopathy, unclear etiology (hepatic encephalopathy, alcohol withdrawal, [place]?) - Continue lactulose 3 times daily, titrate to 2-3 bowel movements per day - Agree with EEG, consider neurology consult if no improvement and no etiology - Ativan sparingly for agitation - PeTH as above  Anemia - No overt GI bleeding - Continue to monitor - No plans for endoscopy at this time  Streptococcal bacteremia - Rocephin x7 days per ID    Daryel November, MD  12/24/2021, 3:57 PM [facility]
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
## 2021-12-24 NOTE — Progress Notes (Signed)
   12/24/21 0135  Provider Notification  Provider Name/Title Dr. Marlowe Sax  Date Provider Notified 12/24/21  Time Provider Notified [phone]  Method of Notification Page  Notification Reason Change in status  Provider response See new orders   Bladder scan showed 936 cc urine in the bladder.  Dr. Marlowe Sax made aware.  Order received for I & O Catheterization.  Order implemented and 1400 cc urine removed.  Will continue to monitor patient.  Earleen Reaper RN

## 2021-12-24 NOTE — Progress Notes (Signed)
EEG complete - results pending 

## 2021-12-24 NOTE — Progress Notes (Signed)
Hypoglycemic Event  CBG: 46  Treatment: D50 50 mL (25 gm)  Symptoms: Pale and Hungry  Follow-up CBG: Time: 0800 CBG Result: 133  Possible Reasons for Event: Inadequate meal intake  Comments/MD notified: MD Debbra Riding

## 2021-12-24 NOTE — Progress Notes (Signed)
Hypoglycemic Event  CBG: 68  Treatment: 4 oz juice/soda  Symptoms: Pale  Follow-up CBG: Time: 1312 CBG Result: 76  Possible Reasons for Event: Inadequate meal intake  Comments/MD notified: MD Debbra Riding

## 2021-12-24 NOTE — Progress Notes (Addendum)
PROGRESS NOTE        PATIENT DETAILS Name: Anna Garcia Age: 41 y.o. Sex: female Date of Birth: 1981/03/22 Admit Date: 12/20/2021 Admitting Physician Evalee Mutton Kristeen Mans, MD PCP:Day, [name], MD  Brief Summary: Patient is a 41 y.o.  female with history of DM-1, gastroparesis, chronic pain syndrome/fibromyalgia, HTN, chronic urinary retention-self caths at home-who was just discharged from this facility on 8/29 after being treated for DKA/AKI/volume overload-presented back to the hospital with anasarca-3+ pitting edema in the lower extremity up to the thighs.  Significant events: 8/22-8/29>> hospitalization for DKA/AKI-found to have liver cirrhosis 8/31>> admit for anasarca/significant lower extremity edema. 9/01>> IV albumin started-midodrine dosage escalated. 9/03>> hallucinating-confused.  Significant studies: 2/23>>Hbs Ag, HCV BH:ALPFXTKW 8/24>> bilateral lower extremity Doppler: No DVT. 8/28>>ANA/RA/Anti mitrochondrial Ab/Anti smooth muscle IO:[id number] 8/26>> Echo: EF-60-65%. 8/31>> CXR: No PNA 8/31>> CT chest/abdomen/pelvis: No PE, liver cirrhosis, prominent small bowel wall thickening, moderate volume ascites.  Generalized subcutaneous edema. 9/01>> UA: No proteinuria.  Significant microbiology data: 9/01>>1/2 blood culture: Streptococcus anginosus 9/01>>ascites fluid culture: No growth 9/02>> blood culture: No growth.  Procedures: 9/01>> diagnostic paracentesis-1 L (WBC 590 w 85% neutrophils)  Consults: GI, ID  Subjective: Developed some confusion last night.  Pleasant this morning-answers simple questions appropriately.  Some mild tremors.  Claims she has not had an alcoholic drink for several weeks.  Objective: Vitals: Blood pressure 93/64, pulse 98, temperature 97.6 F (36.4 C), temperature source Oral, resp. rate 18, height '5\' 4"'$  (1.626 m), weight 81.1 kg, SpO2 99 %.   Exam: Gen Exam: Somewhat confused but able to answer simple  questions.  Not in any distress. HEENT:atraumatic, normocephalic Chest: B/L clear to auscultation anteriorly CVS:S1S2 regular Abdomen:soft non tender, non distended Extremities:++ Edema. Neurology: Non focal Skin: no rash   Pertinent Labs/Radiology:    Latest Ref Rng & Units 12/24/2021    2:58 AM 12/23/2021    4:48 AM 12/22/2021    5:13 AM  CBC  WBC 4.0 - 10.5 K/uL 5.1  6.6  10.9   Hemoglobin 12.0 - 15.0 g/dL 6.8  7.2  9.2   Hematocrit 36.0 - 46.0 % 21.2  21.8  28.3   Platelets 150 - 400 K/uL 147  157  222     Lab Results  Component Value Date   NA 145 12/24/2021   K 2.9 (L) 12/24/2021   CL 109 12/24/2021   CO2 25 12/24/2021      Assessment/Plan: Anasarca: Improvement in volume status-decrease in lower extremity edema today, -7.2 L so far, weight down to 178.7 pounds (189.6 on admission).  Continue Aldactone/furosemide with albumin/midodrine.   Streptococcus bacteremia/SBP: Continue IV Rocephin-x7 days-ID/GI following.  Cellulitis right foot: Improved-continue IV antibiotics.  AKI: Minimally elevated creatinine-since remains volume overloaded-continue diuretics along with albumin/midodrine.  Follow electrolytes closely.    Hypokalemia: Replete and recheck.  Check magnesium with a.m. labs.  Acute metabolic encephalopathy versus delirium: Unclear etiology-ammonia levels not significantly elevated-check vitamin B1-unclear if Neurontin has anything to do with-we will hold for now.  Check EEG.  Minimize narcotics as much as possible-continue Ativan per [facility] protocol-but doubt alcohol withdrawal-she was rehospitalized within 1 day of her discharge.  If no improvement-we will consider neuroimaging.  Change thiamine to high-dose x3 days.  Liver cirrhosis: Diagnosed recently on imaging studies-hepatitis serology/autoimmune work-up negative.  Recent EGD June 2023 did not show any obvious  varices.  GI following-thought to have NASH-but being ruled out for Wilson's disease.  Macrocytic  anemia: Significant drop in hemoglobin overnight but no overt evidence of bleeding-suspect this may be from acute illness.  Transfuse 1 unit of PRBC today-watch closely.    Recent folate/B12 levels in June stable.  History of esophageal stricture: S/p dilatation June 2023.  Denies any vomiting or dysphagia symptoms.  History of diabetic gastroparesis: No vomiting-appears stable-continue Reglan.  Encourage small portion meals.  DM-1 (A1c 7.6 on 7/30): Hypoglycemic episode earlier-decrease Semglee to 6 units twice daily-continue SSI-follow and adjust.    Recent Labs    12/23/21 2121 12/24/21 0721 12/24/21 0803  GLUCAP 116* 46* 133*     HLD: Continue statin  Moderate persistent asthma: Not in flare-continue bronchodilators.  History of chronic urinary retention-s/p self-catheterization at home: Has had significant urinary retention-with 1400 cc of residual urine earlier this morning.  Given encephalopathy-attempts to diuresis-mild AKI-go ahead and place a Foley catheter today.  Chronic pain/fibromyalgia/neuropathy: Hold on Neurontin-minimize narcotics given ongoing encephalopathy.  Obesity Estimated body mass index is 30.69 kg/m as calculated from the following:   Height as of this encounter: '5\' 4"'$  (1.626 m).   Weight as of this encounter: 81.1 kg.   Code status:   Code Status: Full Code   DVT Prophylaxis: enoxaparin (LOVENOX) injection 40 mg Start: 12/21/21 1000   Family Communication: Sister-in-law-[name]-updated over the phone on 9/4.   Disposition Plan: Status is: Observation The patient will require care spanning > 2 midnights and should be moved to inpatient because: Severe anasarca having difficulty ambulating-Will need inpatient optimization of her volume status before consideration of discharge.   Planned Discharge Destination:Home later this week.   Diet: Diet Order             Diet heart healthy/carb modified Room service appropriate? Yes; Fluid  consistency: Thin; Fluid restriction: 1200 mL Fluid  Diet effective now                     Antimicrobial agents: Anti-infectives (From admission, onward)    Start     Dose/Rate Route Frequency Ordered Stop   12/22/21 0800  cefTRIAXone (ROCEPHIN) 2 g in sodium chloride 0.9 % 100 mL IVPB        2 g 200 mL/hr over 30 Minutes Intravenous Every 24 hours 12/22/21 0236     12/21/21 2100  cefTRIAXone (ROCEPHIN) 1 g in sodium chloride 0.9 % 100 mL IVPB  Status:  Discontinued        1 g 200 mL/hr over 30 Minutes Intravenous Every 24 hours 12/21/21 0055 12/22/21 0236   12/21/21 0030  cefTRIAXone (ROCEPHIN) 1 g in sodium chloride 0.9 % 100 mL IVPB  Status:  Discontinued        1 g 200 mL/hr over 30 Minutes Intravenous  Once 12/21/21 0015 12/21/21 0055   12/20/21 2145  cefTRIAXone (ROCEPHIN) 1 g in sodium chloride 0.9 % 100 mL IVPB  Status:  Discontinued        1 g 200 mL/hr over 30 Minutes Intravenous  Once 12/20/21 2139 12/20/21 2214   12/20/21 0000  cephALEXin (KEFLEX) 500 MG capsule  Status:  Discontinued        500 mg Oral 4 times daily 12/20/21 2319 12/21/21         MEDICATIONS: Scheduled Meds:  sodium chloride   Intravenous Once   dicyclomine  10 mg Oral TID AC & HS   enoxaparin (LOVENOX) injection  40  mg Subcutaneous Q24H   famotidine  40 mg Oral BID   folic acid  2 mg Oral Daily   furosemide  40 mg Oral Daily   insulin aspart  0-5 Units Subcutaneous QHS   insulin aspart  0-9 Units Subcutaneous TID WC   insulin glargine-yfgn  12 Units Subcutaneous BID   lactulose  10 g Oral BID   lipase/protease/amylase  36,000 Units Oral TID WC   metoCLOPramide  10 mg Oral TID AC   midodrine  10 mg Oral TID WC   mometasone-formoterol  2 puff Inhalation BID   montelukast  10 mg Oral QHS   multivitamin with minerals  1 tablet Oral Daily   potassium chloride  40 mEq Oral Q4H   rosuvastatin  20 mg Oral QHS   sodium chloride flush  3 mL Intravenous Q12H   spironolactone  100 mg Oral  Daily   thiamine  500 mg Intravenous Daily   triamcinolone   Topical BID   Continuous Infusions:  albumin human 25 g (12/24/21 1022)   cefTRIAXone (ROCEPHIN)  IV 2 g (12/24/21 0824)   PRN Meds:.acetaminophen, albuterol, loperamide, LORazepam **OR** LORazepam, naLOXone (NARCAN)  injection, mouth rinse, oxyCODONE   I have personally reviewed following labs and imaging studies  LABORATORY DATA: CBC: Recent Labs  Lab 12/20/21 1626 12/20/21 1709 12/21/21 0400 12/22/21 0513 12/23/21 0448 12/24/21 0258  WBC 12.6*  --  13.5* 10.9* 6.6 5.1  NEUTROABS 8.5*  --   --   --   --   --   HGB 9.3* 10.9* 9.1* 9.2* 7.2* 6.8*  HCT 27.8* 32.0* 27.6* 28.3* 21.8* 21.2*  MCV 105.3*  --  108.2* 107.2* 105.8* 109.3*  PLT 219  --  222 222 157 147*     Basic Metabolic Panel: Recent Labs  Lab 12/20/21 1626 12/20/21 1709 12/21/21 0400 12/22/21 0513 12/23/21 0448 12/24/21 0258  NA 133* 132* 131* 132* 140 145  K 3.4* 3.3* 3.6 3.4* 3.0* 2.9*  CL 100 100 101 100 106 109  CO2 19*  --  20* 20* 23 25  GLUCOSE 238* 229* 185* 176* 112* 79  BUN '8 7 8 9 7 6  '$ CREATININE 1.23* 1.00 1.22* 1.29* 1.33* 1.33*  CALCIUM 8.6*  --  8.4* 8.8* 9.1 9.6  MG  --   --   --   --  1.9  --      GFR: Estimated Creatinine Clearance: 58 mL/min (A) (by C-G formula based on SCr of 1.33 mg/dL (H)).  Liver Function Tests: Recent Labs  Lab 12/18/21 0553 12/20/21 1626 12/21/21 0400 12/22/21 0513 12/23/21 0448  AST 49* 39 37 35 31  ALT 33 '30 28 25 18  '$ ALKPHOS 190* 191* 189* 169* 129*  BILITOT 1.3* 1.6* 1.8* 1.7* 1.1  PROT 6.3* 5.6* 5.7* 6.2* 5.8*  ALBUMIN 2.8* 2.3* 2.3* 2.8* 3.3*    No results for input(s): "LIPASE", "AMYLASE" in the last 168 hours. Recent Labs  Lab 12/23/21 0448  AMMONIA 37*     Coagulation Profile: No results for input(s): "INR", "PROTIME" in the last 168 hours.  Cardiac Enzymes: No results for input(s): "CKTOTAL", "CKMB", "CKMBINDEX", "TROPONINI" in the last 168 hours.  BNP (last  3 results) No results for input(s): "PROBNP" in the last 8760 hours.  Lipid Profile: No results for input(s): "CHOL", "HDL", "LDLCALC", "TRIG", "CHOLHDL", "LDLDIRECT" in the last 72 hours.  Thyroid Function Tests: No results for input(s): "TSH", "T4TOTAL", "FREET4", "T3FREE", "THYROIDAB" in the last 72 hours.  Anemia  Panel: Recent Labs    12/22/21 1816 12/23/21 0455  VITAMINB12  --  1,027*  FOLATE  --  5.3*  FERRITIN 110  --   TIBC 120*  --   IRON 35  --      Urine analysis:    Component Value Date/Time   COLORURINE YELLOW 12/21/2021 0809   APPEARANCEUR HAZY (A) 12/21/2021 0809   LABSPEC 1.027 12/21/2021 0809   PHURINE 6.0 12/21/2021 0809   GLUCOSEU 50 (A) 12/21/2021 0809   HGBUR NEGATIVE 12/21/2021 0809   BILIRUBINUR NEGATIVE 12/21/2021 0809   KETONESUR NEGATIVE 12/21/2021 0809   PROTEINUR NEGATIVE 12/21/2021 0809   UROBILINOGEN 0.2 04/11/2011 1437   NITRITE NEGATIVE 12/21/2021 0809   LEUKOCYTESUR SMALL (A) 12/21/2021 0809    Sepsis Labs: Lactic Acid, Venous    Component Value Date/Time   LATICACIDVEN 7.5 (HH) 12/05/2021 0238    MICROBIOLOGY: Recent Results (from the past 240 hour(s))  Blood culture (routine x 2)     Status: Abnormal   Collection Time: 12/21/21  4:00 AM   Specimen: BLOOD  Result Value Ref Range Status   Specimen Description BLOOD SITE NOT SPECIFIED  Final   Special Requests   Final    BOTTLES DRAWN AEROBIC AND ANAEROBIC Blood Culture adequate volume   Culture  Setup Time   Final    GRAM POSITIVE COCCI IN CHAINS AEROBIC BOTTLE ONLY Organism ID to follow CRITICAL RESULT CALLED TO, READ BACK BY AND VERIFIED WITH: T RUDISILL,PHARMD'@0229'$  12/22/21 [place]    Culture (A)  Final    STREPTOCOCCUS ANGINOSIS THE SIGNIFICANCE OF ISOLATING THIS ORGANISM FROM A SINGLE VENIPUNCTURE CANNOT BE PREDICTED WITHOUT FURTHER CLINICAL AND CULTURE CORRELATION. SUSCEPTIBILITIES AVAILABLE ONLY ON REQUEST. Performed at [facility], [place] [address]  [place], [place] [address]    Report Status 12/24/2021 FINAL  Final  Blood Culture ID Panel (Reflexed)     Status: Abnormal   Collection Time: 12/21/21  4:00 AM  Result Value Ref Range Status   Enterococcus faecalis NOT DETECTED NOT DETECTED Final   Enterococcus Faecium NOT DETECTED NOT DETECTED Final   Listeria monocytogenes NOT DETECTED NOT DETECTED Final   Staphylococcus species NOT DETECTED NOT DETECTED Final   Staphylococcus aureus (BCID) NOT DETECTED NOT DETECTED Final   Staphylococcus epidermidis NOT DETECTED NOT DETECTED Final   Staphylococcus lugdunensis NOT DETECTED NOT DETECTED Final   Streptococcus species DETECTED (A) NOT DETECTED Final    Comment: Not Enterococcus species, Streptococcus agalactiae, Streptococcus pyogenes, or Streptococcus pneumoniae. CRITICAL RESULT CALLED TO, READ BACK BY AND VERIFIED WITH: T RUDISILL,PHARMD'@0229'$  12/22/21 [place]    Streptococcus agalactiae NOT DETECTED NOT DETECTED Final   Streptococcus pneumoniae NOT DETECTED NOT DETECTED Final   Streptococcus pyogenes NOT DETECTED NOT DETECTED Final   A.calcoaceticus-baumannii NOT DETECTED NOT DETECTED Final   Bacteroides fragilis NOT DETECTED NOT DETECTED Final   Enterobacterales NOT DETECTED NOT DETECTED Final   Enterobacter cloacae complex NOT DETECTED NOT DETECTED Final   Escherichia coli NOT DETECTED NOT DETECTED Final   Klebsiella aerogenes NOT DETECTED NOT DETECTED Final   Klebsiella oxytoca NOT DETECTED NOT DETECTED Final   Klebsiella pneumoniae NOT DETECTED NOT DETECTED Final   Proteus species NOT DETECTED NOT DETECTED Final   Salmonella species NOT DETECTED NOT DETECTED Final   Serratia marcescens NOT DETECTED NOT DETECTED Final   Haemophilus influenzae NOT DETECTED NOT DETECTED Final   Neisseria meningitidis NOT DETECTED NOT DETECTED Final   Pseudomonas aeruginosa NOT DETECTED NOT DETECTED Final   Stenotrophomonas maltophilia NOT  DETECTED NOT DETECTED Final   Candida albicans NOT DETECTED NOT  DETECTED Final   Candida auris NOT DETECTED NOT DETECTED Final   Candida glabrata NOT DETECTED NOT DETECTED Final   Candida krusei NOT DETECTED NOT DETECTED Final   Candida parapsilosis NOT DETECTED NOT DETECTED Final   Candida tropicalis NOT DETECTED NOT DETECTED Final   Cryptococcus neoformans/gattii NOT DETECTED NOT DETECTED Final    Comment: Performed at [facility], [place] [address], [place], [place] [address]  Blood culture (routine x 2)     Status: None (Preliminary result)   Collection Time: 12/21/21  4:08 AM   Specimen: BLOOD  Result Value Ref Range Status   Specimen Description BLOOD SITE NOT SPECIFIED  Final   Special Requests   Final    BOTTLES DRAWN AEROBIC ONLY Blood Culture results may not be optimal due to an inadequate volume of blood received in culture bottles   Culture   Final    NO GROWTH 3 DAYS Performed at [facility], [place] [address] [place], [place] [address]    Report Status PENDING  Incomplete  Culture, body fluid w Gram Stain-bottle     Status: None (Preliminary result)   Collection Time: 12/21/21  3:21 PM   Specimen: Fluid  Result Value Ref Range Status   Specimen Description FLUID PERITONEAL  Final   Special Requests BOTTLES DRAWN AEROBIC AND ANAEROBIC  Final   Culture   Final    NO GROWTH 3 DAYS Performed at [facility], [place] [address], [place], [place] [address]    Report Status PENDING  Incomplete  Gram stain     Status: None   Collection Time: 12/21/21  3:21 PM   Specimen: Fluid  Result Value Ref Range Status   Specimen Description FLUID PERITONEAL  Final   Special Requests NONE  Final   Gram Stain   Final    WBC PRESENT,BOTH PMN AND MONONUCLEAR NO ORGANISMS SEEN CYTOSPIN SMEAR Performed at [facility], [address], [place], [place] [address]    Report Status 12/21/2021 FINAL  Final  Culture, blood (Routine X 2) w Reflex to ID Panel     Status: None (Preliminary result)   Collection Time: 12/22/21  6:16 PM    Specimen: BLOOD RIGHT HAND  Result Value Ref Range Status   Specimen Description BLOOD RIGHT HAND  Final   Special Requests   Final    BOTTLES DRAWN AEROBIC AND ANAEROBIC Blood Culture results may not be optimal due to an inadequate volume of blood received in culture bottles   Culture   Final    NO GROWTH 2 DAYS Performed at [facility], [place] [address], [place], [place] [address]    Report Status PENDING  Incomplete  Culture, blood (Routine X 2) w Reflex to ID Panel     Status: None (Preliminary result)   Collection Time: 12/22/21  6:16 PM   Specimen: BLOOD  Result Value Ref Range Status   Specimen Description BLOOD SITE NOT SPECIFIED  Final   Special Requests   Final    BOTTLES DRAWN AEROBIC AND ANAEROBIC Blood Culture adequate volume   Culture   Final    NO GROWTH 2 DAYS Performed at [facility], [address] [place], [place] [address]    Report Status PENDING  Incomplete    RADIOLOGY STUDIES/RESULTS: No results found.   LOS: 3 days   Oren Binet, MD  [facility]  To contact the attending provider between 7A-7P or the covering provider during after hours 7P-7A, please log into the web site [email or web address] and access using universal [place] password for that web site. If you do not have the password, please call the hospital operator.  12/24/2021, 12:11 PM

## 2021-12-25 ENCOUNTER — Inpatient Hospital Stay (HOSPITAL_COMMUNITY): Payer: Medicare Other

## 2021-12-25 ENCOUNTER — Encounter (HOSPITAL_COMMUNITY): Payer: Medicare Other

## 2021-12-25 ENCOUNTER — Encounter (HOSPITAL_COMMUNITY): Payer: Self-pay | Admitting: Internal Medicine

## 2021-12-25 DIAGNOSIS — K652 Spontaneous bacterial peritonitis: Secondary | ICD-10-CM | POA: Diagnosis not present

## 2021-12-25 DIAGNOSIS — A419 Sepsis, unspecified organism: Secondary | ICD-10-CM

## 2021-12-25 DIAGNOSIS — R4182 Altered mental status, unspecified: Secondary | ICD-10-CM | POA: Diagnosis not present

## 2021-12-25 DIAGNOSIS — D539 Nutritional anemia, unspecified: Secondary | ICD-10-CM

## 2021-12-25 DIAGNOSIS — G9340 Encephalopathy, unspecified: Secondary | ICD-10-CM | POA: Diagnosis not present

## 2021-12-25 DIAGNOSIS — Z789 Other specified health status: Secondary | ICD-10-CM | POA: Diagnosis not present

## 2021-12-25 DIAGNOSIS — K7460 Unspecified cirrhosis of liver: Secondary | ICD-10-CM | POA: Diagnosis not present

## 2021-12-25 DIAGNOSIS — R601 Generalized edema: Secondary | ICD-10-CM | POA: Diagnosis not present

## 2021-12-25 DIAGNOSIS — R188 Other ascites: Secondary | ICD-10-CM | POA: Diagnosis not present

## 2021-12-25 LAB — TYPE AND SCREEN
ABO/RH(D): O POS
Antibody Screen: NEGATIVE
Unit division: 0

## 2021-12-25 LAB — GLUCOSE, CAPILLARY
Glucose-Capillary: 112 mg/dL — ABNORMAL HIGH (ref 70–99)
Glucose-Capillary: 124 mg/dL — ABNORMAL HIGH (ref 70–99)
Glucose-Capillary: 252 mg/dL — ABNORMAL HIGH (ref 70–99)

## 2021-12-25 LAB — MAGNESIUM: Magnesium: 2.3 mg/dL (ref 1.7–2.4)

## 2021-12-25 LAB — PHOSPHORUS: Phosphorus: 3.3 mg/dL (ref 2.5–4.6)

## 2021-12-25 LAB — BASIC METABOLIC PANEL
Anion gap: 13 (ref 5–15)
BUN: <5 mg/dL — ABNORMAL LOW (ref 6–20)
CO2: 25 mmol/L (ref 22–32)
Calcium: 9.8 mg/dL (ref 8.9–10.3)
Chloride: 109 mmol/L (ref 98–111)
Creatinine, Ser: 1.19 mg/dL — ABNORMAL HIGH (ref 0.44–1.00)
GFR, Estimated: 59 mL/min — ABNORMAL LOW (ref >60)
Glucose, Bld: 95 mg/dL (ref 70–99)
Potassium: 3.6 mmol/L (ref 3.5–5.1)
Sodium: 147 mmol/L — ABNORMAL HIGH (ref 135–145)

## 2021-12-25 LAB — CBC
HCT: 28.4 % — ABNORMAL LOW (ref 36.0–46.0)
Hemoglobin: 9.3 g/dL — ABNORMAL LOW (ref 12.0–15.0)
MCH: 33.3 pg (ref 26.0–34.0)
MCHC: 32.7 g/dL (ref 30.0–36.0)
MCV: 101.8 fL — ABNORMAL HIGH (ref 80.0–100.0)
Platelets: 159 10*3/uL (ref 150–400)
RBC: 2.79 MIL/uL — ABNORMAL LOW (ref 3.87–5.11)
RDW: 23.2 % — ABNORMAL HIGH (ref 11.5–15.5)
WBC: 7.3 10*3/uL (ref 4.0–10.5)
nRBC: 0 % (ref 0.0–0.2)

## 2021-12-25 LAB — BPAM RBC
Blood Product Expiration Date: 202310032359
ISSUE DATE / TIME: 202309041144
Unit Type and Rh: 5100

## 2021-12-25 LAB — AMMONIA: Ammonia: 35 umol/L (ref 9–35)

## 2021-12-25 LAB — ALPHA-1-ANTITRYPSIN: A-1 Antitrypsin, Ser: 160 mg/dL (ref 100–188)

## 2021-12-25 LAB — AFP TUMOR MARKER: AFP, Serum, Tumor Marker: 2.3 ng/mL (ref 0.0–6.4)

## 2021-12-25 LAB — LACTATE DEHYDROGENASE: LDH: 151 U/L (ref 98–192)

## 2021-12-25 LAB — URIC ACID: Uric Acid, Serum: 3.2 mg/dL (ref 2.5–7.1)

## 2021-12-25 LAB — IGG: IgG (Immunoglobin G), Serum: 782 mg/dL (ref 586–1602)

## 2021-12-25 LAB — PROCALCITONIN: Procalcitonin: 0.5 ng/mL

## 2021-12-25 MED ORDER — FUROSEMIDE 10 MG/ML IJ SOLN
40.0000 mg | Freq: Once | INTRAMUSCULAR | Status: AC
Start: 2021-12-25 — End: 2021-12-25
  Administered 2021-12-25: 40 mg via INTRAVENOUS
  Filled 2021-12-25: qty 4

## 2021-12-25 MED ORDER — METRONIDAZOLE 500 MG/100ML IV SOLN
500.0000 mg | Freq: Two times a day (BID) | INTRAVENOUS | Status: DC
  Administered 2021-12-25: 500 mg via INTRAVENOUS
  Filled 2021-12-25 (×2): qty 100

## 2021-12-25 MED ORDER — QUETIAPINE FUMARATE 25 MG PO TABS
25.0000 mg | ORAL_TABLET | Freq: Every day | ORAL | Status: DC
  Administered 2021-12-25: 25 mg via ORAL
  Filled 2021-12-25 (×2): qty 1

## 2021-12-25 MED ORDER — METRONIDAZOLE 500 MG PO TABS
500.0000 mg | ORAL_TABLET | Freq: Two times a day (BID) | ORAL | Status: DC
Start: 2021-12-25 — End: 2021-12-25

## 2021-12-25 MED ORDER — METRONIDAZOLE 500 MG/100ML IV SOLN
500.0000 mg | Freq: Two times a day (BID) | INTRAVENOUS | Status: AC
  Administered 2021-12-26 – 2021-12-29 (×6): 500 mg via INTRAVENOUS
  Filled 2021-12-25 (×7): qty 100

## 2021-12-25 NOTE — Progress Notes (Signed)
Pt placed on venturi mask 14l at 55%. Rapid response nurse still at bed side. Pt sats at 92% on same. Observation continues.

## 2021-12-25 NOTE — Progress Notes (Signed)
Patient transported to MRI, was given ativan prior.

## 2021-12-25 NOTE — Significant Event (Addendum)
Rapid Response Event Note   Reason for Call :  Hypoxia   Pt was given '2mg'$  ativan d/t agitation/hallucinations. After ativan administration, pt became hypoxic-70s. Pt was placed on a NRB.   Initial Focused Assessment:  Pt lying in bed with eyes closed, very somnolent. Pt will open eyes, follow simple commands in all extremities, and say her name with repeated verbal stimulations. Pupils 6, equal, and sluggish. Breathing is mildly labored/tachypneic. Lungs are diminished t/o. Skin hot to touch. With stimulation, pt will move around bed and moan intermittently.   T-100.2(R), 101, BP-117/71, RR-25, SpO2-100% on NRB.  Attempted to place pt on 6LNC-84%.  VM .55 placed on pt with increasing SpO2-94%.    Interventions:  NRB>VM PCXR-Interval development of diffuse interstitial and hazy parahilar opacity. Imaging features suggest edema. CBG-112 EKG Ativan and oxy d/c'd '40mg'$  lasix IV Plan of Care:  Pt on .55 VM with SpO2-94%. Give lasix and monitor response. Obtain EKG.  May wean down FiO2 as SpO2 allows. Pt may benefit from tx to PCU. Please call RRT if further assistance needed.    Event Summary:   MD Notified: Dr. Marlowe Sax Call [phone] Arrival [phone] End ORVI:[id number]  Dillard Essex, RN

## 2021-12-25 NOTE — Progress Notes (Addendum)
Daily Rounding Note  12/25/2021, 1:30 PM  LOS: 4 days   SUBJECTIVE:   Chief complaint: cirrhosis.  AMS     Pt aspirated and became hypoxic.  Transferred to [place].  Very confused and crying but unable to express exactly what is bothering her.    OBJECTIVE:         Vital signs in last 24 hours:    Temp:  [98 F (36.7 C)-100.2 F (37.9 C)] 98.7 F (37.1 C) (09/05 0834) Pulse Rate:  [88-107] 107 (09/05 0834) Resp:  [16-20] 20 (09/05 0834) BP: (95-117)/(64-76) 101/68 (09/05 0834) SpO2:  [91 %-98 %] 91 % (09/05 0834) Last BM Date : 12/21/21 Filed Weights   12/21/21 2217 12/23/21 0535 12/24/21 0507  Weight: 84.9 kg 84.7 kg 81.1 kg   General: pale.  Crying, moaning.  Non-sensical.     Heart: RRR.  Rate in 90s Chest: no cough or labored breathing Abdomen: soft, ND.  No wincing or reaction to palpation she just continues to cry when you touch anywhere on her body.  Bowel sounds present. Extremities: Edema in the lower legs.  Bilateral symmetric erythema in the lower legs. Neuro/Psych: Alert but not oriented.  Not following commands.  Intake/Output from previous day: 09/04 0701 - 09/05 0700 In: 941 [P.O.:430; I.V.:12.7; Blood:315; IV Piggyback:183.2] Out: 2229 [Urine:4350]  Intake/Output this shift: Total I/O In: -  Out: 1850 [Urine:1850]  Lab Results: Recent Labs    12/23/21 0448 12/24/21 0258 12/24/21 1700 12/25/21 0235  WBC 6.6 5.1  --  7.3  HGB 7.2* 6.8* 8.5* 9.3*  HCT 21.8* 21.2* 26.5* 28.4*  PLT 157 147*  --  159   BMET Recent Labs    12/23/21 0448 12/24/21 0258 12/25/21 0235  NA 140 145 147*  K 3.0* 2.9* 3.6  CL 106 109 109  CO2 '23 25 25  '$ GLUCOSE 112* 79 95  BUN 7 6 <5*  CREATININE 1.33* 1.33* 1.19*  CALCIUM 9.1 9.6 9.8   LFT Recent Labs    12/23/21 0448  PROT 5.8*  ALBUMIN 3.3*  AST 31  ALT 18  ALKPHOS 129*  BILITOT 1.1   PT/INR No results for input(s): "LABPROT", "INR" in the  last 72 hours. Hepatitis Panel No results for input(s): "HEPBSAG", "HCVAB", "HEPAIGM", "HEPBIGM" in the last 72 hours.  Studies/Results: DG CHEST PORT 1 VIEW  Result Date: 12/25/2021 CLINICAL DATA:  Hypoxia. EXAM: PORTABLE CHEST 1 VIEW COMPARISON:  12/20/2021 FINDINGS: 0604 hours. Low volume film. Diffuse interstitial and hazy parahilar opacity bilaterally is new in the interval. Probable atelectasis in the lung bases. No substantial pleural effusion. Cardiopericardial silhouette is at upper limits of normal for size. The visualized bony structures of the thorax are unremarkable. Telemetry leads overlie the chest. IMPRESSION: Interval development of diffuse interstitial and hazy parahilar opacity. Imaging features suggest edema. Electronically Signed   By: Misty Stanley M.D.   On: 12/25/2021 06:28   MR BRAIN WO CONTRAST  Result Date: 12/25/2021 CLINICAL DATA:  Initial evaluation for mental status change, unknown cause. EXAM: MRI HEAD WITHOUT CONTRAST TECHNIQUE: Multiplanar, multiecho pulse sequences of the brain and surrounding structures were obtained without intravenous contrast. COMPARISON:  Prior CT from 12/09/2021. FINDINGS: Brain: Examination technically limited as the patient was unable to tolerate the exam. Axial and coronal DWI sequences only were performed. Diffusion-weighted imaging demonstrates no evidence for acute or subacute infarct. No visible mass lesion, mass effect, or midline shift. No visible areas  of chronic cortical infarction. Ventricles normal size without hydrocephalus. No extra-axial fluid collection. Vascular: Not well assessed on this limited exam. Skull and upper cervical spine: Not well assessed on this limited exam. Sinuses/Orbits: Not well assessed on this limited exam. Other: None. IMPRESSION: 1. Technically limited study due to the patient's inability to tolerate the exam. Axial and coronal DWI sequences only were performed. 2. No acute or subacute intracranial infarct. No  other definite intracranial abnormality on this limited exam. Electronically Signed   By: Jeannine Boga M.D.   On: 12/25/2021 01:54   VAS [place] UPPER EXTREMITY VENOUS DUPLEX  Result Date: 12/24/2021 UPPER VENOUS STUDY  Patient Name:  Anna Garcia  Date of Exam:   12/24/2021 Medical Rec #: [id number]     Accession #:    [id number] Date of Birth: Sep 15, 1980      Patient Gender: F Patient Age:   17 years Exam Location:  [facility] Procedure:      VAS [place] UPPER EXTREMITY VENOUS DUPLEX Referring Phys: Oren Binet --------------------------------------------------------------------------------  Indications: Swelling Limitations: Patient unable to follow commands, restraints, bandaging. Comparison Study: No prior studies. Performing Technologist: Darlin Coco RDMS, RVT  Examination Guidelines: A complete evaluation includes B-mode imaging, spectral Doppler, color Doppler, and power Doppler as needed of all accessible portions of each vessel. Bilateral testing is considered an integral part of a complete examination. Limited examinations for reoccurring indications may be performed as noted.  Right Findings: +----------+------------+---------+-----------+----------+---------------------+ RIGHT     CompressiblePhasicitySpontaneousProperties       Summary        +----------+------------+---------+-----------+----------+---------------------+ IJV           Full       Yes       Yes                                    +----------+------------+---------+-----------+----------+---------------------+ Subclavian               Yes       Yes                                    +----------+------------+---------+-----------+----------+---------------------+ Axillary      Full       Yes       Yes                                    +----------+------------+---------+-----------+----------+---------------------+ Brachial      Full                                                         +----------+------------+---------+-----------+----------+---------------------+ Radial        Full                                                        +----------+------------+---------+-----------+----------+---------------------+ Ulnar  Unable to visualize                                                       due to positioning   +----------+------------+---------+-----------+----------+---------------------+ Cephalic      None       No        No                       Acute         +----------+------------+---------+-----------+----------+---------------------+ Basilic                                              Unable to visualize                                                       due to positioning   +----------+------------+---------+-----------+----------+---------------------+  Left Findings: +----------+------------+---------+-----------+----------+-------+ LEFT      CompressiblePhasicitySpontaneousPropertiesSummary +----------+------------+---------+-----------+----------+-------+ IJV                      Yes       Yes                      +----------+------------+---------+-----------+----------+-------+ Subclavian               Yes       Yes                      +----------+------------+---------+-----------+----------+-------+ Axillary      Full       Yes       Yes                      +----------+------------+---------+-----------+----------+-------+ Brachial      Full                                          +----------+------------+---------+-----------+----------+-------+ Radial        Full                                          +----------+------------+---------+-----------+----------+-------+ Ulnar         Full                                          +----------+------------+---------+-----------+----------+-------+ Cephalic      None       No         No                Acute  +----------+------------+---------+-----------+----------+-------+ Basilic       Full                                          +----------+------------+---------+-----------+----------+-------+  Summary:  Right: No evidence of deep vein thrombosis in the upper extremity. Findings consistent with acute superficial vein thrombosis involving the right cephalic vein.  Left: No evidence of deep vein thrombosis in the upper extremity. Findings consistent with acute superficial vein thrombosis involving the left cephalic vein. However, unable to visualize the basilic vein and ulnar veins.  *See table(s) above for measurements and observations.  Diagnosing physician: Monica Martinez MD Electronically signed by Monica Martinez MD on 12/24/2021 at 5:06:18 PM.    Final    EEG adult  Result Date: 12/24/2021 Lora Havens, MD     12/24/2021  3:34 PM Patient Name: Anna Garcia MRN: [id number] Epilepsy Attending: Lora Havens Referring Physician/Provider: Jonetta Osgood, MD Date: 12/24/2021 Duration: 22.08 mins Patient history: 41 year old female with altered mental status.  EEG to evaluate for seizure. Level of alertness: Awake AEDs during EEG study: None Technical aspects: This EEG study was done with scalp electrodes positioned according to the 10-20 International system of electrode placement. Electrical activity was reviewed with band pass filter of 1-'70Hz'$ , sensitivity of 7 uV/mm, display speed of 67m/sec with a '60Hz'$  notched filter applied as appropriate. EEG data were recorded continuously and digitally stored.  Video monitoring was available and reviewed as appropriate. Description: No posterior dominant rhythm was seen. EEG showed continuous generalized 3 to 6 Hz theta-delta slowing. Hyperventilation and photic stimulation were not performed.   ABNORMALITY - Continuous slow, generalized IMPRESSION: This study is suggestive of moderate diffuse encephalopathy, nonspecific  etiology. No seizures or epileptiform discharges were seen throughout the recording. Priyanka OBarbra Sarks   Scheduled Meds:  sodium chloride   Intravenous Once   dicyclomine  10 mg Oral TID AC & HS   enoxaparin (LOVENOX) injection  40 mg Subcutaneous Q24H   famotidine  40 mg Oral BID   folic acid  2 mg Oral Daily   insulin aspart  0-6 Units Subcutaneous TID WC   insulin glargine-yfgn  6 Units Subcutaneous BID   lactulose  10 g Oral BID   lipase/protease/amylase  36,000 Units Oral TID WC   metoCLOPramide  10 mg Oral TID AC   midodrine  10 mg Oral TID WC   mometasone-formoterol  2 puff Inhalation BID   montelukast  10 mg Oral QHS   multivitamin with minerals  1 tablet Oral Daily   QUEtiapine  25 mg Oral QHS   rosuvastatin  20 mg Oral QHS   sodium chloride flush  3 mL Intravenous Q12H   spironolactone  100 mg Oral Daily   thiamine  500 mg Intravenous Daily   triamcinolone   Topical BID   Continuous Infusions:  albumin human 25 g (12/24/21 2118)   cefTRIAXone (ROCEPHIN)  IV 2 g (12/25/21 1104)   dextrose 5 % and 0.9% NaCl 25 mL/hr at 12/24/21 1457   metronidazole 500 mg (12/25/21 1153)   PRN Meds:.acetaminophen, albuterol, loperamide, naLOXone (NARCAN)  injection, mouth rinse  ASSESMENT:   New dx cirrhosis.  decompensated with anasarca/ascites, SBP.  Initial thought was cirrhosis was due to NZoarbut alcohol may be playing a role and there is also concern for  Wilson's disease as she has low ceruloplasmin, 24-hour urine copper collection pending.  Viral hepatitis serologies, autoimmune markers for liver disease negative.  Encephalopathy, question HE vs EtOH withdrawal vs Wilson's disease.  Continues on lactulose.  EEG w changes of diffuse encephalopathy of unclear etiology.  No seizure activity..  Awaiting further recommendations from neurology.  Aspiration, hypoxia,  aspiration pneumonia in setting of altered mental status.  Flagyl added to Rocephin.  Streptococcal bacteremia.  Day of  5/7 Rocephin, is her lower extremity redness/cellulitis B etiology?       DM type 1.      Gastroparesis.  Chronic N/V.  On metoclopramide 10 mg po tid, famotidine 40 mg po bid.   6/ 5 2023 EGD by Dr Watt Climes for dysphagia:  Benign appearing distal esophageal stenosis was dilated.  Normal stomach, normal examined duodenum to D3.  Small hiatal hernia  Superficial thrombosis of left cephalic vein.  AKI improved.   PLAN   Await reading of 24-hour urine copper collection which is in process.  Collection completed and as it was started on 9/2.     Isamar Nazir  12/25/2021, 1:30 PM Phone [phone]    Attending physician's note   I have taken a history, reviewed the chart and examined the patient. I performed a substantive portion of this encounter, including complete performance of at least one of the key components, in conjunction with the APP. I agree with the APP's note, impression and recommendations.    41 year old female with type 1 diabetes, was recently hospitalized with DKA, new diagnosis of cirrhosis History of alcohol use  MELD 3.0: 12 at 11/20/2021  3:27 AM MELD-Na: 10 at 11/20/2021  3:27 AM   Ceruloplasmin level is low less than 10 Worsening mental status, no significant elevation in ammonia  Presentation is concerning for Wilson's disease, unable to obtain slit-lamp eye exam to exclude KF ring 24-hour urine copper pending  Requested serum copper level  We will request ultrasound-guided liver biopsy by IR to help differentiate between steato hepatitis/alcohol induced liver fibrosis versus Wilson's disease  She has severe folate deficiency,  Wernicke's encephalopathy also in the differential given history of chronic alcoholism and poor nutritional status  MRI brain negative for any acute pathology  Continue supportive care  The patient was provided an opportunity to ask questions and all were answered. The patient agreed with the plan and demonstrated an understanding  of the instructions.   Damaris Hippo , MD [phone]

## 2021-12-25 NOTE — Progress Notes (Signed)
Pt refusing bedtime CBG after multiple attempts. Dr. Marlowe Sax paged at 2205 regarding this and pt's restlessness. Still awaiting a response from MD.

## 2021-12-25 NOTE — Progress Notes (Addendum)
Patient noted to desat at 79-80 after dose of ativan '2mg'$  due to increase agitation. Placed on 3l still no improvement increased to 6l still at 80 %. Placed on rebreather bag at 15l rapid response called. Stas 99 % on same

## 2021-12-25 NOTE — Progress Notes (Signed)
[facility]  Date of Admission:  12/20/2021      Total days of antibiotics 4   Ceftriaxone 9/2 (higher dose)           ASSESSMENT: Anna Garcia is a 41 y.o. female with T1DM, gastroparesis, obesity, cirrhosis admitted from home a few days after recent discharge (for DKA management) for FTT/Weakness. Found to have SBP and bacteremia 1/2 sets with strep anginosis.  Repeat blood cultures have not re-grown anything preliminarily now from 9/2. CT A/P without any abscess. Lower risk for IE Paracentesis on 9/01 met criteria for SBP --> cultures negative on IV rocephin day 5 of treatment for this. WBC improving, no fevers; with her being so disoriented this morning (?ativan effect) on our assessment hard to determine if she is clinically better.   Confusion with Respiratory failure, primary team added metronidazole to cover possible aspiration component.  No evidence of cellulitis - all appears c/w chronic stasis dermatitis.   Agree with SBP prophylaxis but in light of overnight events and QTc 503, would recommend to avoid a QTc prolonging medication. Would recommend Bactrim daily instead of cipro.     PLAN: Continue Ceftriaxone 2gm for 7d  Would recommend bactrim daily for SBP prophylaxis to avoid further QTc prolonging effect  OK to continue the metronidazole for concern over aspiration - 7d.     Principal Problem:   Cirrhosis of liver with ascites, unspecified hepatic cirrhosis type (HCC) Active Problems:   Type 1 diabetes mellitus (HCC)   Sinus tachycardia   Moderate persistent asthma   Macrocytic anemia   Hypotension   Cellulitis of foot, right   Generalized weakness   Alcohol use   Chronic urinary retention   Anasarca   Encephalopathy acute   Sepsis (HCC)   SBP (spontaneous bacterial peritonitis) (HCC)    sodium chloride   Intravenous Once   dicyclomine  10 mg Oral TID AC & HS   enoxaparin (LOVENOX) injection  40 mg Subcutaneous  Q24H   famotidine  40 mg Oral BID   folic acid  2 mg Oral Daily   insulin aspart  0-6 Units Subcutaneous TID WC   insulin glargine-yfgn  6 Units Subcutaneous BID   lactulose  10 g Oral BID   lipase/protease/amylase  36,000 Units Oral TID WC   metoCLOPramide  10 mg Oral TID AC   midodrine  10 mg Oral TID WC   mometasone-formoterol  2 puff Inhalation BID   montelukast  10 mg Oral QHS   multivitamin with minerals  1 tablet Oral Daily   QUEtiapine  25 mg Oral QHS   rosuvastatin  20 mg Oral QHS   sodium chloride flush  3 mL Intravenous Q12H   spironolactone  100 mg Oral Daily   thiamine  500 mg Intravenous Daily   triamcinolone   Topical BID    SUBJECTIVE: Lethargic. Confused/disoriented. Able to answer what year it is. Responds to any tactile stimulation/tough with groaning and grimacing.     Review of Systems: Review of Systems  Unable to perform ROS: Mental status change    No Known Allergies  OBJECTIVE: Vitals:   12/24/21 1651 12/24/21 2000 12/25/21 0600 12/25/21 0834  BP: 100/66 103/76 117/71 101/68  Pulse: 99 88 (!) 105 (!) 107  Resp: _0 Temp: 98.3 F (36.8 C) 98 F (36.7 C) 100.2 F (37.9 C) 98.7 F (37.1 C)  TempSrc:   Rectal Oral  SpO2: 97% 94% 93% 91%  Weight:      Height:       Body mass index is 30.69 kg/m.  Physical Exam Constitutional:      Appearance: She is ill-appearing.     Comments: Groaning/grimacing in bed with any touch. Appears uncomfortable.   Cardiovascular:     Rate and Rhythm: Regular rhythm. Tachycardia present.  Pulmonary:     Effort: Pulmonary effort is normal.     Breath sounds: Normal breath sounds.  Abdominal:     Palpations: Abdomen is soft.     Tenderness: There is abdominal tenderness.  Skin:    General: Skin is warm and dry.     Comments: B/L LE edema, pitting with stasis dermatitis involving both legs.   Neurological:     Mental Status: She is disoriented.     Comments: Upper body twitching noted       Lab Results Lab Results  Component Value Date   WBC 7.3 12/25/2021   HGB 9.3 (L) 12/25/2021   HCT 28.4 (L) 12/25/2021   MCV 101.8 (H) 12/25/2021   PLT 159 12/25/2021    Lab Results  Component Value Date   CREATININE 1.19 (H) 12/25/2021   BUN <5 (L) 12/25/2021   NA 147 (H) 12/25/2021   K 3.6 12/25/2021   CL 109 12/25/2021   CO2 25 12/25/2021    Lab Results  Component Value Date   ALT 18 12/23/2021   AST 31 12/23/2021   GGT 1,054 (H) 11/19/2021   ALKPHOS 129 (H) 12/23/2021   BILITOT 1.1 12/23/2021     Microbiology: Recent Results (from the past 240 hour(s))  Blood culture (routine x 2)     Status: Abnormal   Collection Time: 12/21/21  4:00 AM   Specimen: BLOOD  Result Value Ref Range Status   Specimen Description BLOOD SITE NOT SPECIFIED  Final   Special Requests   Final    BOTTLES DRAWN AEROBIC AND ANAEROBIC Blood Culture adequate volume   Culture  Setup Time   Final    GRAM POSITIVE COCCI IN CHAINS AEROBIC BOTTLE ONLY Organism ID to follow CRITICAL RESULT CALLED TO, READ BACK BY AND VERIFIED WITH: T RUDISILL,[email or web address]  12/22/21 [facility]    Culture (A)  Final    STREPTOCOCCUS ANGINOSIS THE SIGNIFICANCE OF ISOLATING THIS ORGANISM FROM A SINGLE VENIPUNCTURE CANNOT BE PREDICTED WITHOUT FURTHER CLINICAL AND CULTURE CORRELATION. SUSCEPTIBILITIES AVAILABLE ONLY ON REQUEST. Performed at [facility], [place] [address] [place], [place] [address]    Report Status 12/24/2021 FINAL  Final  Blood Culture ID Panel (Reflexed)     Status: Abnormal   Collection Time: 12/21/21  4:00 AM  Result Value Ref Range Status   Enterococcus faecalis NOT DETECTED NOT DETECTED Final   Enterococcus Faecium NOT DETECTED NOT DETECTED Final   Listeria monocytogenes NOT DETECTED NOT DETECTED Final   Staphylococcus species NOT DETECTED NOT DETECTED Final   Staphylococcus aureus (BCID) NOT DETECTED NOT DETECTED Final   Staphylococcus epidermidis NOT DETECTED NOT DETECTED Final    Staphylococcus lugdunensis NOT DETECTED NOT DETECTED Final   Streptococcus species DETECTED (A) NOT DETECTED Final    Comment: Not Enterococcus species, Streptococcus agalactiae, Streptococcus pyogenes, or Streptococcus pneumoniae. CRITICAL RESULT CALLED TO, READ BACK BY AND VERIFIED WITH: T RUDISILL,[email or web address]  12/22/21 [place]    Streptococcus agalactiae NOT DETECTED NOT DETECTED Final   Streptococcus pneumoniae NOT DETECTED NOT DETECTED Final   Streptococcus pyogenes NOT DETECTED NOT DETECTED Final   A.calcoaceticus-baumannii NOT DETECTED NOT  DETECTED Final   Bacteroides fragilis NOT DETECTED NOT DETECTED Final   Enterobacterales NOT DETECTED NOT DETECTED Final   Enterobacter cloacae complex NOT DETECTED NOT DETECTED Final   Escherichia coli NOT DETECTED NOT DETECTED Final   Klebsiella aerogenes NOT DETECTED NOT DETECTED Final   Klebsiella oxytoca NOT DETECTED NOT DETECTED Final   Klebsiella pneumoniae NOT DETECTED NOT DETECTED Final   Proteus species NOT DETECTED NOT DETECTED Final   Salmonella species NOT DETECTED NOT DETECTED Final   Serratia marcescens NOT DETECTED NOT DETECTED Final   Haemophilus influenzae NOT DETECTED NOT DETECTED Final   Neisseria meningitidis NOT DETECTED NOT DETECTED Final   Pseudomonas aeruginosa NOT DETECTED NOT DETECTED Final   Stenotrophomonas maltophilia NOT DETECTED NOT DETECTED Final   Candida albicans NOT DETECTED NOT DETECTED Final   Candida auris NOT DETECTED NOT DETECTED Final   Candida glabrata NOT DETECTED NOT DETECTED Final   Candida krusei NOT DETECTED NOT DETECTED Final   Candida parapsilosis NOT DETECTED NOT DETECTED Final   Candida tropicalis NOT DETECTED NOT DETECTED Final   Cryptococcus neoformans/gattii NOT DETECTED NOT DETECTED Final    Comment: Performed at [facility], [place] [address] [place], [place] [address]  Blood culture (routine x 2)     Status: None (Preliminary result)   Collection Time: 12/21/21  4:08 AM   Specimen:  BLOOD  Result Value Ref Range Status   Specimen Description BLOOD SITE NOT SPECIFIED  Final   Special Requests   Final    BOTTLES DRAWN AEROBIC ONLY Blood Culture results may not be optimal due to an inadequate volume of blood received in culture bottles   Culture   Final    NO GROWTH 4 DAYS Performed at [facility], [place] [address] [place], [place] [address]    Report Status PENDING  Incomplete  Culture, body fluid w Gram Stain-bottle     Status: None (Preliminary result)   Collection Time: 12/21/21  3:21 PM   Specimen: Fluid  Result Value Ref Range Status   Specimen Description FLUID PERITONEAL  Final   Special Requests BOTTLES DRAWN AEROBIC AND ANAEROBIC  Final   Culture   Final    NO GROWTH 4 DAYS Performed at [facility], [place] [address], [place], [place] [address]    Report Status PENDING  Incomplete  Gram stain     Status: None   Collection Time: 12/21/21  3:21 PM   Specimen: Fluid  Result Value Ref Range Status   Specimen Description FLUID PERITONEAL  Final   Special Requests NONE  Final   Gram Stain   Final    WBC PRESENT,BOTH PMN AND MONONUCLEAR NO ORGANISMS SEEN CYTOSPIN SMEAR Performed at [facility], [address] [place], [place] [address]    Report Status 12/21/2021 FINAL  Final  Culture, blood (Routine X 2) w Reflex to ID Panel     Status: None (Preliminary result)   Collection Time: 12/22/21  6:16 PM   Specimen: BLOOD RIGHT HAND  Result Value Ref Range Status   Specimen Description BLOOD RIGHT HAND  Final   Special Requests   Final    BOTTLES DRAWN AEROBIC AND ANAEROBIC Blood Culture results may not be optimal due to an inadequate volume of blood received in culture bottles   Culture   Final    NO GROWTH 3 DAYS Performed at [facility], [place] [address] [place], [place] [address]    Report Status PENDING  Incomplete  Culture, blood (Routine X 2) w Reflex to ID Panel     Status: None (Preliminary result)   Collection Time:  12/22/21  6:16 PM   Specimen: BLOOD  Result Value Ref Range Status   Specimen Description BLOOD SITE NOT SPECIFIED  Final   Special Requests   Final    BOTTLES DRAWN AEROBIC AND ANAEROBIC Blood Culture adequate volume   Culture   Final    NO GROWTH 3 DAYS Performed at [facility], [address] [place], [place] [address]    Report Status PENDING  Incomplete      Janene Madeira, MSN, NP-C [facility] [email or web address] Pager: [phone] Office: [phone] [facility] Main Line: [place] Communication Welcome

## 2021-12-25 NOTE — Progress Notes (Signed)
IVT consult to have PIV placed.  Pt has one working PIV and has had 15 PIV's placed between Aug and Sept.  Advised RN to speak w/MD to see if any incompatible Rx's/fluids can be held while others run.  RN, [place] to place new consult if necessary.

## 2021-12-25 NOTE — Progress Notes (Addendum)
PROGRESS NOTE        PATIENT DETAILS Name: Anna Garcia Age: 41 y.o. Sex: female Date of Birth: 02/22/1981 Admit Date: 12/20/2021 Admitting Physician Evalee Mutton Kristeen Mans, MD PCP:Day, [name], MD  Brief Summary: Patient is a 41 y.o.  female with history of DM-1, gastroparesis, chronic pain syndrome/fibromyalgia, HTN, chronic urinary retention-self caths at home-who was just discharged from this facility on 8/29 after being treated for DKA/AKI/volume overload-presented back to the hospital with anasarca-3+ pitting edema in the lower extremity up to the thighs.  She was found to have SBP in Streptococcus bacteremia.  Further hospital course was complicated by development of encephalopathy and acute hypoxic respiratory failure likely due to aspiration pneumonia.  See below for further details.  Significant events: 8/22-8/29>> hospitalization for DKA/AKI-found to have liver cirrhosis 8/31>> admit for anasarca/significant lower extremity edema. 9/01>> IV albumin started-midodrine dosage escalated. 9/03>> hallucinating-confused. 9/05>> hypoxic after Ativan given for MRI brain-started on Ventimask.  Transfer to progressive care-subsequently titrated down to 3-4 L.  Significant studies: 2/23>>Hbs Ag, HCV ZR:[id number] 8/24>> bilateral lower extremity Doppler: No DVT. 8/28>>ANA/RA/Anti mitrochondrial Ab/Anti smooth muscle FH:[id number] 8/26>> Echo: EF-60-65%. 8/31>> CXR: No PNA 8/31>> CT chest/abdomen/pelvis: No PE, liver cirrhosis, prominent small bowel wall thickening, moderate volume ascites.  Generalized subcutaneous edema. 9/01>> UA: No proteinuria. 9/3>> MRI brain: Limited images-but no acute CVA. 9/3>> EEG: No seizures.   Significant microbiology data: 9/01>>1/2 blood culture: Streptococcus anginosus 9/01>>ascites fluid culture: No growth 9/02>> blood culture: No growth.  Procedures: 9/01>> diagnostic paracentesis-1 L (WBC 590 w 85%  neutrophils)  Consults: GI, ID  Subjective: Lethargic from Ativan she received earlier-but protecting airway-opened eyes-mumbled something incoherent.  Was on a Ventimask earlier-but was transitioned to 3-4 L of oxygen via nasal cannula.  Objective: Vitals: Blood pressure 101/68, pulse (!) 107, temperature 98.7 F (37.1 C), temperature source Oral, resp. rate 20, height '5\' 4"'$  (1.626 m), weight 81.1 kg, SpO2 91 %.   Exam: Gen Exam: Somnolent-not in any distress HEENT:atraumatic, normocephalic Chest: B/L clear to auscultation anteriorly CVS:S1S2 regular Abdomen:soft non tender, non distended Extremities:+ edema and some mild erythema persists. Neurology: Moves all 4 extremities-in restraints. Skin: no rash   Pertinent Labs/Radiology:    Latest Ref Rng & Units 12/25/2021    2:35 AM 12/24/2021    5:00 PM 12/24/2021    2:58 AM  CBC  WBC 4.0 - 10.5 K/uL 7.3   5.1   Hemoglobin 12.0 - 15.0 g/dL 9.3  8.5  6.8   Hematocrit 36.0 - 46.0 % 28.4  26.5  21.2   Platelets 150 - 400 K/uL 159   147     Lab Results  Component Value Date   NA 147 (H) 12/25/2021   K 3.6 12/25/2021   CL 109 12/25/2021   CO2 25 12/25/2021      Assessment/Plan: Anasarca: Significant improvement in volume status over the past several days, -12.4 L so far.  Although improved-still with lower extremity edema-continue Lasix/Aldactone.  Acute hypoxic respiratory failure likely due to aspiration pneumonia: I suspect this is from aspiration pneumonia-doubt CHF given significant improvement in volume status and brisk diuresis overnight.  Patient has been markedly confused for the past several days-hence may have aspirated-especially with Ativan use.  Add Flagyl-continue Rocephin.  Benzodiazepines have been discontinued at this point.  Acute metabolic encephalopathy: Unclear etiology-although she has a history of  EtOH use-she was just discharged from the hospital-hence do not think she had any significant time to relapse.   She is being worked up for [employer] if [place] is related to her underlying hepatic pathology.  Ammonia levels are normal-hence unlikely to be hepatic encephalopathy.  Continue to minimize narcotics as much as possible-awaiting vitamin B1 levels-Neurontin discontinued to see if mental status will improve.  MRI brain-limited study due to motion artifact but no major abnormality seen.  EEG without seizures.-Consulted neurology for further assistance today-continue high-dose thiamine-no longer on Ativan per CIWA protocol-we will try low-dose Seroquel instead.  Await further recommendations from neurology.  Streptococcus bacteremia/SBP: Continue IV Rocephin-x7 days-ID/GI following.  Cellulitis right foot: Improved-continue IV antibiotics.  AKI: Significant improvement in creatinine-although volume status has improved-still somewhat volume overloaded-continue diuretics.      Hypokalemia: Repleted.  Liver cirrhosis: Diagnosed recently on imaging studies-hepatitis serology/autoimmune work-up negative.  Recent EGD June 2023 did not show any obvious varices.  Given low ceruloplasmin levels-24 urine copper have been sent out-pending at this point.  Appreciate GI assistance.  Spoke with ophthalmologist-Dr. Patel-9/5-almost impossible to see KF rings without a slit-lamp exam-hence ophthalmology referral will need to be done in the outpatient setting.  Spoke with sister-in-law-[name]-no family history of liver issues that she is aware of.   Macrocytic anemia: S/p 1 unit of PRBC on 9/4-no evidence of hemolysis (LDH normal)-no evidence of blood loss.  Suspect this could be related to acute illness.  Continue to follow CBC closely.  Recent folate/B12 levels in June stable.  History of esophageal stricture: S/p dilatation June 2023.  Denies any vomiting or dysphagia symptoms.  History of diabetic gastroparesis: No vomiting-appears stable-continue Reglan.  Encourage small portion meals.  DM-1 (A1c 7.6 on  7/30): No further hypoglycemic episodes-continue low-dose Semglee/SSI and follow.   Recent Labs    12/24/21 2343 12/25/21 0548 12/25/21 0827  GLUCAP 90 112* 124*     HLD: Continue statin  Moderate persistent asthma: Not in flare-continue bronchodilators.  History of chronic urinary retention-s/p self-catheterization at home: Due to worsening encephalopathy-significant urinary retention-indwelling Foley catheter placed on 9/4.  Once mental status improves-we will remove Foley catheter and resume intermittent in/out catheterization.    Chronic pain/fibromyalgia/neuropathy: Neurontin held given unexplained encephalopathy-continue to minimize narcotics as much as possible.    Obesity Estimated body mass index is 30.69 kg/m as calculated from the following:   Height as of this encounter: '5\' 4"'$  (1.626 m).   Weight as of this encounter: 81.1 kg.   Code status:   Code Status: Full Code   DVT Prophylaxis: enoxaparin (LOVENOX) injection 40 mg Start: 12/21/21 1000   Family Communication: Sister-in-law-[name]-[phone]-updated over the phone on 9/5.   Disposition Plan: Status is: Observation The patient will require care spanning > 2 midnights and should be moved to inpatient because: Severe anasarca having difficulty ambulating-Will need inpatient optimization of her volume status before consideration of discharge.   Planned Discharge Destination:Home later this week.   Diet: Diet Order             Diet heart healthy/carb modified Room service appropriate? Yes; Fluid consistency: Thin; Fluid restriction: 1200 mL Fluid  Diet effective now                     Antimicrobial agents: Anti-infectives (From admission, onward)    Start     Dose/Rate Route Frequency Ordered Stop   12/25/21 1030  metroNIDAZOLE (FLAGYL) IVPB 500 mg  500 mg 100 mL/hr over 60 Minutes Intravenous Every 12 hours 12/25/21 0935     12/22/21 0800  cefTRIAXone (ROCEPHIN) 2 g in sodium chloride  0.9 % 100 mL IVPB        2 g 200 mL/hr over 30 Minutes Intravenous Every 24 hours 12/22/21 0236     12/21/21 2100  cefTRIAXone (ROCEPHIN) 1 g in sodium chloride 0.9 % 100 mL IVPB  Status:  Discontinued        1 g 200 mL/hr over 30 Minutes Intravenous Every 24 hours 12/21/21 0055 12/22/21 0236   12/21/21 0030  cefTRIAXone (ROCEPHIN) 1 g in sodium chloride 0.9 % 100 mL IVPB  Status:  Discontinued        1 g 200 mL/hr over 30 Minutes Intravenous  Once 12/21/21 0015 12/21/21 0055   12/20/21 2145  cefTRIAXone (ROCEPHIN) 1 g in sodium chloride 0.9 % 100 mL IVPB  Status:  Discontinued        1 g 200 mL/hr over 30 Minutes Intravenous  Once 12/20/21 2139 12/20/21 2214   12/20/21 0000  cephALEXin (KEFLEX) 500 MG capsule  Status:  Discontinued        500 mg Oral 4 times daily 12/20/21 2319 12/21/21         MEDICATIONS: Scheduled Meds:  sodium chloride   Intravenous Once   dicyclomine  10 mg Oral TID AC & HS   enoxaparin (LOVENOX) injection  40 mg Subcutaneous Q24H   famotidine  40 mg Oral BID   folic acid  2 mg Oral Daily   insulin aspart  0-6 Units Subcutaneous TID WC   insulin glargine-yfgn  6 Units Subcutaneous BID   lactulose  10 g Oral BID   lipase/protease/amylase  36,000 Units Oral TID WC   metoCLOPramide  10 mg Oral TID AC   midodrine  10 mg Oral TID WC   mometasone-formoterol  2 puff Inhalation BID   montelukast  10 mg Oral QHS   multivitamin with minerals  1 tablet Oral Daily   QUEtiapine  25 mg Oral QHS   rosuvastatin  20 mg Oral QHS   sodium chloride flush  3 mL Intravenous Q12H   spironolactone  100 mg Oral Daily   thiamine  500 mg Intravenous Daily   triamcinolone   Topical BID   Continuous Infusions:  albumin human 25 g (12/24/21 2118)   cefTRIAXone (ROCEPHIN)  IV 2 g (12/25/21 1104)   dextrose 5 % and 0.9% NaCl 25 mL/hr at 12/24/21 1457   metronidazole 500 mg (12/25/21 1153)   PRN Meds:.acetaminophen, albuterol, loperamide, naLOXone (NARCAN)  injection, mouth  rinse   I have personally reviewed following labs and imaging studies  LABORATORY DATA: CBC: Recent Labs  Lab 12/20/21 1626 12/20/21 1709 12/21/21 0400 12/22/21 0513 12/23/21 0448 12/24/21 0258 12/24/21 1700 12/25/21 0235  WBC 12.6*  --  13.5* 10.9* 6.6 5.1  --  7.3  NEUTROABS 8.5*  --   --   --   --   --   --   --   HGB 9.3*   < > 9.1* 9.2* 7.2* 6.8* 8.5* 9.3*  HCT 27.8*   < > 27.6* 28.3* 21.8* 21.2* 26.5* 28.4*  MCV 105.3*  --  108.2* 107.2* 105.8* 109.3*  --  101.8*  PLT 219  --  222 222 157 147*  --  159   < > = values in this interval not displayed.     Basic Metabolic Panel: Recent Labs  Lab  12/21/21 0400 12/22/21 0513 12/23/21 0448 12/24/21 0258 12/25/21 0235  NA 131* 132* 140 145 147*  K 3.6 3.4* 3.0* 2.9* 3.6  CL 101 100 106 109 109  CO2 20* 20* '23 25 25  '$ GLUCOSE 185* 176* 112* 79 95  BUN '8 9 7 6 '$ <5*  CREATININE 1.22* 1.29* 1.33* 1.33* 1.19*  CALCIUM 8.4* 8.8* 9.1 9.6 9.8  MG  --   --  1.9  --  2.3  PHOS  --   --   --   --  3.3     GFR: Estimated Creatinine Clearance: 64.8 mL/min (A) (by C-G formula based on SCr of 1.19 mg/dL (H)).  Liver Function Tests: Recent Labs  Lab 12/20/21 1626 12/21/21 0400 12/22/21 0513 12/23/21 0448  AST 39 37 35 31  ALT '30 28 25 18  '$ ALKPHOS 191* 189* 169* 129*  BILITOT 1.6* 1.8* 1.7* 1.1  PROT 5.6* 5.7* 6.2* 5.8*  ALBUMIN 2.3* 2.3* 2.8* 3.3*    No results for input(s): "LIPASE", "AMYLASE" in the last 168 hours. Recent Labs  Lab 12/23/21 0448 12/25/21 0235  AMMONIA 37* 35     Coagulation Profile: No results for input(s): "INR", "PROTIME" in the last 168 hours.  Cardiac Enzymes: No results for input(s): "CKTOTAL", "CKMB", "CKMBINDEX", "TROPONINI" in the last 168 hours.  BNP (last 3 results) No results for input(s): "PROBNP" in the last 8760 hours.  Lipid Profile: No results for input(s): "CHOL", "HDL", "LDLCALC", "TRIG", "CHOLHDL", "LDLDIRECT" in the last 72 hours.  Thyroid Function Tests: No  results for input(s): "TSH", "T4TOTAL", "FREET4", "T3FREE", "THYROIDAB" in the last 72 hours.  Anemia Panel: Recent Labs    12/22/21 1816 12/23/21 0455  VITAMINB12  --  1,027*  FOLATE  --  5.3*  FERRITIN 110  --   TIBC 120*  --   IRON 35  --      Urine analysis:    Component Value Date/Time   COLORURINE YELLOW 12/21/2021 0809   APPEARANCEUR HAZY (A) 12/21/2021 0809   LABSPEC 1.027 12/21/2021 0809   PHURINE 6.0 12/21/2021 0809   GLUCOSEU 50 (A) 12/21/2021 0809   HGBUR NEGATIVE 12/21/2021 0809   BILIRUBINUR NEGATIVE 12/21/2021 0809   KETONESUR NEGATIVE 12/21/2021 0809   PROTEINUR NEGATIVE 12/21/2021 0809   UROBILINOGEN 0.2 04/11/2011 1437   NITRITE NEGATIVE 12/21/2021 0809   LEUKOCYTESUR SMALL (A) 12/21/2021 0809    Sepsis Labs: Lactic Acid, Venous    Component Value Date/Time   LATICACIDVEN 7.5 (HH) 12/05/2021 0238    MICROBIOLOGY: Recent Results (from the past 240 hour(s))  Blood culture (routine x 2)     Status: Abnormal   Collection Time: 12/21/21  4:00 AM   Specimen: BLOOD  Result Value Ref Range Status   Specimen Description BLOOD SITE NOT SPECIFIED  Final   Special Requests   Final    BOTTLES DRAWN AEROBIC AND ANAEROBIC Blood Culture adequate volume   Culture  Setup Time   Final    GRAM POSITIVE COCCI IN CHAINS AEROBIC BOTTLE ONLY Organism ID to follow CRITICAL RESULT CALLED TO, READ BACK BY AND VERIFIED WITH: T RUDISILL,PHARMD'@0229'$  12/22/21 [place]    Culture (A)  Final    STREPTOCOCCUS ANGINOSIS THE SIGNIFICANCE OF ISOLATING THIS ORGANISM FROM A SINGLE VENIPUNCTURE CANNOT BE PREDICTED WITHOUT FURTHER CLINICAL AND CULTURE CORRELATION. SUSCEPTIBILITIES AVAILABLE ONLY ON REQUEST. Performed at [facility], [place] [address], [place], [place] [address]    Report Status 12/24/2021 FINAL  Final  Blood Culture ID Panel (Reflexed)  Status: Abnormal   Collection Time: 12/21/21  4:00 AM  Result Value Ref Range Status   Enterococcus faecalis NOT DETECTED  NOT DETECTED Final   Enterococcus Faecium NOT DETECTED NOT DETECTED Final   Listeria monocytogenes NOT DETECTED NOT DETECTED Final   Staphylococcus species NOT DETECTED NOT DETECTED Final   Staphylococcus aureus (BCID) NOT DETECTED NOT DETECTED Final   Staphylococcus epidermidis NOT DETECTED NOT DETECTED Final   Staphylococcus lugdunensis NOT DETECTED NOT DETECTED Final   Streptococcus species DETECTED (A) NOT DETECTED Final    Comment: Not Enterococcus species, Streptococcus agalactiae, Streptococcus pyogenes, or Streptococcus pneumoniae. CRITICAL RESULT CALLED TO, READ BACK BY AND VERIFIED WITH: T RUDISILL,PHARMD'@0229'$  12/22/21 [place]    Streptococcus agalactiae NOT DETECTED NOT DETECTED Final   Streptococcus pneumoniae NOT DETECTED NOT DETECTED Final   Streptococcus pyogenes NOT DETECTED NOT DETECTED Final   A.calcoaceticus-baumannii NOT DETECTED NOT DETECTED Final   Bacteroides fragilis NOT DETECTED NOT DETECTED Final   Enterobacterales NOT DETECTED NOT DETECTED Final   Enterobacter cloacae complex NOT DETECTED NOT DETECTED Final   Escherichia coli NOT DETECTED NOT DETECTED Final   Klebsiella aerogenes NOT DETECTED NOT DETECTED Final   Klebsiella oxytoca NOT DETECTED NOT DETECTED Final   Klebsiella pneumoniae NOT DETECTED NOT DETECTED Final   Proteus species NOT DETECTED NOT DETECTED Final   Salmonella species NOT DETECTED NOT DETECTED Final   Serratia marcescens NOT DETECTED NOT DETECTED Final   Haemophilus influenzae NOT DETECTED NOT DETECTED Final   Neisseria meningitidis NOT DETECTED NOT DETECTED Final   Pseudomonas aeruginosa NOT DETECTED NOT DETECTED Final   Stenotrophomonas maltophilia NOT DETECTED NOT DETECTED Final   Candida albicans NOT DETECTED NOT DETECTED Final   Candida auris NOT DETECTED NOT DETECTED Final   Candida glabrata NOT DETECTED NOT DETECTED Final   Candida krusei NOT DETECTED NOT DETECTED Final   Candida parapsilosis NOT DETECTED NOT DETECTED Final   Candida  tropicalis NOT DETECTED NOT DETECTED Final   Cryptococcus neoformans/gattii NOT DETECTED NOT DETECTED Final    Comment: Performed at [facility], [address] [place], [place] [address]  Blood culture (routine x 2)     Status: None (Preliminary result)   Collection Time: 12/21/21  4:08 AM   Specimen: BLOOD  Result Value Ref Range Status   Specimen Description BLOOD SITE NOT SPECIFIED  Final   Special Requests   Final    BOTTLES DRAWN AEROBIC ONLY Blood Culture results may not be optimal due to an inadequate volume of blood received in culture bottles   Culture   Final    NO GROWTH 4 DAYS Performed at [facility], [place] [address], [place], [place] [address]    Report Status PENDING  Incomplete  Culture, body fluid w Gram Stain-bottle     Status: None (Preliminary result)   Collection Time: 12/21/21  3:21 PM   Specimen: Fluid  Result Value Ref Range Status   Specimen Description FLUID PERITONEAL  Final   Special Requests BOTTLES DRAWN AEROBIC AND ANAEROBIC  Final   Culture   Final    NO GROWTH 4 DAYS Performed at [facility], [place] [address] [place], [place] [address]    Report Status PENDING  Incomplete  Gram stain     Status: None   Collection Time: 12/21/21  3:21 PM   Specimen: Fluid  Result Value Ref Range Status   Specimen Description FLUID PERITONEAL  Final   Special Requests NONE  Final   Gram  Stain   Final    WBC PRESENT,BOTH PMN AND MONONUCLEAR NO ORGANISMS SEEN CYTOSPIN SMEAR Performed at [facility], [place] [address], [place], [place] [address]    Report Status 12/21/2021 FINAL  Final  Culture, blood (Routine X 2) w Reflex to ID Panel     Status: None (Preliminary result)   Collection Time: 12/22/21  6:16 PM   Specimen: BLOOD RIGHT HAND  Result Value Ref Range Status   Specimen Description BLOOD RIGHT HAND  Final   Special Requests   Final    BOTTLES DRAWN AEROBIC AND ANAEROBIC Blood Culture results may not be optimal due to an  inadequate volume of blood received in culture bottles   Culture   Final    NO GROWTH 3 DAYS Performed at [facility], [place] [address] [place], [place] [address]    Report Status PENDING  Incomplete  Culture, blood (Routine X 2) w Reflex to ID Panel     Status: None (Preliminary result)   Collection Time: 12/22/21  6:16 PM   Specimen: BLOOD  Result Value Ref Range Status   Specimen Description BLOOD SITE NOT SPECIFIED  Final   Special Requests   Final    BOTTLES DRAWN AEROBIC AND ANAEROBIC Blood Culture adequate volume   Culture   Final    NO GROWTH 3 DAYS Performed at [facility], [address] [place], [place] [address]    Report Status PENDING  Incomplete    RADIOLOGY STUDIES/RESULTS: DG CHEST PORT 1 VIEW  Result Date: 12/25/2021 CLINICAL DATA:  Hypoxia. EXAM: PORTABLE CHEST 1 VIEW COMPARISON:  12/20/2021 FINDINGS: 0604 hours. Low volume film. Diffuse interstitial and hazy parahilar opacity bilaterally is new in the interval. Probable atelectasis in the lung bases. No substantial pleural effusion. Cardiopericardial silhouette is at upper limits of normal for size. The visualized bony structures of the thorax are unremarkable. Telemetry leads overlie the chest. IMPRESSION: Interval development of diffuse interstitial and hazy parahilar opacity. Imaging features suggest edema. Electronically Signed   By: Misty Stanley M.D.   On: 12/25/2021 06:28   MR BRAIN WO CONTRAST  Result Date: 12/25/2021 CLINICAL DATA:  Initial evaluation for mental status change, unknown cause. EXAM: MRI HEAD WITHOUT CONTRAST TECHNIQUE: Multiplanar, multiecho pulse sequences of the brain and surrounding structures were obtained without intravenous contrast. COMPARISON:  Prior CT from 12/09/2021. FINDINGS: Brain: Examination technically limited as the patient was unable to tolerate the exam. Axial and coronal DWI sequences only were performed. Diffusion-weighted imaging demonstrates no evidence for  acute or subacute infarct. No visible mass lesion, mass effect, or midline shift. No visible areas of chronic cortical infarction. Ventricles normal size without hydrocephalus. No extra-axial fluid collection. Vascular: Not well assessed on this limited exam. Skull and upper cervical spine: Not well assessed on this limited exam. Sinuses/Orbits: Not well assessed on this limited exam. Other: None. IMPRESSION: 1. Technically limited study due to the patient's inability to tolerate the exam. Axial and coronal DWI sequences only were performed. 2. No acute or subacute intracranial infarct. No other definite intracranial abnormality on this limited exam. Electronically Signed   By: Jeannine Boga M.D.   On: 12/25/2021 01:54   VAS [place] UPPER EXTREMITY VENOUS DUPLEX  Result Date: 12/24/2021 UPPER VENOUS STUDY  Patient Name:  CHARMANE PROTZMAN  Date of Exam:   12/24/2021 Medical Rec #: [id number]     Accession #:    [id number] Date of Birth: 1980-09-29  Patient Gender: F Patient Age:   24 years Exam Location:  [facility] Procedure:      VAS [place] UPPER EXTREMITY VENOUS DUPLEX Referring Phys: Oren Binet --------------------------------------------------------------------------------  Indications: Swelling Limitations: Patient unable to follow commands, restraints, bandaging. Comparison Study: No prior studies. Performing Technologist: Darlin Coco RDMS, RVT  Examination Guidelines: A complete evaluation includes B-mode imaging, spectral Doppler, color Doppler, and power Doppler as needed of all accessible portions of each vessel. Bilateral testing is considered an integral part of a complete examination. Limited examinations for reoccurring indications may be performed as noted.  Right Findings: +----------+------------+---------+-----------+----------+---------------------+ RIGHT     CompressiblePhasicitySpontaneousProperties       Summary         +----------+------------+---------+-----------+----------+---------------------+ IJV           Full       Yes       Yes                                    +----------+------------+---------+-----------+----------+---------------------+ Subclavian               Yes       Yes                                    +----------+------------+---------+-----------+----------+---------------------+ Axillary      Full       Yes       Yes                                    +----------+------------+---------+-----------+----------+---------------------+ Brachial      Full                                                        +----------+------------+---------+-----------+----------+---------------------+ Radial        Full                                                        +----------+------------+---------+-----------+----------+---------------------+ Ulnar                                                Unable to visualize                                                       due to positioning   +----------+------------+---------+-----------+----------+---------------------+ Cephalic      None       No        No                       Acute         +----------+------------+---------+-----------+----------+---------------------+ Basilic  Unable to visualize                                                       due to positioning   +----------+------------+---------+-----------+----------+---------------------+  Left Findings: +----------+------------+---------+-----------+----------+-------+ LEFT      CompressiblePhasicitySpontaneousPropertiesSummary +----------+------------+---------+-----------+----------+-------+ IJV                      Yes       Yes                      +----------+------------+---------+-----------+----------+-------+ Subclavian               Yes       Yes                       +----------+------------+---------+-----------+----------+-------+ Axillary      Full       Yes       Yes                      +----------+------------+---------+-----------+----------+-------+ Brachial      Full                                          +----------+------------+---------+-----------+----------+-------+ Radial        Full                                          +----------+------------+---------+-----------+----------+-------+ Ulnar         Full                                          +----------+------------+---------+-----------+----------+-------+ Cephalic      None       No        No                Acute  +----------+------------+---------+-----------+----------+-------+ Basilic       Full                                          +----------+------------+---------+-----------+----------+-------+  Summary:  Right: No evidence of deep vein thrombosis in the upper extremity. Findings consistent with acute superficial vein thrombosis involving the right cephalic vein.  Left: No evidence of deep vein thrombosis in the upper extremity. Findings consistent with acute superficial vein thrombosis involving the left cephalic vein. However, unable to visualize the basilic vein and ulnar veins.  *See table(s) above for measurements and observations.  Diagnosing physician: Monica Martinez MD Electronically signed by Monica Martinez MD on 12/24/2021 at 5:06:18 PM.    Final    EEG adult  Result Date: 12/24/2021 Lora Havens, MD     12/24/2021  3:34 PM Patient Name: HERTHA GERGEN MRN: [id number] Epilepsy Attending: Lora Havens Referring Physician/Provider: Jonetta Osgood, MD Date: 12/24/2021 Duration: 22.08 mins Patient history: 41 year old female with altered mental status.  EEG to evaluate for seizure. Level of alertness: Awake AEDs during EEG study: None Technical aspects: This EEG study was done with scalp electrodes positioned according to the  10-20 International system of electrode placement. Electrical activity was reviewed with band pass filter of 1-'70Hz'$ , sensitivity of 7 uV/mm, display speed of 52m/sec with a '60Hz'$  notched filter applied as appropriate. EEG data were recorded continuously and digitally stored.  Video monitoring was available and reviewed as appropriate. Description: No posterior dominant rhythm was seen. EEG showed continuous generalized 3 to 6 Hz theta-delta slowing. Hyperventilation and photic stimulation were not performed.   ABNORMALITY - Continuous slow, generalized IMPRESSION: This study is suggestive of moderate diffuse encephalopathy, nonspecific etiology. No seizures or epileptiform discharges were seen throughout the recording. [place]    LOS: 4 days   SOren Binet MD  [facility]    To contact the attending provider between 7A-7P or the covering provider during after hours 7P-7A, please log into the web site [email or web address] and access using universal [place] password for that web site. If you do not have the password, please call the hospital operator.  12/25/2021, 12:09 PM

## 2021-12-25 NOTE — Progress Notes (Signed)
CSW acknowledges consult for substance use. CSW following to complete consult when appropriate. CSW will continue to follow.

## 2021-12-25 NOTE — Progress Notes (Signed)
Pt reviewed by doc on call plan to be transferred out to 6 E. Pt left resting in bed nil complaints on vetri mask at 93%Observation continues.

## 2021-12-25 NOTE — Progress Notes (Signed)
Overnight progress note  Notified by RN that patient desatted to 79-80% after she was given Ativan 2 mg for agitation.  Placed on supplemental oxygen and it was titrated up to 6 L without improvement and sats remaining at 80%.  Subsequently placed on NRB and sats improved to 100%.  Temperature 100.2 F rectal, heart rate 101, respiratory rate 25, blood pressure 117/71.    Patient seen at bedside.  Now on Ventimask 0.55 and maintaining sats in the 90s.  Restless and moving all extremities spontaneously.  When I called her name, she responded by saying yes.  She had her eyes closed and when I requested her to open her eyes, she started screaming.  Unable to do physical exam as patient pushed me away.  Noted to have edema of bilateral lower extremities.  After patient calmed down, EKG was done which showed sinus tachycardia, QTc 503.  No significant change since prior tracing.    Stat chest x-ray done and showing pulmonary edema.  -Creatinine improved on labs this morning, IV Lasix 40 mg x 1 given -Avoid narcotics and benzodiazepines -Continue supplemental oxygen, wean as tolerated -Transfer to progressive care unit for closer monitoring -Potassium and magnesium normal on labs done this morning.  Avoid QT prolonging drugs if possible.  She is on Reglan for diabetic gastroparesis, monitor electrolytes closely.

## 2021-12-25 NOTE — Consult Note (Signed)
Chief Complaint: Patient was seen in consultation today for random liver biopsy Chief Complaint  Patient presents with   Leg Swelling   at the request of Harl Bowie  Referring Physician(s): Harl Bowie  Supervising Physician: Aletta Edouard  Patient Status: [facility] - In-pt  History of Present Illness: Anna Garcia is a 41 y.o. female with PMHs of HTN, T1DM, diabetes with gastroparesis, chronic pain syndrome/fibromyalgia, depression/anxiety, and recent diagnosis of cirrhosis decompensated with anasarca/ascites, SBP who is in need of random liver biopsy.   Patient presented to the ED for evaluation of lower extremity edema on 9/1, underwent paracentesis which showed elevated neutrophil, cx and gram stain were negative. Blood cx came back positive. ID and GI has been following, she was started on SBP prophylaxis. Repeat blood cx obtained on 9/2 showed no growth x 3 days. Of note, rapid response was called on 9/5 due to patient becoming hypoxic after 2 mg Ativan was given for agitation/hallucination, she also is on Flagyl for possible aspiration component with her respiratory failure .   Viral hepatitis serologies and autoimmune markers for liver disease were negative, she is also being evaluated for possible Wilson's disease as she has low ceruloplasmin, 24-hour urine copper collection pending. Random liver bx was recommended to further evaluate the etiology of recently diagnosed decompensating cirrhosis.   Case was reviewed by Dr. Kathlene Cote, approved for random liver biopsy, most likely via transjugular approach due to SBP and ascites.   Patient laying in bed, not in acute distress, appears to be sad and irritated. RN at bedside.  Patient oriented to self only, ROS not obtained.   Past Medical History:  Diagnosis Date   Diabetes mellitus    Gastroparesis    Hypertension    Neuropathy     Past Surgical History:  Procedure Laterality Date   ESOPHAGOGASTRODUODENOSCOPY  (EGD) WITH PROPOFOL N/A 09/24/2021   Procedure: ESOPHAGOGASTRODUODENOSCOPY (EGD) WITH PROPOFOL;  Surgeon: Clarene Essex, MD;  Location: [facility];  Service: Gastroenterology;  Laterality: N/A;   IR PARACENTESIS  12/21/2021   SAVORY DILATION N/A 09/24/2021   Procedure: SAVORY DILATION - 16 MM;  Surgeon: Clarene Essex, MD;  Location: [facility];  Service: Gastroenterology;  Laterality: N/A;    Allergies: Patient has no known allergies.  Medications: Prior to Admission medications   Medication Sig Start Date End Date Taking? Authorizing Provider  DEXILANT 60 MG capsule Take 1 capsule (60 mg total) by mouth daily. 09/26/21  Yes Regalado, Belkys A, MD  dicyclomine (BENTYL) 10 MG capsule Take 1 capsule (10 mg total) by mouth 4 (four) times daily -  before meals and at bedtime. 12/11/21  Yes Samuella Cota, MD  famotidine (PEPCID) 40 MG tablet Take 40 mg by mouth 2 (two) times daily. 05/28/19  Yes [provider]  ferrous sulfate 325 (65 FE) MG tablet Take 1 tablet by mouth daily with breakfast. 12/19/21  Yes Adhikari, Amrit, MD  gabapentin (NEURONTIN) 300 MG capsule Take 1 capsule (300 mg total) by mouth 3 (three) times daily. 05/28/21  Yes Eugenie Filler, MD  granisetron (KYTRIL) 1 MG tablet Take 1 mg by mouth every 12 (twelve) hours. 11/05/21  Yes [provider]  insulin glargine (LANTUS SOLOSTAR) 100 UNIT/ML Solostar Pen Inject 8 Units into the skin 2 times daily. 12/18/21  Yes Adhikari, Tamsen Meek, MD  insulin glulisine (APIDRA SOLOSTAR) 100 UNIT/ML Solostar Pen Inject 0 - 8 Units into the skin daily AS NEEDED if blood glucose levels are trending upwards- changes as  per carb intake Patient taking differently: Inject 0-8 Units into the skin daily as needed (for glucose levels are trending upward- changes carbs intake). 12/18/21  Yes Shelly Coss, MD  lipase/protease/amylase (CREON) 36000 UNITS CPEP capsule Take 1 capsule (36,000 Units total) by mouth 3 (three) times daily with meals.  12/11/21  Yes Samuella Cota, MD  loperamide (IMODIUM) 2 MG capsule Take 1 capsule (2 mg total) by mouth as needed for diarrhea or loose stools. Patient taking differently: Take 2 mg by mouth daily as needed for diarrhea or loose stools. 09/26/21  Yes Regalado, Belkys A, MD  metoCLOPramide (REGLAN) 10 MG tablet Take 1 tablet by mouth every 6 hours as needed for nausea. 12/18/21 01/17/22 Yes Adhikari, Tamsen Meek, MD  mometasone-formoterol (DULERA) 100-5 MCG/ACT AERO Inhale 2 puffs into the lungs 2 (two) times daily. 07/07/20  Yes Mercy Riding, MD  montelukast (SINGULAIR) 10 MG tablet Take 10 mg by mouth at bedtime. 05/24/19  Yes [provider]  MOTEGRITY 1 MG TABS Take 1 mg by mouth daily. 08/31/21  Yes [provider]  rosuvastatin (CRESTOR) 20 MG tablet Take 20 mg by mouth at bedtime. 05/24/19  Yes [provider]  BAQSIMI TWO PACK 3 MG/DOSE POWD Place 1 spray into both nostrils as needed (for a very low blood sugar emergency, when unable to eat or drink and need help from someone else- or as otherwise directed). 10/30/21   [provider]  diphenhydrAMINE (BENADRYL ALLERGY) 25 mg capsule Take 1 capsule (25 mg total) by mouth every 6 (six) hours as needed. Patient not taking: Reported on 12/21/2021 12/18/21   Shelly Coss, MD  hydrocortisone cream 0.5 % Apply 1 Application topically 2 (two) times daily. Apply on rash on hands Patient not taking: Reported on 12/21/2021 12/18/21   Shelly Coss, MD  insulin aspart (NOVOLOG) 100 UNIT/ML injection Inject 3 units three times a day with meals if you eat over 50% of your meals. In addition, use sliding scale as follows: CBG 70 - 120: 0 units CBG 121 - 150: 2 units CBG 151 - 200: 3 units CBG 201 - 250: 5 units CBG 251 - 300: 8 units CBG 301 - 350: 11 units CBG 351 - 400: 15 units CBG > 400: call MD and obtain STAT lab verification Patient not taking: No sig reported 07/07/20 10/13/20  Mercy Riding, MD     Family History  Problem  Relation Age of Onset   Hypertension Mother    Dementia Mother    Heart attack Father    Hypertension Father    Heart disease Father    Hypertension Brother    Stroke Maternal Grandfather     Social History   Socioeconomic History   Marital status: Single    Spouse name: Not on file   Number of children: Not on file   Years of education: Not on file   Highest education level: Not on file  Occupational History   Not on file  Tobacco Use   Smoking status: Former    Packs/day: 0.50    Types: Cigarettes   Smokeless tobacco: Never  Vaping Use   Vaping Use: Never used  Substance and Sexual Activity   Alcohol use: No   Drug use: No   Sexual activity: Not on file  Other Topics Concern   Not on file  Social History Narrative   Not on file   Social Determinants of Health   Financial Resource Strain: Not on file  Food Insecurity: Not on file  Transportation Needs: Not on file  Physical Activity: Not on file  Stress: Not on file  Social Connections: Not on file     Review of Systems: A 12 point ROS discussed and pertinent positives are indicated in the HPI above.  All other systems are negative.  Vital Signs: BP 101/68 (BP Location: Left Arm)   Pulse (!) 107   Temp 98.7 F (37.1 C) (Oral)   Resp 20   Ht '5\' 4"'$  (1.626 m)   Wt 178 lb 12.7 oz (81.1 kg)   SpO2 91%   BMI 30.69 kg/m    Physical Exam Vitals reviewed.  Constitutional:      General: She is not in acute distress.    Appearance: She is ill-appearing.  HENT:     Head: Normocephalic.     Mouth/Throat:     Mouth: Mucous membranes are moist.     Pharynx: Oropharynx is clear.  Cardiovascular:     Rate and Rhythm: Normal rate and regular rhythm.     Heart sounds: Normal heart sounds.  Pulmonary:     Effort: Pulmonary effort is normal.     Breath sounds: Normal breath sounds.  Abdominal:     General: Abdomen is flat. Bowel sounds are normal.     Palpations: Abdomen is soft.  Musculoskeletal:      Cervical back: Neck supple.  Skin:    General: Skin is warm and dry.     Coloration: Skin is not jaundiced or pale.  Neurological:     Mental Status: She is alert.     Comments: Oriented to self only   Psychiatric:     Comments: Appears sad and irritated      MD Evaluation Airway: WNL Heart: WNL Abdomen: WNL Chest/ Lungs: WNL ASA  Classification: 3 Mallampati/Airway Score: Two  Imaging: DG CHEST PORT 1 VIEW  Result Date: 12/25/2021 CLINICAL DATA:  Hypoxia. EXAM: PORTABLE CHEST 1 VIEW COMPARISON:  12/20/2021 FINDINGS: 0604 hours. Low volume film. Diffuse interstitial and hazy parahilar opacity bilaterally is new in the interval. Probable atelectasis in the lung bases. No substantial pleural effusion. Cardiopericardial silhouette is at upper limits of normal for size. The visualized bony structures of the thorax are unremarkable. Telemetry leads overlie the chest. IMPRESSION: Interval development of diffuse interstitial and hazy parahilar opacity. Imaging features suggest edema. Electronically Signed   By: Misty Stanley M.D.   On: 12/25/2021 06:28   MR BRAIN WO CONTRAST  Result Date: 12/25/2021 CLINICAL DATA:  Initial evaluation for mental status change, unknown cause. EXAM: MRI HEAD WITHOUT CONTRAST TECHNIQUE: Multiplanar, multiecho pulse sequences of the brain and surrounding structures were obtained without intravenous contrast. COMPARISON:  Prior CT from 12/09/2021. FINDINGS: Brain: Examination technically limited as the patient was unable to tolerate the exam. Axial and coronal DWI sequences only were performed. Diffusion-weighted imaging demonstrates no evidence for acute or subacute infarct. No visible mass lesion, mass effect, or midline shift. No visible areas of chronic cortical infarction. Ventricles normal size without hydrocephalus. No extra-axial fluid collection. Vascular: Not well assessed on this limited exam. Skull and upper cervical spine: Not well assessed on this limited  exam. Sinuses/Orbits: Not well assessed on this limited exam. Other: None. IMPRESSION: 1. Technically limited study due to the patient's inability to tolerate the exam. Axial and coronal DWI sequences only were performed. 2. No acute or subacute intracranial infarct. No other definite intracranial abnormality on this limited exam. Electronically Signed   By: Marland Kitchen  Jeannine Boga M.D.   On: 12/25/2021 01:54   VAS [place] UPPER EXTREMITY VENOUS DUPLEX  Result Date: 12/24/2021 UPPER VENOUS STUDY  Patient Name:  Anna Garcia  Date of Exam:   12/24/2021 Medical Rec #: [id number]     Accession #:    [id number] Date of Birth: 03/14/1981      Patient Gender: F Patient Age:   38 years Exam Location:  [facility] Procedure:      VAS [place] UPPER EXTREMITY VENOUS DUPLEX Referring Phys: Oren Binet --------------------------------------------------------------------------------  Indications: Swelling Limitations: Patient unable to follow commands, restraints, bandaging. Comparison Study: No prior studies. Performing Technologist: Darlin Coco RDMS, RVT  Examination Guidelines: A complete evaluation includes B-mode imaging, spectral Doppler, color Doppler, and power Doppler as needed of all accessible portions of each vessel. Bilateral testing is considered an integral part of a complete examination. Limited examinations for reoccurring indications may be performed as noted.  Right Findings: +----------+------------+---------+-----------+----------+---------------------+ RIGHT     CompressiblePhasicitySpontaneousProperties       Summary        +----------+------------+---------+-----------+----------+---------------------+ IJV           Full       Yes       Yes                                    +----------+------------+---------+-----------+----------+---------------------+ Subclavian               Yes       Yes                                     +----------+------------+---------+-----------+----------+---------------------+ Axillary      Full       Yes       Yes                                    +----------+------------+---------+-----------+----------+---------------------+ Brachial      Full                                                        +----------+------------+---------+-----------+----------+---------------------+ Radial        Full                                                        +----------+------------+---------+-----------+----------+---------------------+ Ulnar                                                Unable to visualize                                                       due to positioning   +----------+------------+---------+-----------+----------+---------------------+  Cephalic      None       No        No                       Acute         +----------+------------+---------+-----------+----------+---------------------+ Basilic                                              Unable to visualize                                                       due to positioning   +----------+------------+---------+-----------+----------+---------------------+  Left Findings: +----------+------------+---------+-----------+----------+-------+ LEFT      CompressiblePhasicitySpontaneousPropertiesSummary +----------+------------+---------+-----------+----------+-------+ IJV                      Yes       Yes                      +----------+------------+---------+-----------+----------+-------+ Subclavian               Yes       Yes                      +----------+------------+---------+-----------+----------+-------+ Axillary      Full       Yes       Yes                      +----------+------------+---------+-----------+----------+-------+ Brachial      Full                                           +----------+------------+---------+-----------+----------+-------+ Radial        Full                                          +----------+------------+---------+-----------+----------+-------+ Ulnar         Full                                          +----------+------------+---------+-----------+----------+-------+ Cephalic      None       No        No                Acute  +----------+------------+---------+-----------+----------+-------+ Basilic       Full                                          +----------+------------+---------+-----------+----------+-------+  Summary:  Right: No evidence of deep vein thrombosis in the upper extremity. Findings consistent with acute superficial vein thrombosis involving the right cephalic vein.  Left: No evidence of deep vein thrombosis in the upper extremity. Findings consistent with acute  superficial vein thrombosis involving the left cephalic vein. However, unable to visualize the basilic vein and ulnar veins.  *See table(s) above for measurements and observations.  Diagnosing physician: Monica Martinez MD Electronically signed by Monica Martinez MD on 12/24/2021 at 5:06:18 PM.    Final    EEG adult  Result Date: 12/24/2021 Lora Havens, MD     12/24/2021  3:34 PM Patient Name: Anna Garcia MRN: [id number] Epilepsy Attending: Lora Havens Referring Physician/Provider: Jonetta Osgood, MD Date: 12/24/2021 Duration: 22.08 mins Patient history: 41 year old female with altered mental status.  EEG to evaluate for seizure. Level of alertness: Awake AEDs during EEG study: None Technical aspects: This EEG study was done with scalp electrodes positioned according to the 10-20 International system of electrode placement. Electrical activity was reviewed with band pass filter of 1-'70Hz'$ , sensitivity of 7 uV/mm, display speed of 61m/sec with a '60Hz'$  notched filter applied as appropriate. EEG data were recorded continuously and digitally stored.   Video monitoring was available and reviewed as appropriate. Description: No posterior dominant rhythm was seen. EEG showed continuous generalized 3 to 6 Hz theta-delta slowing. Hyperventilation and photic stimulation were not performed.   ABNORMALITY - Continuous slow, generalized IMPRESSION: This study is suggestive of moderate diffuse encephalopathy, nonspecific etiology. No seizures or epileptiform discharges were seen throughout the recording. Priyanka OBarbra Sarks  IR Paracentesis  Result Date: 12/21/2021 INDICATION: Patient with history of cirrhosis, previous ultrasound showed ascites. Request for therapeutic and diagnostic paracentesis up to 1 L. EXAM: ULTRASOUND GUIDED DIAGNOSTIC AND THERAPEUTIC PARACENTESIS MEDICATIONS: 10 mL 1% lidocaine COMPLICATIONS: None immediate. PROCEDURE: Informed written consent was obtained from the patient after a discussion of the risks, benefits and alternatives to treatment. A timeout was performed prior to the initiation of the procedure. Initial ultrasound scanning demonstrates a small amount of ascites within the right lower abdominal quadrant. The right lower abdomen was prepped and draped in the usual sterile fashion. 1% lidocaine was used for local anesthesia. Following this, a 19 gauge, 7-cm, Yueh catheter was introduced. An ultrasound image was saved for documentation purposes. The paracentesis was performed. The catheter was removed and a dressing was applied. The patient tolerated the procedure well without immediate post procedural complication. FINDINGS: A total of approximately 1 L of hazy yellow fluid was removed. Samples were sent to the laboratory as requested by the clinical team. IMPRESSION: Successful ultrasound-guided paracentesis yielding 1 L of peritoneal fluid. Read by: ADurenda Guthrie PA-C PLAN: If the patient eventually requires >/=2 paracenteses in a 30 day period, candidacy for formal evaluation by the [facility] will be assessed. JMichaelle Birks MD [facility] Electronically Signed   By: JMichaelle BirksM.D.   On: 12/21/2021 15:39   [place]domen Limited  Result Date: 12/21/2021 CLINICAL DATA:  Ascites EXAM: LIMITED ABDOMEN ULTRASOUND FOR ASCITES TECHNIQUE: Limited ultrasound survey for ascites was performed in all four abdominal quadrants. COMPARISON:  CT abdomen and pelvis 12/21/2018. FINDINGS: Moderate volume ascites demonstrated within all 4 quadrants. IMPRESSION: Moderate volume abdominal ascites. Electronically Signed   By: DLovey NewcomerM.D.   On: 12/21/2021 11:59   CT Abdomen Pelvis W Contrast  Result Date: 12/20/2021 CLINICAL DATA:  Chest pain abdominal pain EXAM: CT ANGIOGRAPHY CHEST CT ABDOMEN AND PELVIS WITH CONTRAST TECHNIQUE: Multidetector CT imaging of the chest was performed using the standard protocol during bolus administration of intravenous contrast. Multiplanar CT image reconstructions and MIPs were obtained to  evaluate the vascular anatomy. Multidetector CT imaging of the abdomen and pelvis was performed using the standard protocol during bolus administration of intravenous contrast. RADIATION DOSE REDUCTION: This exam was performed according to the departmental dose-optimization program which includes automated exposure control, adjustment of the mA and/or kV according to patient size and/or use of iterative reconstruction technique. CONTRAST:  119m OMNIPAQUE IOHEXOL 350 MG/ML SOLN COMPARISON:  12/06/2021, chest x-ray 12/20/2021, CT chest 04/11/2011 FINDINGS: CTA CHEST FINDINGS Cardiovascular: Satisfactory opacification of the pulmonary arteries to the segmental level. No evidence of pulmonary embolism. Normal heart size. No pericardial effusion. Nonaneurysmal aorta. No dissection is seen. Mediastinum/Nodes: No enlarged mediastinal, hilar, or axillary lymph nodes. Thyroid gland, trachea, and esophagus demonstrate no  significant findings. Lungs/Pleura: Subsegmental atelectasis in the bases. No acute airspace disease, pleural effusion or pneumothorax Musculoskeletal: No chest wall abnormality. No acute or significant osseous findings. Review of the MIP images confirms the above findings. CT ABDOMEN and PELVIS FINDINGS Hepatobiliary: Liver cirrhosis. Slightly distorted appearance of the central parenchyma. Prominent central liver enhancement. Contracted gallbladder without calcified stone. Pancreas: No inflammation. Suspect significant atrophy. Not much pancreatic tissue identified. Spleen: Normal in size without focal abnormality. Adrenals/Urinary Tract: Adrenal glands are unremarkable. Kidneys are normal, without renal calculi, focal lesion, or hydronephrosis. Bladder is unremarkable. Stomach/Bowel: Stomach is nonenlarged. No dilated small bowel. Multiple thickened small bowel loops most evident in the left upper quadrant involving the jejunum. There is diffuse colon wall thickening. Vascular/Lymphatic: Nonaneurysmal aorta. Mild atherosclerosis. Subcentimeter retroperitoneal lymph nodes. Upper intra hepatic IVC appears slightly narrowed. Reproductive: IUD in the uterus.  No adnexal mass. Other: Moderate ascites within the abdomen and pelvis. No free air. Generalized edema consistent with anasarca. Musculoskeletal: No acute osseous abnormality. Review of the MIP images confirms the above findings. IMPRESSION: 1. No definite CT evidence for acute pulmonary embolus. Subsegmental atelectasis at the bases. 2. Liver cirrhosis with distorted appearance of the central parenchyma suggestive of fibrosis. When the patient is clinically stable and able to follow directions and hold their breath (preferably as an outpatient) further evaluation with dedicated abdominal MRI should be considered. 3. Prominent small bowel thickening and diffuse colon wall thickening. Differential considerations include enterocolitis versus nonspecific bowel  edema secondary to edematous state or hypoproteinemia. 4. Moderate volume of abdominopelvic ascites. Generalized subcutaneous edema consistent with anasarca. Electronically Signed   By: KDonavan FoilM.D.   On: 12/20/2021 19:46   CT Angio Chest PE W/Cm &/Or Wo Cm  Result Date: 12/20/2021 CLINICAL DATA:  Chest pain abdominal pain EXAM: CT ANGIOGRAPHY CHEST CT ABDOMEN AND PELVIS WITH CONTRAST TECHNIQUE: Multidetector CT imaging of the chest was performed using the standard protocol during bolus administration of intravenous contrast. Multiplanar CT image reconstructions and MIPs were obtained to evaluate the vascular anatomy. Multidetector CT imaging of the abdomen and pelvis was performed using the standard protocol during bolus administration of intravenous contrast. RADIATION DOSE REDUCTION: This exam was performed according to the departmental dose-optimization program which includes automated exposure control, adjustment of the mA and/or kV according to patient size and/or use of iterative reconstruction technique. CONTRAST:  1067mOMNIPAQUE IOHEXOL 350 MG/ML SOLN COMPARISON:  12/06/2021, chest x-ray 12/20/2021, CT chest 04/11/2011 FINDINGS: CTA CHEST FINDINGS Cardiovascular: Satisfactory opacification of the pulmonary arteries to the segmental level. No evidence of pulmonary embolism. Normal heart size. No pericardial effusion. Nonaneurysmal aorta. No dissection is seen. Mediastinum/Nodes: No enlarged mediastinal, hilar, or axillary lymph nodes. Thyroid gland, trachea, and esophagus demonstrate no significant findings. Lungs/Pleura: Subsegmental atelectasis in  the bases. No acute airspace disease, pleural effusion or pneumothorax Musculoskeletal: No chest wall abnormality. No acute or significant osseous findings. Review of the MIP images confirms the above findings. CT ABDOMEN and PELVIS FINDINGS Hepatobiliary: Liver cirrhosis. Slightly distorted appearance of the central parenchyma. Prominent central liver  enhancement. Contracted gallbladder without calcified stone. Pancreas: No inflammation. Suspect significant atrophy. Not much pancreatic tissue identified. Spleen: Normal in size without focal abnormality. Adrenals/Urinary Tract: Adrenal glands are unremarkable. Kidneys are normal, without renal calculi, focal lesion, or hydronephrosis. Bladder is unremarkable. Stomach/Bowel: Stomach is nonenlarged. No dilated small bowel. Multiple thickened small bowel loops most evident in the left upper quadrant involving the jejunum. There is diffuse colon wall thickening. Vascular/Lymphatic: Nonaneurysmal aorta. Mild atherosclerosis. Subcentimeter retroperitoneal lymph nodes. Upper intra hepatic IVC appears slightly narrowed. Reproductive: IUD in the uterus.  No adnexal mass. Other: Moderate ascites within the abdomen and pelvis. No free air. Generalized edema consistent with anasarca. Musculoskeletal: No acute osseous abnormality. Review of the MIP images confirms the above findings. IMPRESSION: 1. No definite CT evidence for acute pulmonary embolus. Subsegmental atelectasis at the bases. 2. Liver cirrhosis with distorted appearance of the central parenchyma suggestive of fibrosis. When the patient is clinically stable and able to follow directions and hold their breath (preferably as an outpatient) further evaluation with dedicated abdominal MRI should be considered. 3. Prominent small bowel thickening and diffuse colon wall thickening. Differential considerations include enterocolitis versus nonspecific bowel edema secondary to edematous state or hypoproteinemia. 4. Moderate volume of abdominopelvic ascites. Generalized subcutaneous edema consistent with anasarca. Electronically Signed   By: Donavan Foil M.D.   On: 12/20/2021 19:46   DG Chest Port 1 View  Result Date: 12/20/2021 CLINICAL DATA:  Shortness of breath. EXAM: PORTABLE CHEST 1 VIEW COMPARISON:  Chest x-ray 12/04/2021 FINDINGS: The heart size and mediastinal  contours are within normal limits. Both lungs are clear. The visualized skeletal structures are unremarkable. IMPRESSION: No active disease. Electronically Signed   By: Ronney Asters M.D.   On: 12/20/2021 17:21   CT HEAD WO CONTRAST (5MM)  Result Date: 12/17/2021 CLINICAL DATA:  Delirium. Seated fall last night without head injury. EXAM: CT HEAD WITHOUT CONTRAST TECHNIQUE: Contiguous axial images were obtained from the base of the skull through the vertex without intravenous contrast. RADIATION DOSE REDUCTION: This exam was performed according to the departmental dose-optimization program which includes automated exposure control, adjustment of the mA and/or kV according to patient size and/or use of iterative reconstruction technique. COMPARISON:  None Available. FINDINGS: Brain: No evidence of acute infarction, hemorrhage, hydrocephalus, extra-axial collection or mass lesion/mass effect. Vascular: No hyperdense vessel or unexpected calcification. Skull: Normal. Negative for fracture or focal lesion. Sinuses/Orbits: No acute finding. IMPRESSION: No acute finding.  No evidence of injury. Electronically Signed   By: Jorje Guild M.D.   On: 12/17/2021 12:19   ECHOCARDIOGRAM COMPLETE  Result Date: 12/15/2021    ECHOCARDIOGRAM REPORT   Patient Name:   Anna Garcia Date of Exam: 12/15/2021 Medical Rec #:  [id number]    Height:       64.0 in Accession #:    [id number]   Weight:       196.4 lb Date of Birth:  Sep 21, 1980     BSA:          1.941 m Patient Age:    41 years     BP:           106/78 mmHg Patient Gender: F  HR:           102 bpm. Exam Location:  Inpatient Procedure: 2D Echo, Cardiac Doppler and Color Doppler Indications:    CHF-Acute Diastolic H08.65  History:        Patient has prior history of Echocardiogram examinations, most                 recent 05/22/2021. Risk Factors:Diabetes and Hypertension. Severe                 volume overload/bilateral lower extremity edema. Liver cirrhosis                  with hepatic steatosis. Chronic macrocytic anemia.  Sonographer:    Darlina Sicilian RDCS Referring Phys: [id number] AMRIT ADHIKARI  Sonographer Comments: Suboptimal apical window due to limited patient mobility. IMPRESSIONS  1. Left ventricular ejection fraction, by estimation, is 60 to 65%. The left ventricle has normal function. The left ventricle has no regional wall motion abnormalities. Left ventricular diastolic parameters are indeterminate.  2. Right ventricular systolic function is normal. The right ventricular size is normal. Tricuspid regurgitation signal is inadequate for assessing PA pressure.  3. No evidence of mitral valve regurgitation.  4. The aortic valve was not well visualized. Aortic valve regurgitation is not visualized.  5. IVC is not well visualzied. Comparison(s): No significant change from prior study. FINDINGS  Left Ventricle: Left ventricular ejection fraction, by estimation, is 60 to 65%. The left ventricle has normal function. The left ventricle has no regional wall motion abnormalities. The left ventricular internal cavity size was normal in size. There is  no left ventricular hypertrophy. Left ventricular diastolic parameters are indeterminate. Right Ventricle: The right ventricular size is normal. Right ventricular systolic function is normal. Tricuspid regurgitation signal is inadequate for assessing PA pressure. Left Atrium: Left atrial size was normal in size. Right Atrium: Right atrial size was normal in size. Pericardium: There is no evidence of pericardial effusion. Mitral Valve: No evidence of mitral valve regurgitation. Tricuspid Valve: The tricuspid valve is normal in structure. Tricuspid valve regurgitation is not demonstrated. Aortic Valve: The aortic valve was not well visualized. Aortic valve regurgitation is not visualized. Pulmonic Valve: Pulmonic valve regurgitation is not visualized. Aorta: The aortic root and ascending aorta are structurally normal, with no  evidence of dilitation. Venous: IVC is not well visualzied.  LEFT VENTRICLE PLAX 2D LVIDd:         4.60 cm   Diastology LVIDs:         3.20 cm   LV e' medial:    7.18 cm/s LV PW:         0.80 cm   LV E/e' medial:  7.6 LV IVS:        0.85 cm   LV e' lateral:   7.29 cm/s LVOT diam:     1.80 cm   LV E/e' lateral: 7.5 LV SV:         33 LV SV Index:   17 LVOT Area:     2.54 cm  LEFT ATRIUM             Index        RIGHT ATRIUM          Index LA diam:        3.00 cm 1.55 cm/m   RA Area:     6.47 cm LA Vol (A2C):   31.3 ml 16.12 ml/m  RA Volume:   8.99 ml  4.63 ml/m  LA Vol (A4C):   33.6 ml 17.31 ml/m LA Biplane Vol: 33.2 ml 17.10 ml/m  AORTIC VALVE LVOT Vmax:   80.90 cm/s LVOT Vmean:  59.500 cm/s LVOT VTI:    0.130 m  AORTA Ao Root diam: 2.70 cm Ao Asc diam:  2.50 cm MITRAL VALVE MV Area (PHT): 3.46 cm    SHUNTS MV Decel Time: 219 msec    Systemic VTI:  0.13 m MV E velocity: 54.80 cm/s  Systemic Diam: 1.80 cm MV A velocity: 52.70 cm/s MV E/A ratio:  1.04 Mary Scientist, physiological signed by Phineas Inches Signature Date/Time: 12/15/2021/1:46:34 PM    Final    VAS [place] LOWER EXTREMITY VENOUS (DVT)  Result Date: 12/13/2021  Lower Venous DVT Study Patient Name:  Anna Garcia  Date of Exam:   12/13/2021 Medical Rec #: [id number]     Accession #:    [id number] Date of Birth: 06-17-1980      Patient Gender: F Patient Age:   57 years Exam Location:  [facility] Procedure:      VAS [place] LOWER EXTREMITY VENOUS (DVT) Referring Phys: AMRIT ADHIKARI --------------------------------------------------------------------------------  Indications: Edema.  Limitations: Body habitus, poor ultrasound/tissue interface and subcutaneous edema. Comparison Study: No previous exams Performing Technologist: Jody Hill RVT, RDMS  Examination Guidelines: A complete evaluation includes B-mode imaging, spectral Doppler, color Doppler, and power Doppler as needed of all accessible portions of each vessel. Bilateral testing is considered an  integral part of a complete examination. Limited examinations for reoccurring indications may be performed as noted. The reflux portion of the exam is performed with the patient in reverse Trendelenburg.  +--------+---------------+---------+-----------+----------+--------------------+ RIGHT   CompressibilityPhasicitySpontaneityPropertiesThrombus Aging       +--------+---------------+---------+-----------+----------+--------------------+ CFV     Full           Yes      Yes                                       +--------+---------------+---------+-----------+----------+--------------------+ SFJ     Full                                                              +--------+---------------+---------+-----------+----------+--------------------+ FV Prox Full           Yes      Yes                                       +--------+---------------+---------+-----------+----------+--------------------+ FV Mid  Full           Yes      Yes                                       +--------+---------------+---------+-----------+----------+--------------------+ FV      Full           Yes      Yes  Distal                                                                    +--------+---------------+---------+-----------+----------+--------------------+ PFV                    Yes      Yes                  patent by                                                                 color/doppler        +--------+---------------+---------+-----------+----------+--------------------+ POP     Full           Yes      Yes                                       +--------+---------------+---------+-----------+----------+--------------------+ PTV     Full                                         Not well visualized  +--------+---------------+---------+-----------+----------+--------------------+ PERO                                                  not visualized       +--------+---------------+---------+-----------+----------+--------------------+   Right Technical Findings: Not visualized segments include PeroV.  +--------+---------------+---------+-----------+----------+--------------------+ LEFT    CompressibilityPhasicitySpontaneityPropertiesThrombus Aging       +--------+---------------+---------+-----------+----------+--------------------+ CFV     Full           Yes      Yes                                       +--------+---------------+---------+-----------+----------+--------------------+ SFJ     Full                                                              +--------+---------------+---------+-----------+----------+--------------------+ FV Prox Full           Yes      Yes                                       +--------+---------------+---------+-----------+----------+--------------------+ FV Mid  Full           Yes      Yes                                       +--------+---------------+---------+-----------+----------+--------------------+  FV                                                   Not visualized       Distal                                                                    +--------+---------------+---------+-----------+----------+--------------------+ PFV                    Yes      Yes                  patent by                                                                 color/doppler        +--------+---------------+---------+-----------+----------+--------------------+ POP     Full           Yes      Yes                                       +--------+---------------+---------+-----------+----------+--------------------+ PTV                                                  patent by color      +--------+---------------+---------+-----------+----------+--------------------+ PERO    Full                                                               +--------+---------------+---------+-----------+----------+--------------------+   Left Technical Findings: Not visualized segments include distal FV, PTV.   Summary: BILATERAL: -No evidence of popliteal cyst, bilaterally. - Diffuse subcutaneous edema, bilaterally. RIGHT: - There is no evidence of deep vein thrombosis in the lower extremity. However, portions of this examination were limited- see technologist comments above.  LEFT: - There is no evidence of deep vein thrombosis in the lower extremity. However, portions of this examination were limited- see technologist comments above.  *See table(s) above for measurements and observations. Electronically signed by Monica Martinez MD on 12/13/2021 at 2:05:22 PM.    Final    US Abdomen Limited RUQ (LIVER/GB)  Result Date: 12/12/2021 CLINICAL DATA:  41 year old female with history of elevated liver enzymes. EXAM: ULTRASOUND ABDOMEN LIMITED RIGHT UPPER QUADRANT COMPARISON:  Abdominal ultrasound 11/18/2021. FINDINGS: Gallbladder: No gallstones or wall thickening visualized. No sonographic Murphy sign noted by sonographer. Common bile duct: Diameter: 6.5 mm Liver: No focal lesion identified. Hepatic parenchyma  is diffusely but heterogeneously echogenic, and liver has a nodular contour, indicative of underlying cirrhosis, likely with a background of fatty infiltration. Portal vein is patent on color Doppler imaging with normal direction of blood flow towards the liver. Other: None. IMPRESSION: 1. Borderline dilated common bile duct, similar to the prior examination. No cholelithiasis or definite choledocholithiasis. Additionally, there is no evidence of intrahepatic biliary ductal dilatation. If there is clinical concern for choledocholithiasis or other cause of biliary tract obstruction, further evaluation with abdominal MRI with and without IV gadolinium with MRCP could be considered. 2. Morphologic changes in the liver indicative of cirrhosis,  likely with a background of hepatic steatosis. Electronically Signed   By: Vinnie Langton M.D.   On: 12/12/2021 05:49   DG Abd Portable 1V  Result Date: 12/06/2021 CLINICAL DATA:  Abdominal pain. EXAM: PORTABLE ABDOMEN - 1 VIEW COMPARISON:  12/04/2021 FINDINGS: No evidence of bowel obstruction or significant ileus. No abnormal calcifications. Stable appearance of IUD. Visualized bony structures are unremarkable. IMPRESSION: No acute findings. Electronically Signed   By: Aletta Edouard M.D.   On: 12/06/2021 15:45   DG Abdomen Acute W/Chest  Result Date: 12/04/2021 CLINICAL DATA:  Nausea and vomiting with abdominal pain. EXAM: DG ABDOMEN ACUTE WITH 1 VIEW CHEST COMPARISON:  Chest x-ray dated November 18, 2021. FINDINGS: There is no evidence of dilated bowel loops or free intraperitoneal air. Questionable small bowel wall thickening in the left abdomen. No radiopaque calculi or other significant radiographic abnormality is seen. IUD in the pelvis. Heart size and mediastinal contours are within normal limits. Minimal linear atelectasis at the lung bases. No focal consolidation, pleural effusion, or pneumothorax. No acute osseous abnormality. IMPRESSION: 1. Questionable small bowel wall thickening in the left abdomen, which could reflect enteritis. No obstruction. 2. Minimal bibasilar atelectasis. No acute cardiopulmonary disease. Electronically Signed   By: Titus Dubin M.D.   On: 12/04/2021 13:49    Labs:  CBC: Recent Labs    12/22/21 0513 12/23/21 0448 12/24/21 0258 12/24/21 1700 12/25/21 0235  WBC 10.9* 6.6 5.1  --  7.3  HGB 9.2* 7.2* 6.8* 8.5* 9.3*  HCT 28.3* 21.8* 21.2* 26.5* 28.4*  PLT 222 157 147*  --  159    COAGS: Recent Labs    11/19/21 0910  INR 1.1    BMP: Recent Labs    12/22/21 0513 12/23/21 0448 12/24/21 0258 12/25/21 0235  NA 132* 140 145 147*  K 3.4* 3.0* 2.9* 3.6  CL 100 106 109 109  CO2 20* '23 25 25  '$ GLUCOSE 176* 112* 79 95  BUN '9 7 6 '$ <5*  CALCIUM 8.8*  9.1 9.6 9.8  CREATININE 1.29* 1.33* 1.33* 1.19*  GFRNONAA 54* 52* 52* 59*    LIVER FUNCTION TESTS: Recent Labs    12/20/21 1626 12/21/21 0400 12/22/21 0513 12/23/21 0448  BILITOT 1.6* 1.8* 1.7* 1.1  AST 39 37 35 31  ALT '30 28 25 18  '$ ALKPHOS 191* 189* 169* 129*  PROT 5.6* 5.7* 6.2* 5.8*  ALBUMIN 2.3* 2.3* 2.8* 3.3*    TUMOR MARKERS: No results for input(s): "AFPTM", "CEA", "CA199", "CHROMGRNA" in the last 8760 hours.  Assessment and Plan: 41 y.o. female with recently diagnosed cirrhosis, currently hospitalized due to decompensated cirrhosis with ascites/anasarca, SBP, encephalopathy, she is in need of random liver bx for further eval.   Case was reviewed by Dr. Kathlene Cote, approved for random liver biopsy, most likely via transjugular approach due to SBP and ascites.   CBC today -  WBC wnl, chronic macrocytic anemia hgb 9.3, plt 159  VS tachycardic but stable otherwise, O2 sat 91 % on 2L   Risks and benefits of random liver biopsy and paracentesis was discussed with the patient and/or patient's family including, but not limited to bleeding, infection, damage to adjacent structures or low yield requiring additional tests.  All of the questions were answered and there is agreement to proceed. Consent obtained from sister in law Gabriana Wilmott via telephone.   Consent signed and in IR.   The procedure is tentatively scheduled for tomorrow pending US/IR schedule.   PLAN - NPO at [facility] - Lovenox held  - INR in AM   Thank you for this interesting consult.  I greatly enjoyed meeting Anna Garcia and look forward to participating in their care.  A copy of this report was sent to the requesting provider on this date.  Electronically Signed: Tera Mater, PA-C 12/25/2021, 4:09 PM   I spent a total of 30 min    in face to face in clinical consultation, greater than 50% of which was counseling/coordinating care for random liver bx.  This chart was dictated using voice recognition  software.  Despite best efforts to proofread,  errors can occur which can change the documentation meaning.

## 2021-12-25 NOTE — Consult Note (Signed)
Neurology Consultation    Reason for Consult: confusion  CC: altered mental status  HISTORY OF PRESENT ILLNESS   History is obtained from: patient, chart review  HPI Anna Garcia is a 41 y.o. female with medical history significant for type 1 diabetes with gastroparesis, asthma, inadequate esophageal motility with GERD, chronic urinary retention who self caths, chronic macrocytic anemia, chronic pain syndrome/fibromyalgia, depression/anxiety, recent imaging compatible with liver Garcia who presented to the ED on 12/20/2021 for evaluation of lower extremity edema and now being treated for generalized weakness in the setting of anasarca and cellulitis. She is being evaluated today for progressive confusion during this admission. H/o confusion reported during previous admission 08/22-29/2023 (hallucinations, but remained oriented, psychiatry evaluation with concern for a mild alcohol use disorder). She underwent paracentesis which showed elevated neutrophil, chest xray and gram stain were negative. Blood culture came back positive for streptococcus anginosis (felt to be contaminated by ID). Repeat blood cx obtained on 9/2 showed no growth x 3 days.  Viral hepatitis serologies and autoimmune markers for liver disease were negative, she is also being evaluated for possible Wilson's disease as she has low ceruloplasmin, 24-hour urine copper collection pending. Random liver bx was recommended to further evaluate the etiology of recently diagnosed Anna Garcia. IR has been consulted for a liver biopsy that is tentatively planned for tomorrow.   Notable recent medication changes include receiving Benadryl 25 mg IV at noon on 9/4 Receiving Seroquel 25 mg at 3 PM on 9/5 Receiving Ativan 1 mg at 1230, 1 mg at 1634, and 4 mg at Jul 20, 2013 on 9/3, 1 mg at 1041, 1633, 07-21-2215 on 9/4 and 2 mg at 130, 326, and 5 AM on 9/5  Her sister-in-law Anna Garcia is able to contribute some additional limited history.  She  notes that Anna Garcia had been living with her mom until early 21-Jul-2018 when her mother passed away from Lewy body dementia.  Her mom had actually been a caregiver for Anna Garcia rather than the other way around, and Anna Garcia had been on disability since her early 26s due to her gastroparesis.  However Patriece seem to actually be doing better for short time after receiving inheritance money, gaining weight attributed to potentially eating fast food frequently with this money.  The patient's sister-in-law notes that she was unaware of the patient's drinking until her last hospitalization.  Family history is largely unknown as the family is not very close but the patient does have an uncle who potentially had diabetes and Garcia as well and was also a heavy drinker.  Anna Garcia notes that she typically had contact with [name] about once a month, but she never noted [name] to seem intoxicated or confused in any of these interactions in the past, but she conversed normally when they would talk.  Anna Garcia notes that the patient was drinking soda and eating candy immediately after discharge and was unconcerned about her sugar being over 300 stating she would just take insulin for it.  However she does not think that the patient was drinking the night she stayed with them before being readmitted to the hospital  [id number] to obtain due to altered mental status.   PAST MEDICAL HISTORY    Past Medical History:  Past Medical History:  Diagnosis Date   Diabetes mellitus    Gastroparesis    Hypertension    Neuropathy     No family history on file. Family History  Problem Relation Age of Onset   Hypertension Mother  Dementia Mother    Heart attack Father    Hypertension Father    Heart disease Father    Hypertension Brother    Stroke Maternal Grandfather     Allergies:  No Known Allergies  Social History:   reports that she has quit smoking. Her smoking use included cigarettes. She smoked an average of .5 packs per  day. She has never used smokeless tobacco. She reports that she does not drink alcohol and does not use drugs.    Medications Medications Prior to Admission  Medication Sig Dispense Refill   DEXILANT 60 MG capsule Take 1 capsule (60 mg total) by mouth daily. 30 capsule 0   dicyclomine (BENTYL) 10 MG capsule Take 1 capsule (10 mg total) by mouth 4 (four) times daily -  before meals and at bedtime. 120 capsule 1   famotidine (PEPCID) 40 MG tablet Take 40 mg by mouth 2 (two) times daily.     ferrous sulfate 325 (65 FE) MG tablet Take 1 tablet by mouth daily with breakfast. 30 tablet 1   gabapentin (NEURONTIN) 300 MG capsule Take 1 capsule (300 mg total) by mouth 3 (three) times daily. 90 capsule 1   granisetron (KYTRIL) 1 MG tablet Take 1 mg by mouth every 12 (twelve) hours.     insulin glargine (LANTUS SOLOSTAR) 100 UNIT/ML Solostar Pen Inject 8 Units into the skin 2 times daily. 15 mL 0   insulin glulisine (APIDRA SOLOSTAR) 100 UNIT/ML Solostar Pen Inject 0 - 8 Units into the skin daily AS NEEDED if blood glucose levels are trending upwards- changes as per carb intake (Patient taking differently: Inject 0-8 Units into the skin daily as needed (for glucose levels are trending upward- changes carbs intake).) 15 mL 1   lipase/protease/amylase (CREON) 36000 UNITS CPEP capsule Take 1 capsule (36,000 Units total) by mouth 3 (three) times daily with meals. 270 capsule 1   loperamide (IMODIUM) 2 MG capsule Take 1 capsule (2 mg total) by mouth as needed for diarrhea or loose stools. (Patient taking differently: Take 2 mg by mouth daily as needed for diarrhea or loose stools.) 30 capsule 0   metoCLOPramide (REGLAN) 10 MG tablet Take 1 tablet by mouth every 6 hours as needed for nausea. 20 tablet 0   mometasone-formoterol (DULERA) 100-5 MCG/ACT AERO Inhale 2 puffs into the lungs 2 (two) times daily. 1 each 1   montelukast (SINGULAIR) 10 MG tablet Take 10 mg by mouth at bedtime.     MOTEGRITY 1 MG TABS Take 1  mg by mouth daily.     rosuvastatin (CRESTOR) 20 MG tablet Take 20 mg by mouth at bedtime.     BAQSIMI TWO PACK 3 MG/DOSE POWD Place 1 spray into both nostrils as needed (for a very low blood sugar emergency, when unable to eat or drink and need help from someone else- or as otherwise directed).     diphenhydrAMINE (BENADRYL ALLERGY) 25 mg capsule Take 1 capsule (25 mg total) by mouth every 6 (six) hours as needed. (Patient not taking: Reported on 12/21/2021) 30 capsule 0   hydrocortisone cream 0.5 % Apply 1 Application topically 2 (two) times daily. Apply on rash on hands (Patient not taking: Reported on 12/21/2021) 30 g 0    Current Facility-Administered Medications:    0.9 %  sodium chloride infusion (Manually program via Guardrails IV Fluids), , Intravenous, Once, Ghimire, Henreitta Leber, MD   acetaminophen (TYLENOL) tablet 650 mg, 650 mg, Oral, Q6H PRN, Ghimire, Henreitta Leber, MD,  650 mg at 12/23/21 1191   albumin human 25 % solution 25 g, 25 g, Intravenous, TID, Ghimire, Henreitta Leber, MD, Last Rate: 60 mL/hr at 12/25/21 1734, 25 g at 12/25/21 1734   albuterol (PROVENTIL) (2.5 MG/3ML) 0.083% nebulizer solution 2.5 mg, 2.5 mg, Nebulization, Q4H PRN, Ghimire, Henreitta Leber, MD   cefTRIAXone (ROCEPHIN) 2 g in sodium chloride 0.9 % 100 mL IVPB, 2 g, Intravenous, Q24H, Dixon, Stephanie N, NP, Last Rate: 200 mL/hr at 12/25/21 1104, 2 g at 12/25/21 1104   dextrose 5 %-0.9 % sodium chloride infusion, , Intravenous, Continuous, Ghimire, Henreitta Leber, MD, Last Rate: 25 mL/hr at 12/25/21 1923, New Bag at 12/25/21 1923   dicyclomine (BENTYL) capsule 10 mg, 10 mg, Oral, TID AC & HS, Patel, Vishal R, MD, 10 mg at 12/25/21 1508   enoxaparin (LOVENOX) injection 40 mg, 40 mg, Subcutaneous, Q24H, Patel, Vishal R, MD, 40 mg at 12/25/21 1104   famotidine (PEPCID) tablet 40 mg, 40 mg, Oral, BID, Zada Finders R, MD, 40 mg at 47/82/95 6213   folic acid (FOLVITE) tablet 2 mg, 2 mg, Oral, Daily, Ghimire, Shanker M, MD, 2 mg at 12/24/21  1010   insulin aspart (novoLOG) injection 0-6 Units, 0-6 Units, Subcutaneous, TID WC, Ghimire, Henreitta Leber, MD, 3 Units at 12/25/21 1914   insulin glargine-yfgn (SEMGLEE) injection 6 Units, 6 Units, Subcutaneous, BID, Ghimire, Shanker M, MD   lactulose (CHRONULAC) 10 GM/15ML solution 10 g, 10 g, Oral, BID, Ghimire, Henreitta Leber, MD, 10 g at 12/25/21 1508   lipase/protease/amylase (CREON) capsule 36,000 Units, 36,000 Units, Oral, TID WC, Ghimire, Henreitta Leber, MD, 36,000 Units at 12/23/21 1634   loperamide (IMODIUM) capsule 2 mg, 2 mg, Oral, Daily PRN, Ghimire, Henreitta Leber, MD   metoCLOPramide (REGLAN) tablet 10 mg, 10 mg, Oral, TID AC, Patel, Vishal R, MD, 10 mg at 12/24/21 1158   metroNIDAZOLE (FLAGYL) IVPB 500 mg, 500 mg, Intravenous, Q12H, Dixon, Stephanie N, NP   midodrine (PROAMATINE) tablet 10 mg, 10 mg, Oral, TID WC, Patel, Vishal R, MD, 10 mg at 12/25/21 1507   mometasone-formoterol (DULERA) 100-5 MCG/ACT inhaler 2 puff, 2 puff, Inhalation, BID, Zada Finders R, MD, 2 puff at 12/24/21 0837   montelukast (SINGULAIR) tablet 10 mg, 10 mg, Oral, QHS, Patel, Vishal R, MD, 10 mg at 12/24/21 2127   multivitamin with minerals tablet 1 tablet, 1 tablet, Oral, Daily, Ghimire, Henreitta Leber, MD, 1 tablet at 12/23/21 1219   naloxone (NARCAN) injection 0.4 mg, 0.4 mg, Intravenous, PRN, Shela Leff, MD   Oral care mouth rinse, 15 mL, Mouth Rinse, PRN, Ghimire, Henreitta Leber, MD   QUEtiapine (SEROQUEL) tablet 25 mg, 25 mg, Oral, QHS, Ghimire, Shanker M, MD, 25 mg at 12/25/21 1508   rosuvastatin (CRESTOR) tablet 20 mg, 20 mg, Oral, QHS, Ghimire, Shanker M, MD, 20 mg at 12/24/21 2121   sodium chloride flush (NS) 0.9 % injection 3 mL, 3 mL, Intravenous, Q12H, Patel, Vishal R, MD, 3 mL at 12/25/21 1105   spironolactone (ALDACTONE) tablet 100 mg, 100 mg, Oral, Daily, Ghimire, Shanker M, MD, 100 mg at 12/25/21 1507   [DISCONTINUED] thiamine (VITAMIN B1) tablet 100 mg, 100 mg, Oral, Daily, 100 mg at 12/23/21 1219 **OR**  thiamine (VITAMIN B1) injection 500 mg, 500 mg, Intravenous, Daily, Ghimire, Shanker M, MD, 500 mg at 12/25/21 1521   triamcinolone (KENALOG) 0.025 % cream, , Topical, BID, Vu, Rockey Situ, MD, Given at 12/24/21 2210  EXAMINATION    Current vital signs:    12/25/2021  3:06 PM 12/25/2021    2:00 PM 12/25/2021    8:34 AM  Vitals with BMI  Systolic 97  244  Diastolic 48  68  Pulse 010 79 107    Examination:  GENERAL: Awake, alert, agitated HEENT: - Normocephalic and atraumatic, dry mm, no lymphadenopathy, no Thyromegally.  No clear evidence of Kaiser-Fleischer rings LUNGS - Clear to auscultation bilaterally CV - S1S2 RRR, equal pulses bilaterally. ABDOMEN - Soft, nontender, nondistended with normoactive BS Ext: warm, well perfused, intact peripheral pulses, no pedal edema  NEURO:  Mental Status: AA&Ox2, able to provide name and location. States it is Sept 1923. Agitated and inconsistently following commands Language: speech is clear.  Cranial Nerves:  II: PERRL. Visual fields full to confrontation.  III, IV, VI: EOM intact. Blink to threat, tracks examiner V: Sensation intact V1-3 symmetrically  VII: no facial asymmetry   VIII: hearing intact to voice IX, X: Phonation is normal XI: Head is midline XII: tongue is midline Motor:  RUE: 5/5     LUE: 5/5 RLE: 5/5     LLE: 5/5   Tone: is normal and bulk is normal Sensation- grossly intact bilaterally Coordination: non cooperative with commands Gait- deferred, restraints in place    LABS   I have reviewed labs in epic and the results pertinent to this consultation are:   No results found for: "LDLCALC" Lab Results  Component Value Date   ALT 18 12/23/2021   AST 31 12/23/2021   GGT 1,054 (H) 11/19/2021   ALKPHOS 129 (H) 12/23/2021   BILITOT 1.1 12/23/2021   Lab Results  Component Value Date   HGBA1C 7.6 (H) 11/18/2021   Lab Results  Component Value Date   WBC 7.3 12/25/2021   HGB 9.3 (L) 12/25/2021   HCT 28.4 (L)  12/25/2021   MCV 101.8 (H) 12/25/2021   PLT 159 12/25/2021   Lab Results  Component Value Date   VITAMINB12 1,027 (H) 12/23/2021   Lab Results  Component Value Date   FOLATE 5.3 (L) 12/23/2021   Lab Results  Component Value Date   NA 147 (H) 12/25/2021   K 3.6 12/25/2021   CL 109 12/25/2021   CO2 25 12/25/2021   Ammonia 35  DIAGNOSTIC IMAGING/PROCEDURES   I have reviewed the images obtained: as below    CT Ab/Pelvis 1. No definite CT evidence for acute pulmonary embolus. Subsegmental atelectasis at the bases. 2. Liver Garcia with distorted appearance of the central parenchyma suggestive of fibrosis. When the patient is clinically stable and able to follow directions and hold their breath (preferably as an outpatient) further evaluation with dedicated abdominal MRI should be considered. 3. Prominent small bowel thickening and diffuse colon wall thickening. Differential considerations include enterocolitis versus nonspecific bowel edema secondary to edematous state or hypoproteinemia. 4. Moderate volume of abdominopelvic ascites. Generalized subcutaneous edema consistent with anasarca.  MRI brain 1. Technically limited study due to the patient's inability to tolerate the exam. Axial and coronal DWI sequences only were performed. 2. No acute or subacute intracranial infarct. No other definite intracranial abnormality on this limited exam.  EEG 12/24/2021 This study is suggestive of moderate diffuse encephalopathy, nonspecific etiology. No seizures or epileptiform discharges were seen throughout the recording.  ASSESSMENT/PLAN    Assessment:  42 y.o. female with medical history significant for type 1 diabetes with gastroparesis, asthma, inadequate esophageal motility with GERD, chronic urinary retention who self caths, chronic macrocytic anemia, chronic pain syndrome/fibromyalgia, depression/anxiety, recent imaging compatible with liver Garcia who  presented to the ED  on 12/20/2021 for evaluation of lower extremity edema and now being treated for generalized weakness in the setting of anasarca and cellulitis.   I doubt that at this time she is experiencing significant alcohol withdrawal given she was hospitalized for over a week and discharged only 1 day.  Please note that benzodiazepines are deliriogenic and therefore I agree with discontinuing CIWA protocol. Impression: Multifactorial delirium/encephalopathy in the setting of hospitalization, chronic pain, deliriogenic medications, decompensated Garcia, aspiration, restraints Concern for possible underlying Wilson's disease   Recommendations: - Agree with liver biopsy for further assessment of possible Wilson's disease - Repeat MRI with and w/o contrast, if she will be anesthetized for liver biopsy please arrange timing MRI with this sedation - Given her last QTc was prolonged (greater than 500), repeat EKG to confirm safety of Seroquel (discussed with nursing, patient had refused earlier but they will reattempt) - Agree with discontinuing benzodiazepines on CIWA protocol - Consider resumption of low-dose gabapentin (300 nightly instead of 300 3 times daily) to help manage her chronic pain/fibromyalgia given this is primarily renally cleared - Neurology will follow  -- Patient seen and examined by NP/APP with MD. MD to update note as needed.   Janine Ores, DNP, FNP-BC [facility] Pager: [phone]   **This documentation was dictated using [place] and may contain inadvertent errors ** Attending Neurologist's note:  I personally saw this patient, gathering history, performing a full neurologic examination, reviewing relevant labs, personally reviewing relevant imaging including labs very limited MRI brain, and formulated the assessment and plan, adding the note above for completeness and clarity to accurately reflect my thoughts  Additional history obtained from  family added to note above.  On my examination she follows some commands intermittently, mostly just cries out but occasionally answers some questions such as stating her name, repeatedly denies any pain although she winces when she is touched anywhere (asks "why do you keep asking me that" in response to repeated questioning about pain), not oriented to place.  Uses all 4 extremities equally and assisting with bed mobility while nursing is repositioning her, and equally reactive to touch in all 4 extremities.   Plan as above, edited to reflect my thoughts  Lesleigh Noe MD-PhD [facility] [phone]  Available 7 AM to 7 PM, outside these hours please contact Neurologist on call listed on AMION

## 2021-12-26 ENCOUNTER — Inpatient Hospital Stay (HOSPITAL_COMMUNITY): Payer: Medicare Other

## 2021-12-26 DIAGNOSIS — R601 Generalized edema: Secondary | ICD-10-CM | POA: Diagnosis not present

## 2021-12-26 DIAGNOSIS — G9340 Encephalopathy, unspecified: Secondary | ICD-10-CM | POA: Diagnosis not present

## 2021-12-26 DIAGNOSIS — A419 Sepsis, unspecified organism: Secondary | ICD-10-CM | POA: Diagnosis not present

## 2021-12-26 DIAGNOSIS — K7460 Unspecified cirrhosis of liver: Secondary | ICD-10-CM | POA: Diagnosis not present

## 2021-12-26 HISTORY — PX: IR US GUIDE BX ASP/DRAIN: IMG2392

## 2021-12-26 LAB — COPPER, URINE - RANDOM OR 24 HOUR
Copper / Creatinine Ratio: 50 ug/g creat — ABNORMAL HIGH (ref 0–49)
Copper, 24H Ur: 21 ug/24 hr (ref 3–35)
Copper, Ur: 7 ug/L
Creatinine(Crt),U: 0.14 g/L — ABNORMAL LOW (ref 0.30–3.00)
Total Volume: 3000

## 2021-12-26 LAB — URINE CULTURE: Culture: NO GROWTH

## 2021-12-26 LAB — CBC
HCT: 25.7 % — ABNORMAL LOW (ref 36.0–46.0)
Hemoglobin: 8.2 g/dL — ABNORMAL LOW (ref 12.0–15.0)
MCH: 33.1 pg (ref 26.0–34.0)
MCHC: 31.9 g/dL (ref 30.0–36.0)
MCV: 103.6 fL — ABNORMAL HIGH (ref 80.0–100.0)
Platelets: 126 10*3/uL — ABNORMAL LOW (ref 150–400)
RBC: 2.48 MIL/uL — ABNORMAL LOW (ref 3.87–5.11)
RDW: 23 % — ABNORMAL HIGH (ref 11.5–15.5)
WBC: 8.5 10*3/uL (ref 4.0–10.5)
nRBC: 0 % (ref 0.0–0.2)

## 2021-12-26 LAB — AMMONIA: Ammonia: 42 umol/L — ABNORMAL HIGH (ref 9–35)

## 2021-12-26 LAB — COMPREHENSIVE METABOLIC PANEL
ALT: 16 U/L (ref 0–44)
AST: 40 U/L (ref 15–41)
Albumin: 4 g/dL (ref 3.5–5.0)
Alkaline Phosphatase: 89 U/L (ref 38–126)
Anion gap: 12 (ref 5–15)
BUN: <5 mg/dL — ABNORMAL LOW (ref 6–20)
CO2: 23 mmol/L (ref 22–32)
Calcium: 9.4 mg/dL (ref 8.9–10.3)
Chloride: 109 mmol/L (ref 98–111)
Creatinine, Ser: 1.22 mg/dL — ABNORMAL HIGH (ref 0.44–1.00)
GFR, Estimated: 58 mL/min — ABNORMAL LOW (ref >60)
Glucose, Bld: 176 mg/dL — ABNORMAL HIGH (ref 70–99)
Potassium: 3.2 mmol/L — ABNORMAL LOW (ref 3.5–5.1)
Sodium: 144 mmol/L (ref 135–145)
Total Bilirubin: 2.1 mg/dL — ABNORMAL HIGH (ref 0.3–1.2)
Total Protein: 5.8 g/dL — ABNORMAL LOW (ref 6.5–8.1)

## 2021-12-26 LAB — GLUCOSE, CAPILLARY
Glucose-Capillary: 189 mg/dL — ABNORMAL HIGH (ref 70–99)
Glucose-Capillary: 108 mg/dL — ABNORMAL HIGH (ref 70–99)
Glucose-Capillary: 183 mg/dL — ABNORMAL HIGH (ref 70–99)
Glucose-Capillary: 407 mg/dL — ABNORMAL HIGH (ref 70–99)

## 2021-12-26 LAB — CULTURE, BLOOD (ROUTINE X 2): Culture: NO GROWTH

## 2021-12-26 LAB — CULTURE, BODY FLUID W GRAM STAIN -BOTTLE: Culture: NO GROWTH

## 2021-12-26 LAB — PROCALCITONIN: Procalcitonin: 0.46 ng/mL

## 2021-12-26 LAB — PROTIME-INR
INR: 1.5 — ABNORMAL HIGH (ref 0.8–1.2)
Prothrombin Time: 17.9 seconds — ABNORMAL HIGH (ref 11.4–15.2)

## 2021-12-26 LAB — PATHOLOGIST SMEAR REVIEW: Path Review: NEGATIVE

## 2021-12-26 MED ORDER — LIDOCAINE HCL 1 % IJ SOLN
INTRAMUSCULAR | Status: AC
  Filled 2021-12-26: qty 20

## 2021-12-26 MED ORDER — GABAPENTIN 300 MG PO CAPS
300.0000 mg | ORAL_CAPSULE | Freq: Two times a day (BID) | ORAL | Status: DC
  Administered 2021-12-26 – 2022-01-04 (×18): 300 mg via ORAL
  Filled 2021-12-26 (×18): qty 1

## 2021-12-26 MED ORDER — SULFAMETHOXAZOLE-TRIMETHOPRIM 800-160 MG PO TABS
1.0000 | ORAL_TABLET | Freq: Every day | ORAL | Status: DC
  Filled 2021-12-26: qty 1

## 2021-12-26 MED ORDER — CHLORHEXIDINE GLUCONATE CLOTH 2 % EX PADS
6.0000 | MEDICATED_PAD | Freq: Every day | CUTANEOUS | Status: DC
Start: 2021-12-26 — End: 2021-12-31
  Administered 2021-12-26 – 2021-12-29 (×4): 6 via TOPICAL

## 2021-12-26 MED ORDER — MIDAZOLAM HCL 2 MG/2ML IJ SOLN
INTRAMUSCULAR | Status: AC | PRN
  Administered 2021-12-26: .5 mg via INTRAVENOUS

## 2021-12-26 MED ORDER — POTASSIUM CHLORIDE 10 MEQ/100ML IV SOLN
10.0000 meq | INTRAVENOUS | Status: AC
  Administered 2021-12-26 (×3): 10 meq via INTRAVENOUS
  Filled 2021-12-26 (×3): qty 100

## 2021-12-26 MED ORDER — GELATIN ABSORBABLE 12-7 MM EX MISC
CUTANEOUS | Status: AC
  Filled 2021-12-26: qty 1

## 2021-12-26 MED ORDER — QUETIAPINE FUMARATE 50 MG PO TABS
50.0000 mg | ORAL_TABLET | Freq: Every day | ORAL | Status: DC
  Administered 2021-12-26 – 2022-01-03 (×8): 50 mg via ORAL
  Filled 2021-12-26 (×8): qty 1

## 2021-12-26 MED ORDER — MIDAZOLAM HCL 2 MG/2ML IJ SOLN
INTRAMUSCULAR | Status: AC
  Filled 2021-12-26: qty 2

## 2021-12-26 MED ORDER — HALOPERIDOL LACTATE 5 MG/ML IJ SOLN: 3.0000 mg | Freq: Once | INTRAMUSCULAR | Status: AC

## 2021-12-26 MED ORDER — HALOPERIDOL LACTATE 5 MG/ML IJ SOLN
INTRAMUSCULAR | Status: AC
  Filled 2021-12-26: qty 1

## 2021-12-26 MED ORDER — INFLUENZA VAC SPLIT QUAD 0.5 ML IM SUSY
0.5000 mL | PREFILLED_SYRINGE | INTRAMUSCULAR | Status: AC | PRN
  Administered 2022-01-04: 0.5 mL via INTRAMUSCULAR
  Filled 2021-12-26: qty 0.5

## 2021-12-26 MED ORDER — FUROSEMIDE 40 MG PO TABS
40.0000 mg | ORAL_TABLET | Freq: Every day | ORAL | Status: DC
  Administered 2021-12-26 – 2021-12-30 (×4): 40 mg via ORAL
  Filled 2021-12-26 (×5): qty 1

## 2021-12-26 NOTE — Progress Notes (Signed)
  X-cover Note: Received page by bedside RN requesting assistance with this agitated patient. Chart reviewed. Neurology recommended stopping benzos. Pt won't lie still for long enough to repeat EKG. Pt already in waist belt and bilateral mitten to keep her from pulling out IVF and prevent her from getting out of bed.  Ordered 1 dose of IV haldol 3 mg.  Informed bedside RN to call floor coverage provider for further orders if needed as I am the admitting provider tonight.  Kristopher Oppenheim, DO [facility]

## 2021-12-26 NOTE — Procedures (Signed)
Interventional Radiology Procedure Note   Pre-op: 41 yo patient with AMS, unable to participate with instructions.  Transjugular bx deemed high risk.  Able to visualize left liver with [place], and no ascites present.  Will proceed with percutaneous bx.  Procedure:  US guided medical liver biopsy, Mx 18g core .  Complications: None  Recommendations:  - Ok to shower tomorrow - Do not submerge for 7 days - Routine wound care - follow up pathology   Signed,  Dulcy Fanny. Earleen Newport, DO

## 2021-12-26 NOTE — Plan of Care (Signed)
Late entry.  Discussed with Dr. Sloan Leiter earlier today, overall suspect multifactorial delirium.  Given negative MRI and EEG do not feel there is any acute intracranial process or primary neurological etiology.  Certainly her liver disease is playing a large role, and Wilson's disease is a possibility but low ceruloplasmin can be seen in some other conditions as well.  Agree that liver biopsy should be helpful in clarifying the diagnosis  No additional neurological work-up to recommend at this time, but if additional questions or concerns arise please do not hesitate to reach out to neurology.  We will be available as needed

## 2021-12-26 NOTE — Progress Notes (Addendum)
PROGRESS NOTE        PATIENT DETAILS Name: Anna Garcia Age: 41 y.o. Sex: female Date of Birth: 01/31/1981 Admit Date: 12/20/2021 Admitting Physician Evalee Mutton Kristeen Mans, MD PCP:Day, [name], MD  Brief Summary: Patient is a 41 y.o.  female with history of DM-1, gastroparesis, chronic pain syndrome/fibromyalgia, HTN, chronic urinary retention-self caths at home-who was just discharged from this facility on 8/29 after being treated for DKA/AKI/volume overload-presented back to the hospital with anasarca-3+ pitting edema in the lower extremity up to the thighs.  She was found to have SBP in Streptococcus bacteremia.  Further hospital course was complicated by development of encephalopathy and acute hypoxic respiratory failure likely due to aspiration pneumonia.  See below for further details.  Significant events: 8/22-8/29>> hospitalization for DKA/AKI-found to have liver cirrhosis 8/31>> admit for anasarca/significant lower extremity edema. 9/01>> IV albumin started-midodrine dosage escalated. 9/03>> hallucinating-confused. 9/05>> hypoxic after Ativan given for MRI brain-started on Ventimask.  Transfer to progressive care-subsequently titrated down to 3-4 L.  Significant studies: 2/23>>Hbs Ag, HCV HE:NIDPOEUM 8/24>> bilateral lower extremity Doppler: No DVT. 8/28>>ANA/RA/Anti mitrochondrial Ab/Anti smooth muscle PN:TIRWERXV 8/26>> Echo: EF-60-65%. 8/31>> CXR: No PNA 8/31>> CT chest/abdomen/pelvis: No PE, liver cirrhosis, prominent small bowel wall thickening, moderate volume ascites.  Generalized subcutaneous edema. 9/01>> UA: No proteinuria. 9/3>> MRI brain: Limited images-but no acute CVA. 9/3>> EEG: No seizures.   Significant microbiology data: 9/01>>1/2 blood culture: Streptococcus anginosus 9/01>>ascites fluid culture: No growth 9/02>> blood culture: No growth.  Procedures: 9/01>> diagnostic paracentesis-1 L (WBC 590 w 85% neutrophils) 9/06>> liver  biopsy by IR.  Consults: GI, ID  Subjective: More awake compared to yesterday-crying-remains confused.  Objective: Vitals: Blood pressure 100/64, pulse (!) 107, temperature 98 F (36.7 C), temperature source Axillary, resp. rate (!) 24, height '5\' 4"'$  (1.626 m), weight 82 kg, SpO2 93 %.   Exam: Gen Exam: Not in any distress.  Still confused. HEENT:atraumatic, normocephalic Chest: B/L clear to auscultation anteriorly CVS:S1S2 regular Abdomen:soft non tender, non distended Extremities:++edema-some minimal erythema persists. Neurology: Non focal Skin: no rash   Pertinent Labs/Radiology:    Latest Ref Rng & Units 12/26/2021    6:36 AM 12/25/2021    2:35 AM 12/24/2021    5:00 PM  CBC  WBC 4.0 - 10.5 K/uL 8.5  7.3    Hemoglobin 12.0 - 15.0 g/dL 8.2  9.3  8.5   Hematocrit 36.0 - 46.0 % 25.7  28.4  26.5   Platelets 150 - 400 K/uL 126  159      Lab Results  Component Value Date   NA 144 12/26/2021   K 3.2 (L) 12/26/2021   CL 109 12/26/2021   CO2 23 12/26/2021      Assessment/Plan: Anasarca: Continues to have significant improvement in volume status, -13 L so far.  Continue Lasix/Aldactone.    Acute hypoxic respiratory failure likely due to aspiration pneumonia: On minimal amount of oxygen this morning-continue Rocephin/Flagyl.  Avoid benzodiazepines.  Acute metabolic encephalopathy: Thought to be delirium-discussed with neurology-Dr. Earley Favor further work-up recommended-recommendations are to continue with supportive care.  Avoid benzodiazepines-given that QTc is stable today-increase Seroquel to 50 mg.  Streptococcus bacteremia/spine drainage bacterial peritonitis: Remains on IV Rocephin (stop date 9/8)-ID recommending that we transition to Bactrim prophylaxis once daily on 9/9.   Cellulitis right foot: Improved-continue IV antibiotics.  AKI: Significant improvement in creatinine-although  volume status has improved-still somewhat volume overloaded-continue diuretics.       Hypokalemia: Continue to replete and recheck.  Liver cirrhosis: Diagnosed recently on imaging studies-hepatitis serology/autoimmune work-up negative.  Recent EGD June 2023 did not show any obvious varices.  Given low ceruloplasmin levels-24 urine copper have been sent out-pending at this point.  Appreciate GI assistance.  Spoke with ophthalmologist-Dr. Patel-9/5-almost impossible to see KF rings without a slit-lamp exam-hence ophthalmology referral will need to be done in the outpatient setting.  Spoke with sister-in-law-[name]-no family history of liver issues that she is aware of.  Underwent liver biopsy on 9/6.  Macrocytic anemia: S/p 1 unit of PRBC on 9/4-no evidence of hemolysis (LDH normal)-no evidence of blood loss.  Suspect this could be related to acute illness.  Continue to follow CBC closely.  Recent folate/B12 levels in June stable.  History of esophageal stricture: S/p dilatation June 2023.  Denies any vomiting or dysphagia symptoms.  History of diabetic gastroparesis: No vomiting-appears stable-continue Reglan.  Encourage small portion meals.  DM-1 (A1c 7.6 on 7/30): No further hypoglycemic episodes-continue low-dose Semglee/SSI and follow.   Recent Labs    12/25/21 1802 12/26/21 0752 12/26/21 1140  GLUCAP 252* 189* 108*     HLD: Continue statin  Moderate persistent asthma: Not in flare-continue bronchodilators.  History of chronic urinary retention-s/p self-catheterization at home: Due to worsening encephalopathy-significant urinary retention-indwelling Foley catheter placed on 9/4.  Once mental status improves-we will remove Foley catheter and resume intermittent in/out catheterization.    Chronic pain/fibromyalgia/neuropathy: We will go and resume Neurontin today.  Obesity Estimated body mass index is 31.03 kg/m as calculated from the following:   Height as of this encounter: '5\' 4"'$  (1.626 m).   Weight as of this encounter: 82 kg.   Code status:   Code Status:  Full Code   DVT Prophylaxis: enoxaparin (LOVENOX) injection 40 mg Start: 12/21/21 1000   Family Communication: Sister-in-law-[name]-[phone]-updated over the phone on 9/5.   Disposition Plan: Status is: Observation The patient will require care spanning > 2 midnights and should be moved to inpatient because: Severe anasarca having difficulty ambulating-Will need inpatient optimization of her volume status before consideration of discharge.   Planned Discharge Destination:Home later this week.   Diet: Diet Order             Diet heart healthy/carb modified Room service appropriate? No; Fluid consistency: Thin  Diet effective now                     Antimicrobial agents: Anti-infectives (From admission, onward)    Start     Dose/Rate Route Frequency Ordered Stop   12/29/21 1000  sulfamethoxazole-trimethoprim (BACTRIM DS) 800-160 MG per tablet 1 tablet        1 tablet Oral Daily 12/26/21 1138     12/25/21 2200  metroNIDAZOLE (FLAGYL) tablet 500 mg  Status:  Discontinued        500 mg Oral Every 12 hours 12/25/21 1416 12/25/21 1421   12/25/21 2200  metroNIDAZOLE (FLAGYL) IVPB 500 mg        500 mg 100 mL/hr over 60 Minutes Intravenous Every 12 hours 12/25/21 1421 12/28/21 2359   12/25/21 1030  metroNIDAZOLE (FLAGYL) IVPB 500 mg  Status:  Discontinued        500 mg 100 mL/hr over 60 Minutes Intravenous Every 12 hours 12/25/21 0935 12/25/21 1416   12/22/21 0800  cefTRIAXone (ROCEPHIN) 2 g in sodium chloride 0.9 % 100 mL IVPB  2 g 200 mL/hr over 30 Minutes Intravenous Every 24 hours 12/22/21 0236 12/29/21 0759   12/21/21 2100  cefTRIAXone (ROCEPHIN) 1 g in sodium chloride 0.9 % 100 mL IVPB  Status:  Discontinued        1 g 200 mL/hr over 30 Minutes Intravenous Every 24 hours 12/21/21 0055 12/22/21 0236   12/21/21 0030  cefTRIAXone (ROCEPHIN) 1 g in sodium chloride 0.9 % 100 mL IVPB  Status:  Discontinued        1 g 200 mL/hr over 30 Minutes Intravenous  Once  12/21/21 0015 12/21/21 0055   12/20/21 2145  cefTRIAXone (ROCEPHIN) 1 g in sodium chloride 0.9 % 100 mL IVPB  Status:  Discontinued        1 g 200 mL/hr over 30 Minutes Intravenous  Once 12/20/21 2139 12/20/21 2214   12/20/21 0000  cephALEXin (KEFLEX) 500 MG capsule  Status:  Discontinued        500 mg Oral 4 times daily 12/20/21 2319 12/21/21         MEDICATIONS: Scheduled Meds:  sodium chloride   Intravenous Once   Chlorhexidine Gluconate Cloth  6 each Topical Q0600   dicyclomine  10 mg Oral TID AC & HS   enoxaparin (LOVENOX) injection  40 mg Subcutaneous Q24H   famotidine  40 mg Oral BID   folic acid  2 mg Oral Daily   insulin aspart  0-6 Units Subcutaneous TID WC   insulin glargine-yfgn  6 Units Subcutaneous BID   lactulose  10 g Oral BID   lipase/protease/amylase  36,000 Units Oral TID WC   metoCLOPramide  10 mg Oral TID AC   midodrine  10 mg Oral TID WC   mometasone-formoterol  2 puff Inhalation BID   montelukast  10 mg Oral QHS   multivitamin with minerals  1 tablet Oral Daily   QUEtiapine  50 mg Oral QHS   rosuvastatin  20 mg Oral QHS   sodium chloride flush  3 mL Intravenous Q12H   spironolactone  100 mg Oral Daily   [START ON 12/29/2021] sulfamethoxazole-trimethoprim  1 tablet Oral Daily   triamcinolone   Topical BID   Continuous Infusions:  cefTRIAXone (ROCEPHIN)  IV 2 g (12/26/21 0849)   dextrose 5 % and 0.9% NaCl 25 mL/hr at 12/26/21 0145   metronidazole 500 mg (12/26/21 1140)   potassium chloride 10 mEq (12/26/21 1227)   PRN Meds:.acetaminophen, albuterol, influenza vac split quadrivalent PF, loperamide, naLOXone (NARCAN)  injection, mouth rinse   I have personally reviewed following labs and imaging studies  LABORATORY DATA: CBC: Recent Labs  Lab 12/20/21 1626 12/20/21 1709 12/22/21 0513 12/23/21 0448 12/24/21 0258 12/24/21 1700 12/25/21 0235 12/26/21 0636  WBC 12.6*   < > 10.9* 6.6 5.1  --  7.3 8.5  NEUTROABS 8.5*  --   --   --   --   --   --    --   HGB 9.3*   < > 9.2* 7.2* 6.8* 8.5* 9.3* 8.2*  HCT 27.8*   < > 28.3* 21.8* 21.2* 26.5* 28.4* 25.7*  MCV 105.3*   < > 107.2* 105.8* 109.3*  --  101.8* 103.6*  PLT 219   < > 222 157 147*  --  159 126*   < > = values in this interval not displayed.     Basic Metabolic Panel: Recent Labs  Lab 12/22/21 0513 12/23/21 0448 12/24/21 0258 12/25/21 0235 12/26/21 0636  NA 132* 140 145 147* 144  K 3.4* 3.0* 2.9* 3.6 3.2*  CL 100 106 109 109 109  CO2 20* '23 25 25 23  '$ GLUCOSE 176* 112* 79 95 176*  BUN '9 7 6 '$ <5* <5*  CREATININE 1.29* 1.33* 1.33* 1.19* 1.22*  CALCIUM 8.8* 9.1 9.6 9.8 9.4  MG  --  1.9  --  2.3  --   PHOS  --   --   --  3.3  --      GFR: Estimated Creatinine Clearance: 63.5 mL/min (A) (by C-G formula based on SCr of 1.22 mg/dL (H)).  Liver Function Tests: Recent Labs  Lab 12/20/21 1626 12/21/21 0400 12/22/21 0513 12/23/21 0448 12/26/21 0636  AST 39 37 35 31 40  ALT '30 28 25 18 16  '$ ALKPHOS 191* 189* 169* 129* 89  BILITOT 1.6* 1.8* 1.7* 1.1 2.1*  PROT 5.6* 5.7* 6.2* 5.8* 5.8*  ALBUMIN 2.3* 2.3* 2.8* 3.3* 4.0    No results for input(s): "LIPASE", "AMYLASE" in the last 168 hours. Recent Labs  Lab 12/23/21 0448 12/25/21 0235 12/26/21 0636  AMMONIA 37* 35 42*     Coagulation Profile: Recent Labs  Lab 12/26/21 0636  INR 1.5*    Cardiac Enzymes: No results for input(s): "CKTOTAL", "CKMB", "CKMBINDEX", "TROPONINI" in the last 168 hours.  BNP (last 3 results) No results for input(s): "PROBNP" in the last 8760 hours.  Lipid Profile: No results for input(s): "CHOL", "HDL", "LDLCALC", "TRIG", "CHOLHDL", "LDLDIRECT" in the last 72 hours.  Thyroid Function Tests: No results for input(s): "TSH", "T4TOTAL", "FREET4", "T3FREE", "THYROIDAB" in the last 72 hours.  Anemia Panel: No results for input(s): "VITAMINB12", "FOLATE", "FERRITIN", "TIBC", "IRON", "RETICCTPCT" in the last 72 hours.   Urine analysis:    Component Value Date/Time   COLORURINE  YELLOW 12/21/2021 0809   APPEARANCEUR HAZY (A) 12/21/2021 0809   LABSPEC 1.027 12/21/2021 0809   PHURINE 6.0 12/21/2021 0809   GLUCOSEU 50 (A) 12/21/2021 0809   HGBUR NEGATIVE 12/21/2021 0809   BILIRUBINUR NEGATIVE 12/21/2021 0809   KETONESUR NEGATIVE 12/21/2021 0809   PROTEINUR NEGATIVE 12/21/2021 0809   UROBILINOGEN 0.2 04/11/2011 1437   NITRITE NEGATIVE 12/21/2021 0809   LEUKOCYTESUR SMALL (A) 12/21/2021 0809    Sepsis Labs: Lactic Acid, Venous    Component Value Date/Time   LATICACIDVEN 7.5 ([place]) 12/05/2021 0238    MICROBIOLOGY: Recent Results (from the past 240 hour(s))  Blood culture (routine x 2)     Status: Abnormal (Preliminary result)   Collection Time: 12/21/21  4:00 AM   Specimen: BLOOD  Result Value Ref Range Status   Specimen Description BLOOD SITE NOT SPECIFIED  Final   Special Requests   Final    BOTTLES DRAWN AEROBIC AND ANAEROBIC Blood Culture adequate volume   Culture  Setup Time   Final    GRAM POSITIVE COCCI IN CHAINS AEROBIC BOTTLE ONLY Organism ID to follow CRITICAL RESULT CALLED TO, READ BACK BY AND VERIFIED WITH: T RUDISILL,PHARMD'@0229'$  12/22/21 [place]    Culture (A)  Final    STREPTOCOCCUS ANGINOSIS SUSCEPTIBILITIES TO FOLLOW Performed at [facility], [place] [address], [place], [place] [address]    Report Status PENDING  Incomplete  Blood Culture ID Panel (Reflexed)     Status: Abnormal   Collection Time: 12/21/21  4:00 AM  Result Value Ref Range Status   Enterococcus faecalis NOT DETECTED NOT DETECTED Final   Enterococcus Faecium NOT DETECTED NOT DETECTED Final   Listeria monocytogenes NOT DETECTED NOT DETECTED Final   Staphylococcus species NOT DETECTED  NOT DETECTED Final   Staphylococcus aureus (BCID) NOT DETECTED NOT DETECTED Final   Staphylococcus epidermidis NOT DETECTED NOT DETECTED Final   Staphylococcus lugdunensis NOT DETECTED NOT DETECTED Final   Streptococcus species DETECTED (A) NOT DETECTED Final    Comment: Not Enterococcus  species, Streptococcus agalactiae, Streptococcus pyogenes, or Streptococcus pneumoniae. CRITICAL RESULT CALLED TO, READ BACK BY AND VERIFIED WITH: T RUDISILL,PHARMD'@0229'$  12/22/21 [place]    Streptococcus agalactiae NOT DETECTED NOT DETECTED Final   Streptococcus pneumoniae NOT DETECTED NOT DETECTED Final   Streptococcus pyogenes NOT DETECTED NOT DETECTED Final   A.calcoaceticus-baumannii NOT DETECTED NOT DETECTED Final   Bacteroides fragilis NOT DETECTED NOT DETECTED Final   Enterobacterales NOT DETECTED NOT DETECTED Final   Enterobacter cloacae complex NOT DETECTED NOT DETECTED Final   Escherichia coli NOT DETECTED NOT DETECTED Final   Klebsiella aerogenes NOT DETECTED NOT DETECTED Final   Klebsiella oxytoca NOT DETECTED NOT DETECTED Final   Klebsiella pneumoniae NOT DETECTED NOT DETECTED Final   Proteus species NOT DETECTED NOT DETECTED Final   Salmonella species NOT DETECTED NOT DETECTED Final   Serratia marcescens NOT DETECTED NOT DETECTED Final   Haemophilus influenzae NOT DETECTED NOT DETECTED Final   Neisseria meningitidis NOT DETECTED NOT DETECTED Final   Pseudomonas aeruginosa NOT DETECTED NOT DETECTED Final   Stenotrophomonas maltophilia NOT DETECTED NOT DETECTED Final   Candida albicans NOT DETECTED NOT DETECTED Final   Candida auris NOT DETECTED NOT DETECTED Final   Candida glabrata NOT DETECTED NOT DETECTED Final   Candida krusei NOT DETECTED NOT DETECTED Final   Candida parapsilosis NOT DETECTED NOT DETECTED Final   Candida tropicalis NOT DETECTED NOT DETECTED Final   Cryptococcus neoformans/gattii NOT DETECTED NOT DETECTED Final    Comment: Performed at [facility], [address] [place], [place] [address]  Blood culture (routine x 2)     Status: None   Collection Time: 12/21/21  4:08 AM   Specimen: BLOOD  Result Value Ref Range Status   Specimen Description BLOOD SITE NOT SPECIFIED  Final   Special Requests   Final    BOTTLES DRAWN AEROBIC ONLY Blood Culture  results may not be optimal due to an inadequate volume of blood received in culture bottles   Culture   Final    NO GROWTH 5 DAYS Performed at [facility], [place] [address], [place], [place] [address]    Report Status 12/26/2021 FINAL  Final  Culture, body fluid w Gram Stain-bottle     Status: None   Collection Time: 12/21/21  3:21 PM   Specimen: Fluid  Result Value Ref Range Status   Specimen Description FLUID PERITONEAL  Final   Special Requests BOTTLES DRAWN AEROBIC AND ANAEROBIC  Final   Culture   Final    NO GROWTH 5 DAYS Performed at [facility], [place] [address], [place], [place] [address]    Report Status 12/26/2021 FINAL  Final  Gram stain     Status: None   Collection Time: 12/21/21  3:21 PM   Specimen: Fluid  Result Value Ref Range Status   Specimen Description FLUID PERITONEAL  Final   Special Requests NONE  Final   Gram Stain   Final    WBC PRESENT,BOTH PMN AND MONONUCLEAR NO ORGANISMS SEEN CYTOSPIN SMEAR Performed at [facility], [address], [place], [place] [address]    Report Status 12/21/2021 FINAL  Final  Culture, blood (Routine X 2) w Reflex to ID Panel  Status: None (Preliminary result)   Collection Time: 12/22/21  6:16 PM   Specimen: BLOOD RIGHT HAND  Result Value Ref Range Status   Specimen Description BLOOD RIGHT HAND  Final   Special Requests   Final    BOTTLES DRAWN AEROBIC AND ANAEROBIC Blood Culture results may not be optimal due to an inadequate volume of blood received in culture bottles   Culture   Final    NO GROWTH 4 DAYS Performed at [facility], [place] [address], [place], [place] [address]    Report Status PENDING  Incomplete  Culture, blood (Routine X 2) w Reflex to ID Panel     Status: None (Preliminary result)   Collection Time: 12/22/21  6:16 PM   Specimen: BLOOD  Result Value Ref Range Status   Specimen Description BLOOD SITE NOT SPECIFIED  Final   Special Requests   Final    BOTTLES DRAWN AEROBIC AND  ANAEROBIC Blood Culture adequate volume   Culture   Final    NO GROWTH 4 DAYS Performed at [facility], [address], [place], [place] [address]    Report Status PENDING  Incomplete  Urine Culture     Status: None   Collection Time: 12/25/21  8:51 AM   Specimen: Urine, Catheterized  Result Value Ref Range Status   Specimen Description URINE, CATHETERIZED  Final   Special Requests NONE  Final   Culture   Final    NO GROWTH Performed at [facility], [place] [address] [place], [place] [address]    Report Status 12/26/2021 FINAL  Final    RADIOLOGY STUDIES/RESULTS: DG CHEST PORT 1 VIEW  Result Date: 12/25/2021 CLINICAL DATA:  Hypoxia. EXAM: PORTABLE CHEST 1 VIEW COMPARISON:  12/20/2021 FINDINGS: 0604 hours. Low volume film. Diffuse interstitial and hazy parahilar opacity bilaterally is new in the interval. Probable atelectasis in the lung bases. No substantial pleural effusion. Cardiopericardial silhouette is at upper limits of normal for size. The visualized bony structures of the thorax are unremarkable. Telemetry leads overlie the chest. IMPRESSION: Interval development of diffuse interstitial and hazy parahilar opacity. Imaging features suggest edema. Electronically Signed   By: Misty Stanley M.D.   On: 12/25/2021 06:28   MR BRAIN WO CONTRAST  Result Date: 12/25/2021 CLINICAL DATA:  Initial evaluation for mental status change, unknown cause. EXAM: MRI HEAD WITHOUT CONTRAST TECHNIQUE: Multiplanar, multiecho pulse sequences of the brain and surrounding structures were obtained without intravenous contrast. COMPARISON:  Prior CT from 12/09/2021. FINDINGS: Brain: Examination technically limited as the patient was unable to tolerate the exam. Axial and coronal DWI sequences only were performed. Diffusion-weighted imaging demonstrates no evidence for acute or subacute infarct. No visible mass lesion, mass effect, or midline shift. No visible areas of chronic cortical infarction.  Ventricles normal size without hydrocephalus. No extra-axial fluid collection. Vascular: Not well assessed on this limited exam. Skull and upper cervical spine: Not well assessed on this limited exam. Sinuses/Orbits: Not well assessed on this limited exam. Other: None. IMPRESSION: 1. Technically limited study due to the patient's inability to tolerate the exam. Axial and coronal DWI sequences only were performed. 2. No acute or subacute intracranial infarct. No other definite intracranial abnormality on this limited exam. Electronically Signed   By: Jeannine Boga M.D.   On: 12/25/2021 01:54   VAS [place] UPPER EXTREMITY VENOUS DUPLEX  Result Date: 12/24/2021 UPPER VENOUS STUDY  Patient Name:  SHAKEELA RABADAN  Date of Exam:   12/24/2021  Medical Rec #: [id number]     Accession #:    [id number] Date of Birth: Jul 07, 1980      Patient Gender: F Patient Age:   41 years Exam Location:  [facility] Procedure:      VAS [place] UPPER EXTREMITY VENOUS DUPLEX Referring Phys: Oren Binet --------------------------------------------------------------------------------  Indications: Swelling Limitations: Patient unable to follow commands, restraints, bandaging. Comparison Study: No prior studies. Performing Technologist: Darlin Coco RDMS, RVT  Examination Guidelines: A complete evaluation includes B-mode imaging, spectral Doppler, color Doppler, and power Doppler as needed of all accessible portions of each vessel. Bilateral testing is considered an integral part of a complete examination. Limited examinations for reoccurring indications may be performed as noted.  Right Findings: +----------+------------+---------+-----------+----------+---------------------+ RIGHT     CompressiblePhasicitySpontaneousProperties       Summary        +----------+------------+---------+-----------+----------+---------------------+ IJV           Full       Yes       Yes                                     +----------+------------+---------+-----------+----------+---------------------+ Subclavian               Yes       Yes                                    +----------+------------+---------+-----------+----------+---------------------+ Axillary      Full       Yes       Yes                                    +----------+------------+---------+-----------+----------+---------------------+ Brachial      Full                                                        +----------+------------+---------+-----------+----------+---------------------+ Radial        Full                                                        +----------+------------+---------+-----------+----------+---------------------+ Ulnar                                                Unable to visualize                                                       due to positioning   +----------+------------+---------+-----------+----------+---------------------+ Cephalic      None       No        No  Acute         +----------+------------+---------+-----------+----------+---------------------+ Basilic                                              Unable to visualize                                                       due to positioning   +----------+------------+---------+-----------+----------+---------------------+  Left Findings: +----------+------------+---------+-----------+----------+-------+ LEFT      CompressiblePhasicitySpontaneousPropertiesSummary +----------+------------+---------+-----------+----------+-------+ IJV                      Yes       Yes                      +----------+------------+---------+-----------+----------+-------+ Subclavian               Yes       Yes                      +----------+------------+---------+-----------+----------+-------+ Axillary      Full       Yes       Yes                       +----------+------------+---------+-----------+----------+-------+ Brachial      Full                                          +----------+------------+---------+-----------+----------+-------+ Radial        Full                                          +----------+------------+---------+-----------+----------+-------+ Ulnar         Full                                          +----------+------------+---------+-----------+----------+-------+ Cephalic      None       No        No                Acute  +----------+------------+---------+-----------+----------+-------+ Basilic       Full                                          +----------+------------+---------+-----------+----------+-------+  Summary:  Right: No evidence of deep vein thrombosis in the upper extremity. Findings consistent with acute superficial vein thrombosis involving the right cephalic vein.  Left: No evidence of deep vein thrombosis in the upper extremity. Findings consistent with acute superficial vein thrombosis involving the left cephalic vein. However, unable to visualize the basilic vein and ulnar veins.  *See table(s) above for measurements and observations.  Diagnosing physician: Monica Martinez MD Electronically signed by Monica Martinez MD on 12/24/2021 at 5:06:18 PM.  Final      LOS: 5 days   Oren Binet, MD  [facility]    To contact the attending provider between 7A-7P or the covering provider during after hours 7P-7A, please log into the web site [email or web address] and access using universal [place] password for that web site. If you do not have the password, please call the hospital operator.  12/26/2021, 12:28 PM

## 2021-12-26 NOTE — Progress Notes (Signed)
[facility]  Date of Admission:  12/20/2021      Total days of antibiotics 6  Ceftriaxone 9/2 (higher dose)           ASSESSMENT: Anna Garcia is a 41 y.o. female with T1DM, gastroparesis, obesity, cirrhosis admitted from home a few days after recent discharge (for DKA management) for FTT/Weakness. Found to have SBP and bacteremia 1/2 sets with strep anginosis. Repeat blood cultures have not re-grown anything preliminarily now from 9/2. CT A/P without any abscess. Lower risk for IE with this organism. Will treat 7d total for bacteremia (likely translocation from gut in setting of cirrhosis.)   Paracentesis on 9/01 met criteria for SBP --> cultures negative on IV rocephin, day 6 of treatment for this. WBC improving, no fevers. With neuropsych features as well as uncertain etiology for cirrhosis she is going for liver biopsy to r/o Wilson's disease. U/S today for bx did not reveal any further ascitic fluid.   Confusion with recent episode of fever early 9/5 --> concern for possible aspiration event. Will continue metronidazole through Friday then stop.  No evidence of cellulitis - all appears c/w chronic stasis dermatitis.   For SBP prophylaxis will start Bactrim daily on Saturday 9/9 (avoid FQ with prolonged QTc)   ID will sign off - please call back with any questions or changes in patient's condition    PLAN: Continue Ceftriaxone 2gm through Friday 9/8 Saturday 9/9 to start bactrim once daily  OK to continue the metronidazole for concern over aspiration - 7d.     Principal Problem:   Cirrhosis of liver with ascites, unspecified hepatic cirrhosis type (HCC) Active Problems:   Type 1 diabetes mellitus (HCC)   Sinus tachycardia   Moderate persistent asthma   Macrocytic anemia   Hypotension   Cellulitis of foot, right   Generalized weakness   Alcohol use   Chronic urinary retention   Anasarca   Encephalopathy acute   Sepsis (HCC)   SBP  (spontaneous bacterial peritonitis) (HCC)    sodium chloride   Intravenous Once   Chlorhexidine Gluconate Cloth  6 each Topical Q0600   dicyclomine  10 mg Oral TID AC & HS   enoxaparin (LOVENOX) injection  40 mg Subcutaneous Q24H   famotidine  40 mg Oral BID   folic acid  2 mg Oral Daily   insulin aspart  0-6 Units Subcutaneous TID WC   insulin glargine-yfgn  6 Units Subcutaneous BID   lactulose  10 g Oral BID   lipase/protease/amylase  36,000 Units Oral TID WC   metoCLOPramide  10 mg Oral TID AC   midodrine  10 mg Oral TID WC   mometasone-formoterol  2 puff Inhalation BID   montelukast  10 mg Oral QHS   multivitamin with minerals  1 tablet Oral Daily   QUEtiapine  50 mg Oral QHS   rosuvastatin  20 mg Oral QHS   sodium chloride flush  3 mL Intravenous Q12H   spironolactone  100 mg Oral Daily   triamcinolone   Topical BID    SUBJECTIVE: Arousable, confused. Moaning and garbled speech.    Review of Systems: Review of Systems  Unable to perform ROS: Mental status change    No Known Allergies  OBJECTIVE: Vitals:   12/26/21 0418 12/26/21 0500 12/26/21 0753 12/26/21 1042  BP: (!) 124/111  116/66 118/68  Pulse: (!) 120  (!) 117 (!) 104  Resp: 15  (!) 24 (!) 22  Temp: 98 F (36.7 C)  98.4 F (36.9 C)   TempSrc:   Oral   SpO2:   93% 92%  Weight:  82 kg    Height:       Body mass index is 31.03 kg/m.  Physical Exam Constitutional:      Appearance: She is ill-appearing.     Comments: Groaning/grimacing in bed with any touch. Appears uncomfortable.   Cardiovascular:     Rate and Rhythm: Regular rhythm. Tachycardia present.  Pulmonary:     Effort: Pulmonary effort is normal.     Breath sounds: Normal breath sounds.  Abdominal:     Palpations: Abdomen is soft.     Tenderness: There is abdominal tenderness.  Skin:    General: Skin is warm and dry.     Comments: B/L LE edema, pitting with stasis dermatitis involving both legs.   Neurological:     Mental Status:  She is disoriented.     Comments: Upper body twitching noted      Lab Results Lab Results  Component Value Date   WBC 8.5 12/26/2021   HGB 8.2 (L) 12/26/2021   HCT 25.7 (L) 12/26/2021   MCV 103.6 (H) 12/26/2021   PLT 126 (L) 12/26/2021    Lab Results  Component Value Date   CREATININE 1.22 (H) 12/26/2021   BUN <5 (L) 12/26/2021   NA 144 12/26/2021   K 3.2 (L) 12/26/2021   CL 109 12/26/2021   CO2 23 12/26/2021    Lab Results  Component Value Date   ALT 16 12/26/2021   AST 40 12/26/2021   GGT 1,054 (H) 11/19/2021   ALKPHOS 89 12/26/2021   BILITOT 2.1 (H) 12/26/2021     Microbiology: Recent Results (from the past 240 hour(s))  Blood culture (routine x 2)     Status: Abnormal (Preliminary result)   Collection Time: 12/21/21  4:00 AM   Specimen: BLOOD  Result Value Ref Range Status   Specimen Description BLOOD SITE NOT SPECIFIED  Final   Special Requests   Final    BOTTLES DRAWN AEROBIC AND ANAEROBIC Blood Culture adequate volume   Culture  Setup Time   Final    GRAM POSITIVE COCCI IN CHAINS AEROBIC BOTTLE ONLY Organism ID to follow CRITICAL RESULT CALLED TO, READ BACK BY AND VERIFIED WITH: T [email or web address]  12/22/21 [place]    Culture (A)  Final    STREPTOCOCCUS ANGINOSIS SUSCEPTIBILITIES TO FOLLOW Performed at [facility], [address], [place], [place] [address]    Report Status PENDING  Incomplete  Blood Culture ID Panel (Reflexed)     Status: Abnormal   Collection Time: 12/21/21  4:00 AM  Result Value Ref Range Status   Enterococcus faecalis NOT DETECTED NOT DETECTED Final   Enterococcus Faecium NOT DETECTED NOT DETECTED Final   Listeria monocytogenes NOT DETECTED NOT DETECTED Final   Staphylococcus species NOT DETECTED NOT DETECTED Final   Staphylococcus aureus (BCID) NOT DETECTED NOT DETECTED Final   Staphylococcus epidermidis NOT DETECTED NOT DETECTED Final   Staphylococcus lugdunensis NOT DETECTED NOT DETECTED Final   Streptococcus species  DETECTED (A) NOT DETECTED Final    Comment: Not Enterococcus species, Streptococcus agalactiae, Streptococcus pyogenes, or Streptococcus pneumoniae. CRITICAL RESULT CALLED TO, READ BACK BY AND VERIFIED WITH: T RUDISILL,[email or web address]  12/22/21 [place]    Streptococcus agalactiae NOT DETECTED NOT DETECTED Final   Streptococcus pneumoniae NOT DETECTED NOT DETECTED Final   Streptococcus pyogenes NOT DETECTED NOT DETECTED Final   A.calcoaceticus-baumannii NOT DETECTED NOT  DETECTED Final   Bacteroides fragilis NOT DETECTED NOT DETECTED Final   Enterobacterales NOT DETECTED NOT DETECTED Final   Enterobacter cloacae complex NOT DETECTED NOT DETECTED Final   Escherichia coli NOT DETECTED NOT DETECTED Final   Klebsiella aerogenes NOT DETECTED NOT DETECTED Final   Klebsiella oxytoca NOT DETECTED NOT DETECTED Final   Klebsiella pneumoniae NOT DETECTED NOT DETECTED Final   Proteus species NOT DETECTED NOT DETECTED Final   Salmonella species NOT DETECTED NOT DETECTED Final   Serratia marcescens NOT DETECTED NOT DETECTED Final   Haemophilus influenzae NOT DETECTED NOT DETECTED Final   Neisseria meningitidis NOT DETECTED NOT DETECTED Final   Pseudomonas aeruginosa NOT DETECTED NOT DETECTED Final   Stenotrophomonas maltophilia NOT DETECTED NOT DETECTED Final   Candida albicans NOT DETECTED NOT DETECTED Final   Candida auris NOT DETECTED NOT DETECTED Final   Candida glabrata NOT DETECTED NOT DETECTED Final   Candida krusei NOT DETECTED NOT DETECTED Final   Candida parapsilosis NOT DETECTED NOT DETECTED Final   Candida tropicalis NOT DETECTED NOT DETECTED Final   Cryptococcus neoformans/gattii NOT DETECTED NOT DETECTED Final    Comment: Performed at [facility], [place] [address], [place], [place] [address]  Blood culture (routine x 2)     Status: None   Collection Time: 12/21/21  4:08 AM   Specimen: BLOOD  Result Value Ref Range Status   Specimen Description BLOOD SITE NOT SPECIFIED  Final   Special  Requests   Final    BOTTLES DRAWN AEROBIC ONLY Blood Culture results may not be optimal due to an inadequate volume of blood received in culture bottles   Culture   Final    NO GROWTH 5 DAYS Performed at [facility], [place] [address], [place], [place] [address]    Report Status 12/26/2021 FINAL  Final  Culture, body fluid w Gram Stain-bottle     Status: None   Collection Time: 12/21/21  3:21 PM   Specimen: Fluid  Result Value Ref Range Status   Specimen Description FLUID PERITONEAL  Final   Special Requests BOTTLES DRAWN AEROBIC AND ANAEROBIC  Final   Culture   Final    NO GROWTH 5 DAYS Performed at [facility], [place] [address] [place], [place] [address]    Report Status 12/26/2021 FINAL  Final  Gram stain     Status: None   Collection Time: 12/21/21  3:21 PM   Specimen: Fluid  Result Value Ref Range Status   Specimen Description FLUID PERITONEAL  Final   Special Requests NONE  Final   Gram Stain   Final    WBC PRESENT,BOTH PMN AND MONONUCLEAR NO ORGANISMS SEEN CYTOSPIN SMEAR Performed at [facility], [address] [place], [place] [address]    Report Status 12/21/2021 FINAL  Final  Culture, blood (Routine X 2) w Reflex to ID Panel     Status: None (Preliminary result)   Collection Time: 12/22/21  6:16 PM   Specimen: BLOOD RIGHT HAND  Result Value Ref Range Status   Specimen Description BLOOD RIGHT HAND  Final   Special Requests   Final    BOTTLES DRAWN AEROBIC AND ANAEROBIC Blood Culture results may not be optimal due to an inadequate volume of blood received in culture bottles   Culture   Final    NO GROWTH 4 DAYS Performed at [facility], [place] [address] [place], [place] [address]    Report Status PENDING  Incomplete  Culture, blood (Routine X 2) w Reflex to ID Panel     Status: None (Preliminary result)   Collection Time: 12/22/21  6:16 PM   Specimen: BLOOD  Result Value Ref Range Status   Specimen Description BLOOD SITE NOT SPECIFIED   Final   Special Requests   Final    BOTTLES DRAWN AEROBIC AND ANAEROBIC Blood Culture adequate volume   Culture   Final    NO GROWTH 4 DAYS Performed at [facility], [address], [place], [place] [address]    Report Status PENDING  Incomplete  Urine Culture     Status: None   Collection Time: 12/25/21  8:51 AM   Specimen: Urine, Catheterized  Result Value Ref Range Status   Specimen Description URINE, CATHETERIZED  Final   Special Requests NONE  Final   Culture   Final    NO GROWTH Performed at [facility], [place] [address], [place], [place] [address]    Report Status 12/26/2021 FINAL  Final      Janene Madeira, MSN, NP-C [facility] [email or web address] Pager: [phone] Office: [phone] [facility] Main Line: [place] Communication Welcome

## 2021-12-26 NOTE — Progress Notes (Signed)
OT Cancellation Note  Patient Details Name: Anna Garcia MRN: [id number] DOB: [birth year]   Cancelled Treatment:    Reason Eval/Treat Not Completed: Patient at procedure or test/ unavailable Patient off floor, awaiting liver biopsy. If biopsy is attainable, patient will need to be on bedrest for several hours. OT will follow back at later date to complete treatment and progress towards functional independence.   Corinne Ports E. Jareli [place], OTR/L Acute Rehabilitation Services [phone]   Ascencion Dike 12/26/2021, 10:22 AM

## 2021-12-26 NOTE — Progress Notes (Signed)
Physical Therapy Treatment Patient Details Name: Anna Garcia MRN: [id number] DOB: 1980/05/26 Today's Date: 12/26/2021   History of Present Illness Pt is a 41 y/o female admitted secondary to LE swelling, RLE cellulitis, and liver cirrhosis. Liver biopsy 9/6. PMH includes DM, alcohol abuse, fibromyalgia, chronic pain syndrome, HTN, chronic urinary retention with self caths.    PT Comments    Pt is limited by confusion and AMS this session, requiring increased time to follow commands and maintaining eyes closed often during session. Pt requires physical assistance to perform all functional mobility, with significant instability noted with brief ambulation attempt. Pt is at a high risk for falls due to impaired cognition and balance, PT updates recommendations to SNF due to this.   Recommendations for follow up therapy are one component of a multi-disciplinary discharge planning process, led by the attending physician.  Recommendations may be updated based on patient status, additional functional criteria and insurance authorization.  Follow Up Recommendations  Skilled nursing-short term rehab (<3 hours/day) Can patient physically be transported by private vehicle: No   Assistance Recommended at Discharge Intermittent Supervision/Assistance  Patient can return home with the following A lot of help with walking and/or transfers;A lot of help with bathing/dressing/bathroom;Assistance with cooking/housework;Direct supervision/assist for medications management;Direct supervision/assist for financial management;Assist for transportation;Help with stairs or ramp for entrance   Equipment Recommendations  Rolling walker (2 wheels)    Recommendations for Other Services       Precautions / Restrictions Precautions Precautions: Fall Restrictions Weight Bearing Restrictions: No     Mobility  Bed Mobility Overal bed mobility: Needs Assistance Bed Mobility: Supine to Sit, Sit to Supine      Supine to sit: Mod assist Sit to supine: Max assist        Transfers Overall transfer level: Needs assistance Equipment used: 1 person hand held assist Transfers: Sit to/from Stand Sit to Stand: Min assist                Ambulation/Gait Ambulation/Gait assistance: Mod assist Gait Distance (Feet): 8 Feet Assistive device: 1 person hand held assist Gait Pattern/deviations: Step-to pattern, Trunk flexed Gait velocity: reduced Gait velocity interpretation: <1.31 ft/sec, indicative of household ambulator   General Gait Details: pt with slowed step-to gait, PT providing assistance via hand hold and at hip to aide in weight shift and direction. Pt often with eyes closed during session   Stairs             Wheelchair Mobility    Modified Rankin (Stroke Patients Only)       Balance Overall balance assessment: Needs assistance Sitting-balance support: No upper extremity supported, Feet supported Sitting balance-Leahy Scale: Fair     Standing balance support: Single extremity supported, Bilateral upper extremity supported Standing balance-Leahy Scale: Poor Standing balance comment: min-modA                            Cognition Arousal/Alertness: Awake/alert Behavior During Therapy: Impulsive Overall Cognitive Status: Impaired/Different from baseline Area of Impairment: Orientation, Attention, Memory, Following commands, Safety/judgement, Awareness, Problem solving                 Orientation Level: Disoriented to, Time, Place, Situation Current Attention Level: Focused Memory: Decreased recall of precautions, Decreased short-term memory Following Commands: Follows one step commands with increased time Safety/Judgement: Decreased awareness of safety, Decreased awareness of deficits Awareness: Intellectual Problem Solving: Slow processing, Decreased initiation, Requires verbal cues, Requires tactile cues  Exercises       General Comments General comments (skin integrity, edema, etc.): tachy into 120s with mobility, SpO2 stable on room air      Pertinent Vitals/Pain Pain Assessment Pain Assessment: Faces Faces Pain Scale: Hurts even more Pain Location: generalized Pain Descriptors / Indicators: Moaning Pain Intervention(s): Monitored during session    Home Living                          Prior Function            PT Goals (current goals can now be found in the care plan section) Acute Rehab PT Goals Patient Stated Goal: pt does not state Progress towards PT goals: Progressing toward goals    Frequency    Min 3X/week      PT Plan Discharge plan needs to be updated    Co-evaluation              AM-PAC PT "6 Clicks" Mobility   Outcome Measure  Help needed turning from your back to your side while in a flat bed without using bedrails?: A Little Help needed moving from lying on your back to sitting on the side of a flat bed without using bedrails?: A Lot Help needed moving to and from a bed to a chair (including a wheelchair)?: A Lot Help needed standing up from a chair using your arms (e.g., wheelchair or bedside chair)?: A Little Help needed to walk in hospital room?: Total Help needed climbing 3-5 steps with a railing? : Total 6 Click Score: 12    End of Session   Activity Tolerance: Patient limited by fatigue Patient left: in bed;with call bell/phone within reach;with bed alarm set;with restraints reapplied;with nursing/sitter in room Nurse Communication: Mobility status PT Visit Diagnosis: History of falling (Z91.81);Difficulty in walking, not elsewhere classified (R26.2);Pain Pain - part of body: Leg     Time: 1950-9326 PT Time Calculation (min) (ACUTE ONLY): 28 min  Charges:  $Gait Training: 8-22 mins $Therapeutic Activity: 8-22 mins                     Zenaida Niece, PT, DPT Acute Rehabilitation Office [place] 12/26/2021, 1:56  PM

## 2021-12-26 NOTE — NC FL2 (Signed)
[place] LEVEL OF CARE SCREENING TOOL     IDENTIFICATION  Patient Name: Anna Garcia Birthdate: 11-Nov-1980 Sex: female Admission Date (Current Location): 12/20/2021  [facility] and [place] Number:  Herbalist and Address:  The [place]. [facility], [place] [address], [place], [place] [address]      Provider Number: [id number]  Attending Physician Name and Address:  Jonetta Osgood, MD  Relative Name and Phone Number:  [name] ) [phone]    Current Level of Care: Hospital Recommended Level of Care: [place] Prior Approval Number:    Date Approved/Denied:   PASRR Number: [id number]  Discharge Plan: SNF    Current Diagnoses: Patient Active Problem List   Diagnosis Date Noted   Encephalopathy acute    Sepsis ([place])    SBP (spontaneous bacterial peritonitis) ([place])    Cirrhosis of liver with ascites, unspecified hepatic cirrhosis type ([place]) 12/21/2021   Hypotension 12/21/2021   Cellulitis of foot, right 12/21/2021   Generalized weakness 12/21/2021   Alcohol use 12/21/2021   Chronic urinary retention 12/21/2021   Anasarca 12/21/2021   Acute confusional state    Macrocytic anemia 12/05/2021   Enteritis 12/05/2021   Chronic pain syndrome 12/05/2021   Refractory nausea and vomiting 12/04/2021   High anion gap metabolic acidosis 68/34/1962   GERD (gastroesophageal reflux disease) 11/19/2021   Moderate persistent asthma 11/19/2021   Hypomagnesemia 09/23/2021   Hypophosphatemia 09/23/2021   Fever 05/27/2021   Left genital labial abscess 05/25/2021   Sinus tachycardia    Labial swelling: Left 05/24/2021   Vulvar abscess 05/24/2021   Elevated brain natriuretic peptide (BNP) level 05/22/2021   Overweight (BMI 25.0-29.9) 05/22/2021   Hypokalemia 05/21/2021   Fibromyalgia 10/12/2020   CKD stage G3a/A3, GFR 45-59 and albumin creatinine ratio >300 mg/g ([place]) 10/12/2020   DKA (diabetic ketoacidosis) ([place])  07/05/2020   SIRS (systemic inflammatory response syndrome) ([place]) 07/05/2020   Asthma 07/05/2020   AKI (acute kidney injury) ([place]) 07/05/2020   Transaminitis 07/05/2020   Hypertension    Gastroparesis due to secondary diabetes ([place])    Type 1 diabetes mellitus ([place]) 06/19/2006   NEUROPATHY, PERIPHERAL 06/19/2006   ACNE 06/19/2006    Orientation RESPIRATION BLADDER Height & Weight     Self  Normal Continent (Urethral Catheter) Weight: 180 lb 12.4 oz (82 kg) (bed weight) Height:  '5\' 4"'$  (162.6 cm)  BEHAVIORAL SYMPTOMS/MOOD NEUROLOGICAL BOWEL NUTRITION STATUS      Incontinent Diet (Please see discharge summary)  AMBULATORY STATUS COMMUNICATION OF NEEDS Skin   Limited Assist Verbally Other (Comment) (Pale,dry,blister,serous,foot,bilateral,foam,ecchymosis,foot,bilateral,erythema,leg,L,R,weeping,foot,leg,bilateral,non-tenting,please see additional info)                       Personal Care Assistance Level of Assistance  Bathing, Feeding, Dressing Bathing Assistance: Maximum assistance Feeding assistance: Independent (able to feed self) Dressing Assistance: Maximum assistance     Functional Limitations Info  Sight, Hearing, Speech Sight Info: Adequate (WDL) Hearing Info: Adequate (WDL) Speech Info: Adequate    SPECIAL CARE FACTORS FREQUENCY  PT (By licensed PT), OT (By licensed OT)     PT Frequency: 5x min weekly OT Frequency: 5x min weekly            Contractures Contractures Info: Not present    Additional Factors Info  Code Status, Allergies, Insulin Sliding Scale, Psychotropic Code Status Info: FULL Allergies Info: No Known Allergies Psychotropic Info: QUEtiapine (SEROQUEL) tablet 50 mg daily at bedtime Insulin Sliding Scale Info:  insulin aspart (novoLOG) injection 0-6 Units 3 times daily with meals,insulin glargine-yfgn [facility]) injection 6 Units 2 times daily       Current Medications (12/26/2021):  This is the current hospital active medication  list Current Facility-Administered Medications  Medication Dose Route Frequency Provider Last Rate Last Admin   0.9 %  sodium chloride infusion (Manually program via Guardrails IV Fluids)   Intravenous Once Jonetta Osgood, MD       acetaminophen (TYLENOL) tablet 650 mg  650 mg Oral Q6H PRN Jonetta Osgood, MD   650 mg at 12/26/21 1451   albuterol (PROVENTIL) (2.5 MG/3ML) 0.083% nebulizer solution 2.5 mg  2.5 mg Nebulization Q4H PRN Ghimire, Henreitta Leber, MD       cefTRIAXone (ROCEPHIN) 2 g in sodium chloride 0.9 % 100 mL IVPB  2 g Intravenous Q24H Concord Callas, NP 200 mL/hr at 12/26/21 0849 2 g at 12/26/21 0849   Chlorhexidine Gluconate Cloth 2 % PADS 6 each  6 each Topical Q0600 Jonetta Osgood, MD   6 each at 12/26/21 0620   dextrose 5 %-0.9 % sodium chloride infusion   Intravenous Continuous Jonetta Osgood, MD 25 mL/hr at 12/26/21 0145 Restarted at 12/26/21 0145   dicyclomine (BENTYL) capsule 10 mg  10 mg Oral TID AC & HS Zada Finders R, MD   10 mg at 12/26/21 1359   enoxaparin (LOVENOX) injection 40 mg  40 mg Subcutaneous Q24H Zada Finders R, MD   40 mg at 12/25/21 1104   famotidine (PEPCID) tablet 40 mg  40 mg Oral BID Lenore Cordia, MD   40 mg at 24/23/53 6144   folic acid (FOLVITE) tablet 2 mg  2 mg Oral Daily Jonetta Osgood, MD   2 mg at 12/26/21 0850   furosemide (LASIX) tablet 40 mg  40 mg Oral Daily Ghimire, Henreitta Leber, MD       gabapentin (NEURONTIN) capsule 300 mg  300 mg Oral BID Jonetta Osgood, MD   300 mg at 12/26/21 1359   influenza vac split quadrivalent PF (FLUARIX) injection 0.5 mL  0.5 mL Intramuscular Prior to discharge Jonetta Osgood, MD       insulin aspart (novoLOG) injection 0-6 Units  0-6 Units Subcutaneous TID WC Jonetta Osgood, MD   1 Units at 12/26/21 0850   insulin glargine-yfgn (SEMGLEE) injection 6 Units  6 Units Subcutaneous BID Jonetta Osgood, MD   6 Units at 12/26/21 0851   lactulose (CHRONULAC) 10 GM/15ML solution 10 g  10  g Oral BID Jonetta Osgood, MD   10 g at 12/26/21 0850   lipase/protease/amylase (CREON) capsule 36,000 Units  36,000 Units Oral TID WC Jonetta Osgood, MD   36,000 Units at 12/26/21 1359   loperamide (IMODIUM) capsule 2 mg  2 mg Oral Daily PRN Jonetta Osgood, MD       metoCLOPramide (REGLAN) tablet 10 mg  10 mg Oral TID AC Patel, Vishal R, MD   10 mg at 12/26/21 1359   metroNIDAZOLE (FLAGYL) IVPB 500 mg  500 mg Intravenous Q12H Janene Madeira N, NP 100 mL/hr at 12/26/21 1140 500 mg at 12/26/21 1140   midodrine (PROAMATINE) tablet 10 mg  10 mg Oral TID WC Zada Finders R, MD   10 mg at 12/26/21 1359   mometasone-formoterol (DULERA) 100-5 MCG/ACT inhaler 2 puff  2 puff Inhalation BID Lenore Cordia, MD   2 puff at 12/24/21 0837   montelukast (SINGULAIR) tablet  10 mg  10 mg Oral QHS Zada Finders R, MD   10 mg at 12/25/21 2302   multivitamin with minerals tablet 1 tablet  1 tablet Oral Daily Jonetta Osgood, MD   1 tablet at 12/26/21 0850   naloxone (NARCAN) injection 0.4 mg  0.4 mg Intravenous PRN Shela Leff, MD       Oral care mouth rinse  15 mL Mouth Rinse PRN Ghimire, Henreitta Leber, MD       QUEtiapine (SEROQUEL) tablet 50 mg  50 mg Oral QHS Ghimire, Shanker M, MD       rosuvastatin (CRESTOR) tablet 20 mg  20 mg Oral QHS Jonetta Osgood, MD   20 mg at 12/25/21 2302   sodium chloride flush (NS) 0.9 % injection 3 mL  3 mL Intravenous Q12H Zada Finders R, MD   3 mL at 12/25/21 1105   spironolactone (ALDACTONE) tablet 100 mg  100 mg Oral Daily Jonetta Osgood, MD   100 mg at 12/26/21 0850   [START ON 12/29/2021] sulfamethoxazole-trimethoprim (BACTRIM DS) 800-160 MG per tablet 1 tablet  1 tablet Oral Daily Vail Callas, NP       triamcinolone (KENALOG) 0.025 % cream   Topical BID Jabier Mutton, MD   Given at 12/26/21 1421     Discharge Medications: Please see discharge summary for a list of discharge medications.  Relevant Imaging Results:  Relevant Lab  Results:   Additional Information [id number] Covid Vaccines,Wound Incision open or dehiced,foot,anterior,L,R,Red,Foam lift dressing,reinforced,clean,dry,intact,erythema,blanchable,wound incision open or dehiced,foot,anterior,R,Blister 3 x 3.5,foam lift dressing,reinforced,clean,dry,intact,PRN  Milas Gain, LCSWA

## 2021-12-26 NOTE — TOC Initial Note (Signed)
Transition of Care [facility]) - Initial/Assessment Note    Patient Details  Name: Anna Garcia MRN: [id number] Date of Birth: Jan 30, 1981  [facility] CM/SW Contact:    Anna Garcia, [place] Phone Number: 12/26/2021, 4:54 PM  Clinical Narrative:                  CSW received consult for possible SNF placement at time of discharge. Due to patients current orientation CSW spoke with patients sister-n-law Anna Garcia regarding PT recommendation of SNF placement at time of discharge. Patients sister-n-law reports patient comes from home alone. Patients sister-n-law expressed understanding of PT recommendation and is agreeable to SNF placement at time of discharge for patient. Patients sister-n-law Anna Garcia gave CSW permission to fax initial referral near the [place] area.CSW discussed insurance authorization process with patients sister-n-law.Anna Garcia reports patient has received the COVID vaccines.  No further questions reported at this time. CSW to continue to follow and assist with discharge planning needs.   Expected Discharge Plan: Skilled Nursing Facility Barriers to Discharge: Continued Medical Work up   Patient Goals and CMS Choice Patient states their goals for this hospitalization and ongoing recovery are:: To return home CMS Medicare.gov Compare Post Acute Care list provided to:: Patient Represenative (must comment) (Patients sister n Chartered loss adjuster) Choice offered to / list presented to :  (Patients sister n Chartered loss adjuster)  Expected Discharge Plan and Services Expected Discharge Plan: Skilled Nursing Facility In-house Referral: Clinical Social Work Discharge Planning Services: CM Consult Post Acute Care Choice: [place] arrangements for the past 2 months: [place]                 DME Arranged: Bedside commode, Walker rolling DME Agency: [employer] Date DME Agency Contacted: 12/24/21 Time DME Agency Contacted: 51 Representative spoke with at DME  Agency: [name] HH Arranged: PT, OT, RN, Disease Management, Nurse's Aide, Social Work [employer] Agency: [place] Date [place]: 12/24/21 Time [place]: 4 Representative spoke with at [place] Arrangements/Services Living arrangements for the past 2 months: [place], Apartment Lives with:: Self Patient language and need for interpreter reviewed:: Yes Do you feel safe going back to the place where you live?: No   SNF  Need for Family Participation in Patient Care: Yes (Comment) Care giver support system in place?: Yes (comment)   Criminal Activity/Legal Involvement Pertinent to Current Situation/Hospitalization: No - Comment as needed  Activities of Daily Living Home Assistive Devices/Equipment: None ADL Screening (condition at time of admission) Patient's cognitive ability adequate to safely complete daily activities?: Yes Is the patient deaf or have difficulty hearing?: No Does the patient have difficulty seeing, even when wearing glasses/contacts?: No Does the patient have difficulty concentrating, remembering, or making decisions?: No Patient able to express need for assistance with ADLs?: Yes Does the patient have difficulty dressing or bathing?: No Independently performs ADLs?: Yes (appropriate for developmental age) Communication: Independent Dressing (OT): Needs assistance Is this a change from baseline?: Change from baseline, expected to last <3days Grooming: Needs assistance Is this a change from baseline?: Change from baseline, expected to last <3 days Feeding: Independent Bathing: Needs assistance Is this a change from baseline?: Change from baseline, expected to last <3 days Toileting: Needs assistance Is this a change from baseline?: Change from baseline, expected to last <3 days In/Out Bed: Needs assistance Is this a change from baseline?: Change from baseline, expected to last <3 days [facility]  in Home:  Independent Does the patient have difficulty walking or climbing stairs?: Yes Weakness of Legs: Both Weakness of Arms/Hands: None  Permission Sought/Granted Permission sought to share information with : Case Manager, Family Supports, Chartered certified accountant granted to share information with : No  Share Information with NAME: Due to patients current orientation CSW spoke with her sister n law Anna Garcia  Permission granted to share info w AGENCY: Due to patients current orientation CSW spoke with her sister n law Anna Garcia/SNF  Permission granted to share info w Relationship: Due to patients current orientation CSW spoke with her sister n Banker n law  Permission granted to share info w Contact Information: Due to patients current orientation CSW spoke with her sister n law Anna Garcia [phone]  Emotional Assessment     Affect (typically observed): Withdrawn Orientation: : Oriented to Self Alcohol / Substance Use: Not Applicable Psych Involvement: No (comment)  Admission diagnosis:  Anasarca [R60.1] Cirrhosis of liver with ascites, unspecified hepatic cirrhosis type ([place]) [K74.60, R18.8] Hypervolemia, unspecified hypervolemia type [E87.70] Sepsis, due to unspecified organism, unspecified whether acute organ dysfunction present [facility]) [A41.9] Patient Active Problem List   Diagnosis Date Noted   Encephalopathy acute    Sepsis ([place])    SBP (spontaneous bacterial peritonitis) ([place])    Cirrhosis of liver with ascites, unspecified hepatic cirrhosis type ([place]) 12/21/2021   Hypotension 12/21/2021   Cellulitis of foot, right 12/21/2021   Generalized weakness 12/21/2021   Alcohol use 12/21/2021   Chronic urinary retention 12/21/2021   Anasarca 12/21/2021   Acute confusional state    Macrocytic anemia 12/05/2021   Enteritis 12/05/2021   Chronic pain syndrome 12/05/2021   Refractory nausea and vomiting 12/04/2021   High anion gap metabolic acidosis 67/34/1937   GERD  (gastroesophageal reflux disease) 11/19/2021   Moderate persistent asthma 11/19/2021   Hypomagnesemia 09/23/2021   Hypophosphatemia 09/23/2021   Fever 05/27/2021   Left genital labial abscess 05/25/2021   Sinus tachycardia    Labial swelling: Left 05/24/2021   Vulvar abscess 05/24/2021   Elevated brain natriuretic peptide (BNP) level 05/22/2021   Overweight (BMI 25.0-29.9) 05/22/2021   Hypokalemia 05/21/2021   Fibromyalgia 10/12/2020   CKD stage G3a/A3, GFR 45-59 and albumin creatinine ratio >300 mg/g ([place]) 10/12/2020   DKA (diabetic ketoacidosis) ([place]) 07/05/2020   SIRS (systemic inflammatory response syndrome) ([place]) 07/05/2020   Asthma 07/05/2020   AKI (acute kidney injury) ([place]) 07/05/2020   Transaminitis 07/05/2020   Hypertension    Gastroparesis due to secondary diabetes ([place])    Type 1 diabetes mellitus ([place]) 06/19/2006   NEUROPATHY, PERIPHERAL 06/19/2006   ACNE 06/19/2006   PCP:  Day, [name], MD Pharmacy:   [place], [place] - [address] [place] [address] Phone: [phone] Fax: [phone]     Social Determinants of Health (SDOH) Interventions    Readmission Risk Interventions    12/14/2021   10:09 AM 09/24/2021    1:14 PM  Readmission Risk Prevention Plan  Transportation Screening Complete   PCP or Specialist Appt within 3-5 Days Complete   HRI or Home Care Consult Complete Complete  Social Work Consult for [place] Planning/Counseling Complete Complete  Palliative Care Screening Not Applicable Not Applicable  Medication Review Press photographer) Complete

## 2021-12-27 DIAGNOSIS — G9340 Encephalopathy, unspecified: Secondary | ICD-10-CM | POA: Diagnosis not present

## 2021-12-27 DIAGNOSIS — R601 Generalized edema: Secondary | ICD-10-CM | POA: Diagnosis not present

## 2021-12-27 DIAGNOSIS — K7460 Unspecified cirrhosis of liver: Secondary | ICD-10-CM | POA: Diagnosis not present

## 2021-12-27 DIAGNOSIS — A419 Sepsis, unspecified organism: Secondary | ICD-10-CM | POA: Diagnosis not present

## 2021-12-27 DIAGNOSIS — Z789 Other specified health status: Secondary | ICD-10-CM | POA: Diagnosis not present

## 2021-12-27 LAB — GLUCOSE, CAPILLARY
Glucose-Capillary: 244 mg/dL — ABNORMAL HIGH (ref 70–99)
Glucose-Capillary: 291 mg/dL — ABNORMAL HIGH (ref 70–99)
Glucose-Capillary: 296 mg/dL — ABNORMAL HIGH (ref 70–99)
Glucose-Capillary: 337 mg/dL — ABNORMAL HIGH (ref 70–99)
Glucose-Capillary: 361 mg/dL — ABNORMAL HIGH (ref 70–99)
Glucose-Capillary: 487 mg/dL — ABNORMAL HIGH (ref 70–99)

## 2021-12-27 LAB — BASIC METABOLIC PANEL
Anion gap: 13 (ref 5–15)
BUN: 7 mg/dL (ref 6–20)
CO2: 22 mmol/L (ref 22–32)
Calcium: 8.8 mg/dL — ABNORMAL LOW (ref 8.9–10.3)
Chloride: 106 mmol/L (ref 98–111)
Creatinine, Ser: 1.27 mg/dL — ABNORMAL HIGH (ref 0.44–1.00)
GFR, Estimated: 55 mL/min — ABNORMAL LOW (ref >60)
Glucose, Bld: 319 mg/dL — ABNORMAL HIGH (ref 70–99)
Potassium: 2.6 mmol/L — CL (ref 3.5–5.1)
Sodium: 141 mmol/L (ref 135–145)

## 2021-12-27 LAB — CULTURE, BLOOD (ROUTINE X 2)
Culture: NO GROWTH
Culture: NO GROWTH
Special Requests: ADEQUATE
Special Requests: ADEQUATE

## 2021-12-27 LAB — MAGNESIUM: Magnesium: 2 mg/dL (ref 1.7–2.4)

## 2021-12-27 LAB — PROCALCITONIN: Procalcitonin: 0.97 ng/mL

## 2021-12-27 LAB — SURGICAL PATHOLOGY

## 2021-12-27 MED ORDER — POTASSIUM CHLORIDE 10 MEQ/100ML IV SOLN
10.0000 meq | INTRAVENOUS | Status: AC
  Administered 2021-12-27 (×3): 10 meq via INTRAVENOUS
  Filled 2021-12-27 (×3): qty 100

## 2021-12-27 MED ORDER — INSULIN ASPART 100 UNIT/ML IJ SOLN
0.0000 [IU] | Freq: Every day | INTRAMUSCULAR | Status: DC
  Administered 2021-12-27: 5 [IU] via SUBCUTANEOUS
  Administered 2021-12-29: 4 [IU] via SUBCUTANEOUS
  Administered 2021-12-30: 5 [IU] via SUBCUTANEOUS

## 2021-12-27 MED ORDER — INSULIN GLARGINE-YFGN 100 UNIT/ML ~~LOC~~ SOLN
10.0000 [IU] | Freq: Two times a day (BID) | SUBCUTANEOUS | Status: DC
  Administered 2021-12-27 – 2021-12-28 (×3): 10 [IU] via SUBCUTANEOUS
  Filled 2021-12-27 (×4): qty 0.1

## 2021-12-27 MED ORDER — ENSURE ENLIVE PO LIQD
237.0000 mL | Freq: Two times a day (BID) | ORAL | Status: DC
  Administered 2021-12-27 – 2022-01-01 (×8): 237 mL via ORAL

## 2021-12-27 MED ORDER — POTASSIUM CHLORIDE 10 MEQ/100ML IV SOLN
10.0000 meq | INTRAVENOUS | Status: AC
  Administered 2021-12-27: 10 meq via INTRAVENOUS
  Filled 2021-12-27: qty 100

## 2021-12-27 MED ORDER — INSULIN ASPART 100 UNIT/ML IJ SOLN
10.0000 [IU] | Freq: Once | INTRAMUSCULAR | Status: AC
Start: 2021-12-27 — End: 2021-12-27
  Administered 2021-12-27: 10 [IU] via SUBCUTANEOUS

## 2021-12-27 MED ORDER — INSULIN GLARGINE-YFGN 100 UNIT/ML ~~LOC~~ SOLN: 4.0000 [IU] | Freq: Two times a day (BID) | SUBCUTANEOUS | Status: DC

## 2021-12-27 MED ORDER — POTASSIUM CHLORIDE 10 MEQ/100ML IV SOLN: 10.0000 meq | INTRAVENOUS | Status: DC

## 2021-12-27 MED ORDER — MELATONIN 5 MG PO TABS
5.0000 mg | ORAL_TABLET | Freq: Every day | ORAL | Status: DC
  Administered 2021-12-27 – 2022-01-03 (×7): 5 mg via ORAL
  Filled 2021-12-27 (×7): qty 1

## 2021-12-27 MED ORDER — POTASSIUM CHLORIDE 20 MEQ PO PACK: 40.0000 meq | PACK | Freq: Four times a day (QID) | ORAL | Status: DC

## 2021-12-27 NOTE — Progress Notes (Signed)
Pt has been restless and unable to sleep all night. She seems agitated when wearing the mittens and is able to quickly remove them when they are put on. She inevitably then tries to pull her lines. She was finally able to remove her foley during a period of agitation. Earlier in the night she c/o pain in her abdomen and at the insertion site of the foley catheter. Catheter placement checked and verified to be draining well. Foley care completed. Pt still c/o pain from foley. Now that foley has been removed, patient is finally resting for the first time tonight. MD notified of removal of catheter. Will bladderscan and I/O for duration of night per MD.

## 2021-12-27 NOTE — Progress Notes (Addendum)
PROGRESS NOTE        PATIENT DETAILS Name: Anna Garcia Age: 41 y.o. Sex: female Date of Birth: 10-17-1980 Admit Date: 12/20/2021 Admitting Physician Evalee Mutton Kristeen Mans, MD PCP:Day, [name], MD  Brief Summary: Patient is a 41 y.o.  female with history of DM-1, gastroparesis, chronic pain syndrome/fibromyalgia, HTN, chronic urinary retention-self caths at home-who was just discharged from this facility on 8/29 after being treated for DKA/AKI/volume overload-presented back to the hospital with anasarca-3+ pitting edema in the lower extremity up to the thighs.  She was found to have SBP in Streptococcus bacteremia.  Further hospital course was complicated by development of encephalopathy and acute hypoxic respiratory failure likely due to aspiration pneumonia.  See below for further details.  Significant events: 8/22-8/29>> hospitalization for DKA/AKI-found to have liver cirrhosis 8/31>> admit for anasarca/significant lower extremity edema. 9/01>> IV albumin started-midodrine dosage escalated. 9/03>> hallucinating-confused. 9/05>> hypoxic after Ativan given for MRI brain-started on Ventimask.  Transfer to progressive care-subsequently titrated down to 3-4 L.  Significant studies: 2/23>>Hbs Ag, HCV PY:[id number] 8/24>> bilateral lower extremity Doppler: No DVT. 8/28>>ANA/RA/Anti mitrochondrial Ab/Anti smooth muscle ZT:[id number] 8/26>> Echo: EF-60-65%. 8/31>> CXR: No PNA 8/31>> CT chest/abdomen/pelvis: No PE, liver cirrhosis, prominent small bowel wall thickening, moderate volume ascites.  Generalized subcutaneous edema. 9/01>> UA: No proteinuria. 9/02>> 24 urine copper: 21 9/3>> MRI brain: Limited images-but no acute CVA. 9/3>> EEG: No seizures.  Significant microbiology data: 9/01>>1/2 blood culture: Streptococcus anginosus 9/01>>ascites fluid culture: No growth 9/02>> blood culture: No growth.  Procedures: 9/01>> diagnostic paracentesis-1 L (WBC 590 w 85%  neutrophils) 9/06>> liver biopsy by IR.  Consults: GI, ID  Subjective: Much more awake and alert compared to the past few days.  Still confused-still crying but able to follow simple commands.  Pulled Foley out last night.  Objective: Vitals: Blood pressure 105/60, pulse 96, temperature 99.2 F (37.3 C), temperature source Axillary, resp. rate 20, height '5\' 4"'$  (1.626 m), weight 76.8 kg, SpO2 98 %.   Exam: Gen Exam: Confused -not in any distress HEENT:atraumatic, normocephalic Chest: B/L clear to auscultation anteriorly CVS:S1S2 regular Abdomen:soft non tender, non distended Extremities:++ edema Neurology: Non focal Skin: no rash    Pertinent Labs/Radiology:    Latest Ref Rng & Units 12/26/2021    6:36 AM 12/25/2021    2:35 AM 12/24/2021    5:00 PM  CBC  WBC 4.0 - 10.5 K/uL 8.5  7.3    Hemoglobin 12.0 - 15.0 g/dL 8.2  9.3  8.5   Hematocrit 36.0 - 46.0 % 25.7  28.4  26.5   Platelets 150 - 400 K/uL 126  159      Lab Results  Component Value Date   NA 141 12/27/2021   K 2.6 (LL) 12/27/2021   CL 106 12/27/2021   CO2 22 12/27/2021      Assessment/Plan: Anasarca: Volume status significantly better-markedly less edema over the past few days.  Continue Lasix/Aldactone, -13.4 L so far.  Continues to have significant improvement in volume status, -13 L so far.  Weight down 269 pounds (189 pounds on admission)   Acute hypoxic respiratory failure likely due to aspiration pneumonia:Improved-on room air-on Rocephin/Flagyl.  Avoid benzodiazepines.   Acute metabolic encephalopathy: Likely due to delirium-discussed with neurology-Dr. Carmelia Roller further work-up recommended.  Seems to be slowly improving-although confused-appears to be more alert/awake than the past few days.  Add melatonin-continue Seroquel nightly.   Streptococcus bacteremia/spine drainage bacterial peritonitis: Remains on IV Rocephin (stop date 9/8)-ID recommending that we transition to Bactrim prophylaxis once daily on  9/9.   Cellulitis right foot: Improved-continue IV antibiotics.  AKI: Significant improvement in creatinine-although volume status has improved-still somewhat volume overloaded-continue diuretics.      Hypokalemia: Repleted-check magnesium and bmet with a.m. labs.  Liver cirrhosis: Diagnosed recently on imaging studies-hepatitis serology/autoimmune work-up negative.  Recent EGD June 2023 did not show any obvious varices.  Suspicion for NASH or Wilson's disease (low ceruloplasmin levels but 24 urine copper not elevated).    Spoke with ophthalmologist-Dr. Patel-9/5-almost impossible to see KF rings without a slit-lamp exam-hence ophthalmology referral will need to be done in the outpatient setting.  Spoke with sister-in-law-[name]-no family history of liver issues that she is aware of.  Underwent liver biopsy on 9/6-awaiting results.  Macrocytic anemia: S/p 1 unit of PRBC on 9/4-no evidence of hemolysis (LDH normal)-no evidence of blood loss.  Suspect this could be related to acute illness.  Continue to follow CBC closely.  Recent folate/B12 levels in June stable.  History of esophageal stricture: S/p dilatation June 2023.  Denies any vomiting or dysphagia symptoms.  History of diabetic gastroparesis: No vomiting-appears stable-continue Reglan.  Encourage small portion meals.  DM-1 (A1c 7.6 on 7/30): No further hypoglycemic episodes-increase Semglee to 10 units twice daily-continue SSI.  Given encephalopathy-erratic diet-episodes of hypoglycemia-allow for some permissive hyperglycemia  Recent Labs    12/26/21 2145 12/27/21 0056 12/27/21 0748  GLUCAP 407* 487* 291*     HLD: Continue statin  Moderate persistent asthma: Not in flare-continue bronchodilators.  History of chronic urinary retention-s/p self-catheterization at home: Due to worsening encephalopathy-significant urinary retention-indwelling Foley catheter placed on 9/4-however this was pulled out by the patient last night.  Since  mental status has been improved-we will avoid replacing Foley catheter-and instead do frequent bladder scans and in and out catheterization.     Chronic pain/fibromyalgia/neuropathy: Continue Neurontin.  Obesity Estimated body mass index is 29.06 kg/m as calculated from the following:   Height as of this encounter: '5\' 4"'$  (1.626 m).   Weight as of this encounter: 76.8 kg.   Code status:   Code Status: Full Code   DVT Prophylaxis: enoxaparin (LOVENOX) injection 40 mg Start: 12/21/21 1000   Family Communication: Sister-in-law-[name]-[phone]-updated over the phone on 9/5.   Disposition Plan: Status is: Observation The patient will require care spanning > 2 midnights and should be moved to inpatient because: Severe anasarca having difficulty ambulating-Will need inpatient optimization of her volume status before consideration of discharge.   Planned Discharge Destination:Home later this week.   Diet: Diet Order             Diet heart healthy/carb modified Room service appropriate? No; Fluid consistency: Thin  Diet effective now                     Antimicrobial agents: Anti-infectives (From admission, onward)    Start     Dose/Rate Route Frequency Ordered Stop   12/29/21 1000  sulfamethoxazole-trimethoprim (BACTRIM DS) 800-160 MG per tablet 1 tablet        1 tablet Oral Daily 12/26/21 1138     12/25/21 2200  metroNIDAZOLE (FLAGYL) tablet 500 mg  Status:  Discontinued        500 mg Oral Every 12 hours 12/25/21 1416 12/25/21 1421   12/25/21 2200  metroNIDAZOLE (FLAGYL) IVPB 500 mg  500 mg 100 mL/hr over 60 Minutes Intravenous Every 12 hours 12/25/21 1421 12/28/21 2359   12/25/21 1030  metroNIDAZOLE (FLAGYL) IVPB 500 mg  Status:  Discontinued        500 mg 100 mL/hr over 60 Minutes Intravenous Every 12 hours 12/25/21 0935 12/25/21 1416   12/22/21 0800  cefTRIAXone (ROCEPHIN) 2 g in sodium chloride 0.9 % 100 mL IVPB        2 g 200 mL/hr over 30 Minutes  Intravenous Every 24 hours 12/22/21 0236 12/29/21 0759   12/21/21 2100  cefTRIAXone (ROCEPHIN) 1 g in sodium chloride 0.9 % 100 mL IVPB  Status:  Discontinued        1 g 200 mL/hr over 30 Minutes Intravenous Every 24 hours 12/21/21 0055 12/22/21 0236   12/21/21 0030  cefTRIAXone (ROCEPHIN) 1 g in sodium chloride 0.9 % 100 mL IVPB  Status:  Discontinued        1 g 200 mL/hr over 30 Minutes Intravenous  Once 12/21/21 0015 12/21/21 0055   12/20/21 2145  cefTRIAXone (ROCEPHIN) 1 g in sodium chloride 0.9 % 100 mL IVPB  Status:  Discontinued        1 g 200 mL/hr over 30 Minutes Intravenous  Once 12/20/21 2139 12/20/21 2214   12/20/21 0000  cephALEXin (KEFLEX) 500 MG capsule  Status:  Discontinued        500 mg Oral 4 times daily 12/20/21 2319 12/21/21         MEDICATIONS: Scheduled Meds:  sodium chloride   Intravenous Once   Chlorhexidine Gluconate Cloth  6 each Topical Q0600   dicyclomine  10 mg Oral TID AC & HS   enoxaparin (LOVENOX) injection  40 mg Subcutaneous Q24H   famotidine  40 mg Oral BID   feeding supplement  237 mL Oral BID BM   folic acid  2 mg Oral Daily   furosemide  40 mg Oral Daily   gabapentin  300 mg Oral BID   insulin aspart  0-5 Units Subcutaneous QHS   insulin aspart  0-6 Units Subcutaneous TID WC   insulin glargine-yfgn  10 Units Subcutaneous BID   lactulose  10 g Oral BID   lipase/protease/amylase  36,000 Units Oral TID WC   melatonin  5 mg Oral QHS   metoCLOPramide  10 mg Oral TID AC   midodrine  10 mg Oral TID WC   mometasone-formoterol  2 puff Inhalation BID   montelukast  10 mg Oral QHS   multivitamin with minerals  1 tablet Oral Daily   QUEtiapine  50 mg Oral QHS   rosuvastatin  20 mg Oral QHS   sodium chloride flush  3 mL Intravenous Q12H   spironolactone  100 mg Oral Daily   [START ON 12/29/2021] sulfamethoxazole-trimethoprim  1 tablet Oral Daily   triamcinolone   Topical BID   Continuous Infusions:  cefTRIAXone (ROCEPHIN)  IV 2 g (12/27/21 0959)    metronidazole 500 mg (12/27/21 1100)   potassium chloride 10 mEq (12/27/21 1118)   PRN Meds:.acetaminophen, albuterol, influenza vac split quadrivalent PF, loperamide, naLOXone (NARCAN)  injection, mouth rinse   I have personally reviewed following labs and imaging studies  LABORATORY DATA: CBC: Recent Labs  Lab 12/20/21 1626 12/20/21 1709 12/22/21 0513 12/23/21 0448 12/24/21 0258 12/24/21 1700 12/25/21 0235 12/26/21 0636  WBC 12.6*   < > 10.9* 6.6 5.1  --  7.3 8.5  NEUTROABS 8.5*  --   --   --   --   --   --   --  HGB 9.3*   < > 9.2* 7.2* 6.8* 8.5* 9.3* 8.2*  HCT 27.8*   < > 28.3* 21.8* 21.2* 26.5* 28.4* 25.7*  MCV 105.3*   < > 107.2* 105.8* 109.3*  --  101.8* 103.6*  PLT 219   < > 222 157 147*  --  159 126*   < > = values in this interval not displayed.     Basic Metabolic Panel: Recent Labs  Lab 12/23/21 0448 12/24/21 0258 12/25/21 0235 12/26/21 0636 12/27/21 0724  NA 140 145 147* 144 141  K 3.0* 2.9* 3.6 3.2* 2.6*  CL 106 109 109 109 106  CO2 '23 25 25 23 22  '$ GLUCOSE 112* 79 95 176* 319*  BUN 7 6 <5* <5* 7  CREATININE 1.33* 1.33* 1.19* 1.22* 1.27*  CALCIUM 9.1 9.6 9.8 9.4 8.8*  MG 1.9  --  2.3  --  2.0  PHOS  --   --  3.3  --   --      GFR: Estimated Creatinine Clearance: 59 mL/min (A) (by C-G formula based on SCr of 1.27 mg/dL (H)).  Liver Function Tests: Recent Labs  Lab 12/20/21 1626 12/21/21 0400 12/22/21 0513 12/23/21 0448 12/26/21 0636  AST 39 37 35 31 40  ALT '30 28 25 18 16  '$ ALKPHOS 191* 189* 169* 129* 89  BILITOT 1.6* 1.8* 1.7* 1.1 2.1*  PROT 5.6* 5.7* 6.2* 5.8* 5.8*  ALBUMIN 2.3* 2.3* 2.8* 3.3* 4.0    No results for input(s): "LIPASE", "AMYLASE" in the last 168 hours. Recent Labs  Lab 12/23/21 0448 12/25/21 0235 12/26/21 0636  AMMONIA 37* 35 42*     Coagulation Profile: Recent Labs  Lab 12/26/21 0636  INR 1.5*     Cardiac Enzymes: No results for input(s): "CKTOTAL", "CKMB", "CKMBINDEX", "TROPONINI" in the last  168 hours.  BNP (last 3 results) No results for input(s): "PROBNP" in the last 8760 hours.  Lipid Profile: No results for input(s): "CHOL", "HDL", "LDLCALC", "TRIG", "CHOLHDL", "LDLDIRECT" in the last 72 hours.  Thyroid Function Tests: No results for input(s): "TSH", "T4TOTAL", "FREET4", "T3FREE", "THYROIDAB" in the last 72 hours.  Anemia Panel: No results for input(s): "VITAMINB12", "FOLATE", "FERRITIN", "TIBC", "IRON", "RETICCTPCT" in the last 72 hours.   Urine analysis:    Component Value Date/Time   COLORURINE YELLOW 12/21/2021 0809   APPEARANCEUR HAZY (A) 12/21/2021 0809   LABSPEC 1.027 12/21/2021 0809   PHURINE 6.0 12/21/2021 0809   GLUCOSEU 50 (A) 12/21/2021 0809   HGBUR NEGATIVE 12/21/2021 0809   BILIRUBINUR NEGATIVE 12/21/2021 0809   KETONESUR NEGATIVE 12/21/2021 0809   PROTEINUR NEGATIVE 12/21/2021 0809   UROBILINOGEN 0.2 04/11/2011 1437   NITRITE NEGATIVE 12/21/2021 0809   LEUKOCYTESUR SMALL (A) 12/21/2021 0809    Sepsis Labs: Lactic Acid, Venous    Component Value Date/Time   LATICACIDVEN 7.5 ([place]) 12/05/2021 0238    MICROBIOLOGY: Recent Results (from the past 240 hour(s))  Blood culture (routine x 2)     Status: Abnormal   Collection Time: 12/21/21  4:00 AM   Specimen: BLOOD  Result Value Ref Range Status   Specimen Description BLOOD SITE NOT SPECIFIED  Final   Special Requests   Final    BOTTLES DRAWN AEROBIC AND ANAEROBIC Blood Culture adequate volume   Culture  Setup Time   Final    GRAM POSITIVE COCCI IN CHAINS AEROBIC BOTTLE ONLY Organism ID to follow CRITICAL RESULT CALLED TO, READ BACK BY AND VERIFIED WITH: T RUDISILL,PHARMD'@0229'$  12/22/21 [place] Performed at [facility], [place] [address], [place], [place] [address]    Culture STREPTOCOCCUS ANGINOSIS (A)  Final   Report Status 12/27/2021 FINAL  Final   Organism ID, Bacteria STREPTOCOCCUS ANGINOSIS  Final      Susceptibility   Streptococcus anginosis - MIC*    PENICILLIN <=0.06       CEFTRIAXONE <=0.12 SENSITIVE Sensitive     ERYTHROMYCIN >=8 RESISTANT Resistant     LEVOFLOXACIN <=0.25 SENSITIVE Sensitive     VANCOMYCIN 0.25 SENSITIVE Sensitive     * STREPTOCOCCUS ANGINOSIS  Blood Culture ID Panel (Reflexed)     Status: Abnormal   Collection Time: 12/21/21  4:00 AM  Result Value Ref Range Status   Enterococcus faecalis NOT DETECTED NOT DETECTED Final   Enterococcus Faecium NOT DETECTED NOT DETECTED Final   Listeria monocytogenes NOT DETECTED NOT DETECTED Final   Staphylococcus species NOT DETECTED NOT DETECTED Final   Staphylococcus aureus (BCID) NOT DETECTED NOT DETECTED Final   Staphylococcus epidermidis NOT DETECTED NOT DETECTED Final   Staphylococcus lugdunensis NOT DETECTED NOT DETECTED Final   Streptococcus species DETECTED (A) NOT DETECTED Final    Comment: Not Enterococcus species, Streptococcus agalactiae, Streptococcus pyogenes, or Streptococcus pneumoniae. CRITICAL RESULT CALLED TO, READ BACK BY AND VERIFIED WITH: T RUDISILL,PHARMD'@0229'$  12/22/21 [place]    Streptococcus agalactiae NOT DETECTED NOT DETECTED Final   Streptococcus pneumoniae NOT DETECTED NOT DETECTED Final   Streptococcus pyogenes NOT DETECTED NOT DETECTED Final   A.calcoaceticus-baumannii NOT DETECTED NOT DETECTED Final   Bacteroides fragilis NOT DETECTED NOT DETECTED Final   Enterobacterales NOT DETECTED NOT DETECTED Final   Enterobacter cloacae complex NOT DETECTED NOT DETECTED Final   Escherichia coli NOT DETECTED NOT DETECTED Final   Klebsiella aerogenes NOT DETECTED NOT DETECTED Final   Klebsiella oxytoca NOT DETECTED NOT DETECTED Final   Klebsiella pneumoniae NOT DETECTED NOT DETECTED Final   Proteus species NOT DETECTED NOT DETECTED Final   Salmonella species NOT DETECTED NOT DETECTED Final   Serratia marcescens NOT DETECTED NOT DETECTED Final   Haemophilus influenzae NOT DETECTED NOT DETECTED Final   Neisseria meningitidis NOT DETECTED NOT DETECTED Final   Pseudomonas aeruginosa  NOT DETECTED NOT DETECTED Final   Stenotrophomonas maltophilia NOT DETECTED NOT DETECTED Final   Candida albicans NOT DETECTED NOT DETECTED Final   Candida auris NOT DETECTED NOT DETECTED Final   Candida glabrata NOT DETECTED NOT DETECTED Final   Candida krusei NOT DETECTED NOT DETECTED Final   Candida parapsilosis NOT DETECTED NOT DETECTED Final   Candida tropicalis NOT DETECTED NOT DETECTED Final   Cryptococcus neoformans/gattii NOT DETECTED NOT DETECTED Final    Comment: Performed at [facility], [address], [place], [place] [address]  Blood culture (routine x 2)     Status: None   Collection Time: 12/21/21  4:08 AM   Specimen: BLOOD  Result Value Ref Range Status   Specimen Description BLOOD SITE NOT SPECIFIED  Final   Special Requests   Final    BOTTLES DRAWN AEROBIC ONLY Blood Culture results may not be optimal due to an inadequate volume of blood received in culture bottles   Culture   Final    NO GROWTH 5 DAYS Performed at [facility], [place] [address], [place], [place] [address]    Report Status 12/26/2021 FINAL  Final  Culture, body fluid w Gram Stain-bottle     Status: None   Collection Time: 12/21/21  3:21 PM   Specimen: Fluid  Result Value Ref  Range Status   Specimen Description FLUID PERITONEAL  Final   Special Requests BOTTLES DRAWN AEROBIC AND ANAEROBIC  Final   Culture   Final    NO GROWTH 5 DAYS Performed at [facility], [place] [address], [place], [place] [address]    Report Status 12/26/2021 FINAL  Final  Gram stain     Status: None   Collection Time: 12/21/21  3:21 PM   Specimen: Fluid  Result Value Ref Range Status   Specimen Description FLUID PERITONEAL  Final   Special Requests NONE  Final   Gram Stain   Final    WBC PRESENT,BOTH PMN AND MONONUCLEAR NO ORGANISMS SEEN CYTOSPIN SMEAR Performed at [facility], [address], [place], [place] [address]    Report Status 12/21/2021 FINAL  Final  Culture, blood (Routine X 2) w  Reflex to ID Panel     Status: None   Collection Time: 12/22/21  6:16 PM   Specimen: BLOOD RIGHT HAND  Result Value Ref Range Status   Specimen Description BLOOD RIGHT HAND  Final   Special Requests   Final    BOTTLES DRAWN AEROBIC AND ANAEROBIC Blood Culture results may not be optimal due to an inadequate volume of blood received in culture bottles   Culture   Final    NO GROWTH 5 DAYS Performed at [facility], [place] [address], [place], [place] [address]    Report Status 12/27/2021 FINAL  Final  Culture, blood (Routine X 2) w Reflex to ID Panel     Status: None   Collection Time: 12/22/21  6:16 PM   Specimen: BLOOD  Result Value Ref Range Status   Specimen Description BLOOD SITE NOT SPECIFIED  Final   Special Requests   Final    BOTTLES DRAWN AEROBIC AND ANAEROBIC Blood Culture adequate volume   Culture   Final    NO GROWTH 5 DAYS Performed at [facility], [place] [address], [place], [place] [address]    Report Status 12/27/2021 FINAL  Final  Urine Culture     Status: None   Collection Time: 12/25/21  8:51 AM   Specimen: Urine, Catheterized  Result Value Ref Range Status   Specimen Description URINE, CATHETERIZED  Final   Special Requests NONE  Final   Culture   Final    NO GROWTH Performed at [place] [address], [place],  [address]    Report Status 12/26/2021 FINAL  Final    RADIOLOGY STUDIES/RESULTS: IR US Guide Bx Asp/Drain  Result Date: 12/26/2021 INDICATION: 41 year old female referred for medical liver biopsy EXAM: IR ULTRASOUND GUIDANCE; ULTRASOUND BIOPSY CORE LIVER MEDICATIONS: None. ANESTHESIA/SEDATION: Moderate (conscious) sedation was not employed during this procedure. A total of Versed 0.5 mg and Fentanyl 0 mcg was administered intravenously. Moderate Sedation Time: 0 minutes. The patient's level of consciousness and vital signs were monitored continuously by radiology nursing throughout the procedure under my direct supervision.  FLUOROSCOPY TIME:  Ultrasound COMPLICATIONS: None PROCEDURE: Informed written consent was obtained from the patient's family after a thorough discussion of the procedural risks, benefits and alternatives. All questions were addressed. Maximal Sterile Barrier Technique was utilized including caps, mask, sterile gowns, sterile gloves, sterile drape, hand hygiene and skin antiseptic. A timeout was performed prior to the initiation of the procedure. Ultrasound survey of the left liver lobe performed with images stored and sent to PACs. No ascites was identified. The subxiphoid region was prepped with chlorhexidine  in a sterile fashion, and a sterile drape was applied covering the operative field. A sterile gown and sterile gloves were used for the procedure. Local anesthesia was provided with 1% Lidocaine. The patient was prepped and draped sterilely and the skin and subcutaneous tissues were generously infiltrated with 1% lidocaine. A 17 gauge introducer needle was then advanced under ultrasound guidance in subxiphoid region into the left liver lobe. The stylet was removed, and multiple separate 18 gauge core biopsy were retrieved. Samples were placed into formalin for transportation to the lab. Gel-Foam pledgets were then infused with a small amount of saline for assistance with hemostasis. The needle was removed, and a final ultrasound image was performed. The patient tolerated the procedure well and remained hemodynamically stable throughout. No complications were encountered and no significant blood loss was encounter. IMPRESSION: Status post ultrasound-guided medical liver biopsy of the left liver. Signed, Dulcy Fanny. Nadene Rubins, RPVI [facility] Radiology Electronically Signed   By: Corrie Mckusick D.O.   On: 12/26/2021 14:01     LOS: 6 days   Oren Binet, MD  [facility]    To contact the attending provider between 7A-7P or the covering  provider during after hours 7P-7A, please log into the web site [email or web address] and access using universal [place] password for that web site. If you do not have the password, please call the hospital operator.  12/27/2021, 12:08 PM

## 2021-12-27 NOTE — TOC Progression Note (Addendum)
Transition of Care [facility]) - Progression Note    Patient Details  Name: Anna Garcia MRN: [id number] Date of Birth: [birth year]  Transition of Care [facility]) CM/SW [place], [place] Phone Number: 12/27/2021, 12:07 PM  Clinical Narrative:     Update-3:57pm- CSW received callback from [facility] patients sister-n-law who accepted [facility] for SNF choice for patient. CSW to follow up with [facility] closer to patient being medically ready for dc to confirm SNF bed.  CSW spoke with patients sister-n-law Anna Garcia and provided SNF bed offers. Patients sister-n-law informed CSW she is going to review SNF bed offers for patient and give CSW a callback with SNF choice. CSW will continue to follow and assist with patients dc planning needs.  Expected Discharge Plan: Skilled Nursing Facility Barriers to Discharge: Continued Medical Work up  Expected Discharge Plan and Services Expected Discharge Plan: [place] In-house Referral: Clinical Social Work Discharge Planning Services: CM Consult Post Acute Care Choice: [place] arrangements for the past 2 months: [place]                 DME Arranged: Bedside commode, Walker rolling DME Agency: [employer] Date DME Agency Contacted: 12/24/21 Time DME Agency Contacted: 34 Representative spoke with at DME Agency: [name] HH Arranged: PT, OT, RN, Disease Management, Nurse's Aide, Social Work [employer] Agency: [place] Date [place]: 12/24/21 Time [place]: 1405 Representative spoke with at [place]: [place] ([place]) Interventions    Readmission Risk Interventions    12/14/2021   10:09 AM 09/24/2021    1:14 PM  Readmission Risk Prevention Plan  Transportation Screening Complete   PCP or Specialist Appt within 3-5 Days Complete   HRI or [place] Complete Complete  Social Work Consult for [place] Planning/Counseling Complete  Complete  Palliative Care Screening Not Applicable Not Applicable  Medication Review [employer]) Complete

## 2021-12-27 NOTE — Progress Notes (Signed)
Inpatient Diabetes Program Recommendations  AACE/ADA: New Consensus Statement on Inpatient Glycemic Control (2015)  Target Ranges:  Prepandial:   less than 140 mg/dL      Peak postprandial:   less than 180 mg/dL (1-2 hours)      Critically ill patients:  140 - 180 mg/dL   Lab Results  Component Value Date   GLUCAP 291 (H) 12/27/2021   HGBA1C 7.6 (H) 11/18/2021    Review of Glycemic Control  Latest Reference Range & Units 12/26/21 21:45 12/27/21 00:56 12/27/21 07:48  Glucose-Capillary 70 - 99 mg/dL 407 (H) 487 (H) 291 (H)  (H): Data is abnormally high Inpatient Diabetes Program Recommendations:   Diabetes history: DM1 Outpatient Diabetes medications: Lantus 8 units BID, Apidra 0-8 units (correction and meal coverage) Current orders for Inpatient glycemic control: Semglee 10 units BID, Novolog 0-9 units TID with meals, Novolog 0-5 units QHS   Inpatient Diabetes Program Recommendations:     Consider ordering Novolog 3 units TID with meals for meal coverage if patient eats at least 50% of meals.    Thanks, Bronson Curb, MSN, RNC-OB Diabetes Coordinator [phone] (8a-5p)

## 2021-12-27 NOTE — Progress Notes (Signed)
Pt continues to behave in a calm and cooperative manner since removal of foley catheter. Pt able to be redirected by sitter to avoid injury. Restraints discontinued @ 0600

## 2021-12-27 NOTE — Progress Notes (Signed)
Date and time results received: 12/27/21 at 0840 (use smartphrase ".now" to insert current time)  Test: potassium Critical Value: 2.6  Name of Provider Notified: Dr Sloan Leiter at [phone]  Orders Received? Or Actions Taken?: Dr Sloan Leiter to add orders

## 2021-12-27 NOTE — Progress Notes (Signed)
Pt hallucinating in bed, petting her blanket and talking to it like it is a pet. When asking what she is petting in the bed, she states, "I really don't know". Pt also asks about flashing lights outside her door insisting it is an ambulance but there are no flashing lights. Pt intermittently points at the floor or across the room and will not verbalize what she is pointing to. At one point pt turned her head to have a conversation like someone was behind her but no one was there. Pt also intermittently paranoid and believed the NT was trying to kill her when she was attempting to check her CBG. Pt requested to check it herself and proceeded to squeeze her fingertip for blood even though her finger had not been pricked. Pt mood waxing and waning off and on - at times she is laughing and at times she is crying heavily. Other times she is agitated, anxious and aggressive, sometimes making sarcastic comments to staff. Her speech also varies from clear and appropriate to inappropriate words to garbled. She appears to be very tired but is unable to sleep.

## 2021-12-27 NOTE — Progress Notes (Signed)
Occupational Therapy Treatment Patient Details Name: Anna Garcia MRN: [id number] DOB: [birth year] Today's Date: 12/27/2021   History of present illness Pt is a 41 y/o female admitted secondary to LE swelling, RLE cellulitis, and liver cirrhosis. Liver biopsy 9/6. PMH includes DM, alcohol abuse, fibromyalgia, chronic pain syndrome, HTN, chronic urinary retention with self caths.   OT comments  Patient exiting bathroom upon arrival with patient requiring assistance due to 2 IV poles and verbal cues for safety. Patient able to stand at sink for grooming tasks with supervision and verbal cues. Balance activities performed to increase safety with tranfers with min guard assist and no LOB. Patient return to supine at end of session. Acute OT to continue to follow.    Recommendations for follow up therapy are one component of a multi-disciplinary discharge planning process, led by the attending physician.  Recommendations may be updated based on patient status, additional functional criteria and insurance authorization.    Follow Up Recommendations  No OT follow up    Assistance Recommended at Discharge Set up Supervision/Assistance  Patient can return home with the following  A little help with walking and/or transfers;A little help with bathing/dressing/bathroom;Assistance with cooking/housework;Assist for transportation   Equipment Recommendations  BSC/3in1    Recommendations for Other Services      Precautions / Restrictions Precautions Precautions: Fall Precaution Comments: Pt continues to feel "foggy=" Restrictions Weight Bearing Restrictions: No Other Position/Activity Restrictions: weeping edema       Mobility Bed Mobility Overal bed mobility: Needs Assistance Bed Mobility: Sit to Supine       Sit to supine: Supervision   General bed mobility comments: patient able to return to supine without assistance    Transfers Overall transfer level: Needs assistance Equipment  used: Rolling walker (2 wheels) Transfers: Sit to/from Stand, Bed to chair/wheelchair/BSC Sit to Stand: Min guard           General transfer comment: min assist for transfers due to 2 IV poles and lines     Balance Overall balance assessment: Needs assistance Sitting-balance support: No upper extremity supported, Feet supported Sitting balance-Leahy Scale: Fair     Standing balance support: Single extremity supported, Bilateral upper extremity supported Standing balance-Leahy Scale: Poor Standing balance comment: min guard assist                           ADL either performed or assessed with clinical judgement   ADL Overall ADL's : Needs assistance/impaired     Grooming: Wash/dry hands;Wash/dry face;Oral care;Brushing hair;Supervision/safety;Standing Grooming Details (indicate cue type and reason): at sink                 Toilet Transfer: Ambulation;Regular Toilet;Minimal Print production planner Details (indicate cue type and reason): requires assistance due to 2 IV poles and lines           General ADL Comments: leaving bathroom upon entry with verbal cues for safety and assistance with lines    Extremity/Trunk Assessment              Vision       Perception     Praxis      Cognition Arousal/Alertness: Awake/alert Behavior During Therapy: Impulsive Overall Cognitive Status: Impaired/Different from baseline Area of Impairment: Orientation, Attention, Memory, Following commands, Safety/judgement, Awareness, Problem solving                 Orientation Level: Disoriented to, Time, Place, Situation Current Attention Level:  Focused Memory: Decreased recall of precautions, Decreased short-term memory Following Commands: Follows one step commands with increased time Safety/Judgement: Decreased awareness of safety, Decreased awareness of deficits Awareness: Intellectual Problem Solving: Slow processing, Decreased initiation,  Requires verbal cues, Requires tactile cues          Exercises      Shoulder Instructions       General Comments balance activities performed while standing beside bed to address bathroom transfers without an assistive device    Pertinent Vitals/ Pain       Pain Assessment Pain Assessment: Faces Faces Pain Scale: Hurts little more Pain Location: generalized Pain Descriptors / Indicators: Grimacing, Guarding Pain Intervention(s): Limited activity within patient's tolerance, Monitored during session, Repositioned  Home Living                                          Prior Functioning/Environment              Frequency  Min 2X/week        Progress Toward Goals  OT Goals(current goals can now be found in the care plan section)  Progress towards OT goals: Progressing toward goals  Acute Rehab OT Goals Patient Stated Goal: get better OT Goal Formulation: With patient Time For Goal Achievement: 01/04/22 Potential to Achieve Goals: Good ADL Goals Pt Will Perform Grooming: with set-up;standing Pt Will Perform Lower Body Bathing: with modified independence;sit to/from stand Pt Will Perform Lower Body Dressing: with min assist;sit to/from stand;with adaptive equipment Pt Will Transfer to Toilet: with modified independence;ambulating;regular height toilet Pt Will Perform Tub/Shower Transfer: Tub transfer;ambulating;grab bars;rolling walker Additional ADL Goal #1: Pt will walk to bathroom without walker and complete all toileting tasks with no assist.  Plan Discharge plan remains appropriate    Co-evaluation                 AM-PAC OT "6 Clicks" Daily Activity     Outcome Measure   Help from another person eating meals?: None Help from another person taking care of personal grooming?: None Help from another person toileting, which includes using toliet, bedpan, or urinal?: A Little Help from another person bathing (including washing,  rinsing, drying)?: A Lot Help from another person to put on and taking off regular upper body clothing?: None Help from another person to put on and taking off regular lower body clothing?: A Lot 6 Click Score: 19    End of Session Equipment Utilized During Treatment: Rolling walker (2 wheels)  OT Visit Diagnosis: Unsteadiness on feet (R26.81);History of falling (Z91.81);Pain   Activity Tolerance Patient tolerated treatment well   Patient Left in bed;with call bell/phone within reach;with nursing/sitter in room   Nurse Communication Mobility status        Time: 3810-1751 OT Time Calculation (min): 28 min  Charges: OT General Charges $OT Visit: 1 Visit OT Treatments $Self Care/Home Management : 8-22 mins $Therapeutic Activity: 8-22 mins  Lodema Hong, OTA Acute Rehabilitation Services  Office [place] 12/27/2021, 1:47 PM

## 2021-12-27 NOTE — Plan of Care (Signed)
  Problem: Education: Goal: Ability to describe self-care measures that may prevent or decrease complications (Diabetes Survival Skills Education) will improve Outcome: Progressing Goal: Individualized Educational Video(s) Outcome: Progressing   Problem: Coping: Goal: Ability to adjust to condition or change in health will improve Outcome: Progressing   Problem: Fluid Volume: Goal: Ability to maintain a balanced intake and output will improve Outcome: Progressing   Problem: Health Behavior/Discharge Planning: Goal: Ability to identify and utilize available resources and services will improve Outcome: Progressing Goal: Ability to manage health-related needs will improve Outcome: Progressing   Problem: Metabolic: Goal: Ability to maintain appropriate glucose levels will improve Outcome: Progressing   Problem: Nutritional: Goal: Maintenance of adequate nutrition will improve Outcome: Progressing Goal: Progress toward achieving an optimal weight will improve Outcome: Progressing   Problem: Skin Integrity: Goal: Risk for impaired skin integrity will decrease Outcome: Progressing   Problem: Tissue Perfusion: Goal: Adequacy of tissue perfusion will improve Outcome: Progressing   Problem: Safety: Goal: Non-violent Restraint(s) Outcome: Progressing   Problem: Education: Goal: Knowledge of General Education information will improve Description: Including pain rating scale, medication(s)/side effects and non-pharmacologic comfort measures Outcome: Progressing   Problem: Health Behavior/Discharge Planning: Goal: Ability to manage health-related needs will improve Outcome: Progressing   Problem: Clinical Measurements: Goal: Ability to maintain clinical measurements within normal limits will improve Outcome: Progressing Goal: Will remain free from infection Outcome: Progressing Goal: Diagnostic test results will improve Outcome: Progressing Goal: Respiratory complications  will improve Outcome: Progressing Goal: Cardiovascular complication will be avoided Outcome: Progressing   Problem: Activity: Goal: Risk for activity intolerance will decrease Outcome: Progressing   Problem: Nutrition: Goal: Adequate nutrition will be maintained Outcome: Progressing   Problem: Coping: Goal: Level of anxiety will decrease Outcome: Progressing   Problem: Elimination: Goal: Will not experience complications related to bowel motility Outcome: Progressing Goal: Will not experience complications related to urinary retention Outcome: Progressing   Problem: Pain Managment: Goal: General experience of comfort will improve Outcome: Progressing   Problem: Safety: Goal: Ability to remain free from injury will improve Outcome: Progressing   Problem: Skin Integrity: Goal: Risk for impaired skin integrity will decrease Outcome: Progressing   

## 2021-12-27 NOTE — Progress Notes (Addendum)
Progress Note   Subjective  LOS: 6 days  Chief Complaint: Cirrhosis, AMS  Today, patient is seen just after walking back from the bathroom, she is constantly crying.  Per nursing staff she has expressed that her teeth/mouth are hurting and also the ulcers on her feet to which they are applying clean bandages.  Patient just had a loose stool and urinated.  Does tell me that it hurts all over her abdomen when asked.  Does not really stop crying to answer any of my other questions.   Objective   Vital signs in last 24 hours: Temp:  [97.7 F (36.5 C)-99.2 F (37.3 C)] 99.2 F (37.3 C) (09/07 0749) Pulse Rate:  [89-100] 96 (09/07 0500) Resp:  [16-20] 20 (09/07 0500) BP: (91-127)/(58-69) 105/60 (09/07 0500) SpO2:  [95 %-99 %] 98 % (09/07 0500) Weight:  [76.8 kg] 76.8 kg (09/07 0500) Last BM Date : 12/26/21 General:    white female in mild distress, crying inconsolably Heart:  Regular rate and rhythm; no murmurs Lungs: Respirations even and unlabored, lungs CTA bilaterally Abdomen:  Soft, mild generalized ttp and nondistended. Normal bowel sounds. Extremities:  b/l LE edema Neurologic:  Alert , Crying Psych:  Inconsolable, unable to answer questions  Intake/Output from previous day: 09/06 0701 - 09/07 0700 In: 800 [I.V.:500; IV Piggyback:300.1] Out: 2306 [Urine:2300; Stool:6] Intake/Output this shift: Total I/O In: 540 [P.O.:540] Out: -   Lab Results: Recent Labs    12/24/21 1700 12/25/21 0235 12/26/21 0636  WBC  --  7.3 8.5  HGB 8.5* 9.3* 8.2*  HCT 26.5* 28.4* 25.7*  PLT  --  159 126*   BMET Recent Labs    12/25/21 0235 12/26/21 0636 12/27/21 0724  NA 147* 144 141  K 3.6 3.2* 2.6*  CL 109 109 106  CO2 '25 23 22  '$ GLUCOSE 95 176* 319*  BUN <5* <5* 7  CREATININE 1.19* 1.22* 1.27*  CALCIUM 9.8 9.4 8.8*   LFT Recent Labs    12/26/21 0636  PROT 5.8*  ALBUMIN 4.0  AST 40  ALT 16  ALKPHOS 89  BILITOT 2.1*   PT/INR Recent Labs    12/26/21 0636   LABPROT 17.9*  INR 1.5*    Studies/Results: IR US Guide Bx Asp/Drain  Result Date: 12/26/2021 INDICATION: 41 year old female referred for medical liver biopsy EXAM: IR ULTRASOUND GUIDANCE; ULTRASOUND BIOPSY CORE LIVER MEDICATIONS: None. ANESTHESIA/SEDATION: Moderate (conscious) sedation was not employed during this procedure. A total of Versed 0.5 mg and Fentanyl 0 mcg was administered intravenously. Moderate Sedation Time: 0 minutes. The patient's level of consciousness and vital signs were monitored continuously by radiology nursing throughout the procedure under my direct supervision. FLUOROSCOPY TIME:  Ultrasound COMPLICATIONS: None PROCEDURE: Informed written consent was obtained from the patient's family after a thorough discussion of the procedural risks, benefits and alternatives. All questions were addressed. Maximal Sterile Barrier Technique was utilized including caps, mask, sterile gowns, sterile gloves, sterile drape, hand hygiene and skin antiseptic. A timeout was performed prior to the initiation of the procedure. Ultrasound survey of the left liver lobe performed with images stored and sent to PACs. No ascites was identified. The subxiphoid region was prepped with chlorhexidine in a sterile fashion, and a sterile drape was applied covering the operative field. A sterile gown and sterile gloves were used for the procedure. Local anesthesia was provided with 1% Lidocaine. The patient was prepped and draped sterilely and the skin and subcutaneous tissues were generously infiltrated with 1% lidocaine.  A 17 gauge introducer needle was then advanced under ultrasound guidance in subxiphoid region into the left liver lobe. The stylet was removed, and multiple separate 18 gauge core biopsy were retrieved. Samples were placed into formalin for transportation to the lab. Gel-Foam pledgets were then infused with a small amount of saline for assistance with hemostasis. The needle was removed, and a final  ultrasound image was performed. The patient tolerated the procedure well and remained hemodynamically stable throughout. No complications were encountered and no significant blood loss was encounter. IMPRESSION: Status post ultrasound-guided medical liver biopsy of the left liver. Signed, Dulcy Fanny. Nadene Rubins, RPVI [facility] Rad[facility] Electronically Signed   By: Corrie Mckusick D.O.   On: 12/26/2021 14:01      Assessment / Plan:   Assessment: 1.  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  Encephalopathy: Question Hg versus alcohol withdrawal with Wilson's disease, continues on lactulose with loose stools 3 times a day per nursing staff, EEG with changes of diffuse encephalopathy of unclear etiology, no seizure activity, neurology following, does seem some better today 3.  Aspiration, hypoxia: In the setting of altered mental status, Flagyl added to Rocephin 4.  Streptococcal bacteremia: Day 7/7 of Rocephin, lower extremity redness/cellulitis could be etiology 5.  Diabetes type 1 6.  Gastroparesis: Chronic nausea/vomiting, on Metoclopramide 10 mg p.o. 3 times daily and Famotidine 40 mg twice daily 7.  AKI: Improved 8.  Superficial thrombosis of left cephalic vein  Plan: 1.  Serum copper levels pending 2.  Liver biopsy completed 12/26/2021 results pending 3.  Continue current medications including Lactulose  Thank you for  your kind consultation, we will continue to follow along.   LOS: 6 days   Levin Erp  12/27/2021, 12:05 PM     Attending physician's note    I have taken a history, reviewed the chart and examined the patient. I performed a substantive portion of this encounter, including complete performance of at least one of the key components, in conjunction with the APP. I agree with the APP's note, impression and recommendations.   Decompensated cirrhosis with ascites and SBP MELD 3.0: 17 at 12/27/2021  7:24 AM MELD-Na: 16 at 12/27/2021  7:24 AM     Liver biopsy consistent with extensive steatosis, micro and macronodular fibrosis and cirrhosis Copper stains pending, no definite diagnosis of Wilson's  Urine copper less than 40  On antibiotics for cellulitis Continue lactulose for encephalopathy, unclear additional etiologies for worsening mental status.  Neurology is following  Continue supportive care  GI will continue to follow along    K. Denzil Magnuson , MD [phone]

## 2021-12-28 DIAGNOSIS — G9340 Encephalopathy, unspecified: Secondary | ICD-10-CM | POA: Diagnosis not present

## 2021-12-28 DIAGNOSIS — K7460 Unspecified cirrhosis of liver: Secondary | ICD-10-CM | POA: Diagnosis not present

## 2021-12-28 DIAGNOSIS — R601 Generalized edema: Secondary | ICD-10-CM | POA: Diagnosis not present

## 2021-12-28 DIAGNOSIS — A419 Sepsis, unspecified organism: Secondary | ICD-10-CM | POA: Diagnosis not present

## 2021-12-28 LAB — BASIC METABOLIC PANEL
Anion gap: 13 (ref 5–15)
BUN: 7 mg/dL (ref 6–20)
CO2: 21 mmol/L — ABNORMAL LOW (ref 22–32)
Calcium: 9.1 mg/dL (ref 8.9–10.3)
Chloride: 107 mmol/L (ref 98–111)
Creatinine, Ser: 1.19 mg/dL — ABNORMAL HIGH (ref 0.44–1.00)
GFR, Estimated: 59 mL/min — ABNORMAL LOW (ref >60)
Glucose, Bld: 301 mg/dL — ABNORMAL HIGH (ref 70–99)
Potassium: 2.9 mmol/L — ABNORMAL LOW (ref 3.5–5.1)
Sodium: 141 mmol/L (ref 135–145)

## 2021-12-28 LAB — GLUCOSE, CAPILLARY
Glucose-Capillary: 262 mg/dL — ABNORMAL HIGH (ref 70–99)
Glucose-Capillary: 267 mg/dL — ABNORMAL HIGH (ref 70–99)
Glucose-Capillary: 429 mg/dL — ABNORMAL HIGH (ref 70–99)
Glucose-Capillary: 441 mg/dL — ABNORMAL HIGH (ref 70–99)
Glucose-Capillary: 310 mg/dL — ABNORMAL HIGH (ref 70–99)

## 2021-12-28 LAB — CBC
HCT: 23.8 % — ABNORMAL LOW (ref 36.0–46.0)
Hemoglobin: 7.6 g/dL — ABNORMAL LOW (ref 12.0–15.0)
MCH: 33.5 pg (ref 26.0–34.0)
MCHC: 31.9 g/dL (ref 30.0–36.0)
MCV: 104.8 fL — ABNORMAL HIGH (ref 80.0–100.0)
Platelets: 117 10*3/uL — ABNORMAL LOW (ref 150–400)
RBC: 2.27 MIL/uL — ABNORMAL LOW (ref 3.87–5.11)
RDW: 22.4 % — ABNORMAL HIGH (ref 11.5–15.5)
WBC: 4.4 10*3/uL (ref 4.0–10.5)
nRBC: 0 % (ref 0.0–0.2)

## 2021-12-28 LAB — VITAMIN B1: Vitamin B1 (Thiamine): 294 nmol/L — ABNORMAL HIGH (ref 66.5–200.0)

## 2021-12-28 LAB — MAGNESIUM: Magnesium: 2.1 mg/dL (ref 1.7–2.4)

## 2021-12-28 MED ORDER — POTASSIUM CHLORIDE 10 MEQ/100ML IV SOLN
10.0000 meq | INTRAVENOUS | Status: AC
  Administered 2021-12-28 (×2): 10 meq via INTRAVENOUS
  Filled 2021-12-28 (×2): qty 100

## 2021-12-28 MED ORDER — INSULIN ASPART 100 UNIT/ML IJ SOLN
10.0000 [IU] | Freq: Once | INTRAMUSCULAR | Status: AC
  Administered 2021-12-28: 10 [IU] via SUBCUTANEOUS

## 2021-12-28 MED ORDER — ALUM & MAG HYDROXIDE-SIMETH 200-200-20 MG/5ML PO SUSP
30.0000 mL | ORAL | Status: DC | PRN
Start: 2021-12-28 — End: 2022-01-04
  Administered 2021-12-28: 30 mL via ORAL
  Filled 2021-12-28: qty 30

## 2021-12-28 MED ORDER — POTASSIUM CHLORIDE CRYS ER 20 MEQ PO TBCR
40.0000 meq | EXTENDED_RELEASE_TABLET | Freq: Four times a day (QID) | ORAL | Status: AC
  Administered 2021-12-28 (×2): 40 meq via ORAL
  Filled 2021-12-28 (×2): qty 2

## 2021-12-28 MED ORDER — INSULIN GLARGINE-YFGN 100 UNIT/ML ~~LOC~~ SOLN
14.0000 [IU] | Freq: Two times a day (BID) | SUBCUTANEOUS | Status: DC
  Administered 2021-12-29 – 2021-12-30 (×5): 14 [IU] via SUBCUTANEOUS
  Filled 2021-12-28 (×6): qty 0.14

## 2021-12-28 NOTE — Progress Notes (Signed)
Physical Therapy Treatment Patient Details Name: Anna Garcia MRN: [id number] DOB: 1980/09/15 Today's Date: 12/28/2021   History of Present Illness Pt is a 41 y/o female admitted secondary to LE swelling, RLE cellulitis, and liver cirrhosis. Liver biopsy 9/6. PMH includes DM, alcohol abuse, fibromyalgia, chronic pain syndrome, HTN, chronic urinary retention with self caths.    PT Comments    Pt tolerates treatment well, demonstrating much improved cognition and mobility. Pt is able to ambulate without and assistive device or physical assistance, and participates in higher level standing balance activities with PT. Pt is conversing well with PT and demonstrates some improvement in awareness of her deficits, although memory remains impaired as the pt states she had seen this PT's student observer whom she had not met before. Pt will benefit from continued therapies in an effort to restore independence in gait and mobility.   Recommendations for follow up therapy are one component of a multi-disciplinary discharge planning process, led by the attending physician.  Recommendations may be updated based on patient status, additional functional criteria and insurance authorization.  Follow Up Recommendations  Home health PT     Assistance Recommended at Discharge Intermittent Supervision/Assistance  Patient can return home with the following A little help with walking and/or transfers;A little help with bathing/dressing/bathroom;Assistance with cooking/housework;Direct supervision/assist for medications management;Direct supervision/assist for financial management;Assist for transportation;Help with stairs or ramp for entrance   Equipment Recommendations  None recommended by PT    Recommendations for Other Services       Precautions / Restrictions Precautions Precautions: Fall Restrictions Weight Bearing Restrictions: No     Mobility  Bed Mobility                     Transfers Overall transfer level: Needs assistance Equipment used: None Transfers: Sit to/from Stand Sit to Stand: Min guard                Ambulation/Gait Ambulation/Gait assistance: Min guard, Supervision Gait Distance (Feet): 300 Feet Assistive device: None Gait Pattern/deviations: Step-through pattern Gait velocity: reduced Gait velocity interpretation: <1.8 ft/sec, indicate of risk for recurrent falls   General Gait Details: pt with steady step-through gait, minG initially improving to supervision   Stairs             Wheelchair Mobility    Modified Rankin (Stroke Patients Only)       Balance Overall balance assessment: Needs assistance Sitting-balance support: No upper extremity supported, Feet supported Sitting balance-Leahy Scale: Good     Standing balance support: No upper extremity supported, During functional activity Standing balance-Leahy Scale: Good   Single Leg Stance - Right Leg: 4 Single Leg Stance - Left Leg: 4 Tandem Stance - Right Leg: 8 Tandem Stance - Left Leg: 10 Rhomberg - Eyes Opened: 20 Rhomberg - Eyes Closed: 20                Cognition Arousal/Alertness: Awake/alert Behavior During Therapy: WFL for tasks assessed/performed Overall Cognitive Status: Impaired/Different from baseline Area of Impairment: Problem solving                             Problem Solving: Slow processing          Exercises      General Comments General comments (skin integrity, edema, etc.): VSS on RA      Pertinent Vitals/Pain Pain Assessment Pain Assessment: Faces Faces Pain Scale: Hurts even more Pain Location:  feet Pain Descriptors / Indicators: Sore Pain Intervention(s): Monitored during session    Home Living                          Prior Function            PT Goals (current goals can now be found in the care plan section) Acute Rehab PT Goals Patient Stated Goal: to continue getting  better Progress towards PT goals: Progressing toward goals    Frequency    Min 3X/week      PT Plan Discharge plan needs to be updated    Co-evaluation              AM-PAC PT "6 Clicks" Mobility   Outcome Measure  Help needed turning from your back to your side while in a flat bed without using bedrails?: A Little Help needed moving from lying on your back to sitting on the side of a flat bed without using bedrails?: A Little Help needed moving to and from a bed to a chair (including a wheelchair)?: A Little Help needed standing up from a chair using your arms (e.g., wheelchair or bedside chair)?: A Little Help needed to walk in hospital room?: A Little Help needed climbing 3-5 steps with a railing? : Total 6 Click Score: 16    End of Session   Activity Tolerance: Patient tolerated treatment well Patient left: in bed;with call bell/phone within reach;with nursing/sitter in room Nurse Communication: Mobility status PT Visit Diagnosis: History of falling (Z91.81);Difficulty in walking, not elsewhere classified (R26.2);Pain Pain - part of body: Leg     Time: 1343-1400 PT Time Calculation (min) (ACUTE ONLY): 17 min  Charges:  $Gait Training: 8-22 mins                     Zenaida Niece, PT, DPT Acute Rehabilitation Office [phone]    Zenaida Niece 12/28/2021, 4:30 PM

## 2021-12-28 NOTE — Progress Notes (Addendum)
PROGRESS NOTE        PATIENT DETAILS Name: Anna Garcia Age: 41 y.o. Sex: female Date of Birth: 1980/06/04 Admit Date: 12/20/2021 Admitting Physician Evalee Mutton Kristeen Mans, MD PCP:Day, [name], MD  Brief Summary: Patient is a 41 y.o.  female with history of DM-1, gastroparesis, chronic pain syndrome/fibromyalgia, HTN, chronic urinary retention-self caths at home-who was just discharged from this facility on 8/29 after being treated for DKA/AKI/volume overload-presented back to the hospital with anasarca-3+ pitting edema in the lower extremity up to the thighs.  She was found to have SBP in Streptococcus bacteremia.  Further hospital course was complicated by development of encephalopathy and acute hypoxic respiratory failure likely due to aspiration pneumonia.  See below for further details.  Significant events: 8/22-8/29>> hospitalization for DKA/AKI-found to have liver cirrhosis 8/31>> admit for anasarca/significant lower extremity edema. 9/01>> IV albumin started-midodrine dosage escalated. 9/03>> hallucinating-confused. 9/05>> hypoxic after Ativan given for MRI brain-started on Ventimask.  Transfer to progressive care-subsequently titrated down to 3-4 L.  Significant studies: 2/23>>Hbs Ag, HCV TS:[id number] 8/24>> bilateral lower extremity Doppler: No DVT. 8/28>>ANA/RA/Anti mitrochondrial Ab/Anti smooth muscle ES:[id number] 8/26>> Echo: EF-60-65%. 8/31>> CXR: No PNA 8/31>> CT chest/abdomen/pelvis: No PE, liver cirrhosis, prominent small bowel wall thickening, moderate volume ascites.  Generalized subcutaneous edema. 9/01>> UA: No proteinuria. 9/02>> 24 urine copper: 21 9/3>> MRI brain: Limited images-but no acute CVA. 9/3>> EEG: No seizures. 9/3>> NH 4: 37 9/3>> vitamin B12: 1027. 9/5>> vitamin B1: Pending  Significant microbiology data: 9/01>>1/2 blood culture: Streptococcus anginosus 9/01>>ascites fluid culture: No growth 9/02>> blood culture: No  growth.  Procedures: 9/01>> diagnostic paracentesis-1 L (WBC 590 w 85% neutrophils) 9/06>> liver biopsy by IR.  Consults: GI, ID  Subjective: Less confused than past few days-more awake-crying very easily at times (has been doing that for the past few days)  Objective: Vitals: Blood pressure (!) 89/53, pulse 86, temperature 98 F (36.7 C), temperature source Oral, resp. rate 19, height '5\' 4"'$  (1.626 m), weight 75.6 kg, SpO2 98 %.   Exam: Gen Exam:Alert awake-not in any distress HEENT:atraumatic, normocephalic Chest: B/L clear to auscultation anteriorly CVS:S1S2 regular Abdomen:soft non tender, non distended Extremities:++ edema-some minimal erythema in bilateral lower extremity persists. Neurology: Non focal Skin: no rash   Pertinent Labs/Radiology:    Latest Ref Rng & Units 12/28/2021    6:57 AM 12/26/2021    6:36 AM 12/25/2021    2:35 AM  CBC  WBC 4.0 - 10.5 K/uL 4.4  8.5  7.3   Hemoglobin 12.0 - 15.0 g/dL 7.6  8.2  9.3   Hematocrit 36.0 - 46.0 % 23.8  25.7  28.4   Platelets 150 - 400 K/uL 117  126  159     Lab Results  Component Value Date   NA 141 12/28/2021   K 2.9 (L) 12/28/2021   CL 107 12/28/2021   CO2 21 (L) 12/28/2021      Assessment/Plan: Anasarca due to decompensated liver cirrhosis: Volume status markedly improved-weight down to 166.7 pounds (189.6 pounds on admit), -14 liters so far.  Although improved-still has excess volume on board-continue Lasix/Aldactone.  Acute hypoxic respiratory failure likely due to aspiration pneumonia: Significantly better-on room air-last day of Rocephin today-we will be on Flagyl for a few more days.  Avoid benzodiazepines.   Acute metabolic encephalopathy: Likely due to delirium-work-up as above-significantly better over the  past few days but still not at baseline. Although has hx of some ETOH use-not felt to have etoh withdrawal as she was readmitted within 1-2 days of discharge. No further recommendations from neurology-Dr.  Bhagat-recommends continuing supportive care.  Continue Seroquel/melatonin.  Streptococcus bacteremia/spontaneous bacterial peritonitis: Afebrile-clinically improved-last day of Rocephin 9/8-following which plans are to transition to prophylactic Bactrim therapy on 9/9.  ID following.  Remains on midodrine for soft blood pressure-was on IV albumin for several days during the earlier part of the hospital stay.  Cellulitis right foot: Improved-continue IV antibiotics.  AKI: Likely mild hemodynamically mediated kidney injury-cautiously continue with diuretics as she remains volume overloaded.    Hypokalemia: Replete and recheck.  Liver cirrhosis: Diagnosed recently on imaging studies-hepatitis serology/autoimmune work-up negative.  Recent EGD June 2023 did not show any obvious varices.  Suspicion for NASH or Wilson's disease (low ceruloplasmin levels but 24 urine copper not elevated).  Spoke with ophthalmologist-Dr. Patel-9/5-almost impossible to see KF rings without a slit-lamp exam-hence ophthalmology referral will need to be done in the outpatient setting.  Spoke with sister-in-law-[name]-no family history of liver issues that she is aware of.  Underwent liver biopsy on 9/6-awaiting copper stains.  Macrocytic anemia: S/p 1 unit of PRBC on 9/4-no evidence of hemolysis (LDH normal)-no evidence of blood loss.  Suspect this could be related to acute illness.  Continue to follow CBC closely.  Recent B12 level stable-folate slightly low-on supplementation.  History of esophageal stricture: S/p dilatation June 2023.  Denies any vomiting or dysphagia symptoms.  History of diabetic gastroparesis: No vomiting-appears stable-continue Reglan.  Encourage small portion meals.  DM-1 (A1c 7.6 on 7/30): No further hypoglycemic episodes-CBGs now elevated as her diet has improved since encephalopathy is better.  Increase Semglee to 14 units twice daily-continue SSI-allow some amount of permissive hyperglycemia.     Recent Labs    12/27/21 2111 12/28/21 0750 12/28/21 1205  GLUCAP 337* 262* 267*     HLD: Continue statin  Moderate persistent asthma: Not in flare-continue bronchodilators.  History of chronic urinary retention-s/p self-catheterization at home: Due to worsening encephalopathy-significant urinary retention-indwelling Foley catheter placed on 9/4-however this was pulled out by the patient a couple of nights ago.  Since mental status has improved-continue frequent bladder scans and in/out catheterization accordingly.   Chronic pain/fibromyalgia/neuropathy: Continue Neurontin.  Obesity Estimated body mass index is 28.61 kg/m as calculated from the following:   Height as of this encounter: '5\' 4"'$  (1.626 m).   Weight as of this encounter: 75.6 kg.   Code status:   Code Status: Full Code   DVT Prophylaxis: enoxaparin (LOVENOX) injection 40 mg Start: 12/21/21 1000   Family Communication: Sister-in-law-[name]-[phone]-updated over the phone on 9/7.   Disposition Plan: Status is: Observation The patient will require care spanning > 2 midnights and should be moved to inpatient because: Improving anasarca-remains on IV antibiotics-resolving encephalopathy-not yet stable for discharge.   Planned Discharge Destination: SNF early next week.   Diet: Diet Order             Diet heart healthy/carb modified Room service appropriate? No; Fluid consistency: Thin  Diet effective now                     Antimicrobial agents: Anti-infectives (From admission, onward)    Start     Dose/Rate Route Frequency Ordered Stop   12/29/21 1000  sulfamethoxazole-trimethoprim (BACTRIM DS) 800-160 MG per tablet 1 tablet        1 tablet Oral  Daily 12/26/21 1138     12/25/21 2200  metroNIDAZOLE (FLAGYL) tablet 500 mg  Status:  Discontinued        500 mg Oral Every 12 hours 12/25/21 1416 12/25/21 1421   12/25/21 2200  metroNIDAZOLE (FLAGYL) IVPB 500 mg        500 mg 100 mL/hr over 60  Minutes Intravenous Every 12 hours 12/25/21 1421 12/28/21 2359   12/25/21 1030  metroNIDAZOLE (FLAGYL) IVPB 500 mg  Status:  Discontinued        500 mg 100 mL/hr over 60 Minutes Intravenous Every 12 hours 12/25/21 0935 12/25/21 1416   12/22/21 0800  cefTRIAXone (ROCEPHIN) 2 g in sodium chloride 0.9 % 100 mL IVPB        2 g 200 mL/hr over 30 Minutes Intravenous Every 24 hours 12/22/21 0236 12/28/21 1008   12/21/21 2100  cefTRIAXone (ROCEPHIN) 1 g in sodium chloride 0.9 % 100 mL IVPB  Status:  Discontinued        1 g 200 mL/hr over 30 Minutes Intravenous Every 24 hours 12/21/21 0055 12/22/21 0236   12/21/21 0030  cefTRIAXone (ROCEPHIN) 1 g in sodium chloride 0.9 % 100 mL IVPB  Status:  Discontinued        1 g 200 mL/hr over 30 Minutes Intravenous  Once 12/21/21 0015 12/21/21 0055   12/20/21 2145  cefTRIAXone (ROCEPHIN) 1 g in sodium chloride 0.9 % 100 mL IVPB  Status:  Discontinued        1 g 200 mL/hr over 30 Minutes Intravenous  Once 12/20/21 2139 12/20/21 2214   12/20/21 0000  cephALEXin (KEFLEX) 500 MG capsule  Status:  Discontinued        500 mg Oral 4 times daily 12/20/21 2319 12/21/21         MEDICATIONS: Scheduled Meds:  Chlorhexidine Gluconate Cloth  6 each Topical Q0600   dicyclomine  10 mg Oral TID AC & HS   enoxaparin (LOVENOX) injection  40 mg Subcutaneous Q24H   famotidine  40 mg Oral BID   feeding supplement  237 mL Oral BID BM   folic acid  2 mg Oral Daily   furosemide  40 mg Oral Daily   gabapentin  300 mg Oral BID   insulin aspart  0-5 Units Subcutaneous QHS   insulin aspart  0-6 Units Subcutaneous TID WC   insulin glargine-yfgn  10 Units Subcutaneous BID   lactulose  10 g Oral BID   lipase/protease/amylase  36,000 Units Oral TID WC   melatonin  5 mg Oral QHS   metoCLOPramide  10 mg Oral TID AC   midodrine  10 mg Oral TID WC   mometasone-formoterol  2 puff Inhalation BID   montelukast  10 mg Oral QHS   multivitamin with minerals  1 tablet Oral Daily    potassium chloride  40 mEq Oral Q6H   QUEtiapine  50 mg Oral QHS   rosuvastatin  20 mg Oral QHS   sodium chloride flush  3 mL Intravenous Q12H   spironolactone  100 mg Oral Daily   [START ON 12/29/2021] sulfamethoxazole-trimethoprim  1 tablet Oral Daily   triamcinolone   Topical BID   Continuous Infusions:  metronidazole 500 mg (12/28/21 1115)   potassium chloride     PRN Meds:.acetaminophen, albuterol, influenza vac split quadrivalent PF, loperamide, naLOXone (NARCAN)  injection, mouth rinse   I have personally reviewed following labs and imaging studies  LABORATORY DATA: CBC: Recent Labs  Lab 12/23/21 0448 12/24/21 0258  12/24/21 1700 12/25/21 0235 12/26/21 0636 12/28/21 0657  WBC 6.6 5.1  --  7.3 8.5 4.4  HGB 7.2* 6.8* 8.5* 9.3* 8.2* 7.6*  HCT 21.8* 21.2* 26.5* 28.4* 25.7* 23.8*  MCV 105.8* 109.3*  --  101.8* 103.6* 104.8*  PLT 157 147*  --  159 126* 117*     Basic Metabolic Panel: Recent Labs  Lab 12/23/21 0448 12/24/21 0258 12/25/21 0235 12/26/21 0636 12/27/21 0724 12/28/21 0657  NA 140 145 147* 144 141 141  K 3.0* 2.9* 3.6 3.2* 2.6* 2.9*  CL 106 109 109 109 106 107  CO2 '23 25 25 23 22 '$ 21*  GLUCOSE 112* 79 95 176* 319* 301*  BUN 7 6 <5* <5* 7 7  CREATININE 1.33* 1.33* 1.19* 1.22* 1.27* 1.19*  CALCIUM 9.1 9.6 9.8 9.4 8.8* 9.1  MG 1.9  --  2.3  --  2.0 2.1  PHOS  --   --  3.3  --   --   --      GFR: Estimated Creatinine Clearance: 62.6 mL/min (A) (by C-G formula based on SCr of 1.19 mg/dL (H)).  Liver Function Tests: Recent Labs  Lab 12/22/21 0513 12/23/21 0448 12/26/21 0636  AST 35 31 40  ALT '25 18 16  '$ ALKPHOS 169* 129* 89  BILITOT 1.7* 1.1 2.1*  PROT 6.2* 5.8* 5.8*  ALBUMIN 2.8* 3.3* 4.0    No results for input(s): "LIPASE", "AMYLASE" in the last 168 hours. Recent Labs  Lab 12/23/21 0448 12/25/21 0235 12/26/21 0636  AMMONIA 37* 35 42*     Coagulation Profile: Recent Labs  Lab 12/26/21 0636  INR 1.5*     Cardiac Enzymes: No  results for input(s): "CKTOTAL", "CKMB", "CKMBINDEX", "TROPONINI" in the last 168 hours.  BNP (last 3 results) No results for input(s): "PROBNP" in the last 8760 hours.  Lipid Profile: No results for input(s): "CHOL", "HDL", "LDLCALC", "TRIG", "CHOLHDL", "LDLDIRECT" in the last 72 hours.  Thyroid Function Tests: No results for input(s): "TSH", "T4TOTAL", "FREET4", "T3FREE", "THYROIDAB" in the last 72 hours.  Anemia Panel: No results for input(s): "VITAMINB12", "FOLATE", "FERRITIN", "TIBC", "IRON", "RETICCTPCT" in the last 72 hours.   Urine analysis:    Component Value Date/Time   COLORURINE YELLOW 12/21/2021 0809   APPEARANCEUR HAZY (A) 12/21/2021 0809   LABSPEC 1.027 12/21/2021 0809   PHURINE 6.0 12/21/2021 0809   GLUCOSEU 50 (A) 12/21/2021 0809   HGBUR NEGATIVE 12/21/2021 0809   BILIRUBINUR NEGATIVE 12/21/2021 0809   KETONESUR NEGATIVE 12/21/2021 0809   PROTEINUR NEGATIVE 12/21/2021 0809   UROBILINOGEN 0.2 04/11/2011 1437   NITRITE NEGATIVE 12/21/2021 0809   LEUKOCYTESUR SMALL (A) 12/21/2021 0809    Sepsis Labs: Lactic Acid, Venous    Component Value Date/Time   LATICACIDVEN 7.5 ([place]) 12/05/2021 0238    MICROBIOLOGY: Recent Results (from the past 240 hour(s))  Blood culture (routine x 2)     Status: Abnormal   Collection Time: 12/21/21  4:00 AM   Specimen: BLOOD  Result Value Ref Range Status   Specimen Description BLOOD SITE NOT SPECIFIED  Final   Special Requests   Final    BOTTLES DRAWN AEROBIC AND ANAEROBIC Blood Culture adequate volume   Culture  Setup Time   Final    GRAM POSITIVE COCCI IN CHAINS AEROBIC BOTTLE ONLY Organism ID to follow CRITICAL RESULT CALLED TO, READ BACK BY AND VERIFIED WITH: T RUDISILL,PHARMD'@0229'$  12/22/21 [place] Performed at [facility], [address] [place], [place] [address]    Culture  STREPTOCOCCUS ANGINOSIS (A)  Final   Report Status 12/27/2021 FINAL  Final   Organism ID, Bacteria STREPTOCOCCUS ANGINOSIS  Final       Susceptibility   Streptococcus anginosis - MIC*    PENICILLIN <=0.06      CEFTRIAXONE <=0.12 SENSITIVE Sensitive     ERYTHROMYCIN >=8 RESISTANT Resistant     LEVOFLOXACIN <=0.25 SENSITIVE Sensitive     VANCOMYCIN 0.25 SENSITIVE Sensitive     * STREPTOCOCCUS ANGINOSIS  Blood Culture ID Panel (Reflexed)     Status: Abnormal   Collection Time: 12/21/21  4:00 AM  Result Value Ref Range Status   Enterococcus faecalis NOT DETECTED NOT DETECTED Final   Enterococcus Faecium NOT DETECTED NOT DETECTED Final   Listeria monocytogenes NOT DETECTED NOT DETECTED Final   Staphylococcus species NOT DETECTED NOT DETECTED Final   Staphylococcus aureus (BCID) NOT DETECTED NOT DETECTED Final   Staphylococcus epidermidis NOT DETECTED NOT DETECTED Final   Staphylococcus lugdunensis NOT DETECTED NOT DETECTED Final   Streptococcus species DETECTED (A) NOT DETECTED Final    Comment: Not Enterococcus species, Streptococcus agalactiae, Streptococcus pyogenes, or Streptococcus pneumoniae. CRITICAL RESULT CALLED TO, READ BACK BY AND VERIFIED WITH: T RUDISILL,PHARMD'@0229'$  12/22/21 [place]    Streptococcus agalactiae NOT DETECTED NOT DETECTED Final   Streptococcus pneumoniae NOT DETECTED NOT DETECTED Final   Streptococcus pyogenes NOT DETECTED NOT DETECTED Final   A.calcoaceticus-baumannii NOT DETECTED NOT DETECTED Final   Bacteroides fragilis NOT DETECTED NOT DETECTED Final   Enterobacterales NOT DETECTED NOT DETECTED Final   Enterobacter cloacae complex NOT DETECTED NOT DETECTED Final   Escherichia coli NOT DETECTED NOT DETECTED Final   Klebsiella aerogenes NOT DETECTED NOT DETECTED Final   Klebsiella oxytoca NOT DETECTED NOT DETECTED Final   Klebsiella pneumoniae NOT DETECTED NOT DETECTED Final   Proteus species NOT DETECTED NOT DETECTED Final   Salmonella species NOT DETECTED NOT DETECTED Final   Serratia marcescens NOT DETECTED NOT DETECTED Final   Haemophilus influenzae NOT DETECTED NOT DETECTED Final    Neisseria meningitidis NOT DETECTED NOT DETECTED Final   Pseudomonas aeruginosa NOT DETECTED NOT DETECTED Final   Stenotrophomonas maltophilia NOT DETECTED NOT DETECTED Final   Candida albicans NOT DETECTED NOT DETECTED Final   Candida auris NOT DETECTED NOT DETECTED Final   Candida glabrata NOT DETECTED NOT DETECTED Final   Candida krusei NOT DETECTED NOT DETECTED Final   Candida parapsilosis NOT DETECTED NOT DETECTED Final   Candida tropicalis NOT DETECTED NOT DETECTED Final   Cryptococcus neoformans/gattii NOT DETECTED NOT DETECTED Final    Comment: Performed at [facility], [address] [place], [place] [address]  Blood culture (routine x 2)     Status: None   Collection Time: 12/21/21  4:08 AM   Specimen: BLOOD  Result Value Ref Range Status   Specimen Description BLOOD SITE NOT SPECIFIED  Final   Special Requests   Final    BOTTLES DRAWN AEROBIC ONLY Blood Culture results may not be optimal due to an inadequate volume of blood received in culture bottles   Culture   Final    NO GROWTH 5 DAYS Performed at [facility], [place] [address], [place], [place] [address]    Report Status 12/26/2021 FINAL  Final  Culture, body fluid w Gram Stain-bottle     Status: None   Collection Time: 12/21/21  3:21 PM   Specimen: Fluid  Result Value Ref Range Status   Specimen Description FLUID PERITONEAL  Final   Special Requests  BOTTLES DRAWN AEROBIC AND ANAEROBIC  Final   Culture   Final    NO GROWTH 5 DAYS Performed at [facility], [place] [address], [place], [place] [address]    Report Status 12/26/2021 FINAL  Final  Gram stain     Status: None   Collection Time: 12/21/21  3:21 PM   Specimen: Fluid  Result Value Ref Range Status   Specimen Description FLUID PERITONEAL  Final   Special Requests NONE  Final   Gram Stain   Final    WBC PRESENT,BOTH PMN AND MONONUCLEAR NO ORGANISMS SEEN CYTOSPIN SMEAR Performed at [facility], [address] [place], [place]  [address]    Report Status 12/21/2021 FINAL  Final  Culture, blood (Routine X 2) w Reflex to ID Panel     Status: None   Collection Time: 12/22/21  6:16 PM   Specimen: BLOOD RIGHT HAND  Result Value Ref Range Status   Specimen Description BLOOD RIGHT HAND  Final   Special Requests   Final    BOTTLES DRAWN AEROBIC AND ANAEROBIC Blood Culture results may not be optimal due to an inadequate volume of blood received in culture bottles   Culture   Final    NO GROWTH 5 DAYS Performed at [facility], [place] [address], [place], [place] [address]    Report Status 12/27/2021 FINAL  Final  Culture, blood (Routine X 2) w Reflex to ID Panel     Status: None   Collection Time: 12/22/21  6:16 PM   Specimen: BLOOD  Result Value Ref Range Status   Specimen Description BLOOD SITE NOT SPECIFIED  Final   Special Requests   Final    BOTTLES DRAWN AEROBIC AND ANAEROBIC Blood Culture adequate volume   Culture   Final    NO GROWTH 5 DAYS Performed at [facility], [place] [address] [place], [place] [address]    Report Status 12/27/2021 FINAL  Final  Urine Culture     Status: None   Collection Time: 12/25/21  8:51 AM   Specimen: Urine, Catheterized  Result Value Ref Range Status   Specimen Description URINE, CATHETERIZED  Final   Special Requests NONE  Final   Culture   Final    NO GROWTH Performed at [place] [address], [place], [place] [address]    Report Status 12/26/2021 FINAL  Final    RADIOLOGY STUDIES/RESULTS: No results found.   LOS: 7 days   Oren Binet, MD  [facility]    To contact the attending provider between 7A-7P or the covering provider during after hours 7P-7A, please log into the web site [email or web address] and access using universal [place] password for that web site. If you do not have the password, please call the hospital operator.  12/28/2021, 12:41 PM

## 2021-12-28 NOTE — Progress Notes (Signed)
Bladder scan showed 383 cc. Patient requesting in and out cath.

## 2021-12-28 NOTE — Inpatient Diabetes Management (Signed)
Inpatient Diabetes Program Recommendations  AACE/ADA: New Consensus Statement on Inpatient Glycemic Control (2015)  Target Ranges:  Prepandial:   less than 140 mg/dL      Peak postprandial:   less than 180 mg/dL (1-2 hours)      Critically ill patients:  140 - 180 mg/dL   Lab Results  Component Value Date   GLUCAP 262 (H) 12/28/2021   HGBA1C 7.6 (H) 11/18/2021    Latest Reference Range & Units 12/27/21 07:48 12/27/21 12:14 12/27/21 15:45 12/27/21 17:50 12/27/21 21:11 12/28/21 07:50  Glucose-Capillary 70 - 99 mg/dL 291 (H) 244 (H) 296 (H) 361 (H) 337 (H) 262 (H)  (H): Data is abnormally high Review of Glycemic Control  Diabetes history: type 1 Outpatient Diabetes medications: Lantus 8 units BID, Apidra 0-8 units SSI and carbohydrates Current orders for Inpatient glycemic control: Semglee 10 units BID, Novolog 0-6 units TID correction scale and Novolog 0-5 units HS scale  Inpatient Diabetes Program Recommendations:   Noted that blood sugars continue to be greater than 180 mg/dl.  Recommend adding Novolog 3 units TID with meals if patient is eating at least 50% of meals and if blood sugars continue to be elevated.  Harvel Ricks RN BSN CDE Diabetes Coordinator Pager: [phone]  8am-5pm

## 2021-12-28 NOTE — Plan of Care (Signed)
  Problem: Skin Integrity: Goal: Risk for impaired skin integrity will decrease Outcome: Progressing   

## 2021-12-28 NOTE — TOC Progression Note (Signed)
Transition of Care [facility]) - Progression Note    Patient Details  Name: Anna Garcia MRN: [id number] Date of Birth: 1980-07-02  Transition of Care [facility]) CM/SW [place], [place] Phone Number: 12/28/2021, 11:50 AM  Clinical Narrative:     CSW to follow up with Janie closer to patient being medically ready for dc to confirm SNF bed. CSW following to start insurance authorization closer to patient being medically ready for dc. CSW will continue to follow and assist with patients dc planning needs.  Expected Discharge Plan: Skilled Nursing Facility Barriers to Discharge: Continued Medical Work up  Expected Discharge Plan and Services Expected Discharge Plan: [place] In-house Referral: Clinical Social Work Discharge Planning Services: CM Consult Post Acute Care Choice: [place] arrangements for the past 2 months: [place]                 DME Arranged: Bedside commode, Walker rolling DME Agency: [employer] Date DME Agency Contacted: 12/24/21 Time DME Agency Contacted: 100 Representative spoke with at DME Agency: [name] HH Arranged: PT, OT, RN, Disease Management, Nurse's Aide, Social Work [employer] Agency: [place] Date [place]: 12/24/21 Time [place]: 1405 Representative spoke with at [place]: [place] ([place]) Interventions    Readmission Risk Interventions    12/14/2021   10:09 AM 09/24/2021    1:14 PM  Readmission Risk Prevention Plan  Transportation Screening Complete   PCP or Specialist Appt within 3-5 Days Complete   HRI or [place] Complete Complete  Social Work Consult for [place] Planning/Counseling Complete Complete  Palliative Care Screening Not Applicable Not Applicable  Medication Review Press photographer) Complete

## 2021-12-29 DIAGNOSIS — K7460 Unspecified cirrhosis of liver: Secondary | ICD-10-CM | POA: Diagnosis not present

## 2021-12-29 DIAGNOSIS — Z789 Other specified health status: Secondary | ICD-10-CM | POA: Diagnosis not present

## 2021-12-29 DIAGNOSIS — G9340 Encephalopathy, unspecified: Secondary | ICD-10-CM | POA: Diagnosis not present

## 2021-12-29 DIAGNOSIS — R601 Generalized edema: Secondary | ICD-10-CM | POA: Diagnosis not present

## 2021-12-29 DIAGNOSIS — R188 Other ascites: Secondary | ICD-10-CM | POA: Diagnosis not present

## 2021-12-29 LAB — COMPREHENSIVE METABOLIC PANEL
ALT: 22 U/L (ref 0–44)
AST: 70 U/L — ABNORMAL HIGH (ref 15–41)
Albumin: 4 g/dL (ref 3.5–5.0)
Alkaline Phosphatase: 137 U/L — ABNORMAL HIGH (ref 38–126)
Anion gap: 12 (ref 5–15)
BUN: 8 mg/dL (ref 6–20)
CO2: 19 mmol/L — ABNORMAL LOW (ref 22–32)
Calcium: 9.2 mg/dL (ref 8.9–10.3)
Chloride: 105 mmol/L (ref 98–111)
Creatinine, Ser: 1.24 mg/dL — ABNORMAL HIGH (ref 0.44–1.00)
GFR, Estimated: 56 mL/min — ABNORMAL LOW (ref >60)
Glucose, Bld: 245 mg/dL — ABNORMAL HIGH (ref 70–99)
Potassium: 4 mmol/L (ref 3.5–5.1)
Sodium: 136 mmol/L (ref 135–145)
Total Bilirubin: 1.1 mg/dL (ref 0.3–1.2)
Total Protein: 6.4 g/dL — ABNORMAL LOW (ref 6.5–8.1)

## 2021-12-29 LAB — GLUCOSE, CAPILLARY
Glucose-Capillary: 248 mg/dL — ABNORMAL HIGH (ref 70–99)
Glucose-Capillary: 297 mg/dL — ABNORMAL HIGH (ref 70–99)
Glucose-Capillary: 383 mg/dL — ABNORMAL HIGH (ref 70–99)
Glucose-Capillary: 413 mg/dL — ABNORMAL HIGH (ref 70–99)
Glucose-Capillary: 413 mg/dL — ABNORMAL HIGH (ref 70–99)

## 2021-12-29 LAB — CBC
HCT: 26 % — ABNORMAL LOW (ref 36.0–46.0)
Hemoglobin: 8.3 g/dL — ABNORMAL LOW (ref 12.0–15.0)
MCH: 33.5 pg (ref 26.0–34.0)
MCHC: 31.9 g/dL (ref 30.0–36.0)
MCV: 104.8 fL — ABNORMAL HIGH (ref 80.0–100.0)
Platelets: 137 10*3/uL — ABNORMAL LOW (ref 150–400)
RBC: 2.48 MIL/uL — ABNORMAL LOW (ref 3.87–5.11)
RDW: 22.3 % — ABNORMAL HIGH (ref 11.5–15.5)
WBC: 6.9 10*3/uL (ref 4.0–10.5)
nRBC: 0 % (ref 0.0–0.2)

## 2021-12-29 LAB — MAGNESIUM: Magnesium: 2 mg/dL (ref 1.7–2.4)

## 2021-12-29 MED ORDER — PANTOPRAZOLE SODIUM 40 MG PO TBEC
40.0000 mg | DELAYED_RELEASE_TABLET | Freq: Every day | ORAL | Status: DC
  Administered 2021-12-29 – 2022-01-04 (×7): 40 mg via ORAL
  Filled 2021-12-29 (×7): qty 1

## 2021-12-29 MED ORDER — ALBUMIN HUMAN 25 % IV SOLN
50.0000 g | Freq: Two times a day (BID) | INTRAVENOUS | Status: DC
  Administered 2021-12-29 – 2021-12-30 (×4): 50 g via INTRAVENOUS
  Filled 2021-12-29 (×4): qty 200

## 2021-12-29 MED ORDER — INSULIN ASPART 100 UNIT/ML IJ SOLN
10.0000 [IU] | Freq: Once | INTRAMUSCULAR | Status: AC
  Administered 2021-12-29: 10 [IU] via SUBCUTANEOUS

## 2021-12-29 MED ORDER — DOXYCYCLINE HYCLATE 100 MG PO TABS
100.0000 mg | ORAL_TABLET | Freq: Two times a day (BID) | ORAL | Status: DC
  Administered 2021-12-29 – 2021-12-31 (×5): 100 mg via ORAL
  Filled 2021-12-29 (×5): qty 1

## 2021-12-29 MED ORDER — TRAMADOL HCL 50 MG PO TABS
50.0000 mg | ORAL_TABLET | Freq: Four times a day (QID) | ORAL | Status: DC | PRN
  Administered 2021-12-29 – 2022-01-03 (×7): 50 mg via ORAL
  Filled 2021-12-29 (×7): qty 1

## 2021-12-29 NOTE — Progress Notes (Signed)
[facility] ROUNDING NOTE   Subjective: Complains of pain in lower legs. She is tolerating diet.  Denies any abdominal pain, nausea or vomiting   Objective: Vital signs in last 24 hours: Temp:  [98 F (36.7 C)-98.2 F (36.8 C)] 98.1 F (36.7 C) (09/09 0700) Pulse Rate:  [77-94] 94 (09/09 0833) Resp:  [16-28] 16 (09/09 0840) BP: (79-97)/(53-64) 93/60 (09/09 0840) SpO2:  [97 %-99 %] 98 % (09/09 0833) Weight:  [77.6 kg] 77.6 kg (09/09 0500) Last BM Date : 12/26/21 General: NAD, alert and oriented X2 Abdomen: Distended, soft, nontender Ext: Bilateral edema No asterixis    Intake/Output from previous day: 09/08 0701 - 09/09 0700 In: 360 [P.O.:360] Out: 352 [Urine:350; Stool:2] Intake/Output this shift: No intake/output data recorded.   Lab Results: Recent Labs    12/28/21 0657 12/29/21 0243  WBC 4.4 6.9  HGB 7.6* 8.3*  PLT 117* 137*  MCV 104.8* 104.8*   BMET Recent Labs    12/27/21 0724 12/28/21 0657 12/29/21 0243  NA 141 141 136  K 2.6* 2.9* 4.0  CL 106 107 105  CO2 22 21* 19*  GLUCOSE 319* 301* 245*  BUN '7 7 8  '$ CREATININE 1.27* 1.19* 1.24*  CALCIUM 8.8* 9.1 9.2   LFT Recent Labs    12/29/21 0243  PROT 6.4*  ALBUMIN 4.0  AST 70*  ALT 22  ALKPHOS 137*  BILITOT 1.1   PT/INR No results for input(s): "INR" in the last 72 hours.    Imaging/Other results: No results found.    Assessment &Plan  41 year old female with recent diagnosis of decompensated cirrhosis, SBP, streptococcal bacteremia, acute metabolic encephalopathy  MELD 3.0: 16 at 12/28/2021  6:57 AM MELD-Na: 16 at 12/28/2021  6:57 AM  Decompensated cirrhosis: Likely etiology alcohol and progression of metabolic steatohepatitis, await copper stains on liver biopsy.  Serum copper pending.  Urine copper was not elevated No definite diagnosis of Wilson's disease Plan for outpatient ophthalmology slit-lamp exam to evaluate for KF ring  Encephalopathy: Multifactorial, ?   Alcohol withdrawal, hypoxic respiratory failure secondary to aspiration pneumonia, bacteremia, hepatic encephalopathy and additional nutritional deficiencies Warnicke's, cannot exclude Wilson's. Improving  Volume overload: Diuresis per primary team  Continue diet as tolerated  Currently on IV antibiotics, once completed, will need to transition to prophylactic antibiotics to prevent recurrent SBP   GI is available if needed, please call with any questions   K. Denzil Magnuson , MD [phone]  [facility]

## 2021-12-29 NOTE — Progress Notes (Signed)
Pt complaining of having some heartburn which followed with n/v. Dr. Cyd Silence notified with the possibility of ordering Zofran for pt an EKG was obtained as ordered by MD. Did not visualize medications given, Tylenol and Maalox, in emesis. Emesis appeared clear with undigested food particles noted. Pt stated that after eating supper tray the issue started.   0005-Pt able to tolerate Bentyl and Pepcid at this time. Dr. Cyd Silence updated on condition of pt. Pt did not want to take other meds at this time.

## 2021-12-29 NOTE — Progress Notes (Signed)
PROGRESS NOTE                                                                                                                                                                                                             Patient Demographics:    Anna Garcia, is a 40 y.o. female, DOB - 11-10-1980, UXL:[id number]  Outpatient Primary MD for the patient is Day, [name], MD    LOS - 8  Admit date - 12/20/2021    Chief Complaint  Patient presents with   Leg Swelling       Brief Narrative (HPI from H&P)    41 y.o.  female with history of DM-1, gastroparesis, chronic pain syndrome/fibromyalgia, HTN, chronic urinary retention-self caths at home-who was just discharged from this facility on 8/29 after being treated for DKA/AKI/volume overload-presented back to the hospital with anasarca-3+ pitting edema in the lower extremity up to the thighs.  She was found to have SBP in Streptococcus bacteremia.  Further hospital course was complicated by development of encephalopathy and acute hypoxic respiratory failure likely due to aspiration pneumonia.  See below for further details.   Significant events: 8/22-8/29>> hospitalization for DKA/AKI-found to have liver cirrhosis 8/31>> admit for anasarca/significant lower extremity edema. 9/01>> IV albumin started-midodrine dosage escalated. 9/03>> hallucinating-confused. 9/05>> hypoxic after Ativan given for MRI brain-started on Ventimask.  Transfer to progressive care-subsequently titrated down to 3-4 L.   Significant studies: 2/23>>Hbs Ag, HCV ZD:GUYQIHKV 8/24>> bilateral lower extremity Doppler: No DVT. 8/28>>ANA/RA/Anti mitrochondrial Ab/Anti smooth muscle QQ:[id number] 8/26>> Echo: EF-60-65%. 8/31>> CXR: No PNA 8/31>> CT chest/abdomen/pelvis: No PE, liver cirrhosis, prominent small bowel wall thickening, moderate volume ascites.  Generalized subcutaneous edema. 9/01>> UA: No proteinuria. 9/02>>  24 urine copper: 21 9/3>> MRI brain: Limited images-but no acute CVA. 9/3>> EEG: No seizures. 9/3>> NH 4: 37 9/3>> vitamin B12: 1027. 9/5>> vitamin B1: Pending 9/6 >> awaiting copper stains.   Significant microbiology data: 9/01>>1/2 blood culture: Streptococcus anginosus 9/01>>ascites fluid culture: No growth 9/02>> blood culture: No growth.   Procedures: 9/01>> diagnostic paracentesis-1 L (WBC 590 w 85% neutrophils) 9/06>> liver biopsy by IR.     Subjective:    Anna Garcia today has, No headache, No chest pain, No abdominal pain - No Nausea, No new weakness tingling or numbness, no SOB.   Assessment  & Plan :  Anasarca due to decompensated liver cirrhosis: On Lasix and Aldactone gradually diuresing, still has significant volume overload, blood pressure is still limiting factor continue to monitor on combination of diuretics as above along with midodrine and albumin for blood pressure augmentation.   Filed Weights   12/27/21 0500 12/28/21 0412 12/29/21 0500  Weight: 76.8 kg 75.6 kg 77.6 kg     Liver cirrhosis: Diagnosed recently on imaging studies-hepatitis serology/autoimmune work-up negative.  Recent EGD June 2023 did not show any obvious varices.  Suspicion for NASH or Wilson's disease (low ceruloplasmin levels but 24 urine copper not elevated).  Spoke with ophthalmologist-Dr. Patel-9/5-almost impossible to see KF rings without a slit-lamp exam-hence ophthalmology referral will need to be done in the outpatient setting.  Spoke with sister-in-law-[name]-no family history of liver issues that she is aware of.  Underwent liver biopsy on 9/6 - awaiting copper stains.  Acute hypoxic respiratory failure likely due to aspiration pneumonia: Significantly better-on room air-last day of Rocephin today-we will be on Flagyl for a few more days.  Avoid benzodiazepines.    Acute metabolic encephalopathy: Likely due to delirium-work-up as above-significantly better over the past few days but  still not at baseline. Although has hx of some ETOH use-not felt to have etoh withdrawal as she was readmitted within 1-2 days of discharge. No further recommendations from neurology-Dr. Bhagat-recommends continuing supportive care.  Continue Seroquel/melatonin.  No focal deficits no headache.   History of chronic urinary retention-s/p self-catheterization at home: Due to worsening encephalopathy-significant urinary retention-indwelling Foley catheter placed on 9/4-however this was pulled out by the patient a couple of nights ago.  Since mental status has improved-continue frequent bladder scans and in/out catheterization accordingly.   Streptococcus bacteremia/spontaneous bacterial peritonitis: Afebrile-clinically improved-last day of Rocephin 9/8-following which plans are to transition to prophylactic Bactrim therapy on 9/9.  ID following.  Remains on midodrine for soft blood pressure-was on IV albumin for several days during the earlier part of the hospital stay.   Cellulitis right foot: Improved-continue IV antibiotics, now on 5 days of doxycycline starting 12/29/2021.  Wound care continue.   AKI: Likely mild hemodynamically mediated kidney injury-cautiously continue with diuretics as she remains volume overloaded.    Hypokalemia: Replete and recheck.   Macrocytic anemia: S/p 1 unit of PRBC on 9/4-no evidence of hemolysis (LDH normal)-no evidence of blood loss.  Suspect this could be related to acute illness.  Continue to follow CBC closely.  Recent B12 level stable-folate slightly low-on supplementation.   History of esophageal stricture: S/p dilatation June 2023.  Denies any vomiting or dysphagia symptoms.   History of diabetic gastroparesis: No vomiting-appears stable-continue Reglan.  Encourage small portion meals.  HLD: Continue statin   Moderate persistent asthma: Not in flare-continue bronchodilators.  Chronic pain/fibromyalgia/neuropathy: Continue Neurontin.   DM-1 (A1c 7.6 on 7/30):  No further hypoglycemic episodes-CBGs now elevated as her diet has improved since encephalopathy is better.  Increase Semglee to 14 units twice daily-continue SSI-allow some amount of permissive hyperglycemia.   CBG (last 3)  Recent Labs    12/28/21 1728 12/28/21 2045 12/29/21 0727  GLUCAP 441* 310* 248*   Lab Results  Component Value Date   HGBA1C 7.6 (H) 11/18/2021        Condition - Extremely Guarded  Family Communication  : None present  Code Status :  Full  Consults  :  GI, IR, ID  PUD Prophylaxis : PPI   Procedures  :  Disposition Plan  :    Status is: Inpatient  DVT Prophylaxis  :    enoxaparin (LOVENOX) injection 40 mg Start: 12/21/21 1000   Lab Results  Component Value Date   PLT 137 (L) 12/29/2021    Diet :  Diet Order             Diet heart healthy/carb modified Room service appropriate? No; Fluid consistency: Thin  Diet effective now                    Inpatient Medications  Scheduled Meds:  Chlorhexidine Gluconate Cloth  6 each Topical Q0600   dicyclomine  10 mg Oral TID AC & HS   doxycycline  100 mg Oral Q12H   enoxaparin (LOVENOX) injection  40 mg Subcutaneous Q24H   famotidine  40 mg Oral BID   feeding supplement  237 mL Oral BID BM   folic acid  2 mg Oral Daily   furosemide  40 mg Oral Daily   gabapentin  300 mg Oral BID   insulin aspart  0-5 Units Subcutaneous QHS   insulin aspart  0-6 Units Subcutaneous TID WC   insulin glargine-yfgn  14 Units Subcutaneous BID   lactulose  10 g Oral BID   lipase/protease/amylase  36,000 Units Oral TID WC   melatonin  5 mg Oral QHS   metoCLOPramide  10 mg Oral TID AC   midodrine  10 mg Oral TID WC   mometasone-formoterol  2 puff Inhalation BID   montelukast  10 mg Oral QHS   multivitamin with minerals  1 tablet Oral Daily   QUEtiapine  50 mg Oral QHS   rosuvastatin  20 mg Oral QHS   sodium chloride flush  3 mL Intravenous Q12H   spironolactone  100 mg Oral Daily    triamcinolone   Topical BID   Continuous Infusions:  albumin human     PRN Meds:.acetaminophen, albuterol, alum & mag hydroxide-simeth, influenza vac split quadrivalent PF, loperamide, naLOXone (NARCAN)  injection, mouth rinse  Time Spent in minutes  30   Lala Lund M.D on 12/29/2021 at 8:40 AM  To page go to [email or web address]   [facility] -  Office  [phone]  See all Orders from today for further details    Objective:   Vitals:   12/29/21 0015 12/29/21 0500 12/29/21 0700 12/29/21 0833  BP: 96/60 92/64 (!) 79/53   Pulse: 79 77 77 94  Resp: '20 17 17 '$ (!) 28  Temp: 98.1 F (36.7 C) 98.1 F (36.7 C) 98.1 F (36.7 C)   TempSrc: Oral Oral Oral   SpO2: 98% 99% 98% 98%  Weight:  77.6 kg    Height:        Wt Readings from Last 3 Encounters:  12/29/21 77.6 kg  12/12/21 89.1 kg  12/07/21 84 kg     Intake/Output Summary (Last 24 hours) at 12/29/2021 0840 Last data filed at 12/29/2021 0654 Gross per 24 hour  Intake 360 ml  Output 352 ml  Net 8 ml     Physical Exam  Awake but minimally confused, 3+ leg edema, right foot dorsal aspect skin under bandage, no focal deficits,  [place].AT,PERRAL Supple Neck, No JVD,   Symmetrical Chest wall movement, Good air movement bilaterally, CTAB RRR,No Gallops,Rubs or new Murmurs,  +ve B.Sounds, Abd Soft, No tenderness,      Data Review:    CBC Recent Labs  Lab 12/24/21 0258 12/24/21 1700 12/25/21 0235 12/26/21 0636 12/28/21 7353  12/29/21 0243  WBC 5.1  --  7.3 8.5 4.4 6.9  HGB 6.8* 8.5* 9.3* 8.2* 7.6* 8.3*  HCT 21.2* 26.5* 28.4* 25.7* 23.8* 26.0*  PLT 147*  --  159 126* 117* 137*  MCV 109.3*  --  101.8* 103.6* 104.8* 104.8*  MCH 35.1*  --  33.3 33.1 33.5 33.5  MCHC 32.1  --  32.7 31.9 31.9 31.9  RDW 18.3*  --  23.2* 23.0* 22.4* 22.3*    Electrolytes Recent Labs  Lab 12/23/21 0448 12/24/21 0258 12/25/21 0235 12/26/21 0636 12/27/21 0717 12/27/21 0724 12/28/21 0657 12/29/21 0243  NA 140   < > 147* 144   --  141 141 136  K 3.0*   < > 3.6 3.2*  --  2.6* 2.9* 4.0  CL 106   < > 109 109  --  106 107 105  CO2 23   < > 25 23  --  22 21* 19*  GLUCOSE 112*   < > 95 176*  --  319* 301* 245*  BUN 7   < > <5* <5*  --  '7 7 8  '$ CREATININE 1.33*   < > 1.19* 1.22*  --  1.27* 1.19* 1.24*  CALCIUM 9.1   < > 9.8 9.4  --  8.8* 9.1 9.2  AST 31  --   --  40  --   --   --  70*  ALT 18  --   --  16  --   --   --  22  ALKPHOS 129*  --   --  89  --   --   --  137*  BILITOT 1.1  --   --  2.1*  --   --   --  1.1  ALBUMIN 3.3*  --   --  4.0  --   --   --  4.0  MG 1.9  --  2.3  --   --  2.0 2.1 2.0  PROCALCITON  --   --  0.50 0.46 0.97  --   --   --   INR  --   --   --  1.5*  --   --   --   --   AMMONIA 37*  --  35 42*  --   --   --   --    < > = values in this interval not displayed.    ------------------------------------------------------------------------------------------------------------------ No results for input(s): "CHOL", "HDL", "LDLCALC", "TRIG", "CHOLHDL", "LDLDIRECT" in the last 72 hours.  Lab Results  Component Value Date   HGBA1C 7.6 (H) 11/18/2021    No results for input(s): "TSH", "T4TOTAL", "T3FREE", "THYROIDAB" in the last 72 hours.  Invalid input(s): "FREET3" ------------------------------------------------------------------------------------------------------------------ ID Labs Recent Labs  Lab 12/24/21 0258 12/25/21 0235 12/26/21 0636 12/27/21 0717 12/27/21 0724 12/28/21 0657 12/29/21 0243  WBC 5.1 7.3 8.5  --   --  4.4 6.9  PLT 147* 159 126*  --   --  117* 137*  PROCALCITON  --  0.50 0.46 0.97  --   --   --   CREATININE 1.33* 1.19* 1.22*  --  1.27* 1.19* 1.24*    Radiology Reports IR US Guide Bx Asp/Drain  Result Date: 12/26/2021 INDICATION: 41 year old female referred for medical liver biopsy EXAM: IR ULTRASOUND GUIDANCE; ULTRASOUND BIOPSY CORE LIVER MEDICATIONS: None. ANESTHESIA/SEDATION: Moderate (conscious) sedation was not employed during this procedure. A total of  Versed 0.5 mg and Fentanyl 0 mcg was administered intravenously. Moderate Sedation Time:  0 minutes. The patient's level of consciousness and vital signs were monitored continuously by radiology nursing throughout the procedure under my direct supervision. FLUOROSCOPY TIME:  Ultrasound COMPLICATIONS: None PROCEDURE: Informed written consent was obtained from the patient's family after a thorough discussion of the procedural risks, benefits and alternatives. All questions were addressed. Maximal Sterile Barrier Technique was utilized including caps, mask, sterile gowns, sterile gloves, sterile drape, hand hygiene and skin antiseptic. A timeout was performed prior to the initiation of the procedure. Ultrasound survey of the left liver lobe performed with images stored and sent to PACs. No ascites was identified. The subxiphoid region was prepped with chlorhexidine in a sterile fashion, and a sterile drape was applied covering the operative field. A sterile gown and sterile gloves were used for the procedure. Local anesthesia was provided with 1% Lidocaine. The patient was prepped and draped sterilely and the skin and subcutaneous tissues were generously infiltrated with 1% lidocaine. A 17 gauge introducer needle was then advanced under ultrasound guidance in subxiphoid region into the left liver lobe. The stylet was removed, and multiple separate 18 gauge core biopsy were retrieved. Samples were placed into formalin for transportation to the lab. Gel-Foam pledgets were then infused with a small amount of saline for assistance with hemostasis. The needle was removed, and a final ultrasound image was performed. The patient tolerated the procedure well and remained hemodynamically stable throughout. No complications were encountered and no significant blood loss was encounter. IMPRESSION: Status post ultrasound-guided medical liver biopsy of the left liver. Signed, Dulcy Fanny. Nadene Rubins, RPVI [facility] Electronically Signed   By: Corrie Mckusick D.O.   On: 12/26/2021 14:01

## 2021-12-29 NOTE — Plan of Care (Signed)
  Problem: Metabolic: Goal: Ability to maintain appropriate glucose levels will improve Outcome: Not Progressing   Problem: Skin Integrity: Goal: Risk for impaired skin integrity will decrease Outcome: Progressing

## 2021-12-30 DIAGNOSIS — R188 Other ascites: Secondary | ICD-10-CM | POA: Diagnosis not present

## 2021-12-30 DIAGNOSIS — K7460 Unspecified cirrhosis of liver: Secondary | ICD-10-CM | POA: Diagnosis not present

## 2021-12-30 LAB — CBC WITH DIFFERENTIAL/PLATELET
Abs Immature Granulocytes: 0.02 10*3/uL (ref 0.00–0.07)
Basophils Absolute: 0.1 10*3/uL (ref 0.0–0.1)
Basophils Relative: 1 %
Eosinophils Absolute: 0.1 10*3/uL (ref 0.0–0.5)
Eosinophils Relative: 1 %
HCT: 21.7 % — ABNORMAL LOW (ref 36.0–46.0)
Hemoglobin: 7.2 g/dL — ABNORMAL LOW (ref 12.0–15.0)
Immature Granulocytes: 0 %
Lymphocytes Relative: 36 %
Lymphs Abs: 1.9 10*3/uL (ref 0.7–4.0)
MCH: 34.3 pg — ABNORMAL HIGH (ref 26.0–34.0)
MCHC: 33.2 g/dL (ref 30.0–36.0)
MCV: 103.3 fL — ABNORMAL HIGH (ref 80.0–100.0)
Monocytes Absolute: 0.5 10*3/uL (ref 0.1–1.0)
Monocytes Relative: 10 %
Neutro Abs: 2.6 10*3/uL (ref 1.7–7.7)
Neutrophils Relative %: 52 %
Platelets: 101 10*3/uL — ABNORMAL LOW (ref 150–400)
RBC: 2.1 MIL/uL — ABNORMAL LOW (ref 3.87–5.11)
RDW: 22 % — ABNORMAL HIGH (ref 11.5–15.5)
Smear Review: NORMAL
WBC: 5.2 10*3/uL (ref 4.0–10.5)
nRBC: 0 % (ref 0.0–0.2)

## 2021-12-30 LAB — GLUCOSE, CAPILLARY
Glucose-Capillary: 292 mg/dL — ABNORMAL HIGH (ref 70–99)
Glucose-Capillary: 353 mg/dL — ABNORMAL HIGH (ref 70–99)
Glucose-Capillary: 370 mg/dL — ABNORMAL HIGH (ref 70–99)
Glucose-Capillary: 375 mg/dL — ABNORMAL HIGH (ref 70–99)

## 2021-12-30 LAB — COMPREHENSIVE METABOLIC PANEL
ALT: 19 U/L (ref 0–44)
AST: 51 U/L — ABNORMAL HIGH (ref 15–41)
Albumin: 4.6 g/dL (ref 3.5–5.0)
Alkaline Phosphatase: 133 U/L — ABNORMAL HIGH (ref 38–126)
Anion gap: 12 (ref 5–15)
BUN: 6 mg/dL (ref 6–20)
CO2: 20 mmol/L — ABNORMAL LOW (ref 22–32)
Calcium: 9.7 mg/dL (ref 8.9–10.3)
Chloride: 105 mmol/L (ref 98–111)
Creatinine, Ser: 1.16 mg/dL — ABNORMAL HIGH (ref 0.44–1.00)
GFR, Estimated: >60 mL/min (ref >60)
Glucose, Bld: 308 mg/dL — ABNORMAL HIGH (ref 70–99)
Potassium: 3.5 mmol/L (ref 3.5–5.1)
Sodium: 137 mmol/L (ref 135–145)
Total Bilirubin: 1.3 mg/dL — ABNORMAL HIGH (ref 0.3–1.2)
Total Protein: 6.6 g/dL (ref 6.5–8.1)

## 2021-12-30 LAB — COPPER, SERUM: Copper: 33 ug/dL — ABNORMAL LOW (ref 80–158)

## 2021-12-30 LAB — MAGNESIUM: Magnesium: 2.1 mg/dL (ref 1.7–2.4)

## 2021-12-30 LAB — BRAIN NATRIURETIC PEPTIDE: B Natriuretic Peptide: 650.8 pg/mL — ABNORMAL HIGH (ref 0.0–100.0)

## 2021-12-30 MED ORDER — ALBUMIN HUMAN 25 % IV SOLN
INTRAVENOUS | Status: AC
  Filled 2021-12-30: qty 100

## 2021-12-30 NOTE — Plan of Care (Signed)
  Problem: Metabolic: Goal: Ability to maintain appropriate glucose levels will improve Outcome: Progressing   Problem: Skin Integrity: Goal: Risk for impaired skin integrity will decrease Outcome: Progressing   Problem: Tissue Perfusion: Goal: Adequacy of tissue perfusion will improve Outcome: Progressing   Problem: Clinical Measurements: Goal: Respiratory complications will improve Outcome: Progressing   Problem: Coping: Goal: Level of anxiety will decrease Outcome: Progressing   Problem: Elimination: Goal: Will not experience complications related to urinary retention Outcome: Progressing   Problem: Pain Managment: Goal: General experience of comfort will improve Outcome: Progressing   Problem: Safety: Goal: Ability to remain free from injury will improve Outcome: Progressing   Problem: Skin Integrity: Goal: Risk for impaired skin integrity will decrease Outcome: Progressing

## 2021-12-30 NOTE — Progress Notes (Signed)
PROGRESS NOTE                                                                                                                                                                                                             Patient Demographics:    Anna Garcia, is a 41 y.o. female, DOB - 02-Jul-1980, MGQ:[id number]  Outpatient Primary MD for the patient is Day, [name], MD    LOS - 9  Admit date - 12/20/2021    Chief Complaint  Patient presents with   Leg Swelling       Brief Narrative (HPI from H&P)    41 y.o.  female with history of DM-1, gastroparesis, chronic pain syndrome/fibromyalgia, HTN, chronic urinary retention-self caths at home-who was just discharged from this facility on 8/29 after being treated for DKA/AKI/volume overload-presented back to the hospital with anasarca-3+ pitting edema in the lower extremity up to the thighs.  She was found to have SBP in Streptococcus bacteremia.  Further hospital course was complicated by development of encephalopathy and acute hypoxic respiratory failure likely due to aspiration pneumonia.  See below for further details.   Significant events: 8/22-8/29>> hospitalization for DKA/AKI-found to have liver cirrhosis 8/31>> admit for anasarca/significant lower extremity edema. 9/01>> IV albumin started-midodrine dosage escalated. 9/03>> hallucinating-confused. 9/05>> hypoxic after Ativan given for MRI brain-started on Ventimask.  Transfer to progressive care-subsequently titrated down to 3-4 L.   Significant studies: 2/23>>Hbs Ag, HCV OI:[id number] 8/24>> bilateral lower extremity Doppler: No DVT. 8/28>>ANA/RA/Anti mitrochondrial Ab/Anti smooth muscle XI:PJASNKNL 8/26>> Echo: EF-60-65%. 8/31>> CXR: No PNA 8/31>> CT chest/abdomen/pelvis: No PE, liver cirrhosis, prominent small bowel wall thickening, moderate volume ascites.  Generalized subcutaneous edema. 9/01>> UA: No proteinuria. 9/02>>  24 urine copper: 21 9/3>> MRI brain: Limited images-but no acute CVA. 9/3>> EEG: No seizures. 9/3>> NH 4: 37 9/3>> vitamin B12: 1027. 9/5>> vitamin B1: Pending 9/6 >> awaiting copper stains.   Significant microbiology data: 9/01>>1/2 blood culture: Streptococcus anginosus 9/01>>ascites fluid culture: No growth 9/02>> blood culture: No growth.   Procedures: 9/01>> diagnostic paracentesis-1 L (WBC 590 w 85% neutrophils) 9/06>> liver biopsy by IR.     Subjective:   Patient in bed, appears comfortable, denies any headache, no fever, no chest pain or pressure, no shortness of breath , no abdominal pain. No new focal weakness.    Assessment  &  Plan :   Anasarca due to decompensated liver cirrhosis: On Lasix and Aldactone gradually diuresing, still has significant volume overload, blood pressure is still limiting factor continue to monitor on combination of diuretics as above along with midodrine and albumin for blood pressure augmentation.   Filed Weights   12/27/21 0500 12/28/21 0412 12/29/21 0500  Weight: 76.8 kg 75.6 kg 77.6 kg     Liver cirrhosis: Diagnosed recently on imaging studies-hepatitis serology/autoimmune work-up negative.  Recent EGD June 2023 did not show any obvious varices.  Suspicion for NASH or Wilson's disease (low ceruloplasmin levels but 24 urine copper not elevated).  Spoke with ophthalmologist-Dr. Patel-9/5-almost impossible to see KF rings without a slit-lamp exam-hence ophthalmology referral will need to be done in the outpatient setting.  Spoke with sister-in-law-[name]-no family history of liver issues that she is aware of.  Underwent liver biopsy on 9/6 - awaiting copper stains.  Acute hypoxic respiratory failure likely due to aspiration pneumonia: Significantly better-on room air-last day of Rocephin today-we will be on Flagyl for a few more days.  Avoid benzodiazepines.    Acute metabolic encephalopathy: Likely due to delirium-work-up as  above-significantly better over the past few days but still not at baseline. Although has hx of some ETOH use-not felt to have etoh withdrawal as she was readmitted within 1-2 days of discharge. No further recommendations from neurology-Dr. Bhagat-recommends continuing supportive care.  Continue Seroquel/melatonin.  No focal deficits no headache.   History of chronic urinary retention-s/p self-catheterization at home: Due to worsening encephalopathy-significant urinary retention-indwelling Foley catheter placed on 9/4-however this was pulled out by the patient a couple of nights ago.  Since mental status has improved-continue frequent bladder scans and in/out catheterization accordingly.   Streptococcus bacteremia/spontaneous bacterial peritonitis: Afebrile-clinically improved-last day of Rocephin 9/8-following which plans are to transition to prophylactic Bactrim therapy on 9/9.  ID following.  Remains on midodrine for soft blood pressure-was on IV albumin for several days during the earlier part of the hospital stay.   Cellulitis right foot: Improved-continue IV antibiotics, now on 5 days of doxycycline starting 12/29/2021.  Wound care continue.   AKI: Likely mild hemodynamically mediated kidney injury-cautiously continue with diuretics as she remains volume overloaded.    Hypokalemia: Replete and recheck.   Macrocytic anemia: S/p 1 unit of PRBC on 9/4-no evidence of hemolysis (LDH normal)-no evidence of blood loss.  Suspect this could be related to acute illness.  Continue to follow CBC closely.  Recent B12 level stable-folate slightly low-on supplementation.  Type screen done will transfuse if she drops close to 7.   History of esophageal stricture: S/p dilatation June 2023.  Denies any vomiting or dysphagia symptoms.   History of diabetic gastroparesis: No vomiting-appears stable-continue Reglan.  Encourage small portion meals.  HLD: Continue statin   Moderate persistent asthma: Not in  flare-continue bronchodilators.  Chronic pain/fibromyalgia/neuropathy: Continue Neurontin.   DM-1 (A1c 7.6 on 7/30): No further hypoglycemic episodes-CBGs now elevated as her diet has improved since encephalopathy is better.  Increase Semglee to 14 units twice daily-continue SSI-allow some amount of permissive hyperglycemia.   CBG (last 3)  Recent Labs    12/29/21 2201 12/29/21 2239 12/30/21 0820  GLUCAP 413* 413* 292*   Lab Results  Component Value Date   HGBA1C 7.6 (H) 11/18/2021        Condition - Extremely Guarded  Family Communication  : None present  Code Status :  Full  Consults  :  GI, IR, ID  PUD Prophylaxis :  PPI   Procedures  :            Disposition Plan  :    Status is: Inpatient  DVT Prophylaxis  :    enoxaparin (LOVENOX) injection 40 mg Start: 12/21/21 1000   Lab Results  Component Value Date   PLT 101 (L) 12/30/2021    Diet :  Diet Order             Diet heart healthy/carb modified Room service appropriate? No; Fluid consistency: Thin; Fluid restriction: 1200 mL Fluid  Diet effective now                    Inpatient Medications  Scheduled Meds:  Chlorhexidine Gluconate Cloth  6 each Topical Q0600   dicyclomine  10 mg Oral TID AC & HS   doxycycline  100 mg Oral Q12H   enoxaparin (LOVENOX) injection  40 mg Subcutaneous Q24H   famotidine  40 mg Oral BID   feeding supplement  237 mL Oral BID BM   folic acid  2 mg Oral Daily   furosemide  40 mg Oral Daily   gabapentin  300 mg Oral BID   insulin aspart  0-5 Units Subcutaneous QHS   insulin aspart  0-6 Units Subcutaneous TID WC   insulin glargine-yfgn  14 Units Subcutaneous BID   lactulose  10 g Oral BID   lipase/protease/amylase  36,000 Units Oral TID WC   melatonin  5 mg Oral QHS   metoCLOPramide  10 mg Oral TID AC   midodrine  10 mg Oral TID WC   mometasone-formoterol  2 puff Inhalation BID   montelukast  10 mg Oral QHS   multivitamin with minerals  1 tablet Oral Daily    pantoprazole  40 mg Oral Daily   QUEtiapine  50 mg Oral QHS   rosuvastatin  20 mg Oral QHS   sodium chloride flush  3 mL Intravenous Q12H   spironolactone  100 mg Oral Daily   triamcinolone   Topical BID   Continuous Infusions:  albumin human 50 g (12/29/21 2251)   PRN Meds:.acetaminophen, albuterol, alum & mag hydroxide-simeth, influenza vac split quadrivalent PF, loperamide, naLOXone (NARCAN)  injection, mouth rinse, traMADol  Time Spent in minutes  30   Lala Lund M.D on 12/30/2021 at 9:57 AM  To page go to [email or web address]   [facility] -  Office  [phone]  See all Orders from today for further details    Objective:   Vitals:   12/29/21 2013 12/30/21 0033 12/30/21 0630 12/30/21 0841  BP: 101/73 108/74 93/61   Pulse: 81 97 87   Resp: '14 19 15   '$ Temp: 98.4 F (36.9 C) 98.3 F (36.8 C) 98.3 F (36.8 C)   TempSrc: Oral Oral Oral   SpO2: 98% 98%  96%  Weight:      Height:        Wt Readings from Last 3 Encounters:  12/29/21 77.6 kg  12/12/21 89.1 kg  12/07/21 84 kg     Intake/Output Summary (Last 24 hours) at 12/30/2021 0957 Last data filed at 12/30/2021 0021 Gross per 24 hour  Intake --  Output 1450 ml  Net -1450 ml     Physical Exam  Awake but minimally confused, 3+ leg edema, right foot dorsal aspect skin under bandage, no focal deficits,  [place].AT,PERRAL Supple Neck, No JVD,   Symmetrical Chest wall movement, Good air movement bilaterally, CTAB RRR,No Gallops, Rubs or new Murmurs,  +ve  B.Sounds, Abd Soft, No tenderness,   No Cyanosis      Data Review:    CBC Recent Labs  Lab 12/25/21 0235 12/26/21 0636 12/28/21 0657 12/29/21 0243 12/30/21 0412  WBC 7.3 8.5 4.4 6.9 5.2  HGB 9.3* 8.2* 7.6* 8.3* 7.2*  HCT 28.4* 25.7* 23.8* 26.0* 21.7*  PLT 159 126* 117* 137* 101*  MCV 101.8* 103.6* 104.8* 104.8* 103.3*  MCH 33.3 33.1 33.5 33.5 34.3*  MCHC 32.7 31.9 31.9 31.9 33.2  RDW 23.2* 23.0* 22.4* 22.3* 22.0*  LYMPHSABS  --   --    --   --  1.9  MONOABS  --   --   --   --  0.5  EOSABS  --   --   --   --  0.1  BASOSABS  --   --   --   --  0.1    Electrolytes Recent Labs  Lab 12/25/21 0235 12/26/21 0636 12/27/21 0717 12/27/21 0724 12/28/21 0657 12/29/21 0243 12/30/21 0412  NA 147* 144  --  141 141 136 137  K 3.6 3.2*  --  2.6* 2.9* 4.0 3.5  CL 109 109  --  106 107 105 105  CO2 25 23  --  22 21* 19* 20*  GLUCOSE 95 176*  --  319* 301* 245* 308*  BUN <5* <5*  --  '7 7 8 6  '$ CREATININE 1.19* 1.22*  --  1.27* 1.19* 1.24* 1.16*  CALCIUM 9.8 9.4  --  8.8* 9.1 9.2 9.7  AST  --  40  --   --   --  70* 51*  ALT  --  16  --   --   --  22 19  ALKPHOS  --  89  --   --   --  137* 133*  BILITOT  --  2.1*  --   --   --  1.1 1.3*  ALBUMIN  --  4.0  --   --   --  4.0 4.6  MG 2.3  --   --  2.0 2.1 2.0 2.1  PROCALCITON 0.50 0.46 0.97  --   --   --   --   INR  --  1.5*  --   --   --   --   --   AMMONIA 35 42*  --   --   --   --   --   BNP  --   --   --   --   --   --  650.8*    ------------------------------------------------------------------------------------------------------------------ No results for input(s): "CHOL", "HDL", "LDLCALC", "TRIG", "CHOLHDL", "LDLDIRECT" in the last 72 hours.  Lab Results  Component Value Date   HGBA1C 7.6 (H) 11/18/2021    No results for input(s): "TSH", "T4TOTAL", "T3FREE", "THYROIDAB" in the last 72 hours.  Invalid input(s): "FREET3" ------------------------------------------------------------------------------------------------------------------ ID Labs Recent Labs  Lab 12/25/21 0235 12/26/21 0636 12/27/21 0717 12/27/21 0724 12/28/21 0657 12/29/21 0243 12/30/21 0412  WBC 7.3 8.5  --   --  4.4 6.9 5.2  PLT 159 126*  --   --  117* 137* 101*  PROCALCITON 0.50 0.46 0.97  --   --   --   --   CREATININE 1.19* 1.22*  --  1.27* 1.19* 1.24* 1.16*    Radiology Reports IR US Guide Bx Asp/Drain  Result Date: 12/26/2021 INDICATION: 41 year old female referred for medical liver  biopsy EXAM: IR ULTRASOUND GUIDANCE; ULTRASOUND BIOPSY CORE LIVER MEDICATIONS: None. ANESTHESIA/SEDATION: Moderate (conscious)  sedation was not employed during this procedure. A total of Versed 0.5 mg and Fentanyl 0 mcg was administered intravenously. Moderate Sedation Time: 0 minutes. The patient's level of consciousness and vital signs were monitored continuously by radiology nursing throughout the procedure under my direct supervision. FLUOROSCOPY TIME:  Ultrasound COMPLICATIONS: None PROCEDURE: Informed written consent was obtained from the patient's family after a thorough discussion of the procedural risks, benefits and alternatives. All questions were addressed. Maximal Sterile Barrier Technique was utilized including caps, mask, sterile gowns, sterile gloves, sterile drape, hand hygiene and skin antiseptic. A timeout was performed prior to the initiation of the procedure. Ultrasound survey of the left liver lobe performed with images stored and sent to PACs. No ascites was identified. The subxiphoid region was prepped with chlorhexidine in a sterile fashion, and a sterile drape was applied covering the operative field. A sterile gown and sterile gloves were used for the procedure. Local anesthesia was provided with 1% Lidocaine. The patient was prepped and draped sterilely and the skin and subcutaneous tissues were generously infiltrated with 1% lidocaine. A 17 gauge introducer needle was then advanced under ultrasound guidance in subxiphoid region into the left liver lobe. The stylet was removed, and multiple separate 18 gauge core biopsy were retrieved. Samples were placed into formalin for transportation to the lab. Gel-Foam pledgets were then infused with a small amount of saline for assistance with hemostasis. The needle was removed, and a final ultrasound image was performed. The patient tolerated the procedure well and remained hemodynamically stable throughout. No complications were encountered and  no significant blood loss was encounter. IMPRESSION: Status post ultrasound-guided medical liver biopsy of the left liver. Signed, Dulcy Fanny. Nadene Rubins, RPVI [facility] Radiology Electronically Signed   By: Corrie Mckusick D.O.   On: 12/26/2021 14:01

## 2021-12-31 DIAGNOSIS — R188 Other ascites: Secondary | ICD-10-CM | POA: Diagnosis not present

## 2021-12-31 DIAGNOSIS — K7460 Unspecified cirrhosis of liver: Secondary | ICD-10-CM | POA: Diagnosis not present

## 2021-12-31 LAB — COMPREHENSIVE METABOLIC PANEL
ALT: 17 U/L (ref 0–44)
AST: 46 U/L — ABNORMAL HIGH (ref 15–41)
Albumin: 4.7 g/dL (ref 3.5–5.0)
Alkaline Phosphatase: 137 U/L — ABNORMAL HIGH (ref 38–126)
Anion gap: 10 (ref 5–15)
BUN: <5 mg/dL — ABNORMAL LOW (ref 6–20)
CO2: 21 mmol/L — ABNORMAL LOW (ref 22–32)
Calcium: 10 mg/dL (ref 8.9–10.3)
Chloride: 105 mmol/L (ref 98–111)
Creatinine, Ser: 1.34 mg/dL — ABNORMAL HIGH (ref 0.44–1.00)
GFR, Estimated: 51 mL/min — ABNORMAL LOW (ref >60)
Glucose, Bld: 306 mg/dL — ABNORMAL HIGH (ref 70–99)
Potassium: 3.2 mmol/L — ABNORMAL LOW (ref 3.5–5.1)
Sodium: 136 mmol/L (ref 135–145)
Total Bilirubin: 1.5 mg/dL — ABNORMAL HIGH (ref 0.3–1.2)
Total Protein: 6.6 g/dL (ref 6.5–8.1)

## 2021-12-31 LAB — CBC WITH DIFFERENTIAL/PLATELET
Abs Immature Granulocytes: 0.02 10*3/uL (ref 0.00–0.07)
Basophils Absolute: 0.1 10*3/uL (ref 0.0–0.1)
Basophils Relative: 1 %
Eosinophils Absolute: 0.1 10*3/uL (ref 0.0–0.5)
Eosinophils Relative: 2 %
HCT: 21.2 % — ABNORMAL LOW (ref 36.0–46.0)
Hemoglobin: 7 g/dL — ABNORMAL LOW (ref 12.0–15.0)
Immature Granulocytes: 0 %
Lymphocytes Relative: 32 %
Lymphs Abs: 2 10*3/uL (ref 0.7–4.0)
MCH: 33.8 pg (ref 26.0–34.0)
MCHC: 33 g/dL (ref 30.0–36.0)
MCV: 102.4 fL — ABNORMAL HIGH (ref 80.0–100.0)
Monocytes Absolute: 0.6 10*3/uL (ref 0.1–1.0)
Monocytes Relative: 10 %
Neutro Abs: 3.6 10*3/uL (ref 1.7–7.7)
Neutrophils Relative %: 55 %
Platelets: 104 10*3/uL — ABNORMAL LOW (ref 150–400)
RBC: 2.07 MIL/uL — ABNORMAL LOW (ref 3.87–5.11)
RDW: 22.9 % — ABNORMAL HIGH (ref 11.5–15.5)
WBC: 6.4 10*3/uL (ref 4.0–10.5)
nRBC: 0 % (ref 0.0–0.2)

## 2021-12-31 LAB — GLUCOSE, CAPILLARY
Glucose-Capillary: 324 mg/dL — ABNORMAL HIGH (ref 70–99)
Glucose-Capillary: 301 mg/dL — ABNORMAL HIGH (ref 70–99)
Glucose-Capillary: 311 mg/dL — ABNORMAL HIGH (ref 70–99)
Glucose-Capillary: 308 mg/dL — ABNORMAL HIGH (ref 70–99)
Glucose-Capillary: 285 mg/dL — ABNORMAL HIGH (ref 70–99)

## 2021-12-31 LAB — PREPARE RBC (CROSSMATCH)

## 2021-12-31 LAB — HEMOGLOBIN AND HEMATOCRIT, BLOOD
HCT: 27 % — ABNORMAL LOW (ref 36.0–46.0)
Hemoglobin: 8.9 g/dL — ABNORMAL LOW (ref 12.0–15.0)

## 2021-12-31 LAB — MAGNESIUM: Magnesium: 1.9 mg/dL (ref 1.7–2.4)

## 2021-12-31 LAB — BRAIN NATRIURETIC PEPTIDE: B Natriuretic Peptide: 802.2 pg/mL — ABNORMAL HIGH (ref 0.0–100.0)

## 2021-12-31 MED ORDER — SPIRONOLACTONE 25 MG PO TABS
50.0000 mg | ORAL_TABLET | Freq: Every day | ORAL | Status: DC
  Administered 2021-12-31 – 2022-01-03 (×4): 50 mg via ORAL
  Filled 2021-12-31 (×4): qty 2

## 2021-12-31 MED ORDER — POTASSIUM CHLORIDE CRYS ER 20 MEQ PO TBCR
40.0000 meq | EXTENDED_RELEASE_TABLET | Freq: Once | ORAL | Status: AC
  Administered 2021-12-31: 40 meq via ORAL
  Filled 2021-12-31: qty 2

## 2021-12-31 MED ORDER — INSULIN ASPART 100 UNIT/ML IJ SOLN
0.0000 [IU] | Freq: Every day | INTRAMUSCULAR | Status: DC
  Administered 2021-12-31: 3 [IU] via SUBCUTANEOUS
  Administered 2022-01-01 – 2022-01-02 (×2): 5 [IU] via SUBCUTANEOUS
  Administered 2022-01-03: 3 [IU] via SUBCUTANEOUS

## 2021-12-31 MED ORDER — SODIUM CHLORIDE 0.9% IV SOLUTION: Freq: Once | INTRAVENOUS | Status: AC

## 2021-12-31 MED ORDER — CEPHALEXIN 500 MG PO CAPS
500.0000 mg | ORAL_CAPSULE | Freq: Three times a day (TID) | ORAL | Status: AC
  Administered 2021-12-31 – 2022-01-02 (×8): 500 mg via ORAL
  Filled 2021-12-31 (×8): qty 1

## 2021-12-31 MED ORDER — FUROSEMIDE 10 MG/ML IJ SOLN
60.0000 mg | Freq: Once | INTRAMUSCULAR | Status: DC
Start: 2021-12-31 — End: 2021-12-31

## 2021-12-31 MED ORDER — INSULIN GLARGINE-YFGN 100 UNIT/ML ~~LOC~~ SOLN
16.0000 [IU] | Freq: Two times a day (BID) | SUBCUTANEOUS | Status: DC
  Administered 2021-12-31 – 2022-01-02 (×6): 16 [IU] via SUBCUTANEOUS
  Filled 2021-12-31 (×8): qty 0.16

## 2021-12-31 MED ORDER — TAMSULOSIN HCL 0.4 MG PO CAPS
0.4000 mg | ORAL_CAPSULE | Freq: Every day | ORAL | Status: DC
  Administered 2021-12-31 – 2022-01-04 (×5): 0.4 mg via ORAL
  Filled 2021-12-31 (×5): qty 1

## 2021-12-31 MED ORDER — FUROSEMIDE 40 MG PO TABS
40.0000 mg | ORAL_TABLET | Freq: Every day | ORAL | Status: DC
  Administered 2022-01-01 – 2022-01-04 (×4): 40 mg via ORAL
  Filled 2021-12-31 (×4): qty 1

## 2021-12-31 MED ORDER — POTASSIUM CHLORIDE CRYS ER 20 MEQ PO TBCR
20.0000 meq | EXTENDED_RELEASE_TABLET | Freq: Once | ORAL | Status: AC
  Administered 2021-12-31: 20 meq via ORAL
  Filled 2021-12-31: qty 1

## 2021-12-31 MED ORDER — FUROSEMIDE 10 MG/ML IJ SOLN
40.0000 mg | Freq: Once | INTRAMUSCULAR | Status: AC
Start: 2021-12-31 — End: 2021-12-31
  Administered 2021-12-31: 40 mg via INTRAVENOUS
  Filled 2021-12-31: qty 4

## 2021-12-31 MED ORDER — FUROSEMIDE 10 MG/ML IJ SOLN: 20.0000 mg | Freq: Once | INTRAMUSCULAR | Status: DC

## 2021-12-31 MED ORDER — INSULIN ASPART 100 UNIT/ML IJ SOLN
0.0000 [IU] | Freq: Three times a day (TID) | INTRAMUSCULAR | Status: DC
  Administered 2021-12-31 (×3): 7 [IU] via SUBCUTANEOUS
  Administered 2022-01-01: 3 [IU] via SUBCUTANEOUS
  Administered 2022-01-01: 1 [IU] via SUBCUTANEOUS
  Administered 2022-01-01: 3 [IU] via SUBCUTANEOUS
  Administered 2022-01-02 (×2): 7 [IU] via SUBCUTANEOUS
  Administered 2022-01-02: 5 [IU] via SUBCUTANEOUS
  Administered 2022-01-03: 7 [IU] via SUBCUTANEOUS
  Administered 2022-01-03: 5 [IU] via SUBCUTANEOUS
  Administered 2022-01-03: 3 [IU] via SUBCUTANEOUS
  Administered 2022-01-04: 5 [IU] via SUBCUTANEOUS

## 2021-12-31 NOTE — Progress Notes (Addendum)
PROGRESS NOTE                                                                                                                                                                                                             Patient Demographics:    Anna Garcia, is a 41 y.o. female, DOB - 01/09/1981, HQP:[id number]  Outpatient Primary MD for the patient is Day, [name], MD    LOS - 10  Admit date - 12/20/2021    Chief Complaint  Patient presents with   Leg Swelling       Brief Narrative (HPI from H&P)    41 y.o.  female with history of DM-1, gastroparesis, chronic pain syndrome/fibromyalgia, HTN, chronic urinary retention-self caths at home-who was just discharged from this facility on 8/29 after being treated for DKA/AKI/volume overload-presented back to the hospital with anasarca-3+ pitting edema in the lower extremity up to the thighs.  She was found to have SBP in Streptococcus bacteremia.  Further hospital course was complicated by development of encephalopathy and acute hypoxic respiratory failure likely due to aspiration pneumonia.  See below for further details.   Significant events: 8/22-8/29>> hospitalization for DKA/AKI-found to have liver cirrhosis 8/31>> admit for anasarca/significant lower extremity edema. 9/01>> IV albumin started-midodrine dosage escalated. 9/03>> hallucinating-confused. 9/05>> hypoxic after Ativan given for MRI brain-started on Ventimask.  Transfer to progressive care-subsequently titrated down to 3-4 L.   Significant studies: 2/23>>Hbs Ag, HCV ZL:[id number] 8/24>> bilateral lower extremity Doppler: No DVT. 8/28>>ANA/RA/Anti mitrochondrial Ab/Anti smooth muscle LT:[id number] 8/26>> Echo: EF-60-65%. 8/31>> CXR: No PNA 8/31>> CT chest/abdomen/pelvis: No PE, liver cirrhosis, prominent small bowel wall thickening, moderate volume ascites.  Generalized subcutaneous edema. 9/01>> UA: No  proteinuria. 9/02>> 24 urine copper: 21 9/3>> MRI brain: Limited images-but no acute CVA. 9/3>> EEG: No seizures. 9/3>> NH 4: 37 9/3>> vitamin B12: 1027. 9/5>> vitamin B1: Pending 9/6 >> awaiting copper stains.   Significant microbiology data: 9/01>>1/2 blood culture: Streptococcus anginosus 9/01>>ascites fluid culture: No growth 9/02>> blood culture: No growth.   Procedures: 9/01>> diagnostic paracentesis-1 L (WBC 590 w 85% neutrophils) 9/06>> liver biopsy by IR.     Subjective:   Patient in bed, appears comfortable, denies any headache, no fever, no chest pain or pressure, no shortness of breath , no abdominal pain. No focal weakness.   Assessment  & Plan :  Anasarca due to decompensated liver cirrhosis: On Lasix and Aldactone gradually diuresing, still has significant volume overload, blood pressure is still limiting factor continue to monitor on combination of diuretics as above along with midodrine and albumin for blood pressure augmentation.  She still has considerable edema will request nephrology to assist with diuresis as her creatinine is starting to creep up, also aced on fluid restriction.  Of note urine had no proteinuria.   Filed Weights   12/29/21 0500 12/30/21 1024 12/31/21 0511  Weight: 77.6 kg 77.8 kg 76.7 kg     Liver cirrhosis: Diagnosed recently on imaging studies-hepatitis serology/autoimmune work-up negative.  Recent EGD June 2023 did not show any obvious varices.  Suspicion for NASH or Wilson's disease (low ceruloplasmin levels but 24 urine copper not elevated).  Spoke with ophthalmologist-Dr. Patel-9/5-almost impossible to see KF rings without a slit-lamp exam-hence ophthalmology referral will need to be done in the outpatient setting.  Spoke with sister-in-law-[name]-no family history of liver issues that she is aware of.  Underwent liver biopsy on 9/6 - awaiting copper stains.  Thus far nonspecific cirrhotic changes.  Acute hypoxic respiratory failure  likely due to aspiration pneumonia: Significantly better-on room air-last day of Rocephin today-we will be on Flagyl for a few more days.  Avoid benzodiazepines.    Acute metabolic encephalopathy: Likely due to delirium-work-up as above-significantly better over the past few days but still not at baseline. Although has hx of some ETOH use-not felt to have etoh withdrawal as she was readmitted within 1-2 days of discharge. No further recommendations from neurology-Dr. Bhagat-recommends continuing supportive care.  Continue Seroquel/melatonin.  No focal deficits no headache.   History of chronic urinary retention-s/p self-catheterization at home: Due to worsening encephalopathy-significant urinary retention-indwelling Foley catheter placed on 9/4-however this was pulled out by the patient a couple of nights ago.  Since mental status has improved-continue frequent bladder scans and in/out catheterization accordingly.   Streptococcus bacteremia/spontaneous bacterial peritonitis: Afebrile-clinically improved-last day of Rocephin 12/28/21, discussed with ID Dr. Linus Salmons on 12/31/2021 on SBP prophylaxis duration and course, since 1 out of 2 blood cultures grew strep Keflex which should suffice for SBP prophylaxis specially with rising creatinine, if Keflex fails then Cipro can be considered.  Remains on midodrine for soft blood pressure-was on IV albumin for several days during the earlier part of the hospital stay.   Cellulitis right foot: Improved-continue IV antibiotics, now on 5 days of doxycycline starting 12/29/2021.  Wound care continue.   AKI: Likely mild hemodynamically mediated kidney injury-cautiously continue with diuretics as she remains volume overloaded.    Hypokalemia: Replete and recheck.   Macrocytic anemia: S/p 1 unit of PRBC on 9/4-no evidence of hemolysis (LDH normal)-no evidence of blood loss.  Suspect this could be related to acute illness and frequent blood draws, 1 more unit of packed RBC  transfusion on 12/31/2021 continue to monitor.   History of esophageal stricture: S/p dilatation June 2023.  Denies any vomiting or dysphagia symptoms.   History of diabetic gastroparesis: No vomiting-appears stable-continue Reglan.  Encourage small portion meals.  HLD: Continue statin   Moderate persistent asthma: Not in flare-continue bronchodilators.  Chronic pain/fibromyalgia/neuropathy: Continue Neurontin.   DM-1 (A1c 7.6 on 7/30): On Semglee and sliding scale dose adjusted on 12/31/2021.  CBG (last 3)  Recent Labs    12/30/21 2130 12/31/21 0223 12/31/21 0752  GLUCAP 375* 324* 301*   Lab Results  Component Value Date   HGBA1C 7.6 (H) 11/18/2021  Condition - Extremely Guarded  Family Communication  : None present  Code Status :  Full  Consults  :  GI, IR, ID  PUD Prophylaxis : PPI   Procedures  :            Disposition Plan  :    Status is: Inpatient  DVT Prophylaxis  :    enoxaparin (LOVENOX) injection 40 mg Start: 12/21/21 1000   Lab Results  Component Value Date   PLT 104 (L) 12/31/2021    Diet :  Diet Order             Diet heart healthy/carb modified Room service appropriate? No; Fluid consistency: Thin; Fluid restriction: 1200 mL Fluid  Diet effective now                    Inpatient Medications  Scheduled Meds:  dicyclomine  10 mg Oral TID AC & HS   doxycycline  100 mg Oral Q12H   enoxaparin (LOVENOX) injection  40 mg Subcutaneous Q24H   famotidine  40 mg Oral BID   feeding supplement  237 mL Oral BID BM   folic acid  2 mg Oral Daily   furosemide  40 mg Intravenous Once   [START ON 01/01/2022] furosemide  40 mg Oral Daily   gabapentin  300 mg Oral BID   insulin aspart  0-5 Units Subcutaneous QHS   insulin aspart  0-9 Units Subcutaneous TID WC   insulin glargine-yfgn  16 Units Subcutaneous BID   lactulose  10 g Oral BID   lipase/protease/amylase  36,000 Units Oral TID WC   melatonin  5 mg Oral QHS   metoCLOPramide   10 mg Oral TID AC   midodrine  10 mg Oral TID WC   mometasone-formoterol  2 puff Inhalation BID   montelukast  10 mg Oral QHS   multivitamin with minerals  1 tablet Oral Daily   pantoprazole  40 mg Oral Daily   potassium chloride  20 mEq Oral Once   QUEtiapine  50 mg Oral QHS   rosuvastatin  20 mg Oral QHS   sodium chloride flush  3 mL Intravenous Q12H   spironolactone  50 mg Oral Daily   triamcinolone   Topical BID   Continuous Infusions:  albumin human Stopped (12/30/21 2324)   PRN Meds:.acetaminophen, albuterol, alum & mag hydroxide-simeth, influenza vac split quadrivalent PF, loperamide, naLOXone (NARCAN)  injection, mouth rinse, traMADol  Time Spent in minutes  30   Lala Lund M.D on 12/31/2021 at 8:19 AM  To page go to [email or web address]   [facility] -  Office  [phone]  See all Orders from today for further details    Objective:   Vitals:   12/31/21 0519 12/31/21 0602 12/31/21 0757 12/31/21 0817  BP: 108/67 105/63 110/62   Pulse: 92 87 91   Resp: '19 18 19   '$ Temp: 98 F (36.7 C) 98.4 F (36.9 C) 98 F (36.7 C)   TempSrc: Oral Oral Oral   SpO2: 98% 91%  93%  Weight:      Height:        Wt Readings from Last 3 Encounters:  12/31/21 76.7 kg  12/12/21 89.1 kg  12/07/21 84 kg     Intake/Output Summary (Last 24 hours) at 12/31/2021 0819 Last data filed at 12/31/2021 0600 Gross per 24 hour  Intake 1010 ml  Output 1650 ml  Net -640 ml     Physical Exam  Awake but minimally  confused, 3+ leg edema, right foot dorsal aspect skin under bandage, no focal deficits,  [place].AT,PERRAL Supple Neck, No JVD,   Symmetrical Chest wall movement, Good air movement bilaterally, CTAB RRR,No Gallops, Rubs or new Murmurs,  +ve B.Sounds, Abd Soft, No tenderness,   No Cyanosis    Data Review:    CBC Recent Labs  Lab 12/26/21 0636 12/28/21 0657 12/29/21 0243 12/30/21 0412 12/31/21 0346  WBC 8.5 4.4 6.9 5.2 6.4  HGB 8.2* 7.6* 8.3* 7.2* 7.0*  HCT 25.7*  23.8* 26.0* 21.7* 21.2*  PLT 126* 117* 137* 101* 104*  MCV 103.6* 104.8* 104.8* 103.3* 102.4*  MCH 33.1 33.5 33.5 34.3* 33.8  MCHC 31.9 31.9 31.9 33.2 33.0  RDW 23.0* 22.4* 22.3* 22.0* 22.9*  LYMPHSABS  --   --   --  1.9 2.0  MONOABS  --   --   --  0.5 0.6  EOSABS  --   --   --  0.1 0.1  BASOSABS  --   --   --  0.1 0.1    Electrolytes Recent Labs  Lab 12/25/21 0235 12/26/21 0636 12/27/21 0717 12/27/21 0724 12/28/21 0657 12/29/21 0243 12/30/21 0412 12/31/21 0346  NA 147* 144  --  141 141 136 137 136  K 3.6 3.2*  --  2.6* 2.9* 4.0 3.5 3.2*  CL 109 109  --  106 107 105 105 105  CO2 25 23  --  22 21* 19* 20* 21*  GLUCOSE 95 176*  --  319* 301* 245* 308* 306*  BUN <5* <5*  --  '7 7 8 6 '$ <5*  CREATININE 1.19* 1.22*  --  1.27* 1.19* 1.24* 1.16* 1.34*  CALCIUM 9.8 9.4  --  8.8* 9.1 9.2 9.7 10.0  AST  --  40  --   --   --  70* 51* 46*  ALT  --  16  --   --   --  '22 19 17  '$ ALKPHOS  --  89  --   --   --  137* 133* 137*  BILITOT  --  2.1*  --   --   --  1.1 1.3* 1.5*  ALBUMIN  --  4.0  --   --   --  4.0 4.6 4.7  MG 2.3  --   --  2.0 2.1 2.0 2.1 1.9  PROCALCITON 0.50 0.46 0.97  --   --   --   --   --   INR  --  1.5*  --   --   --   --   --   --   AMMONIA 35 42*  --   --   --   --   --   --   BNP  --   --   --   --   --   --  650.8* 802.2*    ------------------------------------------------------------------------------------------------------------------ No results for input(s): "CHOL", "HDL", "LDLCALC", "TRIG", "CHOLHDL", "LDLDIRECT" in the last 72 hours.  Lab Results  Component Value Date   HGBA1C 7.6 (H) 11/18/2021    No results for input(s): "TSH", "T4TOTAL", "T3FREE", "THYROIDAB" in the last 72 hours.  Invalid input(s): "FREET3" ------------------------------------------------------------------------------------------------------------------ ID Labs Recent Labs  Lab 12/25/21 0235 12/26/21 0636 12/27/21 0717 12/27/21 0724 12/28/21 0657 12/29/21 0243 12/30/21 0412  12/31/21 0346  WBC 7.3 8.5  --   --  4.4 6.9 5.2 6.4  PLT 159 126*  --   --  117* 137* 101* 104*  PROCALCITON 0.50 0.46  0.97  --   --   --   --   --   CREATININE 1.19* 1.22*  --  1.27* 1.19* 1.24* 1.16* 1.34*    Radiology Reports No results found.

## 2021-12-31 NOTE — Progress Notes (Signed)
Occupational Therapy Treatment Patient Details Name: Anna Garcia MRN: [id number] DOB: Nov 21, 1980 Today's Date: 12/31/2021   History of present illness Pt is a 41 y/o female admitted secondary to LE swelling, RLE cellulitis, and liver cirrhosis. Liver biopsy 9/6. PMH includes DM, alcohol abuse, fibromyalgia, chronic pain syndrome, HTN, chronic urinary retention with self caths.   OT comments  Patient continues to make steady progress towards goals in skilled OT session. Patient's session encompassed education with regard to energy conservation per patient request. Patient with improved cognition noted as patient was able to walk OT through multiple ADL activities she completes at her baseline and work through strategies to promote energy conservation. Goal made for recital of energy conservation strategies, and patient advocating wanting to practice tub shower transfers next session. OT will continue to follow acutely.    Recommendations for follow up therapy are one component of a multi-disciplinary discharge planning process, led by the attending physician.  Recommendations may be updated based on patient status, additional functional criteria and insurance authorization.    Follow Up Recommendations  No OT follow up    Assistance Recommended at Discharge Set up Supervision/Assistance  Patient can return home with the following  A little help with walking and/or transfers;A little help with bathing/dressing/bathroom;Assistance with cooking/housework;Assist for transportation   Equipment Recommendations  BSC/3in1    Recommendations for Other Services      Precautions / Restrictions Precautions Precautions: Fall Restrictions Weight Bearing Restrictions: No       Mobility Bed Mobility               General bed mobility comments: up in chair upon OT arrival    Transfers                         Balance                                            ADL either performed or assessed with clinical judgement   ADL Overall ADL's : Needs assistance/impaired                                       General ADL Comments: Session focus on energy conservation strategies per patient's request    Extremity/Trunk Assessment              Vision       Perception     Praxis      Cognition Arousal/Alertness: Awake/alert Behavior During Therapy: WFL for tasks assessed/performed Overall Cognitive Status: Impaired/Different from baseline Area of Impairment: Problem solving                             Problem Solving: Slow processing General Comments: patient more alert and appropriate, able to talk about chores at home and her routine, minimal slow processing noted when attempting to strategize energy conservation techniques        Exercises      Shoulder Instructions       General Comments      Pertinent Vitals/ Pain       Pain Assessment Pain Assessment: Faces Faces Pain Scale: No hurt  Home Living  Prior Functioning/Environment              Frequency  Min 2X/week        Progress Toward Goals  OT Goals(current goals can now be found in the care plan section)  Progress towards OT goals: Progressing toward goals  Acute Rehab OT Goals Patient Stated Goal: get better OT Goal Formulation: With patient Time For Goal Achievement: 01/04/22 Potential to Achieve Goals: Good  Plan Discharge plan remains appropriate    Co-evaluation                 AM-PAC OT "6 Clicks" Daily Activity     Outcome Measure   Help from another person eating meals?: None Help from another person taking care of personal grooming?: None Help from another person toileting, which includes using toliet, bedpan, or urinal?: A Little Help from another person bathing (including washing, rinsing, drying)?: A Little Help from another person to  put on and taking off regular upper body clothing?: None Help from another person to put on and taking off regular lower body clothing?: A Little 6 Click Score: 21    End of Session Equipment Utilized During Treatment: Other (comment) (Energy conservation handout)  OT Visit Diagnosis: Unsteadiness on feet (R26.81);History of falling (Z91.81);Pain   Activity Tolerance Patient tolerated treatment well   Patient Left in bed;with call bell/phone within reach;with nursing/sitter in room   Nurse Communication Mobility status        Time: 2440-1027 OT Time Calculation (min): 15 min  Charges: OT General Charges $OT Visit: 1 Visit OT Treatments $Self Care/Home Management : 8-22 mins  Anna Garcia, OTR/L Acute Rehabilitation Services [phone]   Ascencion Dike 12/31/2021, 2:52 PM

## 2021-12-31 NOTE — Progress Notes (Signed)
Physical Therapy Discharge Patient Details Name: Anna Garcia MRN: [id number] DOB: 1980/08/10 Today's Date: 12/31/2021 Time: 7127-8718 PT Time Calculation (min) (ACUTE ONLY): 10 min  Patient discharged from PT services secondary to goals met and no further PT needs identified.  Please see latest therapy progress note for current level of functioning and progress toward goals.    Progress and discharge plan discussed with patient and/or caregiver: Patient/Caregiver agrees with plan  GP     Shary Decamp [facility] 12/31/2021, 3:20 PM  [place] Office [phone]

## 2021-12-31 NOTE — Progress Notes (Signed)
Patient bladder scanned for 425cc of urine.  Assisted to [facility] and patient unable to urinate.  Dr. Candiss Norse notified, order to insert foley if patient unable to self catheterize.  Patient then voided 800cc of urine on BSC.  Will continue to monitor U/O.

## 2021-12-31 NOTE — Progress Notes (Addendum)
Attending physician's note   I have reviewed the chart and discussed her care on rounds. I performed a substantive portion of this encounter, including complete performance of at least one of the key components, in conjunction with the APP. I agree with the APP's note, impression, and recommendations with my edits.   Awaiting copper staining from liver biopsy along with PEth panel.  Currently on Keflex, which should provide SBP prophylaxis. Monitor BMP given initiation of Aldactone today and starting Lasix tomorrow (was given IV Lasix today and will start oral tomorrow).  Still on midodrine for BP support and has been treated with IV albumin earlier on admission.  [place], DO, FACG [phone] office                Daily Rounding Note  12/31/2021, 10:00 AM  LOS: 10 days   SUBJECTIVE:   Chief complaint:    Decompensated cirrhosis.  BPs hypotensive at times to 90s/50s up to lo 100s/60s.  Room air sats low to upper 90s.  No fevers.  No tachycardia.  Nausea improved.  Her biggest complaint is of swelling and pain/discomfort in her lower legs.  Denies difficulty breathing.   Bedside sitter in the room as patient has had periods of agitation with attempts to pull out lines.  Current sitter says the patient has been more groggy today.  OBJECTIVE:         Vital signs in last 24 hours:    Temp:  [97.8 F (36.6 C)-99 F (37.2 C)] 97.8 F (36.6 C) (09/11 0952) Pulse Rate:  [87-95] 94 (09/11 0952) Resp:  [15-20] 15 (09/11 0952) BP: (98-110)/(51-67) 108/67 (09/11 0952) SpO2:  [91 %-98 %] 93 % (09/11 0817) Weight:  [76.7 kg-77.8 kg] 76.7 kg (09/11 0511) Last BM Date : 12/29/21 Filed Weights   12/29/21 0500 12/30/21 1024 12/31/21 0511  Weight: 77.6 kg 77.8 kg 76.7 kg   General: Pale, ill-appearing.  Psychomotor slowing. Heart: RRR. Chest: No labored breathing or cough.  Lungs clear with poor inspiratory effort. Abdomen:  Soft without tenderness.  No HSM, masses, bruits, hernias Extremities: Bilateral lower extremity edema with reticulated erythema pattern. Neuro/Psych: Oriented to place, self, situation but significant psychomotor slowing and word finding difficulty.  No asterixis.  Intake/Output from previous day: 09/10 0701 - 09/11 0700 In: 1010 [P.O.:960; IV Piggyback:50] Out: 3500 [Urine:1550; Stool:100]  Intake/Output this shift: Total I/O In: 465.9 [I.V.:32.5; Blood:381.7; IV Piggyback:51.7] Out: -   Lab Results: Recent Labs    12/29/21 0243 12/30/21 0412 12/31/21 0346  WBC 6.9 5.2 6.4  HGB 8.3* 7.2* 7.0*  HCT 26.0* 21.7* 21.2*  PLT 137* 101* 104*   BMET Recent Labs    12/29/21 0243 12/30/21 0412 12/31/21 0346  NA 136 137 136  K 4.0 3.5 3.2*  CL 105 105 105  CO2 19* 20* 21*  GLUCOSE 245* 308* 306*  BUN 8 6 <5*  CREATININE 1.24* 1.16* 1.34*  CALCIUM 9.2 9.7 10.0   LFT Recent Labs    12/29/21 0243 12/30/21 0412 12/31/21 0346  PROT 6.4* 6.6 6.6  ALBUMIN 4.0 4.6 4.7  AST 70* 51* 46*  ALT '22 19 17  '$ ALKPHOS 137* 133* 137*  BILITOT 1.1 1.3* 1.5*   PT/INR No results for input(s): "LABPROT", "INR" in the last 72 hours. Hepatitis Panel No results for input(s): "HEPBSAG", "HCVAB", "HEPAIGM", "HEPBIGM" in the last 72 hours.  Studies/Results: No results found.  Scheduled Meds:  dicyclomine  10 mg Oral TID AC &  HS   doxycycline  100 mg Oral Q12H   enoxaparin (LOVENOX) injection  40 mg Subcutaneous Q24H   famotidine  40 mg Oral BID   feeding supplement  237 mL Oral BID BM   folic acid  2 mg Oral Daily   furosemide  40 mg Intravenous Once   [START ON 01/01/2022] furosemide  40 mg Oral Daily   gabapentin  300 mg Oral BID   insulin aspart  0-5 Units Subcutaneous QHS   insulin aspart  0-9 Units Subcutaneous TID WC   insulin glargine-yfgn  16 Units Subcutaneous BID   lactulose  10 g Oral BID   lipase/protease/amylase  36,000 Units Oral TID WC   melatonin  5 mg Oral QHS    metoCLOPramide  10 mg Oral TID AC   midodrine  10 mg Oral TID WC   mometasone-formoterol  2 puff Inhalation BID   montelukast  10 mg Oral QHS   multivitamin with minerals  1 tablet Oral Daily   pantoprazole  40 mg Oral Daily   potassium chloride  20 mEq Oral Once   QUEtiapine  50 mg Oral QHS   rosuvastatin  20 mg Oral QHS   sodium chloride flush  3 mL Intravenous Q12H   spironolactone  50 mg Oral Daily   triamcinolone   Topical BID   Continuous Infusions: PRN Meds:.acetaminophen, albuterol, alum & mag hydroxide-simeth, influenza vac split quadrivalent PF, loperamide, naLOXone (NARCAN)  injection, mouth rinse, traMADol   ASSESMENT:   Decompensated cirrhosis.  Likely etiology alcohol and metabolic steatohepatitis.  No definite diagnosis Wilson's disease.  Urine copper not elevated.  Serum copper below normal.  Liver pathology shows cirrhosis in a background of steatohepatitis.  Mixed macro and microvesicular pattern, bridging fibrosis, extensive bile duct proliferation consistent with cirrhosis.  Mild pericellular/perisinusoidal fibrosis is not prominent.  Stains reveal some ballooning degeneration, rare apoptotic body, rare Mallory Denk body.  Findings can be seen in toxic/metabolic etiologies including alcohol.  Copper stain is pending.    Encephalopathy.  Multifactorial including alcohol withdrawal, acute respiratory failure in setting of aspiration pneumonia, hepatic encephalopathy, nutritional deficiencies/Warnicke's, cannot exclude Wilson's disease.    Aspiration PNA.  Treated through 9/8 w Metronidazole.  Now on doxycycline through 9/14   SBP, ascites.  12/21/21 1 L paracentesis: + for SBP, treatement w Rocephin...   Acute on chronic Anemia.  2 PRBCs, 12/24/2021 (Hgb 6.8.. 8.5), 12/31/2021 for Hgb 7. Studies for hereditary hemochromatosis wer negative (HFE genotype [id number], HFE Genotype [id number])    Gastroparesis in pt w IDDM. Normal GES but it was in 2002.  09/2021 EGD: [facility].   Benign-appearing intrinsic stenosis, traversable.  Savary dilated to 16 mm.  Minimal gastritis.  Normal duodenum to third portion.  Dexilant, famotidine at home    Dysphagia.  Esoph mano and esoph Ph studies at [place] 10/2015 (not able to see report but later notres state "pathologic GERD".  09/2021 EGD: [facility].  Benign-appearing intrinsic stenosis, traversable.  Savary dilated to 16 mm.  Minimal gastritis.  Normal duodenum to third portion.    IDDM w admissions for DKA.  Diagnosed with diabetes at age 14.  Latest A1c was on 7/30, 7.6.    AMS w highest ammonia 37 eight days ago.  Chronulac since 9/3.  On folic acid.      Altered bowel habits.  Motegrity daily at home. Also Imodium prn at home.  Now on Lactulose.  Creon AC PTA.     PLAN  Continue supportive care.  I do not have a broad exposure to this patient but between this week and last weeks visits, I wonder if she is safe to return to home living alone in [place] with closest relatives in [place].    Need for ongoing Midodrine?     Note that attending hospitalist started Aldactone 50 mg today, scheduled Reglan on 9/1 (use prn PTA).  Wonder if we ought to down titrate Reglan or return it to prn status.      Stopped Bentyl.      When to add SBP prophylaxis (Bactrim daily starting Saturday 9/2 was rec per ID)?      Brennan Karam  12/31/2021, 10:00 AM Phone [phone]

## 2021-12-31 NOTE — Progress Notes (Signed)
Refused bladder scan. Education given.

## 2021-12-31 NOTE — Progress Notes (Signed)
1 unit PRBCs ordered to be transfused for hemoglobin of 7.0 K.  Potassium repleted orally, KCl 40 mEq x 1 for serum potassium of 3.2.  Serum magnesium 1.9.

## 2021-12-31 NOTE — Progress Notes (Signed)
Due to the risk for congestion as evidenced by generalized edema, consulted C. Hall,MD to regulate BT at 100 ml/hr. Physician agreed. To give Lasix IV post BT.

## 2021-12-31 NOTE — Progress Notes (Signed)
Physical Therapy Treatment Patient Details Name: Anna Garcia MRN: [id number] DOB: 11-05-1980 Today's Date: 12/31/2021   History of Present Illness Pt is a 41 y/o female admitted secondary to LE swelling, RLE cellulitis, and liver cirrhosis. Liver biopsy 9/6. PMH includes DM, alcohol abuse, fibromyalgia, chronic pain syndrome, HTN, chronic urinary retention with self caths.    PT Comments    Pt doing well with mobility. Ambulation slowed due to leg edema but steady and doesn't require assistance. Will DC from PT with goals met. Will ask mobility specialist to follow to make sure she continues to mobilize while here.    Recommendations for follow up therapy are one component of a multi-disciplinary discharge planning process, led by the attending physician.  Recommendations may be updated based on patient status, additional functional criteria and insurance authorization.  Follow Up Recommendations        Assistance Recommended at Discharge PRN  Patient can return home with the following     Equipment Recommendations  None recommended by PT    Recommendations for Other Services       Precautions / Restrictions Precautions Precautions: None Restrictions Weight Bearing Restrictions: No     Mobility  Bed Mobility               General bed mobility comments: Pt up in chair    Transfers Overall transfer level: Modified independent Equipment used: None Transfers: Sit to/from Stand Sit to Stand: Modified independent (Device/Increase time)           General transfer comment: Initially slow to transition. Improved with repetitions    Ambulation/Gait Ambulation/Gait assistance: Modified independent (Device/Increase time) Gait Distance (Feet): 470 Feet Assistive device: None Gait Pattern/deviations: Step-through pattern, Decreased stride length Gait velocity: decr Gait velocity interpretation: 1.31 - 2.62 ft/sec, indicative of limited community ambulator   General  Gait Details: Steady gait without assistive device   Stairs             Wheelchair Mobility    Modified Rankin (Stroke Patients Only)       Balance Overall balance assessment: Mild deficits observed, not formally tested                                          Cognition Arousal/Alertness: Awake/alert Behavior During Therapy: WFL for tasks assessed/performed Overall Cognitive Status: Impaired/Different from baseline Area of Impairment: Problem solving                             Problem Solving: Slow processing          Exercises      General Comments General comments (skin integrity, edema, etc.): VSS on RA      Pertinent Vitals/Pain Pain Assessment Pain Assessment: Faces Faces Pain Scale: No hurt    Home Living                          Prior Function            PT Goals (current goals can now be found in the care plan section) Acute Rehab PT Goals Patient Stated Goal: get swelling down in legs Progress towards PT goals: Goals met/education completed, patient discharged from PT    Frequency           PT Plan Discharge plan needs  to be updated    Co-evaluation              AM-PAC PT "6 Clicks" Mobility   Outcome Measure  Help needed turning from your back to your side while in a flat bed without using bedrails?: None Help needed moving from lying on your back to sitting on the side of a flat bed without using bedrails?: None Help needed moving to and from a bed to a chair (including a wheelchair)?: None Help needed standing up from a chair using your arms (e.g., wheelchair or bedside chair)?: None Help needed to walk in hospital room?: None Help needed climbing 3-5 steps with a railing? : A Little 6 Click Score: 23    End of Session   Activity Tolerance: Patient tolerated treatment well Patient left: with call bell/phone within reach;with nursing/sitter in room;in chair Nurse  Communication: Mobility status PT Visit Diagnosis: History of falling (Z91.81);Difficulty in walking, not elsewhere classified (R26.2)     Time: 9485-4627 PT Time Calculation (min) (ACUTE ONLY): 10 min  Charges:  $Gait Training: 8-22 mins                     [place] Office [place] 12/31/2021, 3:17 PM

## 2021-12-31 NOTE — Progress Notes (Signed)
CSW received consult for substance use for patient. CSW spoke with patient at bedside. CSW offered patient outpatient substance use treatment services resources. Patient accepted. All questions answered. No further questions reported at this time.

## 2021-12-31 NOTE — Consult Note (Addendum)
[facility]  INPATIENT CONSULTATION  Reason for Consultation: edema Requesting Provider: Dr. Candiss Norse  HPI: Anna Garcia is an 41 y.o. female with type 1 DM, gastroparesis, GERD< fibromyalgia, macrocytic anemia, chronic pain, recent dx cirrhosis (unclear etiology) who is seen for evaluation and management of anasarca and AKI.    Admitted 12/21/21 with LE edema.  CTA chest and A/P; Neg for PE. + ascites. Normal kidneys.  Has known bladder dysfunction, does I/O caths as needed (can spont void some). Cr typically normal 0.8-0.9, on admission 1.2, now 1.3.  During admission she's been on lasix 40 po daily and that was changed to 40 IV this AM x 1 dose, spironolactone 100 daily was reduced to 50 daily to start this AM.  She's on midodrine 10 TID for BP support. Albumin IV TID ordered 9/9 - serum albumin has gone from mid 2s to 4.6 this AM.  Net neg 16.1L for admission based on I/Os. Wt down 10kg during admission. to 76.7kg this AM.  UA 9/1 neg protein.  Hb 7s, typically in the 8s.   Recently admitted 8/22 -29: new cirrhosis dx, "severe vol OL" EF normal, indeterminant diastolic function.  ARB stopped, midodrine 5 TID , no diuretic.  8/15-22 admission: DKA. 7/29 - 8/1 N/V.   PMH: Past Medical History:  Diagnosis Date   Diabetes mellitus    Gastroparesis    Hypertension    Neuropathy   Cirrhosis - recent dx.  W/u pending.  Liver biopsy pending.   PSH: Past Surgical History:  Procedure Laterality Date   ESOPHAGOGASTRODUODENOSCOPY (EGD) WITH PROPOFOL N/A 09/24/2021   Procedure: ESOPHAGOGASTRODUODENOSCOPY (EGD) WITH PROPOFOL;  Surgeon: Clarene Essex, MD;  Location: [facility];  Service: Gastroenterology;  Laterality: N/A;   IR PARACENTESIS  12/21/2021   IR US GUIDE BX ASP/DRAIN  12/26/2021   SAVORY DILATION N/A 09/24/2021   Procedure: SAVORY DILATION - 16 MM;  Surgeon: Clarene Essex, MD;  Location: [facility];  Service: Gastroenterology;  Laterality: N/A;    Past Medical History:  Diagnosis  Date   Diabetes mellitus    Gastroparesis    Hypertension    Neuropathy     Medications:  I have reviewed the patient's current medications.  Medications Prior to Admission  Medication Sig Dispense Refill   DEXILANT 60 MG capsule Take 1 capsule (60 mg total) by mouth daily. 30 capsule 0   dicyclomine (BENTYL) 10 MG capsule Take 1 capsule (10 mg total) by mouth 4 (four) times daily -  before meals and at bedtime. 120 capsule 1   famotidine (PEPCID) 40 MG tablet Take 40 mg by mouth 2 (two) times daily.     ferrous sulfate 325 (65 FE) MG tablet Take 1 tablet by mouth daily with breakfast. 30 tablet 1   gabapentin (NEURONTIN) 300 MG capsule Take 1 capsule (300 mg total) by mouth 3 (three) times daily. 90 capsule 1   granisetron (KYTRIL) 1 MG tablet Take 1 mg by mouth every 12 (twelve) hours.     insulin glargine (LANTUS SOLOSTAR) 100 UNIT/ML Solostar Pen Inject 8 Units into the skin 2 times daily. 15 mL 0   insulin glulisine (APIDRA SOLOSTAR) 100 UNIT/ML Solostar Pen Inject 0 - 8 Units into the skin daily AS NEEDED if blood glucose levels are trending upwards- changes as per carb intake (Patient taking differently: Inject 0-8 Units into the skin daily as needed (for glucose levels are trending upward- changes carbs intake).) 15 mL 1   lipase/protease/amylase (CREON) 36000 UNITS  CPEP capsule Take 1 capsule (36,000 Units total) by mouth 3 (three) times daily with meals. 270 capsule 1   loperamide (IMODIUM) 2 MG capsule Take 1 capsule (2 mg total) by mouth as needed for diarrhea or loose stools. (Patient taking differently: Take 2 mg by mouth daily as needed for diarrhea or loose stools.) 30 capsule 0   metoCLOPramide (REGLAN) 10 MG tablet Take 1 tablet by mouth every 6 hours as needed for nausea. 20 tablet 0   mometasone-formoterol (DULERA) 100-5 MCG/ACT AERO Inhale 2 puffs into the lungs 2 (two) times daily. 1 each 1   montelukast (SINGULAIR) 10 MG tablet Take 10 mg by mouth at bedtime.      MOTEGRITY 1 MG TABS Take 1 mg by mouth daily.     rosuvastatin (CRESTOR) 20 MG tablet Take 20 mg by mouth at bedtime.     BAQSIMI TWO PACK 3 MG/DOSE POWD Place 1 spray into both nostrils as needed (for a very low blood sugar emergency, when unable to eat or drink and need help from someone else- or as otherwise directed).     diphenhydrAMINE (BENADRYL ALLERGY) 25 mg capsule Take 1 capsule (25 mg total) by mouth every 6 (six) hours as needed. (Patient not taking: Reported on 12/21/2021) 30 capsule 0   hydrocortisone cream 0.5 % Apply 1 Application topically 2 (two) times daily. Apply on rash on hands (Patient not taking: Reported on 12/21/2021) 30 g 0    ALLERGIES:  No Known Allergies  FAM HX: Family History  Problem Relation Age of Onset   Hypertension Mother    Dementia Mother    Heart attack Father    Hypertension Father    Heart disease Father    Hypertension Brother    Stroke Maternal Grandfather     Social History:   reports that she has quit smoking. Her smoking use included cigarettes. She smoked an average of .5 packs per day. She has never used smokeless tobacco. She reports that she does not drink alcohol and does not use drugs.  ROS: 12 system ROS neg except per HPI above  Blood pressure 110/62, pulse 91, temperature 98 F (36.7 C), temperature source Oral, resp. rate 19, height '5\' 4"'$  (1.626 m), weight 76.7 kg, SpO2 93 %. PHYSICAL EXAM: Gen: chronically ill appearing woman sitting up in bed comfortably Eyes: anicteric ENT: MMM CV:  RRR Abd:  soft, mildly distended Lungs: clear GU: no foley Extr: 2+ pitting edema to thighs Neuro: AOx3 Skin: tibial skin is erythematous but not warm or fluctuant   Results for orders placed or performed during the hospital encounter of 12/20/21 (from the past 48 hour(s))  Glucose, capillary     Status: Abnormal   Collection Time: 12/29/21 12:11 PM  Result Value Ref Range   Glucose-Capillary 297 (H) 70 - 99 mg/dL    Comment: Glucose  reference range applies only to samples taken after fasting for at least 8 hours.  Glucose, capillary     Status: Abnormal   Collection Time: 12/29/21  4:27 PM  Result Value Ref Range   Glucose-Capillary 383 (H) 70 - 99 mg/dL    Comment: Glucose reference range applies only to samples taken after fasting for at least 8 hours.  Glucose, capillary     Status: Abnormal   Collection Time: 12/29/21 10:01 PM  Result Value Ref Range   Glucose-Capillary 413 (H) 70 - 99 mg/dL    Comment: Glucose reference range applies only to samples taken after fasting  for at least 8 hours.   Comment 1 Notify RN   Glucose, capillary     Status: Abnormal   Collection Time: 12/29/21 10:39 PM  Result Value Ref Range   Glucose-Capillary 413 (H) 70 - 99 mg/dL    Comment: Glucose reference range applies only to samples taken after fasting for at least 8 hours.  Magnesium     Status: None   Collection Time: 12/30/21  4:12 AM  Result Value Ref Range   Magnesium 2.1 1.7 - 2.4 mg/dL    Comment: Performed at [facility] [address] [place], [place] [address]  Comprehensive metabolic panel     Status: Abnormal   Collection Time: 12/30/21  4:12 AM  Result Value Ref Range   Sodium 137 135 - 145 mmol/L   Potassium 3.5 3.5 - 5.1 mmol/L   Chloride 105 98 - 111 mmol/L   CO2 20 (L) 22 - 32 mmol/L   Glucose, Bld 308 (H) 70 - 99 mg/dL    Comment: Glucose reference range applies only to samples taken after fasting for at least 8 hours.   BUN 6 6 - 20 mg/dL   Creatinine, Ser 1.16 (H) 0.44 - 1.00 mg/dL   Calcium 9.7 8.9 - 10.3 mg/dL   Total Protein 6.6 6.5 - 8.1 g/dL   Albumin 4.6 3.5 - 5.0 g/dL   AST 51 (H) 15 - 41 U/L   ALT 19 0 - 44 U/L   Alkaline Phosphatase 133 (H) 38 - 126 U/L   Total Bilirubin 1.3 (H) 0.3 - 1.2 mg/dL   GFR, Estimated >60 >60 mL/min    Comment: (NOTE) Calculated using the CKD-EPI Creatinine Equation (2021)    Anion gap 12 5 - 15    Comment: Performed at [facility], [place] [address] [place], [place] [address]  CBC with Differential/Platelet     Status: Abnormal   Collection Time: 12/30/21  4:12 AM  Result Value Ref Range   WBC 5.2 4.0 - 10.5 K/uL   RBC 2.10 (L) 3.87 - 5.11 MIL/uL   Hemoglobin 7.2 (L) 12.0 - 15.0 g/dL   HCT 21.7 (L) 36.0 - 46.0 %   MCV 103.3 (H) 80.0 - 100.0 fL   MCH 34.3 (H) 26.0 - 34.0 pg   MCHC 33.2 30.0 - 36.0 g/dL   RDW 22.0 (H) 11.5 - 15.5 %   Platelets 101 (L) 150 - 400 K/uL    Comment: REPEATED TO VERIFY   nRBC 0.0 0.0 - 0.2 %   Neutrophils Relative % 52 %   Neutro Abs 2.6 1.7 - 7.7 K/uL   Lymphocytes Relative 36 %   Lymphs Abs 1.9 0.7 - 4.0 K/uL   Monocytes Relative 10 %   Monocytes Absolute 0.5 0.1 - 1.0 K/uL   Eosinophils Relative 1 %   Eosinophils Absolute 0.1 0.0 - 0.5 K/uL   Basophils Relative 1 %   Basophils Absolute 0.1 0.0 - 0.1 K/uL   WBC Morphology MORPHOLOGY UNREMARKABLE    RBC Morphology See Note    Smear Review Normal platelet morphology    Immature Granulocytes 0 %   Abs Immature Granulocytes 0.02 0.00 - 0.07 K/uL   Tear Drop Cells PRESENT    Polychromasia PRESENT     Comment: Performed at [facility], [place] [address], [place], [place] [address]  Brain natriuretic peptide     Status: Abnormal   Collection Time: 12/30/21  4:12 AM  Result Value Ref  Range   B Natriuretic Peptide 650.8 (H) 0.0 - 100.0 pg/mL    Comment: Performed at [facility] [address] [place], [place] [address]  Type and screen     Status: None (Preliminary result)   Collection Time: 12/30/21  7:14 AM  Result Value Ref Range   ABO/RH(D) O POS    Antibody Screen NEG    Sample Expiration 01/02/2022,2359    Unit Number [id number]    Blood Component Type RED CELLS,LR    Unit division 00    Status of Unit ISSUED    Transfusion Status OK TO TRANSFUSE    Crossmatch Result      Compatible Performed at [facility], [place] [address], [place], [place] [address]   Glucose, capillary     Status: Abnormal   Collection  Time: 12/30/21  8:20 AM  Result Value Ref Range   Glucose-Capillary 292 (H) 70 - 99 mg/dL    Comment: Glucose reference range applies only to samples taken after fasting for at least 8 hours.  Glucose, capillary     Status: Abnormal   Collection Time: 12/30/21 11:24 AM  Result Value Ref Range   Glucose-Capillary 353 (H) 70 - 99 mg/dL    Comment: Glucose reference range applies only to samples taken after fasting for at least 8 hours.  Glucose, capillary     Status: Abnormal   Collection Time: 12/30/21  4:49 PM  Result Value Ref Range   Glucose-Capillary 370 (H) 70 - 99 mg/dL    Comment: Glucose reference range applies only to samples taken after fasting for at least 8 hours.  Glucose, capillary     Status: Abnormal   Collection Time: 12/30/21  9:30 PM  Result Value Ref Range   Glucose-Capillary 375 (H) 70 - 99 mg/dL    Comment: Glucose reference range applies only to samples taken after fasting for at least 8 hours.  Glucose, capillary     Status: Abnormal   Collection Time: 12/31/21  2:23 AM  Result Value Ref Range   Glucose-Capillary 324 (H) 70 - 99 mg/dL    Comment: Glucose reference range applies only to samples taken after fasting for at least 8 hours.  Magnesium     Status: None   Collection Time: 12/31/21  3:46 AM  Result Value Ref Range   Magnesium 1.9 1.7 - 2.4 mg/dL    Comment: Performed at [facility] [address], [place], [place] [address]  Comprehensive metabolic panel     Status: Abnormal   Collection Time: 12/31/21  3:46 AM  Result Value Ref Range   Sodium 136 135 - 145 mmol/L   Potassium 3.2 (L) 3.5 - 5.1 mmol/L   Chloride 105 98 - 111 mmol/L   CO2 21 (L) 22 - 32 mmol/L   Glucose, Bld 306 (H) 70 - 99 mg/dL    Comment: Glucose reference range applies only to samples taken after fasting for at least 8 hours.   BUN <5 (L) 6 - 20 mg/dL   Creatinine, Ser 1.34 (H) 0.44 - 1.00 mg/dL   Calcium 10.0 8.9 - 10.3 mg/dL   Total Protein 6.6 6.5 - 8.1 g/dL    Albumin 4.7 3.5 - 5.0 g/dL   AST 46 (H) 15 - 41 U/L   ALT 17 0 - 44 U/L   Alkaline Phosphatase 137 (H) 38 - 126 U/L   Total Bilirubin 1.5 (H) 0.3 - 1.2 mg/dL   GFR, Estimated 51 (  L) >60 mL/min    Comment: (NOTE) Calculated using the CKD-EPI Creatinine Equation (2021)    Anion gap 10 5 - 15    Comment: Performed at [facility], [place] [address] [place], [place] [address]  CBC with Differential/Platelet     Status: Abnormal   Collection Time: 12/31/21  3:46 AM  Result Value Ref Range   WBC 6.4 4.0 - 10.5 K/uL   RBC 2.07 (L) 3.87 - 5.11 MIL/uL   Hemoglobin 7.0 (L) 12.0 - 15.0 g/dL   HCT 21.2 (L) 36.0 - 46.0 %   MCV 102.4 (H) 80.0 - 100.0 fL   MCH 33.8 26.0 - 34.0 pg   MCHC 33.0 30.0 - 36.0 g/dL   RDW 22.9 (H) 11.5 - 15.5 %   Platelets 104 (L) 150 - 400 K/uL    Comment: REPEATED TO VERIFY   nRBC 0.0 0.0 - 0.2 %   Neutrophils Relative % 55 %   Neutro Abs 3.6 1.7 - 7.7 K/uL   Lymphocytes Relative 32 %   Lymphs Abs 2.0 0.7 - 4.0 K/uL   Monocytes Relative 10 %   Monocytes Absolute 0.6 0.1 - 1.0 K/uL   Eosinophils Relative 2 %   Eosinophils Absolute 0.1 0.0 - 0.5 K/uL   Basophils Relative 1 %   Basophils Absolute 0.1 0.0 - 0.1 K/uL   Immature Granulocytes 0 %   Abs Immature Granulocytes 0.02 0.00 - 0.07 K/uL   Tear Drop Cells PRESENT    Polychromasia PRESENT    Ovalocytes PRESENT     Comment: Performed at [facility], [place] [address] [place], [place] [address]  Brain natriuretic peptide     Status: Abnormal   Collection Time: 12/31/21  3:46 AM  Result Value Ref Range   B Natriuretic Peptide 802.2 (H) 0.0 - 100.0 pg/mL    Comment: Performed at [facility] [address], [place], [place] [address]  Prepare RBC (crossmatch)     Status: None   Collection Time: 12/31/21  5:14 AM  Result Value Ref Range   Order Confirmation      ORDER PROCESSED BY BLOOD BANK Performed at [facility], [address], [place], [place] [address]   Glucose, capillary      Status: Abnormal   Collection Time: 12/31/21  7:52 AM  Result Value Ref Range   Glucose-Capillary 301 (H) 70 - 99 mg/dL    Comment: Glucose reference range applies only to samples taken after fasting for at least 8 hours.    No results found.  Assessment/Plan **Anasarca:  in the setting of newly diagnosed cirrhosis.  No e/o nephrotic syndrome with UA neg for protein.  Has diuresed a good bit but still very volume overloaded.  Albumin was supplemented but serum albumin now 4.6 so will hold doses today.  Continue na/fluid restriction; receiving lasix 40 IV today and spironolactone 50.  Will need to be slow and steady given AKI.  Says compression stockings too painful to wear with neuropathy.  **AKI on CKD:  baseline Cr in the 1-1.2 range, now with Cr 1.3.  Suspect she has a component of underlying DKD now with mild AKI in the setting of diuresis.  UA fairly normal. Does I/O caths PRN - would get a bladder scan to ensure no retention.  2022 renal US was ok, hold of on repeating for now. Still need additonal diuresis, will aim for slow and steady, watching daily labs.  Avoid hypotension (on  midodrine now) and nephrotoxins.   **Cirrhosis: recent dx, etiology unclear.  GI following - liver biopsy being pursued.  **Type 1 DM: per primary.  **Macrocytic anemia:  Hb in 7-8s, has been transfused.    **SBP: streptococcus.  ID has been consulted.  On cephalexin.    **Cellulitis:  on doxycycline for foot cellulitis - watch legs, some erythema noted.   Will follow.  Justin Mend 12/31/2021, 9:07 AM

## 2022-01-01 DIAGNOSIS — R188 Other ascites: Secondary | ICD-10-CM | POA: Diagnosis not present

## 2022-01-01 DIAGNOSIS — K7460 Unspecified cirrhosis of liver: Secondary | ICD-10-CM | POA: Diagnosis not present

## 2022-01-01 LAB — TYPE AND SCREEN
ABO/RH(D): O POS
Antibody Screen: NEGATIVE
Unit division: 0

## 2022-01-01 LAB — BPAM RBC
Blood Product Expiration Date: 202310052359
ISSUE DATE / TIME: 202309110540
Unit Type and Rh: 5100

## 2022-01-01 LAB — CBC WITH DIFFERENTIAL/PLATELET
Abs Immature Granulocytes: 0.02 10*3/uL (ref 0.00–0.07)
Basophils Absolute: 0.1 10*3/uL (ref 0.0–0.1)
Basophils Relative: 1 %
Eosinophils Absolute: 0.1 10*3/uL (ref 0.0–0.5)
Eosinophils Relative: 2 %
HCT: 26.8 % — ABNORMAL LOW (ref 36.0–46.0)
Hemoglobin: 9.1 g/dL — ABNORMAL LOW (ref 12.0–15.0)
Immature Granulocytes: 0 %
Lymphocytes Relative: 28 %
Lymphs Abs: 2 10*3/uL (ref 0.7–4.0)
MCH: 34.3 pg — ABNORMAL HIGH (ref 26.0–34.0)
MCHC: 34 g/dL (ref 30.0–36.0)
MCV: 101.1 fL — ABNORMAL HIGH (ref 80.0–100.0)
Monocytes Absolute: 0.8 10*3/uL (ref 0.1–1.0)
Monocytes Relative: 11 %
Neutro Abs: 4.2 10*3/uL (ref 1.7–7.7)
Neutrophils Relative %: 58 %
Platelets: 98 10*3/uL — ABNORMAL LOW (ref 150–400)
RBC: 2.65 MIL/uL — ABNORMAL LOW (ref 3.87–5.11)
RDW: 23.2 % — ABNORMAL HIGH (ref 11.5–15.5)
Smear Review: NORMAL
WBC: 7.3 10*3/uL (ref 4.0–10.5)
nRBC: 0 % (ref 0.0–0.2)

## 2022-01-01 LAB — COMPREHENSIVE METABOLIC PANEL
ALT: 17 U/L (ref 0–44)
AST: 50 U/L — ABNORMAL HIGH (ref 15–41)
Albumin: 4.3 g/dL (ref 3.5–5.0)
Alkaline Phosphatase: 129 U/L — ABNORMAL HIGH (ref 38–126)
Anion gap: 9 (ref 5–15)
BUN: <5 mg/dL — ABNORMAL LOW (ref 6–20)
CO2: 22 mmol/L (ref 22–32)
Calcium: 10 mg/dL (ref 8.9–10.3)
Chloride: 108 mmol/L (ref 98–111)
Creatinine, Ser: 1.17 mg/dL — ABNORMAL HIGH (ref 0.44–1.00)
GFR, Estimated: >60 mL/min (ref >60)
Glucose, Bld: 245 mg/dL — ABNORMAL HIGH (ref 70–99)
Potassium: 3.5 mmol/L (ref 3.5–5.1)
Sodium: 139 mmol/L (ref 135–145)
Total Bilirubin: 2.1 mg/dL — ABNORMAL HIGH (ref 0.3–1.2)
Total Protein: 6.2 g/dL — ABNORMAL LOW (ref 6.5–8.1)

## 2022-01-01 LAB — BRAIN NATRIURETIC PEPTIDE: B Natriuretic Peptide: 466.8 pg/mL — ABNORMAL HIGH (ref 0.0–100.0)

## 2022-01-01 LAB — GLUCOSE, CAPILLARY
Glucose-Capillary: 226 mg/dL — ABNORMAL HIGH (ref 70–99)
Glucose-Capillary: 135 mg/dL — ABNORMAL HIGH (ref 70–99)
Glucose-Capillary: 231 mg/dL — ABNORMAL HIGH (ref 70–99)
Glucose-Capillary: 361 mg/dL — ABNORMAL HIGH (ref 70–99)

## 2022-01-01 LAB — MAGNESIUM: Magnesium: 1.7 mg/dL (ref 1.7–2.4)

## 2022-01-01 MED ORDER — MAGNESIUM SULFATE 2 GM/50ML IV SOLN
2.0000 g | Freq: Once | INTRAVENOUS | Status: AC
  Administered 2022-01-01: 2 g via INTRAVENOUS
  Filled 2022-01-01: qty 50

## 2022-01-01 NOTE — Plan of Care (Signed)
  Problem: Education: Goal: Knowledge of General Education information will improve Description: Including pain rating scale, medication(s)/side effects and non-pharmacologic comfort measures Outcome: Progressing   Problem: Health Behavior/Discharge Planning: Goal: Ability to manage health-related needs will improve Outcome: Progressing   Problem: Clinical Measurements: Goal: Respiratory complications will improve Outcome: Progressing   Problem: Clinical Measurements: Goal: Cardiovascular complication will be avoided Outcome: Progressing   Problem: Activity: Goal: Risk for activity intolerance will decrease Outcome: Progressing   Problem: Nutrition: Goal: Adequate nutrition will be maintained Outcome: Progressing   Problem: Coping: Goal: Level of anxiety will decrease Outcome: Progressing   Problem: Elimination: Goal: Will not experience complications related to urinary retention Outcome: Progressing   Problem: Pain Managment: Goal: General experience of comfort will improve Outcome: Progressing   Problem: Safety: Goal: Ability to remain free from injury will improve Outcome: Progressing   Problem: Skin Integrity: Goal: Risk for impaired skin integrity will decrease Outcome: Progressing   

## 2022-01-01 NOTE — TOC Progression Note (Signed)
Transition of Care [facility]) - Progression Note    Patient Details  Name: Anna Garcia MRN: [id number] Date of Birth: 1980/06/23  [facility]) CM/SW Contact  Graves-Bigelow, Ocie Cornfield, RN Phone Number: 01/01/2022, 4:28 PM  Clinical Narrative:  Case Manager received update from Physical Therapy that the patient will not need any therapy post hospitalization-mobilizing on her own. Case Manager will continue to follow for additional transition of care needs.   Expected Discharge Plan: Home/Self Care Barriers to Discharge: No Barriers Identified  Expected Discharge Plan and Services Expected Discharge Plan: Home/Self Care In-house Referral: Clinical Social Work Discharge Planning Services: CM Consult Post Acute Care Choice: [place] arrangements for the past 2 months: [place]                 DME Arranged: Bedside commode, Walker rolling DME Agency: [employer] Date DME Agency Contacted: 12/24/21 Time DME Agency Contacted: 39 Representative spoke with at DME Agency: [name] HH Arranged: PT, OT, RN, Disease Management, Nurse's Aide, Social Work [employer] Agency: [place] Date [place]: 12/24/21 Time [place]: 1405 Representative spoke with at [place]: [place]  Readmission Risk Interventions    12/14/2021   10:09 AM 09/24/2021    1:14 PM  Readmission Risk Prevention Plan  Transportation Screening Complete   PCP or Specialist Appt within 3-5 Days Complete   HRI or [place] Complete Complete  Social Work Consult for [place] Planning/Counseling Complete Complete  Palliative Care Screening Not Applicable Not Applicable  Medication Review Press photographer) Complete

## 2022-01-01 NOTE — Progress Notes (Signed)
PROGRESS NOTE                                                                                                                                                                                                             Patient Demographics:    Anna Garcia, is a 41 y.o. female, DOB - 02-09-1981, PJK:[id number]  Outpatient Primary MD for the patient is [name], [name], MD    LOS - 11  Admit date - 12/20/2021    Chief Complaint  Patient presents with   Leg Swelling       Brief Narrative (HPI from H&P)    41 y.o.  female with history of DM-1, gastroparesis, chronic pain syndrome/fibromyalgia, HTN, chronic urinary retention-self caths at home-who was just discharged from this facility on 8/29 after being treated for DKA/AKI/volume overload-presented back to the hospital with anasarca-3+ pitting edema in the lower extremity up to the thighs.  She was found to have SBP in Streptococcus bacteremia.  Further hospital course was complicated by development of encephalopathy and acute hypoxic respiratory failure likely due to aspiration pneumonia.  See below for further details.   Significant events: 8/22-8/29>> hospitalization for DKA/AKI-found to have liver cirrhosis 8/31>> admit for anasarca/significant lower extremity edema. 9/01>> IV albumin started-midodrine dosage escalated. 9/03>> hallucinating-confused. 9/05>> hypoxic after Ativan given for MRI brain-started on Ventimask.  Transfer to progressive care-subsequently titrated down to 3-4 L.   Significant studies: 2/23>>Hbs Ag, HCV YK:DXIPJASN 8/24>> bilateral lower extremity Doppler: No DVT. 8/28>>ANA/RA/Anti mitrochondrial Ab/Anti smooth muscle KN:LZJQBHAL 8/26>> Echo: EF-60-65%. 8/31>> CXR: No PNA 8/31>> CT chest/abdomen/pelvis: No PE, liver cirrhosis, prominent small bowel wall thickening, moderate volume ascites.  Generalized subcutaneous edema. 9/01>> UA: No  proteinuria. 9/02>> 24 urine copper: 21 9/3>> MRI brain: Limited images-but no acute CVA. 9/3>> EEG: No seizures. 9/3>> NH 4: 37 9/3>> vitamin B12: 1027. 9/5>> vitamin B1: Pending 9/6 >> awaiting copper stains.   Significant microbiology data: 9/01>>1/2 blood culture: Streptococcus anginosus 9/01>>ascites fluid culture: No growth 9/02>> blood culture: No growth.   Procedures: 9/01>> diagnostic paracentesis-1 L (WBC 590 w 85% neutrophils) 9/06>> liver biopsy by IR.     Subjective:   .pssub   Assessment  & Plan :   Anasarca due to decompensated liver cirrhosis: On Lasix and Aldactone gradually diuresing, still has significant volume overload, blood pressure is still limiting  factor continue to monitor on combination of diuretics as above along with midodrine and albumin for blood pressure augmentation.  She still has considerable edema will request nephrology to assist with diuresis as her creatinine is starting to creep up, also aced on fluid restriction.  Of note urine had no proteinuria.   Filed Weights   12/29/21 0500 12/30/21 1024 12/31/21 0511  Weight: 77.6 kg 77.8 kg 76.7 kg     Liver cirrhosis: Diagnosed recently on imaging studies-hepatitis serology/autoimmune work-up negative.  Recent EGD June 2023 did not show any obvious varices.  Suspicion for NASH or Wilson's disease (low ceruloplasmin levels but 24 urine copper not elevated).  Spoke with ophthalmologist-Dr. Patel-9/5-almost impossible to see KF rings without a slit-lamp exam-hence ophthalmology referral will need to be done in the outpatient setting.  Spoke with sister-in-law-[name]-no family history of liver issues that she is aware of.  Underwent liver biopsy on 9/6 - awaiting copper stains.  Thus far nonspecific cirrhotic changes.  Acute hypoxic respiratory failure likely due to aspiration pneumonia: Significantly better-on room air-last day of Rocephin today-we will be on Flagyl for a few more days.  Avoid  benzodiazepines.    Acute metabolic encephalopathy: Likely due to delirium-work-up as above-significantly better over the past few days but still not at baseline. Although has hx of some ETOH use-not felt to have etoh withdrawal as she was readmitted within 1-2 days of discharge. No further recommendations from neurology-Dr. Bhagat-recommends continuing supportive care.  Continue Seroquel/melatonin.  No focal deficits no headache.   History of chronic urinary retention-s/p self-catheterization at home: Due to worsening encephalopathy-significant urinary retention-indwelling Foley catheter placed on 9/4-however this was pulled out by the patient a couple of nights ago.  Since mental status has improved-continue frequent bladder scans and in/out catheterization accordingly.  Note patient has been doing intermittent self-catheterization at home from time to time as well.  Streptococcus bacteremia/spontaneous bacterial peritonitis: Afebrile-clinically improved-last day of Rocephin 12/28/21, discussed with ID Dr. Linus Salmons on 12/31/2021 on SBP prophylaxis duration and course, since 1 out of 2 blood cultures grew strep Keflex which should suffice for SBP prophylaxis specially with rising creatinine, if Keflex fails then Cipro can be considered.  Remains on midodrine for soft blood pressure-was on IV albumin for several days during the earlier part of the hospital stay.   Cellulitis right foot: Improved-continue IV antibiotics, now on 5 days of doxycycline starting 12/29/2021.  Wound care continue.   AKI: Likely mild hemodynamically mediated kidney injury-cautiously continue with diuretics as she remains volume overloaded.    Hypokalemia: Replete and recheck.   Macrocytic anemia: S/p 1 unit of PRBC on 9/4-no evidence of hemolysis (LDH normal)-no evidence of blood loss.  Suspect this could be related to acute illness and frequent blood draws, 1 more unit of packed RBC transfusion on 12/31/2021 continue to monitor.    History of esophageal stricture: S/p dilatation June 2023.  Denies any vomiting or dysphagia symptoms.   History of diabetic gastroparesis: No vomiting-appears stable-continue Reglan.  Encourage small portion meals.  HLD: Continue statin   Moderate persistent asthma: Not in flare-continue bronchodilators.  Chronic pain/fibromyalgia/neuropathy: Continue Neurontin.   DM-1 (A1c 7.6 on 7/30): On Semglee and sliding scale dose adjusted on 12/31/2021.  CBG (last 3)  Recent Labs    12/31/21 1549 12/31/21 2114 01/01/22 0749  GLUCAP 308* 285* 226*   Lab Results  Component Value Date   HGBA1C 7.6 (H) 11/18/2021        Condition - Extremely Guarded  Family Communication  : None present  Code Status :  Full  Consults  :  GI, IR, ID, Renal  PUD Prophylaxis : PPI   Procedures  :            Disposition Plan  :    Status is: Inpatient  DVT Prophylaxis  :    enoxaparin (LOVENOX) injection 40 mg Start: 12/21/21 1000   Lab Results  Component Value Date   PLT 98 (L) 01/01/2022    Diet :  Diet Order             Diet heart healthy/carb modified Room service appropriate? No; Fluid consistency: Thin; Fluid restriction: 1200 mL Fluid  Diet effective now                    Inpatient Medications  Scheduled Meds:  cephALEXin  500 mg Oral Q8H   enoxaparin (LOVENOX) injection  40 mg Subcutaneous Q24H   famotidine  40 mg Oral BID   feeding supplement  237 mL Oral BID BM   folic acid  2 mg Oral Daily   furosemide  40 mg Oral Daily   gabapentin  300 mg Oral BID   insulin aspart  0-5 Units Subcutaneous QHS   insulin aspart  0-9 Units Subcutaneous TID WC   insulin glargine-yfgn  16 Units Subcutaneous BID   lactulose  10 g Oral BID   lipase/protease/amylase  36,000 Units Oral TID WC   melatonin  5 mg Oral QHS   metoCLOPramide  10 mg Oral TID AC   midodrine  10 mg Oral TID WC   mometasone-formoterol  2 puff Inhalation BID   montelukast  10 mg Oral QHS    multivitamin with minerals  1 tablet Oral Daily   pantoprazole  40 mg Oral Daily   QUEtiapine  50 mg Oral QHS   rosuvastatin  20 mg Oral QHS   sodium chloride flush  3 mL Intravenous Q12H   spironolactone  50 mg Oral Daily   tamsulosin  0.4 mg Oral Daily   triamcinolone   Topical BID   Continuous Infusions:   PRN Meds:.acetaminophen, albuterol, alum & mag hydroxide-simeth, influenza vac split quadrivalent PF, loperamide, naLOXone (NARCAN)  injection, mouth rinse, traMADol  Time Spent in minutes  30   Lala Lund M.D on 01/01/2022 at 9:59 AM  To page go to [email or web address]   [facility] -  Office  [phone]  See all Orders from today for further details    Objective:   Vitals:   01/01/22 0014 01/01/22 0456 01/01/22 0746 01/01/22 0800  BP: (!) 99/53 (!) 100/59  (!) 99/51  Pulse: 91 95  93  Resp: '16 15  16  '$ Temp: 98.6 F (37 C) 97.6 F (36.4 C)  98.2 F (36.8 C)  TempSrc: Oral Oral  Oral  SpO2: 100%  94% 97%  Weight:      Height:        Wt Readings from Last 3 Encounters:  12/31/21 76.7 kg  12/12/21 89.1 kg  12/07/21 84 kg     Intake/Output Summary (Last 24 hours) at 01/01/2022 0959 Last data filed at 12/31/2021 2300 Gross per 24 hour  Intake 600 ml  Output 2600 ml  Net -2000 ml     Physical Exam  Awake but minimally confused, 3+ leg edema, right foot dorsal aspect skin under bandage, no focal deficits,  [place].AT,PERRAL Supple Neck, No JVD,   Symmetrical Chest wall movement, Good air movement bilaterally,  CTAB RRR,No Gallops, Rubs or new Murmurs,  +ve B.Sounds, Abd Soft, No tenderness,   No Cyanosis, Clubbing or edema     Data Review:    CBC Recent Labs  Lab 12/28/21 0657 12/29/21 0243 12/30/21 0412 12/31/21 0346 12/31/21 1140 01/01/22 0440  WBC 4.4 6.9 5.2 6.4  --  7.3  HGB 7.6* 8.3* 7.2* 7.0* 8.9* 9.1*  HCT 23.8* 26.0* 21.7* 21.2* 27.0* 26.8*  PLT 117* 137* 101* 104*  --  98*  MCV 104.8* 104.8* 103.3* 102.4*  --  101.1*  MCH  33.5 33.5 34.3* 33.8  --  34.3*  MCHC 31.9 31.9 33.2 33.0  --  34.0  RDW 22.4* 22.3* 22.0* 22.9*  --  23.2*  LYMPHSABS  --   --  1.9 2.0  --  2.0  MONOABS  --   --  0.5 0.6  --  0.8  EOSABS  --   --  0.1 0.1  --  0.1  BASOSABS  --   --  0.1 0.1  --  0.1    Electrolytes Recent Labs  Lab 12/26/21 0636 12/27/21 0717 12/27/21 0724 12/28/21 0657 12/29/21 0243 12/30/21 0412 12/31/21 0346 01/01/22 0440  NA 144  --    < > 141 136 137 136 139  K 3.2*  --    < > 2.9* 4.0 3.5 3.2* 3.5  CL 109  --    < > 107 105 105 105 108  CO2 23  --    < > 21* 19* 20* 21* 22  GLUCOSE 176*  --    < > 301* 245* 308* 306* 245*  BUN <5*  --    < > '7 8 6 '$ <5* <5*  CREATININE 1.22*  --    < > 1.19* 1.24* 1.16* 1.34* 1.17*  CALCIUM 9.4  --    < > 9.1 9.2 9.7 10.0 10.0  AST 40  --   --   --  70* 51* 46* 50*  ALT 16  --   --   --  '22 19 17 17  '$ ALKPHOS 89  --   --   --  137* 133* 137* 129*  BILITOT 2.1*  --   --   --  1.1 1.3* 1.5* 2.1*  ALBUMIN 4.0  --   --   --  4.0 4.6 4.7 4.3  MG  --   --    < > 2.1 2.0 2.1 1.9 1.7  PROCALCITON 0.46 0.97  --   --   --   --   --   --   INR 1.5*  --   --   --   --   --   --   --   AMMONIA 42*  --   --   --   --   --   --   --   BNP  --   --   --   --   --  650.8* 802.2* 466.8*   < > = values in this interval not displayed.   Radiology Reports No results found.

## 2022-01-01 NOTE — Plan of Care (Signed)
  Problem: Education: Goal: Knowledge of General Education information will improve Description: Including pain rating scale, medication(s)/side effects and non-pharmacologic comfort measures Outcome: Progressing   Problem: Health Behavior/Discharge Planning: Goal: Ability to manage health-related needs will improve Outcome: Progressing   Problem: Clinical Measurements: Goal: Respiratory complications will improve Outcome: Progressing   Problem: Clinical Measurements: Goal: Cardiovascular complication will be avoided Outcome: Progressing   Problem: Coping: Goal: Level of anxiety will decrease Outcome: Progressing   Problem: Elimination: Goal: Will not experience complications related to urinary retention Outcome: Progressing   Problem: Pain Managment: Goal: General experience of comfort will improve Outcome: Progressing   Problem: Safety: Goal: Ability to remain free from injury will improve Outcome: Progressing   Problem: Skin Integrity: Goal: Risk for impaired skin integrity will decrease Outcome: Progressing

## 2022-01-01 NOTE — Progress Notes (Signed)
[facility] Progress Note   Subjective:   sleepy this AM.  No new complaints.  No retention noted, hasn't required I/O cath.  Edema improving. No dyspnea.  No F/C.  Objective Vitals:   01/01/22 0014 01/01/22 0456 01/01/22 0746 01/01/22 0800  BP: (!) 99/53 (!) 100/59  (!) 99/51  Pulse: 91 95  93  Resp: '16 15  16  '$ Temp: 98.6 F (37 C) 97.6 F (36.4 C)  98.2 F (36.8 C)  TempSrc: Oral Oral  Oral  SpO2: 100%  94% 97%  Weight:      Height:       Physical Exam Gen: chronically ill appearing woman sitting up in bed comfortably Eyes: anicteric ENT: MMM CV:  RRR Abd:  soft, mildly distended Lungs: clear GU: no foley Extr: 2+ pitting edema to thighs Neuro: AOx3 Skin: tibial skin is erythematous but not warm or fluctuant  Additional Objective Labs: Basic Metabolic Panel: Recent Labs  Lab 12/30/21 0412 12/31/21 0346 01/01/22 0440  NA 137 136 139  K 3.5 3.2* 3.5  CL 105 105 108  CO2 20* 21* 22  GLUCOSE 308* 306* 245*  BUN 6 <5* <5*  CREATININE 1.16* 1.34* 1.17*  CALCIUM 9.7 10.0 10.0   Liver Function Tests: Recent Labs  Lab 12/30/21 0412 12/31/21 0346 01/01/22 0440  AST 51* 46* 50*  ALT '19 17 17  '$ ALKPHOS 133* 137* 129*  BILITOT 1.3* 1.5* 2.1*  PROT 6.6 6.6 6.2*  ALBUMIN 4.6 4.7 4.3   No results for input(s): "LIPASE", "AMYLASE" in the last 168 hours. CBC: Recent Labs  Lab 12/28/21 0657 12/29/21 0243 12/30/21 0412 12/31/21 0346 12/31/21 1140 01/01/22 0440  WBC 4.4 6.9 5.2 6.4  --  7.3  NEUTROABS  --   --  2.6 3.6  --  4.2  HGB 7.6* 8.3* 7.2* 7.0* 8.9* 9.1*  HCT 23.8* 26.0* 21.7* 21.2* 27.0* 26.8*  MCV 104.8* 104.8* 103.3* 102.4*  --  101.1*  PLT 117* 137* 101* 104*  --  98*   Blood Culture    Component Value Date/Time   SDES URINE, CATHETERIZED 12/25/2021 0851   SPECREQUEST NONE 12/25/2021 0851   CULT  12/25/2021 0851    NO GROWTH Performed at [facility], [place] [address] [place], [place] [address]    REPTSTATUS  12/26/2021 FINAL 12/25/2021 0851    Cardiac Enzymes: No results for input(s): "CKTOTAL", "CKMB", "CKMBINDEX", "TROPONINI" in the last 168 hours. CBG: Recent Labs  Lab 12/31/21 0752 12/31/21 1309 12/31/21 1549 12/31/21 2114 01/01/22 0749  GLUCAP 301* 311* 308* 285* 226*   Iron Studies: No results for input(s): "IRON", "TIBC", "TRANSFERRIN", "FERRITIN" in the last 72 hours. '@[email or web address]'$ @ Studies/Results: No results found. Medications:   cephALEXin  500 mg Oral Q8H   enoxaparin (LOVENOX) injection  40 mg Subcutaneous Q24H   famotidine  40 mg Oral BID   feeding supplement  237 mL Oral BID BM   folic acid  2 mg Oral Daily   furosemide  40 mg Oral Daily   gabapentin  300 mg Oral BID   insulin aspart  0-5 Units Subcutaneous QHS   insulin aspart  0-9 Units Subcutaneous TID WC   insulin glargine-yfgn  16 Units Subcutaneous BID   lactulose  10 g Oral BID   lipase/protease/amylase  36,000 Units Oral TID WC   melatonin  5 mg Oral QHS   metoCLOPramide  10 mg Oral TID AC   midodrine  10 mg Oral TID WC  mometasone-formoterol  2 puff Inhalation BID   montelukast  10 mg Oral QHS   multivitamin with minerals  1 tablet Oral Daily   pantoprazole  40 mg Oral Daily   QUEtiapine  50 mg Oral QHS   rosuvastatin  20 mg Oral QHS   sodium chloride flush  3 mL Intravenous Q12H   spironolactone  50 mg Oral Daily   tamsulosin  0.4 mg Oral Daily   triamcinolone   Topical BID      Assessment/Plan: Anna Garcia is an 41 y.o. female with type 1 DM, gastroparesis, GERD< fibromyalgia, macrocytic anemia, chronic pain, recent dx cirrhosis (unclear etiology) who is seen for evaluation and management of anasarca and AKI.    **Anasarca:  in the setting of newly diagnosed cirrhosis.  No e/o nephrotic syndrome with UA neg for protein.  Has diuresed a good bit but still volume overloaded.  Albumin was supplemented but serum albumin now > 4 so will hold doses today.  Continue na/fluid restriction.  Changing  to lasix 40 daily today + already on spironolactone 50 - can titrate loop dose as needed, expect at some point when she reaches euvolemia dose will need to be lower.  Will need to be slow and steady to prevent AKI.  Says compression stockings too painful to wear with neuropathy.   **AKI on CKD 3:  baseline Cr in the 1-1.2 range, Cr up to 1.3 in setting of diuresis but improved to 1.17 today.  Suspect she has a component of underlying DKD now with mild AKI in the setting of diuresis.  UA fairly normal.  Do not suspect HRS at this time.  Does I/O caths PRN - getting bladder scan to ensure no retention.  2022 renal US was ok, hold of on repeating for now. Aim for slow and steady diuresis, watching daily labs.  Avoid hypotension (on midodrine now) and nephrotoxins.    **Cirrhosis: recent dx, etiology unclear.  GI following - liver biopsy pending copper stains.    **Type 1 DM: per primary.   **Macrocytic anemia:  Hb in 7-8s, has been transfused.     **SBP: streptococcus.  ID has been consulted.  On cephalexin.     **Cellulitis:  on doxycycline for foot cellulitis - watch legs, some erythema noted but less than yesterday.    Nothing further to add inpatient.  I'll follow up with her 4-6 wks after d/c at [place], my office will contact her with info.   Jannifer Hick MD 01/01/2022, 10:18 AM  [facility] Pager: [phone]

## 2022-01-01 NOTE — Progress Notes (Signed)
Mobility Specialist - Progress Note   01/01/22 1104  Mobility  Activity Refused mobility   Pt was received in bed sleeping. Pt was hard to arouse and not suitable to mobility at this time. Will follow up later today.   Larey Seat

## 2022-01-01 NOTE — Progress Notes (Signed)
Mobility Specialist - Progress Note   01/01/22 1605  Mobility  Activity Ambulated with assistance in hallway  Level of Assistance Standby assist, set-up cues, supervision of patient - no hands on  Assistive Device None  Distance Ambulated (ft) 470 ft  Activity Response Tolerated fair  $Mobility charge 1 Mobility   Pt was received in bed and agreeable to mobility. Pt c/o leg pain and described walking as "foreign". Pt was returned to bed with all need met.   Larey Seat

## 2022-01-01 NOTE — Progress Notes (Addendum)
Attending physician's note   I have taken a history, reviewed the chart, and examined the patient. I performed a substantive portion of this encounter, including complete performance of at least one of the key components, in conjunction with the APP. I agree with the APP's note, impression, and recommendations with my edits.   Anna Garcia looks and interacts much better today.  Tolerating p.o.  No abdominal pain, nausea/vomiting.  Will be several days before we get additional copper staining results back.  Similarly, PEth panel still pending.  T. bili slight uptrend at 2.1, but potentially related to bacteremia, ABX, etc.  Otherwise ALP stable and no abdominal pain.  Inpatient GI team will moved to standby.  Please do not hesitate to contact us with additional questions or concerns.  [place], DO, FACG [phone] office                Daily Rounding Note  01/01/2022, 1:12 PM  LOS: 11 days   SUBJECTIVE:   Chief complaint: Decompensated cirrhosis.     Significantly more alert today than I have seen her at any other point during her stay.  She is asking appropriate questions.  Wondering what is causing the redness and swelling in her legs, I explained that she has a diagnosis of cellulitis.  Also concerned because she sees IV fluids running associated with magnesium administration and is concerned because she has had such problems with edema in her legs.  Still has pain/discomfort in her legs.  Denies abdominal pain.  Asked about the results of the copper stain on her liver.   OBJECTIVE:         Vital signs in last 24 hours:    Temp:  [97.6 F (36.4 C)-98.8 F (37.1 C)] 98.2 F (36.8 C) (09/12 0800) Pulse Rate:  [86-95] 93 (09/12 0800) Resp:  [15-20] 16 (09/12 0800) BP: (95-100)/(51-60) 99/51 (09/12 0800) SpO2:  [94 %-100 %] 97 % (09/12 0800) Last BM Date : 12/31/21 Filed Weights   12/29/21 0500 12/30/21 1024 12/31/21  0511  Weight: 77.6 kg 77.8 kg 76.7 kg   General: Pale, chronically ill-appearing but alert, comfortable. Heart: RRR. Chest: No labored breathing or cough.  Lungs clear bilaterally Abdomen: Soft.  Slight tenderness in the epigastrium and across the upper abdomen without guarding or rebound.  Active bowel sounds. Extremities: No CCE. Neuro/Psych: Totally appropriate.  Fluid speech.  Large vocabulary.  No tremors or asterixis, no gross deficits.  Overall appears entirely appropriate  Intake/Output from previous day: 09/11 0701 - 09/12 0700 In: 1305.9 [P.O.:840; I.V.:32.5; Blood:381.7; IV Piggyback:51.7] Out: 2600 [Urine:2600]  Intake/Output this shift: Total I/O In: -  Out: 750 [Urine:750]  Lab Results: Recent Labs    12/30/21 0412 12/31/21 0346 12/31/21 1140 01/01/22 0440  WBC 5.2 6.4  --  7.3  HGB 7.2* 7.0* 8.9* 9.1*  HCT 21.7* 21.2* 27.0* 26.8*  PLT 101* 104*  --  98*   BMET Recent Labs    12/30/21 0412 12/31/21 0346 01/01/22 0440  NA 137 136 139  K 3.5 3.2* 3.5  CL 105 105 108  CO2 20* 21* 22  GLUCOSE 308* 306* 245*  BUN 6 <5* <5*  CREATININE 1.16* 1.34* 1.17*  CALCIUM 9.7 10.0 10.0   LFT Recent Labs    12/30/21 0412 12/31/21 0346 01/01/22 0440  PROT 6.6 6.6 6.2*  ALBUMIN 4.6 4.7 4.3  AST 51* 46* 50*  ALT '19 17 17  '$ ALKPHOS 133* 137* 129*  BILITOT 1.3* 1.5*  2.1*   PT/INR No results for input(s): "LABPROT", "INR" in the last 72 hours. Hepatitis Panel No results for input(s): "HEPBSAG", "HCVAB", "HEPAIGM", "HEPBIGM" in the last 72 hours.  Studies/Results: No results found.  Scheduled Meds:  cephALEXin  500 mg Oral Q8H   enoxaparin (LOVENOX) injection  40 mg Subcutaneous Q24H   famotidine  40 mg Oral BID   feeding supplement  237 mL Oral BID BM   folic acid  2 mg Oral Daily   furosemide  40 mg Oral Daily   gabapentin  300 mg Oral BID   insulin aspart  0-5 Units Subcutaneous QHS   insulin aspart  0-9 Units Subcutaneous TID WC   insulin  glargine-yfgn  16 Units Subcutaneous BID   lactulose  10 g Oral BID   lipase/protease/amylase  36,000 Units Oral TID WC   melatonin  5 mg Oral QHS   metoCLOPramide  10 mg Oral TID AC   midodrine  10 mg Oral TID WC   mometasone-formoterol  2 puff Inhalation BID   montelukast  10 mg Oral QHS   multivitamin with minerals  1 tablet Oral Daily   pantoprazole  40 mg Oral Daily   QUEtiapine  50 mg Oral QHS   rosuvastatin  20 mg Oral QHS   sodium chloride flush  3 mL Intravenous Q12H   spironolactone  50 mg Oral Daily   tamsulosin  0.4 mg Oral Daily   triamcinolone   Topical BID   Continuous Infusions: PRN Meds:.acetaminophen, albuterol, alum & mag hydroxide-simeth, influenza vac split quadrivalent PF, loperamide, naLOXone (NARCAN)  injection, mouth rinse, traMADol   ASSESMENT:     Decompensated cirrhosis.  Likely etiology alcohol and metabolic steatohepatitis.  No definite diagnosis Wilson's disease.  Urine copper not elevated.  Serum copper below normal.  Liver pathology shows cirrhosis in a background of steatohepatitis.  Mixed macro and microvesicular pattern, bridging fibrosis, extensive bile duct proliferation consistent with cirrhosis.  Mild pericellular/perisinusoidal fibrosis is not prominent.  Stains reveal some ballooning degeneration, rare apoptotic body, rare Mallory Denk body.  Findings can be seen in toxic/metabolic etiologies including alcohol.  Still waiting on copper stain.     Encephalopathy.  Multifactorial including alcohol withdrawal, acute respiratory failure in setting of aspiration pneumonia, hepatic encephalopathy, nutritional deficiencies/Warnicke's, cannot exclude Wilson's disease.highest ammonia 37 > 1 week ago.  On Lactulose, folic acid.   At encounter today she is so much better.  Anasarca.     Aspiration PNA w hypoxic resp failure.  Treated through 9/8 w Metronidazole.  Now on doxycycline through 9/14.   R foot cellulitis.  Keflex now in place per ID rec.      Streptococcus anginosus positive blood clx.  Multiple sources possible.  Keflex in place per ID.      SBP, ascites.  12/21/21 1 L paracentesis: + for SBP, treatement w Rocephin.  Fluid clx negative.  Current abx is Keflex.     Acute on chronic Anemia.  2 PRBCs thus far. Studies for hereditary hemochromatosis negative (HFE genotype [id number], HFE Genotype [id number])     Likely Gastroparesis in pt w IDDM.  09/2021 EGD: [facility].  Benign-appearing intrinsic stenosis, traversable.  Savary dilated to 16 mm.  Minimal gastritis.  Normal duodenum to third portion.  Dexilant, famotidine at home.  Reglan now in place.        Dysphagia.  Esoph mano and esoph Ph studies at [facility] 10/2015 (not able to see report but later notres state "pathologic GERD".  09/2021 EGD: [facility].  Benign-appearing intrinsic stenosis, traversable.  Savary dilated to 16 mm.  Minimal gastritis.  Normal duodenum to third portion.    AKI on CKD 3.  Renal has low suspicion for HRS.  Chronic urinary retention requiring self cath at home.      IDDM w admissions for DKA.  Diagnosed with diabetes at age 32.  Latest A1c was on 7/30, 7.6.     Altered bowel habits.  Motegrity daily at home. Also Imodium prn at home.  Now on Lactulose.  Creon AC PTA.      PLAN   Copper stain on the liver is a send out to the [facility].  This slides were only sent out yesterday so it is going to be many days before we have any results on this.    Anna Garcia  01/01/2022, 1:12 PM Phone [phone]

## 2022-01-02 DIAGNOSIS — K7460 Unspecified cirrhosis of liver: Secondary | ICD-10-CM | POA: Diagnosis not present

## 2022-01-02 DIAGNOSIS — R188 Other ascites: Secondary | ICD-10-CM | POA: Diagnosis not present

## 2022-01-02 LAB — CBC WITH DIFFERENTIAL/PLATELET
Abs Immature Granulocytes: 0.02 10*3/uL (ref 0.00–0.07)
Basophils Absolute: 0.1 10*3/uL (ref 0.0–0.1)
Basophils Relative: 1 %
Eosinophils Absolute: 0.1 10*3/uL (ref 0.0–0.5)
Eosinophils Relative: 1 %
HCT: 27 % — ABNORMAL LOW (ref 36.0–46.0)
Hemoglobin: 9.1 g/dL — ABNORMAL LOW (ref 12.0–15.0)
Immature Granulocytes: 0 %
Lymphocytes Relative: 25 %
Lymphs Abs: 1.9 10*3/uL (ref 0.7–4.0)
MCH: 34.3 pg — ABNORMAL HIGH (ref 26.0–34.0)
MCHC: 33.7 g/dL (ref 30.0–36.0)
MCV: 101.9 fL — ABNORMAL HIGH (ref 80.0–100.0)
Monocytes Absolute: 0.9 10*3/uL (ref 0.1–1.0)
Monocytes Relative: 11 %
Neutro Abs: 4.7 10*3/uL (ref 1.7–7.7)
Neutrophils Relative %: 62 %
Platelets: 108 10*3/uL — ABNORMAL LOW (ref 150–400)
RBC: 2.65 MIL/uL — ABNORMAL LOW (ref 3.87–5.11)
RDW: 23.3 % — ABNORMAL HIGH (ref 11.5–15.5)
Smear Review: NORMAL
WBC: 7.7 10*3/uL (ref 4.0–10.5)
nRBC: 0 % (ref 0.0–0.2)

## 2022-01-02 LAB — COMPREHENSIVE METABOLIC PANEL
ALT: 17 U/L (ref 0–44)
AST: 51 U/L — ABNORMAL HIGH (ref 15–41)
Albumin: 3.9 g/dL (ref 3.5–5.0)
Alkaline Phosphatase: 141 U/L — ABNORMAL HIGH (ref 38–126)
Anion gap: 9 (ref 5–15)
BUN: 8 mg/dL (ref 6–20)
CO2: 21 mmol/L — ABNORMAL LOW (ref 22–32)
Calcium: 9.5 mg/dL (ref 8.9–10.3)
Chloride: 104 mmol/L (ref 98–111)
Creatinine, Ser: 1.27 mg/dL — ABNORMAL HIGH (ref 0.44–1.00)
GFR, Estimated: 55 mL/min — ABNORMAL LOW (ref >60)
Glucose, Bld: 381 mg/dL — ABNORMAL HIGH (ref 70–99)
Potassium: 4 mmol/L (ref 3.5–5.1)
Sodium: 134 mmol/L — ABNORMAL LOW (ref 135–145)
Total Bilirubin: 2.2 mg/dL — ABNORMAL HIGH (ref 0.3–1.2)
Total Protein: 6.1 g/dL — ABNORMAL LOW (ref 6.5–8.1)

## 2022-01-02 LAB — MAGNESIUM: Magnesium: 2.2 mg/dL (ref 1.7–2.4)

## 2022-01-02 LAB — GLUCOSE, CAPILLARY
Glucose-Capillary: 345 mg/dL — ABNORMAL HIGH (ref 70–99)
Glucose-Capillary: 300 mg/dL — ABNORMAL HIGH (ref 70–99)
Glucose-Capillary: 324 mg/dL — ABNORMAL HIGH (ref 70–99)
Glucose-Capillary: 356 mg/dL — ABNORMAL HIGH (ref 70–99)

## 2022-01-02 LAB — BRAIN NATRIURETIC PEPTIDE: B Natriuretic Peptide: 405.7 pg/mL — ABNORMAL HIGH (ref 0.0–100.0)

## 2022-01-02 LAB — AMMONIA: Ammonia: 41 umol/L — ABNORMAL HIGH (ref 9–35)

## 2022-01-02 NOTE — Progress Notes (Signed)
Occupational Therapy Treatment Patient Details Name: Anna Garcia MRN: [id number] DOB: 06/05/1980 Today's Date: 01/02/2022   History of present illness Pt is a 41 y/o female admitted secondary to LE swelling, RLE cellulitis, and liver cirrhosis. Liver biopsy 9/6. PMH includes DM, alcohol abuse, fibromyalgia, chronic pain syndrome, HTN, chronic urinary retention with self caths.   OT comments  Patient continues to make steady progress towards goals in skilled OT session. Patient's session encompassed practice with tub transfers in anticipation of discharge this week. Patient is mod A to transfer over tub, patient with significant difficutly elevating feet to transition over tub surface in standing, therefore preliminary practice completed simulating with tub transfer bench. Patient with difficulty understanding simulation, therefore practice to be completed with actual tub bench in order to promote increased safety and understanding. Order completed for tub transfer bench for patient with case manager notified. OT will continue to follow.    Recommendations for follow up therapy are one component of a multi-disciplinary discharge planning process, led by the attending physician.  Recommendations may be updated based on patient status, additional functional criteria and insurance authorization.    Follow Up Recommendations  No OT follow up    Assistance Recommended at Discharge Set up Supervision/Assistance  Patient can return home with the following  A little help with walking and/or transfers;A little help with bathing/dressing/bathroom;Assistance with cooking/housework;Assist for transportation   Equipment Recommendations  Tub/shower seat    Recommendations for Other Services      Precautions / Restrictions Restrictions Weight Bearing Restrictions: No       Mobility Bed Mobility Overal bed mobility: Needs Assistance Bed Mobility: Sit to Supine, Supine to Sit     Supine to sit:  Independent Sit to supine: Independent        Transfers Overall transfer level: Independent Equipment used: None Transfers: Sit to/from Stand Sit to Stand: Independent                 Balance Overall balance assessment: Modified Independent                                         ADL either performed or assessed with clinical judgement   ADL Overall ADL's : Needs assistance/impaired                                 Tub/ Shower Transfer: Moderate assistance Tub/Shower Transfer Details (indicate cue type and reason): Mod A to transfer over tub, patient with significant difficutly elevating feet to transition over tub surface in standing, preliminary practice completed simulating with tub transfer bench   General ADL Comments: Session focus on tub transfers for discharge home in the next few days    Extremity/Trunk Assessment              Vision       Perception     Praxis      Cognition Arousal/Alertness: Awake/alert Behavior During Therapy: WFL for tasks assessed/performed Overall Cognitive Status: Within Functional Limits for tasks assessed                                 General Comments: Continues to demonstrate increased ability to aniticipatory plan, able to strategize with OT with regard to tub transfers, the set  up of her shower, and potential adaptive equipment        Exercises      Shoulder Instructions       General Comments      Pertinent Vitals/ Pain       Pain Assessment Pain Assessment: Faces Faces Pain Scale: Hurts little more Pain Location: Bilateral feet Pain Descriptors / Indicators: Discomfort, Guarding, Grimacing Pain Intervention(s): Limited activity within patient's tolerance, Monitored during session, Premedicated before session, Repositioned  Home Living                                          Prior Functioning/Environment              Frequency   Min 2X/week        Progress Toward Goals  OT Goals(current goals can now be found in the care plan section)  Progress towards OT goals: Progressing toward goals  Acute Rehab OT Goals Patient Stated Goal: to get all this sorted out OT Goal Formulation: With patient Time For Goal Achievement: 01/04/22 Potential to Achieve Goals: Good  Plan Discharge plan remains appropriate    Co-evaluation                 AM-PAC OT "6 Clicks" Daily Activity     Outcome Measure   Help from another person eating meals?: None Help from another person taking care of personal grooming?: None Help from another person toileting, which includes using toliet, bedpan, or urinal?: None Help from another person bathing (including washing, rinsing, drying)?: A Little Help from another person to put on and taking off regular upper body clothing?: None Help from another person to put on and taking off regular lower body clothing?: A Little 6 Click Score: 22    End of Session    OT Visit Diagnosis: Unsteadiness on feet (R26.81);History of falling (Z91.81);Pain Pain - Right/Left: Right Pain - part of body: Leg   Activity Tolerance Patient tolerated treatment well   Patient Left in bed;with call bell/phone within reach   Nurse Communication Mobility status        Time: 1000-1024 OT Time Calculation (min): 24 min  Charges: OT General Charges $OT Visit: 1 Visit OT Treatments $Self Care/Home Management : 23-37 mins  [place]. Jeanette Rauth, OTR/L Acute Rehabilitation Services [phone]   Ascencion Dike 01/02/2022, 12:06 PM

## 2022-01-02 NOTE — Inpatient Diabetes Management (Signed)
Inpatient Diabetes Program Recommendations  AACE/ADA: New Consensus Statement on Inpatient Glycemic Control (2015)  Target Ranges:  Prepandial:   less than 140 mg/dL      Peak postprandial:   less than 180 mg/dL (1-2 hours)      Critically ill patients:  140 - 180 mg/dL   Lab Results  Component Value Date   GLUCAP 300 (H) 01/02/2022   HGBA1C 7.6 (H) 11/18/2021    Review of Glycemic Control  Diabetes history: DM1 Outpatient Diabetes medications: Lantus 8 units BID, Apidra 0-8 units SSI and carbohydrates Current orders for Inpatient glycemic control: Semglee 16 units BID, Novolog 0-9 units TID with meals and 0-5 HS  Post-prandials elevated.   Inpatient Diabetes Program Recommendations:    Recommend adding Novolog 3 units TID with meals if patient is eating at least 50% of meals and if blood sugars continue to be elevated.  Continue to follow.  Thank you. Lorenda Peck, RD, LDN, [place] Inpatient Diabetes Coordinator [phone]

## 2022-01-02 NOTE — TOC Progression Note (Signed)
Transition of Care [facility]) - Progression Note    Patient Details  Name: Anna Garcia MRN: [id number] Date of Birth: 05-Jun-1980  Transition of [facility]) CM/SW Contact  Graves-Bigelow, Ocie Cornfield, RN Phone Number: 01/02/2022, 1:52 PM  Clinical Narrative:  Case Manager spoke with the patient regarding transition of care needs. [name] will be able to assist with home health services: Registered Nurse for medication management, Aide for bath, and Education officer, museum for [employer] and assistance with [employer] navigation. Patient is agreeable to services. Start of care to begin within 24-48 hours post transition home. Case Manager called [employer] for DME tub bench and rolling walker to be delivered to the room prior to transition home. No further needs identified at this time.    Expected Discharge Plan: [place] Barriers to Discharge: No Barriers Identified  Expected Discharge Plan and Services Expected Discharge Plan: [place] In-house Referral: Clinical Social Work Discharge Planning Services: CM Consult Post Acute Care Choice: [place] arrangements for the past 2 months: [place], Apartment                 DME Arranged: Walker rolling, Tub bench DME Agency: [employer] Date DME Agency Contacted: 01/02/22 Time DME Agency Contacted: [id number] Representative spoke with at DME Agency: [name] HH Arranged: RN, Disease Management, Social Work, Nurse's Aide [place]: [place] Date [place]: 01/02/22 Time [place]: 1350 Representative spoke with at [place]: [place]  Readmission Risk Interventions    12/14/2021   10:09 AM 09/24/2021    1:14 PM  Readmission Risk Prevention Plan  Transportation Screening Complete   PCP or Specialist Appt within 3-5 Days Complete   HRI or [place] Complete Complete  Social Work Consult for [place] Planning/Counseling Complete Complete  Palliative  Care Screening Not Applicable Not Applicable  Medication Review Press photographer) Complete

## 2022-01-02 NOTE — Progress Notes (Signed)
PROGRESS NOTE                                                                                                                                                                                                             Patient Demographics:    Anna Garcia, is a 41 y.o. female, DOB - 1980/12/31, WLN:[id number]  Outpatient Primary MD for the patient is Day, [name], MD    LOS - 12  Admit date - 12/20/2021    Chief Complaint  Patient presents with   Leg Swelling       Brief Narrative (HPI from H&P)    41 y.o.  female with history of DM-1, gastroparesis, chronic pain syndrome/fibromyalgia, HTN, chronic urinary retention-self caths at home-who was just discharged from this facility on 8/29 after being treated for DKA/AKI/volume overload-presented back to the hospital with anasarca-3+ pitting edema in the lower extremity up to the thighs.  She was found to have SBP in Streptococcus bacteremia.  Further hospital course was complicated by development of encephalopathy and acute hypoxic respiratory failure likely due to aspiration pneumonia.  See below for further details.   Significant events: 8/22-8/29>> hospitalization for DKA/AKI-found to have liver cirrhosis 8/31>> admit for anasarca/significant lower extremity edema. 9/01>> IV albumin started-midodrine dosage escalated. 9/03>> hallucinating-confused. 9/05>> hypoxic after Ativan given for MRI brain-started on Ventimask.  Transfer to progressive care-subsequently titrated down to 3-4 L.   Significant studies: 2/23>>Hbs Ag, HCV DE:[id number] 8/24>> bilateral lower extremity Doppler: No DVT. 8/28>>ANA/RA/Anti mitrochondrial Ab/Anti smooth muscle HU:[id number] 8/26>> Echo: EF-60-65%. 8/31>> CXR: No PNA 8/31>> CT chest/abdomen/pelvis: No PE, liver cirrhosis, prominent small bowel wall thickening, moderate volume ascites.  Generalized subcutaneous edema. 9/01>> UA: No  proteinuria. 9/02>> 24 urine copper: 21 9/3>> MRI brain: Limited images-but no acute CVA. 9/3>> EEG: No seizures. 9/3>> NH 4: 37 9/3>> vitamin B12: 1027. 9/5>> vitamin B1: Pending 9/6 >> awaiting copper stains.   Significant microbiology data: 9/01>>1/2 blood culture: Streptococcus anginosus 9/01>>ascites fluid culture: No growth 9/02>> blood culture: No growth.   Procedures: 9/01>> diagnostic paracentesis-1 L (WBC 590 w 85% neutrophils) 9/06>> liver biopsy by IR.     Subjective:   Patient in bed, appears comfortable, denies any headache, no fever, no chest pain or pressure, no shortness of breath , no abdominal pain. No new focal weakness.    Assessment  &  Plan :   Note patient is getting stable for discharge from medical standpoint however she still has intermittent episodes of confusion and delirium, there is no family or friends nearby to supervise her.  I have informed TOC team to look for safe disposition.  They were informed on 01/02/2022.      Anasarca due to decompensated liver cirrhosis: On Lasix and Aldactone gradually diuresing, still has significant volume overload, blood pressure is still limiting factor continue to monitor on combination of diuretics as above along with midodrine and albumin for blood pressure augmentation.  She still has considerable edema will request nephrology to assist with diuresis as her creatinine is starting to creep up, also aced on fluid restriction.  Of note urine had no proteinuria.   Filed Weights   12/30/21 1024 12/31/21 0511 01/02/22 0212  Weight: 77.8 kg 76.7 kg 76.1 kg     Liver cirrhosis: Diagnosed recently on imaging studies-hepatitis serology/autoimmune work-up negative.  Recent EGD June 2023 did not show any obvious varices.  Suspicion for NASH or Wilson's disease (low ceruloplasmin levels but 24 urine copper not elevated).  Spoke with ophthalmologist-Dr. Patel-9/5-almost impossible to see KF rings without a slit-lamp  exam-hence ophthalmology referral will need to be done in the outpatient setting.  Spoke with sister-in-law-[name]-no family history of liver issues that she is aware of.  Underwent liver biopsy on 9/6 - awaiting copper stains.  Thus far nonspecific cirrhotic changes.  Acute hypoxic respiratory failure likely due to aspiration pneumonia: Significantly better-on room air-last day of Rocephin today-we will be on Flagyl for a few more days.  Avoid benzodiazepines.    Acute metabolic encephalopathy: Likely due to delirium-work-up as above-significantly better over the past few days but still not at baseline. Although has hx of some ETOH use-not felt to have etoh withdrawal as she was readmitted within 1-2 days of discharge. No further recommendations from neurology-Dr. Bhagat-recommends continuing supportive care.  Continue Seroquel/melatonin.  No focal deficits no headache.   History of chronic urinary retention-s/p self-catheterization at home: Due to worsening encephalopathy-significant urinary retention-indwelling Foley catheter placed on 9/4-however this was pulled out by the patient a couple of nights ago.  Since mental status has improved-continue frequent bladder scans and in/out catheterization accordingly.  Note patient has been doing intermittent self-catheterization at home from time to time as well.  Streptococcus bacteremia/spontaneous bacterial peritonitis: Afebrile-clinically improved-last day of Rocephin 12/28/21, discussed with ID Dr. Linus Salmons on 12/31/2021 on SBP prophylaxis duration and course, since 1 out of 2 blood cultures grew strep Keflex which should suffice for SBP prophylaxis specially with rising creatinine, if Keflex fails then Cipro can be considered.  Remains on midodrine for soft blood pressure-was on IV albumin for several days during the earlier part of the hospital stay.   Cellulitis right foot: Improved-continue IV antibiotics, now on 5 days of doxycycline starting 12/29/2021.   Wound care continue.   AKI: Likely mild hemodynamically mediated kidney injury-cautiously continue with diuretics as she remains volume overloaded.    Hypokalemia: Replete and recheck.   Macrocytic anemia: S/p 1 unit of PRBC on 9/4-no evidence of hemolysis (LDH normal)-no evidence of blood loss.  Suspect this could be related to acute illness and frequent blood draws, 1 more unit of packed RBC transfusion on 12/31/2021 continue to monitor.   History of esophageal stricture: S/p dilatation June 2023.  Denies any vomiting or dysphagia symptoms.   History of diabetic gastroparesis: No vomiting-appears stable-continue Reglan.  Encourage small portion meals.  HLD: Continue  statin   Moderate persistent asthma: Not in flare-continue bronchodilators.  Chronic pain/fibromyalgia/neuropathy: Continue Neurontin.   DM-1 (A1c 7.6 on 7/30): On Semglee and sliding scale dose adjusted on 12/31/2021.  CBG (last 3)  Recent Labs    01/01/22 1710 01/01/22 2111 01/02/22 0802  GLUCAP 231* 361* 345*   Lab Results  Component Value Date   HGBA1C 7.6 (H) 11/18/2021        Condition - Extremely Guarded  Family Communication  : Discussed with sister-in-law [name] [phone] closest family member on 01/02/2022.  Code Status :  Full  Consults  :  GI, IR, ID, Renal  PUD Prophylaxis : PPI   Procedures  :            Disposition Plan  :    Status is: Inpatient  DVT Prophylaxis  :    enoxaparin (LOVENOX) injection 40 mg Start: 12/21/21 1000   Lab Results  Component Value Date   PLT 108 (L) 01/02/2022    Diet :  Diet Order             Diet heart healthy/carb modified Room service appropriate? No; Fluid consistency: Thin; Fluid restriction: 1200 mL Fluid  Diet effective now                    Inpatient Medications  Scheduled Meds:  cephALEXin  500 mg Oral Q8H   enoxaparin (LOVENOX) injection  40 mg Subcutaneous Q24H   famotidine  40 mg Oral BID   feeding supplement  237 mL  Oral BID BM   folic acid  2 mg Oral Daily   furosemide  40 mg Oral Daily   gabapentin  300 mg Oral BID   insulin aspart  0-5 Units Subcutaneous QHS   insulin aspart  0-9 Units Subcutaneous TID WC   insulin glargine-yfgn  16 Units Subcutaneous BID   lactulose  10 g Oral BID   lipase/protease/amylase  36,000 Units Oral TID WC   melatonin  5 mg Oral QHS   metoCLOPramide  10 mg Oral TID AC   midodrine  10 mg Oral TID WC   mometasone-formoterol  2 puff Inhalation BID   montelukast  10 mg Oral QHS   multivitamin with minerals  1 tablet Oral Daily   pantoprazole  40 mg Oral Daily   QUEtiapine  50 mg Oral QHS   rosuvastatin  20 mg Oral QHS   sodium chloride flush  3 mL Intravenous Q12H   spironolactone  50 mg Oral Daily   tamsulosin  0.4 mg Oral Daily   triamcinolone   Topical BID   Continuous Infusions:   PRN Meds:.acetaminophen, albuterol, alum & mag hydroxide-simeth, influenza vac split quadrivalent PF, loperamide, naLOXone (NARCAN)  injection, mouth rinse, traMADol  Time Spent in minutes  30   Lala Lund M.D on 01/02/2022 at 8:56 AM  To page go to [email or web address]   [facility] -  Office  [phone]  See all Orders from today for further details    Objective:   Vitals:   01/01/22 2113 01/02/22 0045 01/02/22 0212 01/02/22 0807  BP:  109/64  101/62  Pulse:  93  85  Resp:  19  17  Temp:  98.5 F (36.9 C)  98 F (36.7 C)  TempSrc:  Oral  Oral  SpO2: 99% 98%  92%  Weight:   76.1 kg   Height:        Wt Readings from Last 3 Encounters:  01/02/22 76.1 kg  12/12/21 89.1 kg  12/07/21 84 kg     Intake/Output Summary (Last 24 hours) at 01/02/2022 0856 Last data filed at 01/02/2022 0600 Gross per 24 hour  Intake 360 ml  Output 2300 ml  Net -1940 ml     Physical Exam  Awake but minimally confused, 3+ leg edema, right foot dorsal aspect skin under bandage, no focal deficits,  [place].AT,PERRAL Supple Neck, No JVD,   Symmetrical Chest wall movement, Good air  movement bilaterally, CTAB RRR,No Gallops, Rubs or new Murmurs,  +ve B.Sounds, Abd Soft, No tenderness,   No Cyanosis, Clubbing or edema    Data Review:    CBC Recent Labs  Lab 12/29/21 0243 12/30/21 0412 12/31/21 0346 12/31/21 1140 01/01/22 0440 01/02/22 0428  WBC 6.9 5.2 6.4  --  7.3 7.7  HGB 8.3* 7.2* 7.0* 8.9* 9.1* 9.1*  HCT 26.0* 21.7* 21.2* 27.0* 26.8* 27.0*  PLT 137* 101* 104*  --  98* 108*  MCV 104.8* 103.3* 102.4*  --  101.1* 101.9*  MCH 33.5 34.3* 33.8  --  34.3* 34.3*  MCHC 31.9 33.2 33.0  --  34.0 33.7  RDW 22.3* 22.0* 22.9*  --  23.2* 23.3*  LYMPHSABS  --  1.9 2.0  --  2.0 1.9  MONOABS  --  0.5 0.6  --  0.8 0.9  EOSABS  --  0.1 0.1  --  0.1 0.1  BASOSABS  --  0.1 0.1  --  0.1 0.1    Electrolytes Recent Labs  Lab 12/27/21 0717 12/27/21 0724 12/29/21 0243 12/30/21 0412 12/31/21 0346 01/01/22 0440 01/02/22 0428  NA  --    < > 136 137 136 139 134*  K  --    < > 4.0 3.5 3.2* 3.5 4.0  CL  --    < > 105 105 105 108 104  CO2  --    < > 19* 20* 21* 22 21*  GLUCOSE  --    < > 245* 308* 306* 245* 381*  BUN  --    < > 8 6 <5* <5* 8  CREATININE  --    < > 1.24* 1.16* 1.34* 1.17* 1.27*  CALCIUM  --    < > 9.2 9.7 10.0 10.0 9.5  AST  --   --  70* 51* 46* 50* 51*  ALT  --   --  '22 19 17 17 17  '$ ALKPHOS  --   --  137* 133* 137* 129* 141*  BILITOT  --   --  1.1 1.3* 1.5* 2.1* 2.2*  ALBUMIN  --   --  4.0 4.6 4.7 4.3 3.9  MG  --    < > 2.0 2.1 1.9 1.7 2.2  PROCALCITON 0.97  --   --   --   --   --   --   BNP  --   --   --  650.8* 802.2* 466.8* 405.7*   < > = values in this interval not displayed.   Radiology Reports No results found.

## 2022-01-02 NOTE — TOC Progression Note (Addendum)
Transition of Care [facility]) - Progression Note    Patient Details  Name: Anna Garcia MRN: [id number] Date of Birth: 02/25/1981  [facility] CM/SW [place], [place] Phone Number: 01/02/2022, 1:03 PM  Clinical Narrative:     CSW followed up with patient at bedside to discuss safe disposition plan for patient when patient is medically ready for dc. Patient reports she comes from home alone. Patient informed CSW unsure if her sister-n-law can provide supervision at home for her when medically ready for dc. CSW informed patient we want to have a safe dc plan for patient. Patient asked CSW about aide services coming into her home. CSW provided patient with number to DSS to call and see if eligible for aide services to come out to her home. CM informed patient that if patient eligible for aide services could take up to 30 days. CSW also provided resources for ALF if ever interested. Patient accepted. Patient confirmed she is going to call DSS today regarding aide services.Patient gave CSW permission to call her sister-n-law [name] regarding her dc plan. CSW called patients sister-n-law on speaker phone in patients room. Patients sister-n-law informed CSW unable to provide  supervision at home for patient due to work schedule. CSW informed patients sister-n-law that CSW provided patient with DSS number to call and see if eligible for aide services in her home, and resources for ALF if ever interested. CSW informed sister-n-law CM is working on setting up [facility] services for patient whenever she is medically ready for dc. Patients sister-in-law confirmed she can help check in on patient when able through phone call and will come out to her house when able . All questions answered. No further questions reported at this time. CSW will follow up with patient tomorrow. CSW will continue to follow and assist with patients dc planning needs.  Expected Discharge Plan: Home/Self Care Barriers to  Discharge: No Barriers Identified  Expected Discharge Plan and Services Expected Discharge Plan: Home/Self Care In-house Referral: Clinical Social Work Discharge Planning Services: CM Consult Post Acute Care Choice: [place] arrangements for the past 2 months: [place]                 DME Arranged: Bedside commode, Walker rolling DME Agency: [employer] Date DME Agency Contacted: 12/24/21 Time DME Agency Contacted: 79 Representative spoke with at DME Agency: [name] HH Arranged: PT, OT, RN, Disease Management, Nurse's Aide, Social Work [employer] Agency: [place] Date [place]: 12/24/21 Time [place]: 1405 Representative spoke with at [place]: [place] ([place]) Interventions    Readmission Risk Interventions    12/14/2021   10:09 AM 09/24/2021    1:14 PM  Readmission Risk Prevention Plan  Transportation Screening Complete   PCP or Specialist Appt within 3-5 Days Complete   HRI or [place] Complete Complete  Social Work Consult for [place] Planning/Counseling Complete Complete  Palliative Care Screening Not Applicable Not Applicable  Medication Review Press photographer) Complete

## 2022-01-02 NOTE — Progress Notes (Signed)
Mobility Specialist - Progress Note   01/02/22 1616  Mobility  Activity Ambulated with assistance in hallway  Level of Assistance Standby assist, set-up cues, supervision of patient - no hands on  Assistive Device None  Distance Ambulated (ft) 1000 ft  Activity Response Tolerated well  $Mobility charge 1 Mobility    During mobility: 134 HR Post-mobility:101 HR  Pt was received in bed and agreeable to mobility. No c/o pain throughout ambulation. Pt was returned to bed with all needs met.  Larey Seat

## 2022-01-03 DIAGNOSIS — R188 Other ascites: Secondary | ICD-10-CM | POA: Diagnosis not present

## 2022-01-03 DIAGNOSIS — K7460 Unspecified cirrhosis of liver: Secondary | ICD-10-CM | POA: Diagnosis not present

## 2022-01-03 LAB — CBC WITH DIFFERENTIAL/PLATELET
Abs Immature Granulocytes: 0 10*3/uL (ref 0.00–0.07)
Basophils Absolute: 0.2 10*3/uL — ABNORMAL HIGH (ref 0.0–0.1)
Basophils Relative: 3 %
Eosinophils Absolute: 0.1 10*3/uL (ref 0.0–0.5)
Eosinophils Relative: 2 %
HCT: 27.7 % — ABNORMAL LOW (ref 36.0–46.0)
Hemoglobin: 9.1 g/dL — ABNORMAL LOW (ref 12.0–15.0)
Lymphocytes Relative: 23 %
Lymphs Abs: 1.5 10*3/uL (ref 0.7–4.0)
MCH: 34.1 pg — ABNORMAL HIGH (ref 26.0–34.0)
MCHC: 32.9 g/dL (ref 30.0–36.0)
MCV: 103.7 fL — ABNORMAL HIGH (ref 80.0–100.0)
Monocytes Absolute: 0.3 10*3/uL (ref 0.1–1.0)
Monocytes Relative: 4 %
Neutro Abs: 4.4 10*3/uL (ref 1.7–7.7)
Neutrophils Relative %: 68 %
Platelets: 106 10*3/uL — ABNORMAL LOW (ref 150–400)
RBC: 2.67 MIL/uL — ABNORMAL LOW (ref 3.87–5.11)
RDW: 22.9 % — ABNORMAL HIGH (ref 11.5–15.5)
WBC: 6.4 10*3/uL (ref 4.0–10.5)
nRBC: 0 % (ref 0.0–0.2)
nRBC: 0 /100 WBC

## 2022-01-03 LAB — COMPREHENSIVE METABOLIC PANEL
ALT: 20 U/L (ref 0–44)
AST: 59 U/L — ABNORMAL HIGH (ref 15–41)
Albumin: 3.9 g/dL (ref 3.5–5.0)
Alkaline Phosphatase: 134 U/L — ABNORMAL HIGH (ref 38–126)
Anion gap: 8 (ref 5–15)
BUN: 7 mg/dL (ref 6–20)
CO2: 23 mmol/L (ref 22–32)
Calcium: 9.4 mg/dL (ref 8.9–10.3)
Chloride: 106 mmol/L (ref 98–111)
Creatinine, Ser: 0.96 mg/dL (ref 0.44–1.00)
GFR, Estimated: >60 mL/min (ref >60)
Glucose, Bld: 320 mg/dL — ABNORMAL HIGH (ref 70–99)
Potassium: 3.9 mmol/L (ref 3.5–5.1)
Sodium: 137 mmol/L (ref 135–145)
Total Bilirubin: 1.9 mg/dL — ABNORMAL HIGH (ref 0.3–1.2)
Total Protein: 6.2 g/dL — ABNORMAL LOW (ref 6.5–8.1)

## 2022-01-03 LAB — MAGNESIUM: Magnesium: 2 mg/dL (ref 1.7–2.4)

## 2022-01-03 LAB — BRAIN NATRIURETIC PEPTIDE: B Natriuretic Peptide: 442.1 pg/mL — ABNORMAL HIGH (ref 0.0–100.0)

## 2022-01-03 LAB — GLUCOSE, CAPILLARY
Glucose-Capillary: 330 mg/dL — ABNORMAL HIGH (ref 70–99)
Glucose-Capillary: 271 mg/dL — ABNORMAL HIGH (ref 70–99)
Glucose-Capillary: 292 mg/dL — ABNORMAL HIGH (ref 70–99)

## 2022-01-03 LAB — AMMONIA: Ammonia: 28 umol/L (ref 9–35)

## 2022-01-03 MED ORDER — SULFAMETHOXAZOLE-TRIMETHOPRIM 800-160 MG PO TABS
1.0000 | ORAL_TABLET | Freq: Every day | ORAL | Status: DC
  Administered 2022-01-03 – 2022-01-04 (×2): 1 via ORAL
  Filled 2022-01-03 (×3): qty 1

## 2022-01-03 MED ORDER — SPIRONOLACTONE 25 MG PO TABS
25.0000 mg | ORAL_TABLET | Freq: Every day | ORAL | Status: DC
  Administered 2022-01-04: 25 mg via ORAL
  Filled 2022-01-03: qty 1

## 2022-01-03 MED ORDER — INSULIN GLARGINE-YFGN 100 UNIT/ML ~~LOC~~ SOLN
20.0000 [IU] | Freq: Two times a day (BID) | SUBCUTANEOUS | Status: DC
  Administered 2022-01-03 – 2022-01-04 (×3): 20 [IU] via SUBCUTANEOUS
  Filled 2022-01-03 (×4): qty 0.2

## 2022-01-03 NOTE — Progress Notes (Signed)
Dressing to BLE were changed. Wounds are pink/ red with minimal drainage. New mepilex were applied to the tops of pts feet. Large Thigh High TED stockings were placed. Pt elevating feet on 2 pillows. Will CTM

## 2022-01-03 NOTE — Progress Notes (Signed)
PROGRESS NOTE                                                                                                                                                                                                             Patient Demographics:    Anna Garcia, is a 41 y.o. female, DOB - April 14, 1981, GLO:[id number]  Outpatient Primary MD for the patient is Day, [name], MD    LOS - 49  Admit date - 12/20/2021    Chief Complaint  Patient presents with   Leg Swelling       Brief Narrative (HPI from H&P)    41 y.o.  female with history of DM-1, gastroparesis, chronic pain syndrome/fibromyalgia, HTN, chronic urinary retention-self caths at home-who was just discharged from this facility on 8/29 after being treated for DKA/AKI/volume overload-presented back to the hospital with anasarca-3+ pitting edema in the lower extremity up to the thighs.  She was found to have SBP in Streptococcus bacteremia.  Further hospital course was complicated by development of encephalopathy and acute hypoxic respiratory failure likely due to aspiration pneumonia.  See below for further details.   Significant events: 8/22-8/29>> hospitalization for DKA/AKI-found to have liver cirrhosis 8/31>> admit for anasarca/significant lower extremity edema. 9/01>> IV albumin started-midodrine dosage escalated. 9/03>> hallucinating-confused. 9/05>> hypoxic after Ativan given for MRI brain-started on Ventimask.  Transfer to progressive care-subsequently titrated down to 3-4 L.   Significant studies: 2/23>>Hbs Ag, HCV JO:ACZYSAYT 8/24>> bilateral lower extremity Doppler: No DVT. 8/28>>ANA/RA/Anti mitrochondrial Ab/Anti smooth muscle KZ:SWFUXNAT 8/26>> Echo: EF-60-65%. 8/31>> CXR: No PNA 8/31>> CT chest/abdomen/pelvis: No PE, liver cirrhosis, prominent small bowel wall thickening, moderate volume ascites.  Generalized subcutaneous edema. 9/01>> UA: No  proteinuria. 9/02>> 24 urine copper: 21 9/3>> MRI brain: Limited images-but no acute CVA. 9/3>> EEG: No seizures. 9/3>> NH 4: 37 9/3>> vitamin B12: 1027. 9/5>> vitamin B1: Pending 9/6 >> awaiting copper stains.   Significant microbiology data: 9/01>>1/2 blood culture: Streptococcus anginosus 9/01>>ascites fluid culture: No growth 9/02>> blood culture: No growth.   Procedures: 9/01>> diagnostic paracentesis-1 L (WBC 590 w 85% neutrophils) 9/06>> liver biopsy by IR.     Subjective:   Patient in bed, appears comfortable, denies any headache, no fever, no chest pain or pressure, no shortness of breath , no abdominal pain. No new focal weakness.   Assessment  &  Plan :   Note patient is getting stable for discharge from medical standpoint however she still has intermittent episodes of confusion and delirium, there is no family or friends nearby to supervise her.  I have informed TOC team to look for safe disposition.  They were informed on 01/02/2022.  Of note delirium and mental status has now started to improve, if she continues to show improvement could be able to discharge her home based on her progress.   Anasarca due to decompensated liver cirrhosis: On Lasix and Aldactone gradually diuresing, still has significant volume overload, blood pressure is still limiting factor continue to monitor on combination of diuretics as above along with midodrine and albumin for blood pressure augmentation.  She still has considerable edema will request nephrology to assist with diuresis as her creatinine is starting to creep up, also aced on fluid restriction.  Of note urine had no proteinuria.   Filed Weights   12/30/21 1024 12/31/21 0511 01/02/22 0212  Weight: 77.8 kg 76.7 kg 76.1 kg     Liver cirrhosis: Diagnosed recently on imaging studies-hepatitis serology/autoimmune work-up negative.  Recent EGD June 2023 did not show any obvious varices.  Suspicion for NASH or Wilson's disease (low  ceruloplasmin levels but 24 urine copper not elevated).  Spoke with ophthalmologist-Dr. Patel-9/5-almost impossible to see KF rings without a slit-lamp exam-hence ophthalmology referral will need to be done in the outpatient setting.  Spoke with sister-in-law-[name]-no family history of liver issues that she is aware of.  Underwent liver biopsy on 9/6 - awaiting copper stains.  Thus far nonspecific cirrhotic changes.  Acute hypoxic respiratory failure likely due to aspiration pneumonia: Significantly better-on room air-last day of Rocephin today-we will be on Flagyl for a few more days.  Avoid benzodiazepines.    Acute metabolic encephalopathy: Likely due to delirium-work-up as above-significantly better over the past few days but still not at baseline. Although has hx of some ETOH use-not felt to have etoh withdrawal as she was readmitted within 1-2 days of discharge. No further recommendations from neurology-Dr. Bhagat-recommends continuing supportive care.  Continue Seroquel/melatonin.  No focal deficits no headache.   History of chronic urinary retention-s/p self-catheterization at home: Due to worsening encephalopathy-significant urinary retention-indwelling Foley catheter placed on 9/4-however this was pulled out by the patient a couple of nights ago.  Since mental status has improved-continue frequent bladder scans and in/out catheterization accordingly.  Note patient has been doing intermittent self-catheterization at home from time to time as well.  Streptococcus bacteremia/spontaneous bacterial peritonitis: Afebrile-clinically improved-last day of Rocephin 12/28/21, discussed with ID Dr. Linus Salmons on 12/31/2021 on SBP prophylaxis duration and course, since 1 out of 2 blood cultures grew strep he has been treated with Keflex appropriately, will give trial of oral Bactrim for indefinite SBP prophylaxis, monitor closely.  Remains on midodrine for soft blood pressure-was on IV albumin for several days during  the earlier part of the hospital stay.   Cellulitis right foot: Improved-continue IV antibiotics, now on 5 days of doxycycline starting 12/29/2021.  Wound care continue.   AKI: Likely mild hemodynamically mediated kidney injury-cautiously continue with diuretics as she remains volume overloaded.    Hypokalemia: Replete and recheck.   Macrocytic anemia: S/p 1 unit of PRBC on 9/4-no evidence of hemolysis (LDH normal)-no evidence of blood loss.  Suspect this could be related to acute illness and frequent blood draws, 1 more unit of packed RBC transfusion on 12/31/2021 continue to monitor.   History of esophageal stricture: S/p dilatation June  2023.  Denies any vomiting or dysphagia symptoms.   History of diabetic gastroparesis: No vomiting-appears stable-continue Reglan.  Encourage small portion meals.  HLD: Continue statin   Moderate persistent asthma: Not in flare-continue bronchodilators.  Chronic pain/fibromyalgia/neuropathy: Continue Neurontin.   DM-1 (A1c 7.6 on 7/30): On Semglee and sliding scale dose adjusted on 01/03/2022.  CBG (last 3)  Recent Labs    01/02/22 1629 01/02/22 2130 01/03/22 0817  GLUCAP 324* 356* 330*   Lab Results  Component Value Date   HGBA1C 7.6 (H) 11/18/2021        Condition - Extremely Guarded  Family Communication  : Discussed with sister-in-law [name] [phone] closest family member on 01/02/2022.  Code Status :  Full  Consults  :  GI, IR, ID, Renal  PUD Prophylaxis : PPI   Procedures  :            Disposition Plan  :    Status is: Inpatient  DVT Prophylaxis  :    Place TED hose Start: 01/03/22 0845 enoxaparin (LOVENOX) injection 40 mg Start: 12/21/21 1000   Lab Results  Component Value Date   PLT 106 (L) 01/03/2022    Diet :  Diet Order             Diet heart healthy/carb modified Room service appropriate? No; Fluid consistency: Thin; Fluid restriction: 1200 mL Fluid  Diet effective now                     Inpatient Medications  Scheduled Meds:  enoxaparin (LOVENOX) injection  40 mg Subcutaneous Q24H   famotidine  40 mg Oral BID   feeding supplement  237 mL Oral BID BM   folic acid  2 mg Oral Daily   furosemide  40 mg Oral Daily   gabapentin  300 mg Oral BID   insulin aspart  0-5 Units Subcutaneous QHS   insulin aspart  0-9 Units Subcutaneous TID WC   insulin glargine-yfgn  20 Units Subcutaneous BID   lactulose  10 g Oral BID   lipase/protease/amylase  36,000 Units Oral TID WC   melatonin  5 mg Oral QHS   metoCLOPramide  10 mg Oral TID AC   midodrine  10 mg Oral TID WC   mometasone-formoterol  2 puff Inhalation BID   montelukast  10 mg Oral QHS   multivitamin with minerals  1 tablet Oral Daily   pantoprazole  40 mg Oral Daily   QUEtiapine  50 mg Oral QHS   rosuvastatin  20 mg Oral QHS   sodium chloride flush  3 mL Intravenous Q12H   [START ON 01/04/2022] spironolactone  25 mg Oral Daily   tamsulosin  0.4 mg Oral Daily   triamcinolone   Topical BID   Continuous Infusions:   PRN Meds:.acetaminophen, albuterol, alum & mag hydroxide-simeth, influenza vac split quadrivalent PF, loperamide, naLOXone (NARCAN)  injection, mouth rinse, traMADol  Time Spent in minutes  30   Lala Lund M.D on 01/03/2022 at 8:51 AM  To page go to [email or web address]   [facility] -  Office  [phone]  See all Orders from today for further details    Objective:   Vitals:   01/02/22 0807 01/02/22 1212 01/02/22 1940 01/03/22 0826  BP: 101/62 105/64    Pulse: 85 87    Resp: 17 17    Temp: 98 F (36.7 C) 98.3 F (36.8 C)    TempSrc: Oral Oral    SpO2: 92% 98% 98% 97%  Weight:      Height:        Wt Readings from Last 3 Encounters:  01/02/22 76.1 kg  12/12/21 89.1 kg  12/07/21 84 kg     Intake/Output Summary (Last 24 hours) at 01/03/2022 0851 Last data filed at 01/02/2022 1524 Gross per 24 hour  Intake 360 ml  Output 900 ml  Net -540 ml     Physical Exam  Awake & now  alert, 2+ leg edema, right foot dorsal aspect skin under bandage, no focal deficits,  [place].AT,PERRAL Supple Neck, No JVD,   Symmetrical Chest wall movement, Good air movement bilaterally, CTAB RRR,No Gallops, Rubs or new Murmurs,  +ve B.Sounds, Abd Soft, No tenderness,        Data Review:    CBC Recent Labs  Lab 12/30/21 0412 12/31/21 0346 12/31/21 1140 01/01/22 0440 01/02/22 0428 01/03/22 0302  WBC 5.2 6.4  --  7.3 7.7 6.4  HGB 7.2* 7.0* 8.9* 9.1* 9.1* 9.1*  HCT 21.7* 21.2* 27.0* 26.8* 27.0* 27.7*  PLT 101* 104*  --  98* 108* 106*  MCV 103.3* 102.4*  --  101.1* 101.9* 103.7*  MCH 34.3* 33.8  --  34.3* 34.3* 34.1*  MCHC 33.2 33.0  --  34.0 33.7 32.9  RDW 22.0* 22.9*  --  23.2* 23.3* 22.9*  LYMPHSABS 1.9 2.0  --  2.0 1.9 1.5  MONOABS 0.5 0.6  --  0.8 0.9 0.3  EOSABS 0.1 0.1  --  0.1 0.1 0.1  BASOSABS 0.1 0.1  --  0.1 0.1 0.2*    Electrolytes Recent Labs  Lab 12/30/21 0412 12/31/21 0346 01/01/22 0440 01/02/22 0428 01/02/22 0947 01/03/22 0302  NA 137 136 139 134*  --  137  K 3.5 3.2* 3.5 4.0  --  3.9  CL 105 105 108 104  --  106  CO2 20* 21* 22 21*  --  23  GLUCOSE 308* 306* 245* 381*  --  320*  BUN 6 <5* <5* 8  --  7  CREATININE 1.16* 1.34* 1.17* 1.27*  --  0.96  CALCIUM 9.7 10.0 10.0 9.5  --  9.4  AST 51* 46* 50* 51*  --  59*  ALT '19 17 17 17  '$ --  20  ALKPHOS 133* 137* 129* 141*  --  134*  BILITOT 1.3* 1.5* 2.1* 2.2*  --  1.9*  ALBUMIN 4.6 4.7 4.3 3.9  --  3.9  MG 2.1 1.9 1.7 2.2  --  2.0  AMMONIA  --   --   --   --  41* 28  BNP 650.8* 802.2* 466.8* 405.7*  --  442.1*   Radiology Reports No results found.

## 2022-01-03 NOTE — Progress Notes (Addendum)
Pharmacy Antibiotic Note  Anna Garcia is a 41 y.o. female s/p treatment for spontaneous bacterial peritonitis and noted with cirrhosis. Pharmacy has been consulted for bactrim dosing for SBP prophylaxis (this will be a chronic medication) -CrCl ~ 75, K= 3.9  Plan: -Bactrim one double strength tab once daily -Anticipate no medication adjustments will be needed -Watch potassium levels with use of bactrim and spironolactone -Will sign off. Please contact pharmacy with any other needs.  Thank you Hildred Laser, PharmD Clinical Pharmacist **Pharmacist phone directory can now be found on [email or web address] ([email or web address]).  Listed under [place].

## 2022-01-03 NOTE — Plan of Care (Signed)

## 2022-01-03 NOTE — Progress Notes (Signed)
Occupational Therapy Treatment Patient Details Name: Anna Garcia MRN: [id number] DOB: 29-Oct-1980 Today's Date: 01/03/2022   History of present illness Pt is a 41 y/o female admitted secondary to LE swelling, RLE cellulitis, and liver cirrhosis. Liver biopsy 9/6. PMH includes DM, alcohol abuse, fibromyalgia, chronic pain syndrome, HTN, chronic urinary retention with self caths.   OT comments  Patient ambulating in her room upon entry.  Demonstration provided on tub bench transfer and patient was able to return demonstration with verbal cues. Discussed bathroom setup at home and benefit of rail in shower to assist with standing, stability, and safety. Performed mobility in hallway without an assistive device and min guard for safety. Patient has made good gains and demonstrated good understanding of tub bench use for transfers. Discharge recommendations continue to be appropriate.    Recommendations for follow up therapy are one component of a multi-disciplinary discharge planning process, led by the attending physician.  Recommendations may be updated based on patient status, additional functional criteria and insurance authorization.    Follow Up Recommendations  No OT follow up    Assistance Recommended at Discharge Set up Supervision/Assistance  Patient can return home with the following  A little help with walking and/or transfers;A little help with bathing/dressing/bathroom;Assistance with cooking/housework;Assist for transportation   Equipment Recommendations  Tub/shower seat    Recommendations for Other Services      Precautions / Restrictions Precautions Precautions: None Restrictions Weight Bearing Restrictions: No       Mobility Bed Mobility Overal bed mobility: Needs Assistance             General bed mobility comments: standing upon entry, seated on EOB at end of session.    Transfers Overall transfer level: Independent Equipment used: None Transfers: Sit  to/from Stand Sit to Stand: Independent           General transfer comment: independent with mobility in room without an assistive device     Balance Overall balance assessment: Modified Independent Sitting-balance support: No upper extremity supported, Feet supported Sitting balance-Leahy Scale: Good     Standing balance support: No upper extremity supported, During functional activity Standing balance-Leahy Scale: Good Standing balance comment: supervision for safety                           ADL either performed or assessed with clinical judgement   ADL Overall ADL's : Needs assistance/impaired                                 Tub/ Shower Transfer: Supervision/safety;Tub bench Tub/Shower Transfer Details (indicate cue type and reason): demonstration provided on tub bench transfer and patient was able to return demonstration with verbal cues   General ADL Comments: focused on tub bench transfers    Extremity/Trunk Assessment              Vision       Perception     Praxis      Cognition Arousal/Alertness: Awake/alert Behavior During Therapy: WFL for tasks assessed/performed Overall Cognitive Status: Within Functional Limits for tasks assessed                                 General Comments: able to recall therapist from previous visit. Demonstrated good understanding of tub bench use  Exercises      Shoulder Instructions       General Comments      Pertinent Vitals/ Pain       Pain Assessment Pain Assessment: Faces Faces Pain Scale: Hurts a little bit Pain Location: Bilateral feet Pain Descriptors / Indicators: Discomfort, Guarding, Grimacing Pain Intervention(s): Monitored during session, Repositioned  Home Living                                          Prior Functioning/Environment              Frequency  Min 2X/week        Progress Toward Goals  OT  Goals(current goals can now be found in the care plan section)  Progress towards OT goals: Progressing toward goals  Acute Rehab OT Goals Patient Stated Goal: go home OT Goal Formulation: With patient Time For Goal Achievement: 01/04/22 Potential to Achieve Goals: Good ADL Goals Pt Will Perform Grooming: with set-up;standing Pt Will Perform Lower Body Bathing: with modified independence;sit to/from stand Pt Will Perform Lower Body Dressing: with min assist;sit to/from stand;with adaptive equipment Pt Will Transfer to Toilet: with modified independence;ambulating;regular height toilet Pt Will Perform Tub/Shower Transfer: Tub transfer;ambulating;grab bars;rolling walker Additional ADL Goal #1: Patient will recite 3 strategies for energy conservation to promote independence at discharge.  Plan Discharge plan remains appropriate    Co-evaluation                 AM-PAC OT "6 Clicks" Daily Activity     Outcome Measure   Help from another person eating meals?: None Help from another person taking care of personal grooming?: None Help from another person toileting, which includes using toliet, bedpan, or urinal?: None Help from another person bathing (including washing, rinsing, drying)?: A Little Help from another person to put on and taking off regular upper body clothing?: None Help from another person to put on and taking off regular lower body clothing?: A Little 6 Click Score: 22    End of Session Equipment Utilized During Treatment: Other (comment) (tub bench)  OT Visit Diagnosis: Unsteadiness on feet (R26.81);History of falling (Z91.81);Pain Pain - Right/Left: Right Pain - part of body: Leg   Activity Tolerance Patient tolerated treatment well   Patient Left in bed;with call bell/phone within reach   Nurse Communication Mobility status        Time: 9242-6834 OT Time Calculation (min): 18 min  Charges: OT General Charges $OT Visit: 1 Visit OT  Treatments $Self Care/Home Management : 8-22 mins  Lodema Hong, [place]  Office [place] 01/03/2022, 12:21 PM

## 2022-01-03 NOTE — Inpatient Diabetes Management (Signed)
Inpatient Diabetes Program Recommendations  AACE/ADA: New Consensus Statement on Inpatient Glycemic Control (2015)  Target Ranges:  Prepandial:   less than 140 mg/dL      Peak postprandial:   less than 180 mg/dL (1-2 hours)      Critically ill patients:  140 - 180 mg/dL   Lab Results  Component Value Date   GLUCAP 330 (H) 01/03/2022   HGBA1C 7.6 (H) 11/18/2021    Review of Glycemic Control  Diabetes history: DM1 Outpatient Diabetes medications: Lantus 8 units BID, Apidra 0-8 units SSI and carbohydrates Current orders for Inpatient glycemic control: Semglee 20 units BID, Novolog 0-9 units TID with meals and 0-5 HS   Post-prandials elevated.   Inpatient Diabetes Program Recommendations:     Recommend adding Novolog 3 units TID with meals if patient is eating at least 50% of meals and if blood sugars continue to be elevated.   Continue to follow.  Thanks, Bronson Curb, MSN, RNC-OB Diabetes Coordinator [phone] (8a-5p)

## 2022-01-03 NOTE — Plan of Care (Signed)

## 2022-01-04 ENCOUNTER — Other Ambulatory Visit (HOSPITAL_COMMUNITY): Payer: Self-pay

## 2022-01-04 DIAGNOSIS — R188 Other ascites: Secondary | ICD-10-CM | POA: Diagnosis not present

## 2022-01-04 DIAGNOSIS — K7460 Unspecified cirrhosis of liver: Secondary | ICD-10-CM | POA: Diagnosis not present

## 2022-01-04 LAB — CBC WITH DIFFERENTIAL/PLATELET
Abs Immature Granulocytes: 0.02 10*3/uL (ref 0.00–0.07)
Basophils Absolute: 0.1 10*3/uL (ref 0.0–0.1)
Basophils Relative: 1 %
Eosinophils Absolute: 0.1 10*3/uL (ref 0.0–0.5)
Eosinophils Relative: 2 %
HCT: 26.6 % — ABNORMAL LOW (ref 36.0–46.0)
Hemoglobin: 9 g/dL — ABNORMAL LOW (ref 12.0–15.0)
Immature Granulocytes: 0 %
Lymphocytes Relative: 27 %
Lymphs Abs: 1.6 10*3/uL (ref 0.7–4.0)
MCH: 34.5 pg — ABNORMAL HIGH (ref 26.0–34.0)
MCHC: 33.8 g/dL (ref 30.0–36.0)
MCV: 101.9 fL — ABNORMAL HIGH (ref 80.0–100.0)
Monocytes Absolute: 0.8 10*3/uL (ref 0.1–1.0)
Monocytes Relative: 12 %
Neutro Abs: 3.5 10*3/uL (ref 1.7–7.7)
Neutrophils Relative %: 58 %
Platelets: 108 10*3/uL — ABNORMAL LOW (ref 150–400)
RBC: 2.61 MIL/uL — ABNORMAL LOW (ref 3.87–5.11)
RDW: 22.6 % — ABNORMAL HIGH (ref 11.5–15.5)
WBC: 6.1 10*3/uL (ref 4.0–10.5)
nRBC: 0 % (ref 0.0–0.2)

## 2022-01-04 LAB — COMPREHENSIVE METABOLIC PANEL
ALT: 19 U/L (ref 0–44)
AST: 54 U/L — ABNORMAL HIGH (ref 15–41)
Albumin: 3.8 g/dL (ref 3.5–5.0)
Alkaline Phosphatase: 124 U/L (ref 38–126)
Anion gap: 10 (ref 5–15)
BUN: 10 mg/dL (ref 6–20)
CO2: 22 mmol/L (ref 22–32)
Calcium: 9.5 mg/dL (ref 8.9–10.3)
Chloride: 105 mmol/L (ref 98–111)
Creatinine, Ser: 1.2 mg/dL — ABNORMAL HIGH (ref 0.44–1.00)
GFR, Estimated: 59 mL/min — ABNORMAL LOW (ref >60)
Glucose, Bld: 279 mg/dL — ABNORMAL HIGH (ref 70–99)
Potassium: 3.6 mmol/L (ref 3.5–5.1)
Sodium: 137 mmol/L (ref 135–145)
Total Bilirubin: 1.2 mg/dL (ref 0.3–1.2)
Total Protein: 5.9 g/dL — ABNORMAL LOW (ref 6.5–8.1)

## 2022-01-04 LAB — MAGNESIUM: Magnesium: 1.9 mg/dL (ref 1.7–2.4)

## 2022-01-04 LAB — BRAIN NATRIURETIC PEPTIDE: B Natriuretic Peptide: 405.2 pg/mL — ABNORMAL HIGH (ref 0.0–100.0)

## 2022-01-04 LAB — GLUCOSE, CAPILLARY
Glucose-Capillary: 231 mg/dL — ABNORMAL HIGH (ref 70–99)
Glucose-Capillary: 278 mg/dL — ABNORMAL HIGH (ref 70–99)

## 2022-01-04 MED ORDER — INSULIN PEN NEEDLE 32G X 4 MM MISC
0 refills | Status: DC
  Filled 2022-01-04: qty 100, 30d supply, fill #0

## 2022-01-04 MED ORDER — TAMSULOSIN HCL 0.4 MG PO CAPS
0.4000 mg | ORAL_CAPSULE | Freq: Every day | ORAL | 0 refills | Status: DC
  Filled 2022-01-04: qty 30, 30d supply, fill #0

## 2022-01-04 MED ORDER — MIDODRINE HCL 10 MG PO TABS
10.0000 mg | ORAL_TABLET | Freq: Three times a day (TID) | ORAL | 0 refills | Status: DC
  Filled 2022-01-04: qty 90, 30d supply, fill #0

## 2022-01-04 MED ORDER — SULFAMETHOXAZOLE-TRIMETHOPRIM 800-160 MG PO TABS
1.0000 | ORAL_TABLET | Freq: Every day | ORAL | 0 refills | Status: DC
  Filled 2022-01-04: qty 30, 30d supply, fill #0

## 2022-01-04 MED ORDER — INSULIN LISPRO (1 UNIT DIAL) 100 UNIT/ML (KWIKPEN)
PEN_INJECTOR | SUBCUTANEOUS | 0 refills | Status: AC
  Filled 2022-01-04: qty 9, 30d supply, fill #0

## 2022-01-04 MED ORDER — FOLIC ACID 1 MG PO TABS
1.0000 mg | ORAL_TABLET | Freq: Every day | ORAL | 0 refills | Status: DC
  Filled 2022-01-04: qty 30, 30d supply, fill #0

## 2022-01-04 MED ORDER — QUETIAPINE FUMARATE 25 MG PO TABS
25.0000 mg | ORAL_TABLET | Freq: Every day | ORAL | 0 refills | Status: DC
  Filled 2022-01-04: qty 30, 30d supply, fill #0

## 2022-01-04 MED ORDER — PANTOPRAZOLE SODIUM 40 MG PO TBEC
40.0000 mg | DELAYED_RELEASE_TABLET | Freq: Every day | ORAL | 0 refills | Status: DC
  Filled 2022-01-04: qty 30, 30d supply, fill #0

## 2022-01-04 MED ORDER — SPIRONOLACTONE 25 MG PO TABS
25.0000 mg | ORAL_TABLET | Freq: Every day | ORAL | 0 refills | Status: DC
  Filled 2022-01-04: qty 30, 30d supply, fill #0

## 2022-01-04 MED ORDER — FUROSEMIDE 40 MG PO TABS
40.0000 mg | ORAL_TABLET | Freq: Every day | ORAL | 0 refills | Status: DC
  Filled 2022-01-04: qty 30, 30d supply, fill #0

## 2022-01-04 NOTE — Care Management Important Message (Signed)
Important Message  Patient Details  Name: Anna Garcia MRN: [id number] Date of Birth: [birth year]   Medicare Important Message Given:  Yes     Shelda Altes 01/04/2022, 8:59 AM

## 2022-01-04 NOTE — Progress Notes (Signed)
Mobility Specialist - Progress Note   01/04/22 0930  Mobility  Activity Ambulated with assistance in hallway  Level of Assistance Standby assist, set-up cues, supervision of patient - no hands on  Assistive Device None  Distance Ambulated (ft) 700 ft  Activity Response Tolerated well  $Mobility charge 1 Mobility    Pt was received in bed and agreeable mobility. No complaints throughout ambulation. Pt was returned to bed with all needs met.      Mobility Specialist  

## 2022-01-04 NOTE — Discharge Summary (Signed)
Anna Garcia YSA:[id number] DOB: 1980-12-31 DOA: 12/20/2021  PCP: [name], MD  Admit date: 12/20/2021  Discharge date: 01/04/2022  Admitted From: Home   Disposition:  Home   Recommendations for Outpatient Follow-up:   Follow up with PCP in 1-2 weeks  PCP Please obtain BMP/CBC, 2 view CXR in 1week,  (see Discharge instructions)   PCP Please follow up on the following pending results: Later weight, CBC, CMP, magnesium levels closely.  Needs close outpatient nephrology and GI follow-up within 1 to 2 weeks of discharge.   Home Health: None needed   Equipment/Devices: as below  Consultations: Nephrology, psychiatry, GI, IR Discharge Condition: Stable    CODE STATUS: Full    Diet Recommendation: Heart Healthy low carbohydrate diet with strict 1200 cc fluid restriction per day    Chief Complaint  Patient presents with   Leg Swelling     Brief history of present illness from the day of admission and additional interim summary    41 y.o.  female with history of DM-1, gastroparesis, chronic pain syndrome/fibromyalgia, HTN, chronic urinary retention-self caths at home-who was just discharged from this facility on 8/29 after being treated for DKA/AKI/volume overload-presented back to the hospital with anasarca-3+ pitting edema in the lower extremity up to the thighs.  She was found to have SBP in Streptococcus bacteremia.  Further hospital course was complicated by development of encephalopathy and acute hypoxic respiratory failure likely due to aspiration pneumonia.  See below for further details.   Significant events: 8/22-8/29>> hospitalization for DKA/AKI-found to have liver cirrhosis 8/31>> admit for anasarca/significant lower extremity edema. 9/01>> IV albumin started-midodrine dosage escalated. 9/03>>  hallucinating-confused. 9/05>> hypoxic after Ativan given for MRI brain-started on Ventimask.  Transfer to progressive care-subsequently titrated down to 3-4 L.   Significant studies: 2/23>>Hbs Ag, HCV NA:TFTDDUKG 8/24>> bilateral lower extremity Doppler: No DVT. 8/28>>ANA/RA/Anti mitrochondrial Ab/Anti smooth muscle UR:KYHCWCBJ 8/26>> Echo: EF-60-65%. 8/31>> CXR: No PNA 8/31>> CT chest/abdomen/pelvis: No PE, liver cirrhosis, prominent small bowel wall thickening, moderate volume ascites.  Generalized subcutaneous edema. 9/01>> UA: No proteinuria. 9/02>> 24 urine copper: 21 9/3>> MRI brain: Limited images-but no acute CVA. 9/3>> EEG: No seizures. 9/3>> NH 4: 37 9/3>> vitamin B12: 1027. 9/5>> vitamin B1: Pending 9/6 >> awaiting copper stains.   Significant microbiology data: 9/01>>1/2 blood culture: Streptococcus anginosus 9/01>>ascites fluid culture: No growth 9/02>> blood culture: No growth.   Procedures: 9/01>> diagnostic paracentesis-1 L (WBC 590 w 85% neutrophils) 9/06>> liver biopsy by IR.                                                                   Hospital Course   Anasarca due to decompensated liver cirrhosis: On Lasix and Aldactone gradually diuresing, still has significant volume overload, blood pressure is still limiting factor  continue to monitor on combination of diuretics as above along with midodrine and albumin for blood pressure augmentation.  He was also seen by nephrologist Dr. Johnney Ou and her diuretic dose were adjusted.  She is doing much better clinically from anasarca standpoint and will be discharged home on below mentioned diuretic dose and fluid restriction with close outpatient follow-up with PCP and nephrology.  Liver cirrhosis: Diagnosed recently on imaging studies-hepatitis serology/autoimmune work-up negative.  Recent EGD June 2023 did not show any obvious varices.  Suspicion for NASH or Wilson's disease (low ceruloplasmin levels but 24 urine copper  not elevated).  Spoke with ophthalmologist-Dr. Patel-9/5-almost impossible to see KF rings without a slit-lamp exam-hence ophthalmology referral will need to be done in the outpatient setting.  Spoke with sister-in-law-[name]-no family history of liver issues that she is aware of.  Underwent liver biopsy on 9/6 - awaiting copper stains.  Thus far nonspecific cirrhotic changes.  Will require 1 week [facility] follow-up postdischarge.   Acute hypoxic respiratory failure likely due to aspiration pneumonia: Significantly better-on room air-last day of Rocephin today-we will be on Flagyl for a few more days.  Avoid benzodiazepines.    Acute metabolic encephalopathy: Likely due to delirium-work-up as above-significantly better over the past few days but still not at baseline. Although has hx of some ETOH use-not felt to have etoh withdrawal as she was readmitted within 1-2 days of discharge. No further recommendations from neurology-Dr. Bhagat-recommends continuing supportive care.  Clear much improved low-dose nighttime Seroquel continued.  Now mentation back to baseline.   History of chronic urinary retention-s/p self-catheterization at home: Due to worsening encephalopathy-significant urinary retention-indwelling Foley catheter placed on 9/4-however this was pulled out by the patient a couple of nights ago.  Since mental status has improved-continue frequent bladder scans and in/out catheterization accordingly.  Note patient has been doing intermittent self-catheterization at home from time to time as well.  Has been placed on Flomax recommended to continue doing intermittent self caths as before.   Streptococcus bacteremia/spontaneous bacterial peritonitis: Afebrile-clinically improved-last day of Rocephin 12/28/21, discussed with ID Dr. Linus Salmons on 12/31/2021 on SBP prophylaxis duration and course, since 1 out of 2 blood cultures grew strep he has been treated with Keflex appropriately, will give trial of oral  Bactrim for indefinite SBP prophylaxis, monitor closely.    Cellulitis right foot: Resolved clinically had brief course of IV antibiotic now on Bactrim for SBP prophylaxis indefinitely.   AKI: Likely mild hemodynamically mediated kidney injury-cautiously continue with diuretics as she remains volume overloaded.  Renal function much stable PCP to monitor electrolytes also recommend 1 week postdischarge nephrology follow-up.    Macrocytic anemia: S/p 1 unit of PRBC on 9/4-no evidence of hemolysis (LDH normal)-no evidence of blood loss.  Suspect this could be related to acute illness and frequent blood draws, 1 more unit of packed RBC transfusion on 12/31/2021, H&H stable PCP to continue to monitor outpatient GI follow-up as well post discharge.  Request PCP to do outpatient macrocytic anemia work-up.   History of esophageal stricture: S/p dilatation June 2023.  Denies any vomiting or dysphagia symptoms.   History of diabetic gastroparesis: No vomiting-appears stable-continue Reglan.  Encourage small portion meals.   HLD: Continue statin   Moderate persistent asthma: Not in flare-continue bronchodilators.   Chronic pain/fibromyalgia/neuropathy: Continue Neurontin.    DM-1 (A1c 7.6 on 7/30): On Lantus and sliding scale dose adjusted PCP to monitor CBGs.   Discharge diagnosis     Principal Problem:   Cirrhosis  of liver with ascites, unspecified hepatic cirrhosis type ([facility]) Active Problems:   Hypotension   Cellulitis of foot, right   Type 1 diabetes mellitus (HCC)   Sinus tachycardia   Moderate persistent asthma   Macrocytic anemia   Generalized weakness   Alcohol use   Chronic urinary retention   Anasarca   Encephalopathy acute   Sepsis (HCC)   SBP (spontaneous bacterial peritonitis) [facility])    Discharge instructions    Discharge Instructions     Discharge instructions   Complete by: As directed    Follow with Primary MD Day, [name], MD in 7 days kindly follow-up with the  requested nephrologist and gastroenterologist within 1 to 2 weeks  Get CBC, CMP, Magnesium -  checked next visit within 1 week by Primary MD   Activity: As tolerated with Full fall precautions use walker/cane & assistance as needed  Disposition Home   Diet: Heart Healthy Low Carb with strict 1200 cc of total fluid restriction per day.  Accuchecks 4 times/day, Once in AM empty stomach and then before each meal. Log in all results and show them to your Prim.MD in 3 days. If any glucose reading is under 80 or above 300 call your Prim MD immidiately. Follow Low glucose instructions for glucose under 80 as instructed.   Special Instructions: If you have smoked or chewed Tobacco  in the last 2 yrs please stop smoking, stop any regular Alcohol  and or any Recreational drug use.  On your next visit with your primary care physician please Get Medicines reviewed and adjusted.  Please request your Prim.MD to go over all Hospital Tests and Procedure/Radiological results at the follow up, please get all Hospital records sent to your Prim MD by signing hospital release before you go home.  If you experience worsening of your admission symptoms, develop shortness of breath, life threatening emergency, suicidal or homicidal thoughts you must seek medical attention immediately by calling 911 or calling your MD immediately  if symptoms less severe.  You Must read complete instructions/literature along with all the possible adverse reactions/side effects for all the Medicines you take and that have been prescribed to you. Take any new Medicines after you have completely understood and accpet all the possible adverse reactions/side effects.   Increase activity slowly   Complete by: As directed    No wound care   Complete by: As directed        Discharge Medications   Allergies as of 01/04/2022   No Known Allergies      Medication List     STOP taking these medications    Apidra SoloStar 100  UNIT/ML Solostar Pen Generic drug: insulin glulisine   Dexilant 60 MG capsule Generic drug: dexlansoprazole   diphenhydrAMINE 25 mg capsule Commonly known as: Benadryl Allergy   famotidine 40 MG tablet Commonly known as: PEPCID   granisetron 1 MG tablet Commonly known as: KYTRIL   hydrocortisone cream 0.5 %   Lantus SoloStar 100 UNIT/ML Solostar Pen Generic drug: insulin glargine       TAKE these medications    Baqsimi Two Pack 3 MG/DOSE Powd Generic drug: Glucagon Place 1 spray into both nostrils as needed (for a very low blood sugar emergency, when unable to eat or drink and need help from someone else- or as otherwise directed).   dicyclomine 10 MG capsule Commonly known as: BENTYL Take 1 capsule (10 mg total) by mouth 4 (four) times daily -  before meals and at  bedtime.   FeroSul 325 (65 FE) MG tablet Generic drug: ferrous sulfate Take 1 tablet by mouth daily with breakfast.   folic acid 1 MG tablet Commonly known as: FOLVITE Take 1 tablet (1 mg total) by mouth daily. Start taking on: January 05, 2022   furosemide 40 MG tablet Commonly known as: LASIX Take 1 tablet (40 mg total) by mouth daily.   gabapentin 300 MG capsule Commonly known as: NEURONTIN Take 1 capsule (300 mg total) by mouth 3 (three) times daily.   insulin lispro 100 UNIT/ML KwikPen Commonly known as: HumaLOG KwikPen Before each meal 3 times a day, 140-199 - 2 units, 200-250 - 4 units, 251-299 - 6 units,  300-349 - 8 units,  350 or above 10 units. Insulin PEN if approved, provide syringes and needles if needed.Please switch to any approved short acting Insulin if needed.   lipase/protease/amylase 36000 UNITS Cpep capsule Commonly known as: CREON Take 1 capsule (36,000 Units total) by mouth 3 (three) times daily with meals.   loperamide 2 MG capsule Commonly known as: IMODIUM Take 1 capsule (2 mg total) by mouth as needed for diarrhea or loose stools. What changed: when to take this    metoCLOPramide 10 MG tablet Commonly known as: REGLAN Take 1 tablet by mouth every 6 hours as needed for nausea.   midodrine 10 MG tablet Commonly known as: PROAMATINE Take 1 tablet (10 mg total) by mouth 3 (three) times daily with meals. What changed:  medication strength how much to take   mometasone-formoterol 100-5 MCG/ACT Aero Commonly known as: DULERA Inhale 2 puffs into the lungs 2 (two) times daily.   montelukast 10 MG tablet Commonly known as: SINGULAIR Take 10 mg by mouth at bedtime.   Motegrity 1 MG Tabs Generic drug: Prucalopride Succinate Take 1 mg by mouth daily.   pantoprazole 40 MG tablet Commonly known as: PROTONIX Take 1 tablet (40 mg total) by mouth daily. Start taking on: January 05, 2022   QUEtiapine 25 MG tablet Commonly known as: SEROQUEL Take 1 tablet (25 mg total) by mouth at bedtime.   rosuvastatin 20 MG tablet Commonly known as: CRESTOR Take 20 mg by mouth at bedtime.   spironolactone 25 MG tablet Commonly known as: ALDACTONE Take 1 tablet (25 mg total) by mouth daily.   sulfamethoxazole-trimethoprim 800-160 MG tablet Commonly known as: BACTRIM DS Take 1 tablet by mouth daily.   tamsulosin 0.4 MG Caps capsule Commonly known as: FLOMAX Take 1 capsule (0.4 mg total) by mouth daily.               Durable Medical Equipment  (From admission, onward)           Start     Ordered   01/02/22 1159  For home use only DME Tub bench  Once        01/02/22 1158   12/24/21 1434  For home use only DME Walker rolling  Once       Question Answer Comment  Walker: With [place] Wheels   Patient needs a walker to treat with the following condition Gait instability      12/24/21 1434   12/24/21 1434  For home use only DME Bedside commode  Once       Question:  Patient needs a bedside commode to treat with the following condition  Answer:  Weakness   12/24/21 1434             Contact information for follow-up providers  Care,  [facility] Follow up.   Specialty: Home Health Services Why: Registered Nurse, Social Worker, Aide-office to call for visit times. Contact information: [address] [place] [address] [phone]         [facility], [facility] Follow up.   Why: tub bench and rolling walker to be delivered to the room prior to d/c. Contact information: [address] [place] [address] [place], [place], [place]. Schedule an appointment as soon as possible for a visit.   Why: for follow up of cirrhosis (liver disease) Contact information: [address] [place] [address] [phone]         Lavena Bullion, DO. Schedule an appointment as soon as possible for a visit in 1 week(s).   Specialty: Gastroenterology Contact information: [place] [address] [phone]         Justin Mend, MD. Schedule an appointment as soon as possible for a visit in 1 week(s).   Specialty: Internal Medicine Contact information: [place] [address] [phone]         Day, [name], MD. Schedule an appointment as soon as possible for a visit in 1 week(s).   Specialty: Internal Medicine Contact information: [place] [address] [phone]              Contact information for after-discharge care     Destination     [facility] Preferred SNF .   Service: Skilled Nursing Contact information: [place] [phone]                     Major procedures and Radiology Reports - PLEASE review detailed and final reports thoroughly  -       IR US Guide Bx Asp/Drain  Result Date: 12/26/2021 INDICATION: 41 year old female referred for medical liver biopsy EXAM: IR ULTRASOUND GUIDANCE; ULTRASOUND BIOPSY CORE LIVER MEDICATIONS: None. ANESTHESIA/SEDATION: Moderate (conscious)  sedation was not employed during this procedure. A total of Versed 0.5 mg and Fentanyl 0 mcg was administered intravenously. Moderate Sedation Time: 0 minutes. The patient's level of consciousness and vital signs were monitored continuously by radiology nursing throughout the procedure under my direct supervision. FLUOROSCOPY TIME:  Ultrasound COMPLICATIONS: None PROCEDURE: Informed written consent was obtained from the patient's family after a thorough discussion of the procedural risks, benefits and alternatives. All questions were addressed. Maximal Sterile Barrier Technique was utilized including caps, mask, sterile gowns, sterile gloves, sterile drape, hand hygiene and skin antiseptic. A timeout was performed prior to the initiation of the procedure. Ultrasound survey of the left liver lobe performed with images stored and sent to PACs. No ascites was identified. The subxiphoid region was prepped with chlorhexidine in a sterile fashion, and a sterile drape was applied covering the operative field. A sterile gown and sterile gloves were used for the procedure. Local anesthesia was provided with 1% Lidocaine. The patient was prepped and draped sterilely and the skin and subcutaneous tissues were generously infiltrated with 1% lidocaine. A 17 gauge introducer needle was then advanced under ultrasound guidance in subxiphoid region into the left liver lobe. The stylet was removed, and multiple separate 18 gauge core biopsy were retrieved. Samples were placed into formalin for transportation to the lab. Gel-Foam pledgets were then infused with a small amount of saline for assistance with hemostasis.  The needle was removed, and a final ultrasound image was performed. The patient tolerated the procedure well and remained hemodynamically stable throughout. No complications were encountered and no significant blood loss was encounter. IMPRESSION: Status post ultrasound-guided medical liver biopsy of the left liver.  Signed, Dulcy Fanny. Nadene Rubins, RPVI [facility] Radiology Electronically Signed   By: Corrie Mckusick D.O.   On: 12/26/2021 14:01   DG CHEST PORT 1 VIEW  Result Date: 12/25/2021 CLINICAL DATA:  Hypoxia. EXAM: PORTABLE CHEST 1 VIEW COMPARISON:  12/20/2021 FINDINGS: 0604 hours. Low volume film. Diffuse interstitial and hazy parahilar opacity bilaterally is new in the interval. Probable atelectasis in the lung bases. No substantial pleural effusion. Cardiopericardial silhouette is at upper limits of normal for size. The visualized bony structures of the thorax are unremarkable. Telemetry leads overlie the chest. IMPRESSION: Interval development of diffuse interstitial and hazy parahilar opacity. Imaging features suggest edema. Electronically Signed   By: Misty Stanley M.D.   On: 12/25/2021 06:28   MR BRAIN WO CONTRAST  Result Date: 12/25/2021 CLINICAL DATA:  Initial evaluation for mental status change, unknown cause. EXAM: MRI HEAD WITHOUT CONTRAST TECHNIQUE: Multiplanar, multiecho pulse sequences of the brain and surrounding structures were obtained without intravenous contrast. COMPARISON:  Prior CT from 12/09/2021. FINDINGS: Brain: Examination technically limited as the patient was unable to tolerate the exam. Axial and coronal DWI sequences only were performed. Diffusion-weighted imaging demonstrates no evidence for acute or subacute infarct. No visible mass lesion, mass effect, or midline shift. No visible areas of chronic cortical infarction. Ventricles normal size without hydrocephalus. No extra-axial fluid collection. Vascular: Not well assessed on this limited exam. Skull and upper cervical spine: Not well assessed on this limited exam. Sinuses/Orbits: Not well assessed on this limited exam. Other: None. IMPRESSION: 1. Technically limited study due to the patient's inability to tolerate the exam. Axial and coronal DWI sequences only were performed. 2. No  acute or subacute intracranial infarct. No other definite intracranial abnormality on this limited exam. Electronically Signed   By: Jeannine Boga M.D.   On: 12/25/2021 01:54   VAS [place] UPPER EXTREMITY VENOUS DUPLEX  Result Date: 12/24/2021 UPPER VENOUS STUDY  Patient Name:  CHRISTNE PLATTS  Date of Exam:   12/24/2021 Medical Rec #: [id number]     Accession #:    [id number] Date of Birth: [birth year]      Patient Gender: F Patient Age:   66 years Exam Location:  [facility] Procedure:      VAS [place] UPPER EXTREMITY VENOUS DUPLEX Referring Phys: Oren Binet --------------------------------------------------------------------------------  Indications: Swelling Limitations: Patient unable to follow commands, restraints, bandaging. Comparison Study: No prior studies. Performing Technologist: Darlin Coco RDMS, RVT  Examination Guidelines: A complete evaluation includes B-mode imaging, spectral Doppler, color Doppler, and power Doppler as needed of all accessible portions of each vessel. Bilateral testing is considered an integral part of a complete examination. Limited examinations for reoccurring indications may be performed as noted.  Right Findings: +----------+------------+---------+-----------+----------+---------------------+ RIGHT     CompressiblePhasicitySpontaneousProperties       Summary        +----------+------------+---------+-----------+----------+---------------------+ IJV           Full       Yes       Yes                                    +----------+------------+---------+-----------+----------+---------------------+  Subclavian               Yes       Yes                                    +----------+------------+---------+-----------+----------+---------------------+ Axillary      Full       Yes       Yes                                    +----------+------------+---------+-----------+----------+---------------------+ Brachial      Full                                                         +----------+------------+---------+-----------+----------+---------------------+ Radial        Full                                                        +----------+------------+---------+-----------+----------+---------------------+ Ulnar                                                Unable to visualize                                                       due to positioning   +----------+------------+---------+-----------+----------+---------------------+ Cephalic      None       No        No                       Acute         +----------+------------+---------+-----------+----------+---------------------+ Basilic                                              Unable to visualize                                                       due to positioning   +----------+------------+---------+-----------+----------+---------------------+  Left Findings: +----------+------------+---------+-----------+----------+-------+ LEFT      CompressiblePhasicitySpontaneousPropertiesSummary +----------+------------+---------+-----------+----------+-------+ IJV                      Yes       Yes                      +----------+------------+---------+-----------+----------+-------+ Subclavian               Yes  Yes                      +----------+------------+---------+-----------+----------+-------+ Axillary      Full       Yes       Yes                      +----------+------------+---------+-----------+----------+-------+ Brachial      Full                                          +----------+------------+---------+-----------+----------+-------+ Radial        Full                                          +----------+------------+---------+-----------+----------+-------+ Ulnar         Full                                          +----------+------------+---------+-----------+----------+-------+  Cephalic      None       No        No                Acute  +----------+------------+---------+-----------+----------+-------+ Basilic       Full                                          +----------+------------+---------+-----------+----------+-------+  Summary:  Right: No evidence of deep vein thrombosis in the upper extremity. Findings consistent with acute superficial vein thrombosis involving the right cephalic vein.  Left: No evidence of deep vein thrombosis in the upper extremity. Findings consistent with acute superficial vein thrombosis involving the left cephalic vein. However, unable to visualize the basilic vein and ulnar veins.  *See table(s) above for measurements and observations.  Diagnosing physician: Monica Martinez MD Electronically signed by Monica Martinez MD on 12/24/2021 at 5:06:18 PM.    Final    EEG adult  Result Date: 12/24/2021 Lora Havens, MD     12/24/2021  3:34 PM Patient Name: APPOLLONIA KLEE MRN: [id number] Epilepsy Attending: Lora Havens Referring Physician/Provider: Jonetta Osgood, MD Date: 12/24/2021 Duration: 22.08 mins Patient history: 41 year old female with altered mental status.  EEG to evaluate for seizure. Level of alertness: Awake AEDs during EEG study: None Technical aspects: This EEG study was done with scalp electrodes positioned according to the 10-20 International system of electrode placement. Electrical activity was reviewed with band pass filter of 1-'70Hz'$ , sensitivity of 7 uV/mm, display speed of 13m/sec with a '60Hz'$  notched filter applied as appropriate. EEG data were recorded continuously and digitally stored.  Video monitoring was available and reviewed as appropriate. Description: No posterior dominant rhythm was seen. EEG showed continuous generalized 3 to 6 Hz theta-delta slowing. Hyperventilation and photic stimulation were not performed.   ABNORMALITY - Continuous slow, generalized IMPRESSION: This study is suggestive of moderate  diffuse encephalopathy, nonspecific etiology. No seizures or epileptiform discharges were seen throughout the recording. Priyanka OBarbra Sarks  IR Paracentesis  Result Date: 12/21/2021 INDICATION: Patient with history of cirrhosis, previous ultrasound  showed ascites. Request for therapeutic and diagnostic paracentesis up to 1 L. EXAM: ULTRASOUND GUIDED DIAGNOSTIC AND THERAPEUTIC PARACENTESIS MEDICATIONS: 10 mL 1% lidocaine COMPLICATIONS: None immediate. PROCEDURE: Informed written consent was obtained from the patient after a discussion of the risks, benefits and alternatives to treatment. A timeout was performed prior to the initiation of the procedure. Initial ultrasound scanning demonstrates a small amount of ascites within the right lower abdominal quadrant. The right lower abdomen was prepped and draped in the usual sterile fashion. 1% lidocaine was used for local anesthesia. Following this, a 19 gauge, 7-cm, Yueh catheter was introduced. An ultrasound image was saved for documentation purposes. The paracentesis was performed. The catheter was removed and a dressing was applied. The patient tolerated the procedure well without immediate post procedural complication. FINDINGS: A total of approximately 1 L of hazy yellow fluid was removed. Samples were sent to the laboratory as requested by the clinical team. IMPRESSION: Successful ultrasound-guided paracentesis yielding 1 L of peritoneal fluid. Read by: Durenda Guthrie, PA-C PLAN: If the patient eventually requires >/=2 paracenteses in a 30 day period, candidacy for formal evaluation by the [facility] will be assessed. Michaelle Birks, MD [facility] Radiology Electronically Signed   By: Michaelle Birks M.D.   On: 12/21/2021 15:39   US Abdomen Limited  Result Date: 12/21/2021 CLINICAL DATA:  Ascites EXAM: LIMITED ABDOMEN ULTRASOUND FOR ASCITES TECHNIQUE: Limited ultrasound  survey for ascites was performed in all four abdominal quadrants. COMPARISON:  CT abdomen and pelvis 12/21/2018. FINDINGS: Moderate volume ascites demonstrated within all 4 quadrants. IMPRESSION: Moderate volume abdominal ascites. Electronically Signed   By: Lovey Newcomer M.D.   On: 12/21/2021 11:59   CT Abdomen Pelvis W Contrast  Result Date: 12/20/2021 CLINICAL DATA:  Chest pain abdominal pain EXAM: CT ANGIOGRAPHY CHEST CT ABDOMEN AND PELVIS WITH CONTRAST TECHNIQUE: Multidetector CT imaging of the chest was performed using the standard protocol during bolus administration of intravenous contrast. Multiplanar CT image reconstructions and MIPs were obtained to evaluate the vascular anatomy. Multidetector CT imaging of the abdomen and pelvis was performed using the standard protocol during bolus administration of intravenous contrast. RADIATION DOSE REDUCTION: This exam was performed according to the departmental dose-optimization program which includes automated exposure control, adjustment of the mA and/or kV according to patient size and/or use of iterative reconstruction technique. CONTRAST:  17m OMNIPAQUE IOHEXOL 350 MG/ML SOLN COMPARISON:  12/06/2021, chest x-ray 12/20/2021, CT chest 04/11/2011 FINDINGS: CTA CHEST FINDINGS Cardiovascular: Satisfactory opacification of the pulmonary arteries to the segmental level. No evidence of pulmonary embolism. Normal heart size. No pericardial effusion. Nonaneurysmal aorta. No dissection is seen. Mediastinum/Nodes: No enlarged mediastinal, hilar, or axillary lymph nodes. Thyroid gland, trachea, and esophagus demonstrate no significant findings. Lungs/Pleura: Subsegmental atelectasis in the bases. No acute airspace disease, pleural effusion or pneumothorax Musculoskeletal: No chest wall abnormality. No acute or significant osseous findings. Review of the MIP images confirms the above findings. CT ABDOMEN and PELVIS FINDINGS Hepatobiliary: Liver cirrhosis. Slightly  distorted appearance of the central parenchyma. Prominent central liver enhancement. Contracted gallbladder without calcified stone. Pancreas: No inflammation. Suspect significant atrophy. Not much pancreatic tissue identified. Spleen: Normal in size without focal abnormality. Adrenals/Urinary Tract: Adrenal glands are unremarkable. Kidneys are normal, without renal calculi, focal lesion, or hydronephrosis. Bladder is unremarkable. Stomach/Bowel: Stomach is nonenlarged. No dilated small bowel. Multiple thickened small bowel loops most evident in the left upper quadrant involving the jejunum. There is diffuse colon wall  thickening. Vascular/Lymphatic: Nonaneurysmal aorta. Mild atherosclerosis. Subcentimeter retroperitoneal lymph nodes. Upper intra hepatic IVC appears slightly narrowed. Reproductive: IUD in the uterus.  No adnexal mass. Other: Moderate ascites within the abdomen and pelvis. No free air. Generalized edema consistent with anasarca. Musculoskeletal: No acute osseous abnormality. Review of the MIP images confirms the above findings. IMPRESSION: 1. No definite CT evidence for acute pulmonary embolus. Subsegmental atelectasis at the bases. 2. Liver cirrhosis with distorted appearance of the central parenchyma suggestive of fibrosis. When the patient is clinically stable and able to follow directions and hold their breath (preferably as an outpatient) further evaluation with dedicated abdominal MRI should be considered. 3. Prominent small bowel thickening and diffuse colon wall thickening. Differential considerations include enterocolitis versus nonspecific bowel edema secondary to edematous state or hypoproteinemia. 4. Moderate volume of abdominopelvic ascites. Generalized subcutaneous edema consistent with anasarca. Electronically Signed   By: Donavan Foil M.D.   On: 12/20/2021 19:46   CT Angio Chest PE W/Cm &/Or Wo Cm  Result Date: 12/20/2021 CLINICAL DATA:  Chest pain abdominal pain EXAM: CT  ANGIOGRAPHY CHEST CT ABDOMEN AND PELVIS WITH CONTRAST TECHNIQUE: Multidetector CT imaging of the chest was performed using the standard protocol during bolus administration of intravenous contrast. Multiplanar CT image reconstructions and MIPs were obtained to evaluate the vascular anatomy. Multidetector CT imaging of the abdomen and pelvis was performed using the standard protocol during bolus administration of intravenous contrast. RADIATION DOSE REDUCTION: This exam was performed according to the departmental dose-optimization program which includes automated exposure control, adjustment of the mA and/or kV according to patient size and/or use of iterative reconstruction technique. CONTRAST:  133m OMNIPAQUE IOHEXOL 350 MG/ML SOLN COMPARISON:  12/06/2021, chest x-ray 12/20/2021, CT chest 04/11/2011 FINDINGS: CTA CHEST FINDINGS Cardiovascular: Satisfactory opacification of the pulmonary arteries to the segmental level. No evidence of pulmonary embolism. Normal heart size. No pericardial effusion. Nonaneurysmal aorta. No dissection is seen. Mediastinum/Nodes: No enlarged mediastinal, hilar, or axillary lymph nodes. Thyroid gland, trachea, and esophagus demonstrate no significant findings. Lungs/Pleura: Subsegmental atelectasis in the bases. No acute airspace disease, pleural effusion or pneumothorax Musculoskeletal: No chest wall abnormality. No acute or significant osseous findings. Review of the MIP images confirms the above findings. CT ABDOMEN and PELVIS FINDINGS Hepatobiliary: Liver cirrhosis. Slightly distorted appearance of the central parenchyma. Prominent central liver enhancement. Contracted gallbladder without calcified stone. Pancreas: No inflammation. Suspect significant atrophy. Not much pancreatic tissue identified. Spleen: Normal in size without focal abnormality. Adrenals/Urinary Tract: Adrenal glands are unremarkable. Kidneys are normal, without renal calculi, focal lesion, or hydronephrosis.  Bladder is unremarkable. Stomach/Bowel: Stomach is nonenlarged. No dilated small bowel. Multiple thickened small bowel loops most evident in the left upper quadrant involving the jejunum. There is diffuse colon wall thickening. Vascular/Lymphatic: Nonaneurysmal aorta. Mild atherosclerosis. Subcentimeter retroperitoneal lymph nodes. Upper intra hepatic IVC appears slightly narrowed. Reproductive: IUD in the uterus.  No adnexal mass. Other: Moderate ascites within the abdomen and pelvis. No free air. Generalized edema consistent with anasarca. Musculoskeletal: No acute osseous abnormality. Review of the MIP images confirms the above findings. IMPRESSION: 1. No definite CT evidence for acute pulmonary embolus. Subsegmental atelectasis at the bases. 2. Liver cirrhosis with distorted appearance of the central parenchyma suggestive of fibrosis. When the patient is clinically stable and able to follow directions and hold their breath (preferably as an outpatient) further evaluation with dedicated abdominal MRI should be considered. 3. Prominent small bowel thickening and diffuse colon wall thickening. Differential considerations include enterocolitis versus nonspecific bowel  edema secondary to edematous state or hypoproteinemia. 4. Moderate volume of abdominopelvic ascites. Generalized subcutaneous edema consistent with anasarca. Electronically Signed   By: Donavan Foil M.D.   On: 12/20/2021 19:46   DG Chest Port 1 View  Result Date: 12/20/2021 CLINICAL DATA:  Shortness of breath. EXAM: PORTABLE CHEST 1 VIEW COMPARISON:  Chest x-ray 12/04/2021 FINDINGS: The heart size and mediastinal contours are within normal limits. Both lungs are clear. The visualized skeletal structures are unremarkable. IMPRESSION: No active disease. Electronically Signed   By: Ronney Asters M.D.   On: 12/20/2021 17:21   CT HEAD WO CONTRAST (5MM)  Result Date: 12/17/2021 CLINICAL DATA:  Delirium. Seated fall last night without head injury.  EXAM: CT HEAD WITHOUT CONTRAST TECHNIQUE: Contiguous axial images were obtained from the base of the skull through the vertex without intravenous contrast. RADIATION DOSE REDUCTION: This exam was performed according to the departmental dose-optimization program which includes automated exposure control, adjustment of the mA and/or kV according to patient size and/or use of iterative reconstruction technique. COMPARISON:  None Available. FINDINGS: Brain: No evidence of acute infarction, hemorrhage, hydrocephalus, extra-axial collection or mass lesion/mass effect. Vascular: No hyperdense vessel or unexpected calcification. Skull: Normal. Negative for fracture or focal lesion. Sinuses/Orbits: No acute finding. IMPRESSION: No acute finding.  No evidence of injury. Electronically Signed   By: Jorje Guild M.D.   On: 12/17/2021 12:19   ECHOCARDIOGRAM COMPLETE  Result Date: 12/15/2021    ECHOCARDIOGRAM REPORT   Patient Name:   YOCHEVED DEPNER Date of Exam: 12/15/2021 Medical Rec #:  [id number]    Height:       64.0 in Accession #:    [id number]   Weight:       196.4 lb Date of Birth:  04-24-1980     BSA:          1.941 m Patient Age:    83 years     BP:           106/78 mmHg Patient Gender: F            HR:           102 bpm. Exam Location:  Inpatient Procedure: 2D Echo, Cardiac Doppler and Color Doppler Indications:    CHF-Acute Diastolic Y70.62  History:        Patient has prior history of Echocardiogram examinations, most                 recent 05/22/2021. Risk Factors:Diabetes and Hypertension. Severe                 volume overload/bilateral lower extremity edema. Liver cirrhosis                 with hepatic steatosis. Chronic macrocytic anemia.  Sonographer:    Darlina Sicilian RDCS Referring Phys: [id number] AMRIT ADHIKARI  Sonographer Comments: Suboptimal apical window due to limited patient mobility. IMPRESSIONS  1. Left ventricular ejection fraction, by estimation, is 60 to 65%. The left ventricle has normal function.  The left ventricle has no regional wall motion abnormalities. Left ventricular diastolic parameters are indeterminate.  2. Right ventricular systolic function is normal. The right ventricular size is normal. Tricuspid regurgitation signal is inadequate for assessing PA pressure.  3. No evidence of mitral valve regurgitation.  4. The aortic valve was not well visualized. Aortic valve regurgitation is not visualized.  5. IVC is not well visualzied. Comparison(s): No significant change from prior study. FINDINGS  Left Ventricle: Left  ventricular ejection fraction, by estimation, is 60 to 65%. The left ventricle has normal function. The left ventricle has no regional wall motion abnormalities. The left ventricular internal cavity size was normal in size. There is  no left ventricular hypertrophy. Left ventricular diastolic parameters are indeterminate. Right Ventricle: The right ventricular size is normal. Right ventricular systolic function is normal. Tricuspid regurgitation signal is inadequate for assessing PA pressure. Left Atrium: Left atrial size was normal in size. Right Atrium: Right atrial size was normal in size. Pericardium: There is no evidence of pericardial effusion. Mitral Valve: No evidence of mitral valve regurgitation. Tricuspid Valve: The tricuspid valve is normal in structure. Tricuspid valve regurgitation is not demonstrated. Aortic Valve: The aortic valve was not well visualized. Aortic valve regurgitation is not visualized. Pulmonic Valve: Pulmonic valve regurgitation is not visualized. Aorta: The aortic root and ascending aorta are structurally normal, with no evidence of dilitation. Venous: IVC is not well visualzied.  LEFT VENTRICLE PLAX 2D LVIDd:         4.60 cm   Diastology LVIDs:         3.20 cm   LV e' medial:    7.18 cm/s LV PW:         0.80 cm   LV E/e' medial:  7.6 LV IVS:        0.85 cm   LV e' lateral:   7.29 cm/s LVOT diam:     1.80 cm   LV E/e' lateral: 7.5 LV SV:         33 LV SV  Index:   17 LVOT Area:     2.54 cm  LEFT ATRIUM             Index        RIGHT ATRIUM          Index LA diam:        3.00 cm 1.55 cm/m   RA Area:     6.47 cm LA Vol (A2C):   31.3 ml 16.12 ml/m  RA Volume:   8.99 ml  4.63 ml/m LA Vol (A4C):   33.6 ml 17.31 ml/m LA Biplane Vol: 33.2 ml 17.10 ml/m  AORTIC VALVE LVOT Vmax:   80.90 cm/s LVOT Vmean:  59.500 cm/s LVOT VTI:    0.130 m  AORTA Ao Root diam: 2.70 cm Ao Asc diam:  2.50 cm MITRAL VALVE MV Area (PHT): 3.46 cm    SHUNTS MV Decel Time: 219 msec    Systemic VTI:  0.13 m MV E velocity: 54.80 cm/s  Systemic Diam: 1.80 cm MV A velocity: 52.70 cm/s MV E/A ratio:  1.04 Mary Scientist, physiological signed by Phineas Inches Signature Date/Time: 12/15/2021/1:46:34 PM    Final    VAS [place] LOWER EXTREMITY VENOUS (DVT)  Result Date: 12/13/2021  Lower Venous DVT Study Patient Name:  ABRAHAM MARGULIES  Date of Exam:   12/13/2021 Medical Rec #: [id number]     Accession #:    [id number] Date of Birth: 06-18-1980      Patient Gender: F Patient Age:   25 years Exam Location:  [facility] Procedure:      VAS [place] LOWER EXTREMITY VENOUS (DVT) Referring Phys: AMRIT ADHIKARI --------------------------------------------------------------------------------  Indications: Edema.  Limitations: Body habitus, poor ultrasound/tissue interface and subcutaneous edema. Comparison Study: No previous exams Performing Technologist: Jody Hill RVT, RDMS  Examination Guidelines: A complete evaluation includes B-mode imaging, spectral Doppler, color Doppler, and power Doppler as needed of all accessible portions  of each vessel. Bilateral testing is considered an integral part of a complete examination. Limited examinations for reoccurring indications may be performed as noted. The reflux portion of the exam is performed with the patient in reverse Trendelenburg.  +--------+---------------+---------+-----------+----------+--------------------+ RIGHT    CompressibilityPhasicitySpontaneityPropertiesThrombus Aging       +--------+---------------+---------+-----------+----------+--------------------+ CFV     Full           Yes      Yes                                       +--------+---------------+---------+-----------+----------+--------------------+ SFJ     Full                                                              +--------+---------------+---------+-----------+----------+--------------------+ FV Prox Full           Yes      Yes                                       +--------+---------------+---------+-----------+----------+--------------------+ FV Mid  Full           Yes      Yes                                       +--------+---------------+---------+-----------+----------+--------------------+ FV      Full           Yes      Yes                                       Distal                                                                    +--------+---------------+---------+-----------+----------+--------------------+ PFV                    Yes      Yes                  patent by                                                                 color/doppler        +--------+---------------+---------+-----------+----------+--------------------+ POP     Full           Yes      Yes                                       +--------+---------------+---------+-----------+----------+--------------------+  PTV     Full                                         Not well visualized  +--------+---------------+---------+-----------+----------+--------------------+ PERO                                                 not visualized       +--------+---------------+---------+-----------+----------+--------------------+   Right Technical Findings: Not visualized segments include PeroV.  +--------+---------------+---------+-----------+----------+--------------------+ LEFT     CompressibilityPhasicitySpontaneityPropertiesThrombus Aging       +--------+---------------+---------+-----------+----------+--------------------+ CFV     Full           Yes      Yes                                       +--------+---------------+---------+-----------+----------+--------------------+ SFJ     Full                                                              +--------+---------------+---------+-----------+----------+--------------------+ FV Prox Full           Yes      Yes                                       +--------+---------------+---------+-----------+----------+--------------------+ FV Mid  Full           Yes      Yes                                       +--------+---------------+---------+-----------+----------+--------------------+ FV                                                   Not visualized       Distal                                                                    +--------+---------------+---------+-----------+----------+--------------------+ PFV                    Yes      Yes                  patent by  color/doppler        +--------+---------------+---------+-----------+----------+--------------------+ POP     Full           Yes      Yes                                       +--------+---------------+---------+-----------+----------+--------------------+ PTV                                                  patent by color      +--------+---------------+---------+-----------+----------+--------------------+ PERO    Full                                                              +--------+---------------+---------+-----------+----------+--------------------+   Left Technical Findings: Not visualized segments include distal FV, PTV.   Summary: BILATERAL: -No evidence of popliteal cyst, bilaterally. - Diffuse subcutaneous edema,  bilaterally. RIGHT: - There is no evidence of deep vein thrombosis in the lower extremity. However, portions of this examination were limited- see technologist comments above.  LEFT: - There is no evidence of deep vein thrombosis in the lower extremity. However, portions of this examination were limited- see technologist comments above.  *See table(s) above for measurements and observations. Electronically signed by Monica Martinez MD on 12/13/2021 at 2:05:22 PM.    Final    US Abdomen Limited RUQ (LIVER/GB)  Result Date: 12/12/2021 CLINICAL DATA:  41 year old female with history of elevated liver enzymes. EXAM: ULTRASOUND ABDOMEN LIMITED RIGHT UPPER QUADRANT COMPARISON:  Abdominal ultrasound 11/18/2021. FINDINGS: Gallbladder: No gallstones or wall thickening visualized. No sonographic Murphy sign noted by sonographer. Common bile duct: Diameter: 6.5 mm Liver: No focal lesion identified. Hepatic parenchyma is diffusely but heterogeneously echogenic, and liver has a nodular contour, indicative of underlying cirrhosis, likely with a background of fatty infiltration. Portal vein is patent on color Doppler imaging with normal direction of blood flow towards the liver. Other: None. IMPRESSION: 1. Borderline dilated common bile duct, similar to the prior examination. No cholelithiasis or definite choledocholithiasis. Additionally, there is no evidence of intrahepatic biliary ductal dilatation. If there is clinical concern for choledocholithiasis or other cause of biliary tract obstruction, further evaluation with abdominal MRI with and without IV gadolinium with MRCP could be considered. 2. Morphologic changes in the liver indicative of cirrhosis, likely with a background of hepatic steatosis. Electronically Signed   By: Vinnie Langton M.D.   On: 12/12/2021 05:49   DG Abd Portable 1V  Result Date: 12/06/2021 CLINICAL DATA:  Abdominal pain. EXAM: PORTABLE ABDOMEN - 1 VIEW COMPARISON:  12/04/2021 FINDINGS: No  evidence of bowel obstruction or significant ileus. No abnormal calcifications. Stable appearance of IUD. Visualized bony structures are unremarkable. IMPRESSION: No acute findings. Electronically Signed   By: Aletta Edouard M.D.   On: 12/06/2021 15:45    Micro Results    Recent Results (from the past 240 hour(s))  Urine Culture     Status: None   Collection Time: 12/25/21  8:51 AM   Specimen: Urine, Catheterized  Result Value Ref Range Status   Specimen Description  URINE, CATHETERIZED  Final   Special Requests NONE  Final   Culture   Final    NO GROWTH Performed at [facility], [place] [address] [place], [place] [address]    Report Status 12/26/2021 FINAL  Final    Today   Subjective    Anna Garcia today has no headache,no chest abdominal pain,no new weakness tingling or numbness, feels much better wants to go home today.    Objective   Blood pressure 101/75, pulse 92, temperature 98.6 F (37 C), temperature source Oral, resp. rate 15, height '5\' 4"'$  (1.626 m), weight 75.4 kg, SpO2 99 %.   Intake/Output Summary (Last 24 hours) at 01/04/2022 0820 Last data filed at 01/03/2022 2145 Gross per 24 hour  Intake 600 ml  Output --  Net 600 ml    Exam  Awake Alert, No new F.N deficits,    [email or web address].AT,PERRAL Supple Neck,   Symmetrical Chest wall movement, Good air movement bilaterally, CTAB RRR,No Gallops,   +ve B.Sounds, Abd Soft, Non tender,  1+ bilateral leg edema but much improved from before   Data Review   Recent Labs  Lab 12/31/21 0346 12/31/21 1140 01/01/22 0440 01/02/22 0428 01/03/22 0302 01/04/22 0248  WBC 6.4  --  7.3 7.7 6.4 6.1  HGB 7.0* 8.9* 9.1* 9.1* 9.1* 9.0*  HCT 21.2* 27.0* 26.8* 27.0* 27.7* 26.6*  PLT 104*  --  98* 108* 106* 108*  MCV 102.4*  --  101.1* 101.9* 103.7* 101.9*  MCH 33.8  --  34.3* 34.3* 34.1* 34.5*  MCHC 33.0  --  34.0 33.7 32.9 33.8  RDW 22.9*  --  23.2* 23.3* 22.9* 22.6*  LYMPHSABS 2.0  --  2.0 1.9 1.5 1.6  MONOABS 0.6  --  0.8  0.9 0.3 0.8  EOSABS 0.1  --  0.1 0.1 0.1 0.1  BASOSABS 0.1  --  0.1 0.1 0.2* 0.1    Recent Labs  Lab 12/31/21 0346 01/01/22 0440 01/02/22 0428 01/02/22 0947 01/03/22 0302 01/04/22 0248  NA 136 139 134*  --  137 137  K 3.2* 3.5 4.0  --  3.9 3.6  CL 105 108 104  --  106 105  CO2 21* 22 21*  --  23 22  GLUCOSE 306* 245* 381*  --  320* 279*  BUN <5* <5* 8  --  7 10  CREATININE 1.34* 1.17* 1.27*  --  0.96 1.20*  CALCIUM 10.0 10.0 9.5  --  9.4 9.5  AST 46* 50* 51*  --  59* 54*  ALT '17 17 17  '$ --  20 19  ALKPHOS 137* 129* 141*  --  134* 124  BILITOT 1.5* 2.1* 2.2*  --  1.9* 1.2  ALBUMIN 4.7 4.3 3.9  --  3.9 3.8  MG 1.9 1.7 2.2  --  2.0 1.9  AMMONIA  --   --   --  41* 28  --   BNP 802.2* 466.8* 405.7*  --  442.1* 405.2*    Total Time in preparing paper work, data evaluation and todays exam - 4 minutes  Lala Lund M.D on 01/04/2022 at 8:20 AM  [facility]

## 2022-01-05 LAB — MISC LABCORP TEST (SEND OUT): Labcorp test code: 791584

## 2022-01-11 ENCOUNTER — Encounter (HOSPITAL_COMMUNITY): Payer: Self-pay

## 2022-03-21 ENCOUNTER — Encounter (HOSPITAL_COMMUNITY): Payer: Self-pay

## 2022-03-21 ENCOUNTER — Observation Stay (HOSPITAL_COMMUNITY)
Admission: EM | Admit: 2022-03-21 | Discharge: 2022-03-22 | Disposition: A | Discharge status: Home / Self Care | Payer: Medicare Other | Attending: Internal Medicine | Admitting: Internal Medicine

## 2022-03-21 DIAGNOSIS — Z79899 Other long term (current) drug therapy: Secondary | ICD-10-CM | POA: Diagnosis not present

## 2022-03-21 DIAGNOSIS — J4540 Moderate persistent asthma, uncomplicated: Secondary | ICD-10-CM | POA: Diagnosis not present

## 2022-03-21 DIAGNOSIS — Z794 Long term (current) use of insulin: Secondary | ICD-10-CM | POA: Diagnosis not present

## 2022-03-21 DIAGNOSIS — E1069 Type 1 diabetes mellitus with other specified complication: Secondary | ICD-10-CM

## 2022-03-21 DIAGNOSIS — R112 Nausea with vomiting, unspecified: Secondary | ICD-10-CM | POA: Diagnosis not present

## 2022-03-21 DIAGNOSIS — E860 Dehydration: Secondary | ICD-10-CM | POA: Diagnosis not present

## 2022-03-21 DIAGNOSIS — E1065 Type 1 diabetes mellitus with hyperglycemia: Principal | ICD-10-CM

## 2022-03-21 DIAGNOSIS — I10 Essential (primary) hypertension: Secondary | ICD-10-CM | POA: Insufficient documentation

## 2022-03-21 DIAGNOSIS — Z87891 Personal history of nicotine dependence: Secondary | ICD-10-CM | POA: Diagnosis not present

## 2022-03-21 LAB — I-STAT VENOUS BLOOD GAS, ED
Acid-base deficit: 9 mmol/L — ABNORMAL HIGH (ref 0.0–2.0)
Bicarbonate: 13.6 mmol/L — ABNORMAL LOW (ref 20.0–28.0)
Calcium, Ion: 1.13 mmol/L — ABNORMAL LOW (ref 1.15–1.40)
HCT: 43 % (ref 36.0–46.0)
Hemoglobin: 14.6 g/dL (ref 12.0–15.0)
O2 Saturation: 57 %
Potassium: 4 mmol/L (ref 3.5–5.1)
Sodium: 134 mmol/L — ABNORMAL LOW (ref 135–145)
TCO2: 14 mmol/L — ABNORMAL LOW (ref 22–32)
pCO2, Ven: 22.5 mmHg — ABNORMAL LOW (ref 44–60)
pH, Ven: 7.39 (ref 7.25–7.43)
pO2, Ven: 29 mmHg — CL (ref 32–45)

## 2022-03-21 LAB — BASIC METABOLIC PANEL
Anion gap: 17 — ABNORMAL HIGH (ref 5–15)
BUN: 6 mg/dL (ref 6–20)
CO2: 15 mmol/L — ABNORMAL LOW (ref 22–32)
Calcium: 9.5 mg/dL (ref 8.9–10.3)
Chloride: 101 mmol/L (ref 98–111)
Creatinine, Ser: 1.41 mg/dL — ABNORMAL HIGH (ref 0.44–1.00)
GFR, Estimated: 48 mL/min — ABNORMAL LOW (ref >60)
Glucose, Bld: 298 mg/dL — ABNORMAL HIGH (ref 70–99)
Potassium: 3.7 mmol/L (ref 3.5–5.1)
Sodium: 133 mmol/L — ABNORMAL LOW (ref 135–145)

## 2022-03-21 LAB — MAGNESIUM: Magnesium: 1.5 mg/dL — ABNORMAL LOW (ref 1.7–2.4)

## 2022-03-21 LAB — CBG MONITORING, ED
Glucose-Capillary: 283 mg/dL — ABNORMAL HIGH (ref 70–99)
Glucose-Capillary: 286 mg/dL — ABNORMAL HIGH (ref 70–99)

## 2022-03-21 LAB — I-STAT BETA HCG BLOOD, ED (MC, WL, AP ONLY): I-stat hCG, quantitative: <5 m[IU]/mL (ref <5)

## 2022-03-21 LAB — CBC
HCT: 44.7 % (ref 36.0–46.0)
Hemoglobin: 14.7 g/dL (ref 12.0–15.0)
MCH: 33.3 pg (ref 26.0–34.0)
MCHC: 32.9 g/dL (ref 30.0–36.0)
MCV: 101.1 fL — ABNORMAL HIGH (ref 80.0–100.0)
Platelets: 154 10*3/uL (ref 150–400)
RBC: 4.42 MIL/uL (ref 3.87–5.11)
RDW: 15.4 % (ref 11.5–15.5)
WBC: 6.2 10*3/uL (ref 4.0–10.5)
nRBC: 0 % (ref 0.0–0.2)

## 2022-03-21 LAB — HEPATIC FUNCTION PANEL
ALT: 23 U/L (ref 0–44)
AST: 40 U/L (ref 15–41)
Albumin: 3.3 g/dL — ABNORMAL LOW (ref 3.5–5.0)
Alkaline Phosphatase: 75 U/L (ref 38–126)
Bilirubin, Direct: 0.4 mg/dL — ABNORMAL HIGH (ref 0.0–0.2)
Indirect Bilirubin: 1.7 mg/dL — ABNORMAL HIGH (ref 0.3–0.9)
Total Bilirubin: 2.1 mg/dL — ABNORMAL HIGH (ref 0.3–1.2)
Total Protein: 6.7 g/dL (ref 6.5–8.1)

## 2022-03-21 LAB — LIPASE, BLOOD: Lipase: 28 U/L (ref 11–51)

## 2022-03-21 MED ORDER — DEXTROSE IN LACTATED RINGERS 5 % IV SOLN: INTRAVENOUS | Status: DC

## 2022-03-21 MED ORDER — ENOXAPARIN SODIUM 40 MG/0.4ML IJ SOSY: 40.0000 mg | PREFILLED_SYRINGE | Freq: Every day | INTRAMUSCULAR | Status: DC

## 2022-03-21 MED ORDER — INSULIN REGULAR(HUMAN) IN NACL 100-0.9 UT/100ML-% IV SOLN
INTRAVENOUS | Status: DC
  Administered 2022-03-21: 6 [IU]/h via INTRAVENOUS
  Administered 2022-03-22: 3 [IU]/h via INTRAVENOUS
  Filled 2022-03-21: qty 100

## 2022-03-21 MED ORDER — PANTOPRAZOLE SODIUM 40 MG IV SOLR
40.0000 mg | Freq: Once | INTRAVENOUS | Status: AC
  Administered 2022-03-21: 40 mg via INTRAVENOUS
  Filled 2022-03-21: qty 10

## 2022-03-21 MED ORDER — DEXTROSE 50 % IV SOLN: 0.0000 mL | INTRAVENOUS | Status: DC | PRN

## 2022-03-21 MED ORDER — LACTATED RINGERS IV BOLUS
1000.0000 mL | INTRAVENOUS | Status: AC
  Administered 2022-03-21: 1000 mL via INTRAVENOUS

## 2022-03-21 MED ORDER — LACTATED RINGERS IV SOLN: INTRAVENOUS | Status: DC

## 2022-03-21 MED ORDER — ONDANSETRON HCL 4 MG/2ML IJ SOLN
4.0000 mg | Freq: Once | INTRAMUSCULAR | Status: AC
  Administered 2022-03-21: 4 mg via INTRAVENOUS
  Filled 2022-03-21: qty 2

## 2022-03-21 NOTE — H&P (Addendum)
History and Physical  Anna Garcia IBB:[id number] DOB: Jun 26, 1980 DOA: 03/21/2022  Referring physician: Dr. Melina Copa, [place].  PCP: Day, [name], MD  Outpatient Specialists: GI. Patient coming from: Home  Chief Complaint: Nausea vomiting  HPI: Anna Garcia is a 41 y.o. female with medical history significant for type 1 diabetes, history of DKA type I, diabetic polyneuropathy, gastroparesis, hypertension, hyperlipidemia, neurogenic bladder, chronic pain syndrome, who presented to [facility] ED with complaints of several days of nausea and vomiting, unable to tolerate oral intake.  Denies subjective fevers or chills.  Denies abdominal pain.  No diarrhea.  In the ED, noted to be hyperglycemic with electrolytes derangement.  She was started on IV insulin, IV fluids, and IV electrolytes replacement.  TRH, hospitalist service was asked to admit.  ED Course: Tmax 98.5.  BP 99/58, pulse 95, respiration rate 15, O2 saturation 100% on room air.  Lab studies remarkable for serum sodium 134, serum bicarb 15, glucose 298, creatinine 1.41, magnesium 1.5, total bilirubin 2.1.  GFR 48.  Review of Systems: Review of systems as noted in the HPI. All other systems reviewed and are negative.   Past Medical History:  Diagnosis Date   Diabetes mellitus    Gastroparesis    Hypertension    Neuropathy    Past Surgical History:  Procedure Laterality Date   ESOPHAGOGASTRODUODENOSCOPY (EGD) WITH PROPOFOL N/A 09/24/2021   Procedure: ESOPHAGOGASTRODUODENOSCOPY (EGD) WITH PROPOFOL;  Surgeon: Clarene Essex, MD;  Location: [facility] ENDOSCOPY;  Service: Gastroenterology;  Laterality: N/A;   IR PARACENTESIS  12/21/2021   IR US GUIDE BX ASP/DRAIN  12/26/2021   SAVORY DILATION N/A 09/24/2021   Procedure: SAVORY DILATION - 16 MM;  Surgeon: Clarene Essex, MD;  Location: [facility]OSCOPY;  Service: Gastroenterology;  Laterality: N/A;    Social History:  reports that she has quit smoking. Her smoking use included cigarettes. She smoked an average of .5  packs per day. She has never used smokeless tobacco. She reports that she does not drink alcohol and does not use drugs.   No Known Allergies  Family History  Problem Relation Age of Onset   Hypertension Mother    Dementia Mother    Heart attack Father    Hypertension Father    Heart disease Father    Hypertension Brother    Stroke Maternal Grandfather       Prior to Admission medications   Medication Sig Start Date End Date Taking? Authorizing Provider  BAQSIMI TWO PACK 3 MG/DOSE POWD Place 1 spray into both nostrils as needed (for a very low blood sugar emergency, when unable to eat or drink and need help from someone else- or as otherwise directed). 10/30/21   [provider]  dicyclomine (BENTYL) 10 MG capsule Take 1 capsule (10 mg total) by mouth 4 (four) times daily -  before meals and at bedtime. 12/11/21   Samuella Cota, MD  ferrous sulfate 325 (65 FE) MG tablet Take 1 tablet by mouth daily with breakfast. 12/19/21   Shelly Coss, MD  folic acid (FOLVITE) 1 MG tablet Take 1 tablet (1 mg total) by mouth daily. 01/05/22   Thurnell Lose, MD  furosemide (LASIX) 40 MG tablet Take 1 tablet (40 mg total) by mouth daily. 01/04/22   Thurnell Lose, MD  gabapentin (NEURONTIN) 300 MG capsule Take 1 capsule (300 mg total) by mouth 3 (three) times daily. 05/28/21   Eugenie Filler, MD  insulin lispro (HUMALOG KWIKPEN) 100 UNIT/ML KwikPen Before each meal  3 times a day, 140-199 - 2 units, 200-250 - 4 units, 251-299 - 6 units,  300-349 - 8 units,  350 or above 10 units. 01/04/22   Thurnell Lose, MD  Insulin Pen Needle 32G X 4 MM MISC Use as directed up to 4 times daily. 01/04/22   Thurnell Lose, MD  lipase/protease/amylase (CREON) 36000 UNITS CPEP capsule Take 1 capsule (36,000 Units total) by mouth 3 (three) times daily with meals. 12/11/21   Samuella Cota, MD  loperamide (IMODIUM) 2 MG capsule Take 1 capsule (2 mg total) by mouth as needed for diarrhea or loose  stools. Patient taking differently: Take 2 mg by mouth daily as needed for diarrhea or loose stools. 09/26/21   Regalado, Belkys A, MD  metoCLOPramide (REGLAN) 10 MG tablet Take 1 tablet by mouth every 6 hours as needed for nausea. 12/18/21 01/17/22  Shelly Coss, MD  midodrine (PROAMATINE) 10 MG tablet Take 1 tablet (10 mg total) by mouth 3 (three) times daily with meals. 01/04/22   Thurnell Lose, MD  mometasone-formoterol (DULERA) 100-5 MCG/ACT AERO Inhale 2 puffs into the lungs 2 (two) times daily. 07/07/20   Mercy Riding, MD  montelukast (SINGULAIR) 10 MG tablet Take 10 mg by mouth at bedtime. 05/24/19   [provider]  MOTEGRITY 1 MG TABS Take 1 mg by mouth daily. 08/31/21   [provider]  pantoprazole (PROTONIX) 40 MG tablet Take 1 tablet (40 mg total) by mouth daily. 01/05/22   Thurnell Lose, MD  QUEtiapine (SEROQUEL) 25 MG tablet Take 1 tablet (25 mg total) by mouth at bedtime. 01/04/22   Thurnell Lose, MD  rosuvastatin (CRESTOR) 20 MG tablet Take 20 mg by mouth at bedtime. 05/24/19   [provider]  spironolactone (ALDACTONE) 25 MG tablet Take 1 tablet (25 mg total) by mouth daily. 01/04/22   Thurnell Lose, MD  sulfamethoxazole-trimethoprim (BACTRIM DS) 800-160 MG tablet Take 1 tablet by mouth daily. 01/04/22   Thurnell Lose, MD  tamsulosin (FLOMAX) 0.4 MG CAPS capsule Take 1 capsule (0.4 mg total) by mouth daily. 01/04/22   Thurnell Lose, MD  insulin aspart (NOVOLOG) 100 UNIT/ML injection Inject 3 units three times a day with meals if you eat over 50% of your meals. In addition, use sliding scale as follows: CBG 70 - 120: 0 units CBG 121 - 150: 2 units CBG 151 - 200: 3 units CBG 201 - 250: 5 units CBG 251 - 300: 8 units CBG 301 - 350: 11 units CBG 351 - 400: 15 units CBG > 400: call MD and obtain STAT lab verification Patient not taking: No sig reported 07/07/20 10/13/20  Mercy Riding, MD    Physical Exam: BP 104/72 (BP Location: Right Arm)    Pulse 94   Temp 98.4 F (36.9 C)   Resp 13   Ht '5\' 4"'$  (1.626 m)   Wt 75 kg   SpO2 100%   BMI 28.38 kg/m   General: 41 y.o. year-old female well developed well nourished in no acute distress.  Alert and oriented x3. Cardiovascular: Regular rate and rhythm with no rubs or gallops.  No thyromegaly or JVD noted.  No lower extremity edema. 2/4 pulses in all 4 extremities. Respiratory: Clear to auscultation with no wheezes or rales. Good inspiratory effort. Abdomen: Soft nontender nondistended with normal bowel sounds x4 quadrants. Muskuloskeletal: No cyanosis, clubbing or edema noted bilaterally Neuro: CN II-XII intact, strength, sensation, reflexes Skin: No  ulcerative lesions noted or rashes Psychiatry: Judgement and insight appear normal. Mood is appropriate for condition and setting          Labs on Admission:  Basic Metabolic Panel: Recent Labs  Lab 03/21/22 2119 03/21/22 2136  NA 133* 134*  K 3.7 4.0  CL 101  --   CO2 15*  --   GLUCOSE 298*  --   BUN 6  --   CREATININE 1.41*  --   CALCIUM 9.5  --    Liver Function Tests: Recent Labs  Lab 03/21/22 2119  AST 40  ALT 23  ALKPHOS 75  BILITOT 2.1*  PROT 6.7  ALBUMIN 3.3*   Recent Labs  Lab 03/21/22 2119  LIPASE 28   No results for input(s): "AMMONIA" in the last 168 hours. CBC: Recent Labs  Lab 03/21/22 2119 03/21/22 2136  WBC 6.2  --   HGB 14.7 14.6  HCT 44.7 43.0  MCV 101.1*  --   PLT 154  --    Cardiac Enzymes: No results for input(s): "CKTOTAL", "CKMB", "CKMBINDEX", "TROPONINI" in the last 168 hours.  BNP (last 3 results) Recent Labs    01/02/22 0428 01/03/22 0302 01/04/22 0248  BNP 405.7* 442.1* 405.2*    ProBNP (last 3 results) No results for input(s): "PROBNP" in the last 8760 hours.  CBG: Recent Labs  Lab 03/21/22 2057  GLUCAP 283*    Radiological Exams on Admission: No results found.  EKG: I independently viewed the EKG done and my findings are as followed: None available at  the time of this visit.  Assessment/Plan Present on Admission:  Intractable nausea and vomiting  Principal Problem:   Intractable nausea and vomiting  Intractable nausea and vomiting, unclear etiology. History of gastroparesis, resume home regimen IV antiemetics as needed Monitor electrolytes and metabolic acidosis IV fluid hydration Replete electrolytes as indicated.  Type 1 diabetes with hyperglycemia Resume home regimen. Last A1c 7.6 on 11/18/2021.  High anion gap metabolic acidosis Serum bicarb 15, anion gap 17 Started on Endo tool and IV fluid in the ED BMP every 4 DC Endo tool once acidosis has improved.  Diabetic polyneuropathy Resume home regimen.  Chronic hypotension Resume home midodrine    DVT prophylaxis: Subcu Lovenox daily  Code Status: Full code  Family Communication: None at bedside  Disposition Plan: Admitted to progressive care unit  Consults called: None.  Admission status: Inpatient status.   Status is: Inpatient The patient requires at least 2 midnights for further evaluation and treatment of present condition.   Kayleen Memos MD [facility] Pager [phone]  If 7PM-7AM, please contact night-coverage [email or web address] Password [email or web address]  03/21/2022, 11:13 PM

## 2022-03-21 NOTE — ED Triage Notes (Signed)
Pt comes from home via [facility], has been having n/v for the past week, pt is diabetic with hx of DKA and cirrhosis.  PTA received 500 ml of fluid and '4mg'$  of zofran

## 2022-03-21 NOTE — ED Provider Triage Note (Signed)
Emergency Medicine Provider Triage Evaluation Note  Anna Garcia , a 41 y.o. female  was evaluated in triage.  Pt complains of NV.  Hx of DKA w sim sx.   Insulin last administered today 1 hour ago (2U), she states her BG have been on the lower side (around 300). She is a brittle diabetic.   Review of Systems  Positive: NV, fatigue Negative: Fever   Physical Exam  BP 103/79   Pulse (!) 105   Temp 98.5 F (36.9 C) (Oral)   Resp 18   SpO2 100%  Gen:   Awake, no distress   Resp:  Normal effort  MSK:   Moves extremities without difficulty  Other:  Abd soft NTTP  Medical Decision Making  Medically screening exam initiated at 9:06 PM.  Appropriate orders placed.  Anna Garcia was informed that the remainder of the evaluation will be completed by another provider, this initial triage assessment does not replace that evaluation, and the importance of remaining in the ED until their evaluation is complete.  [address]   Anna Garcia [place], [place] 03/21/22 2108

## 2022-03-21 NOTE — ED Provider Notes (Signed)
[facility] EMERGENCY DEPARTMENT Provider Note   CSN: [id number] Arrival date & time: 03/21/22  2052     History  Chief Complaint  Patient presents with   Hyperglycemia    Anna Garcia is a 41 y.o. female.  She has a history of type 1 diabetes brittle and gastroparesis.  She has had 1 week of nausea and vomiting and not even tolerating liquids, vomiting up green liquid.  Denies any fever.  Is having some intermittent shooting abdominal pains which is usual for her.  Sugars normally are in the 100s but have been in the 300s.  Feeling generally weak she said she has been admitted multiple times this year for DKA and sepsis.  The history is provided by the patient.  Emesis Severity:  Severe Duration:  1 week Timing:  Intermittent Quality:  Bilious material Progression:  Unchanged Chronicity:  Recurrent Recent urination:  Decreased Relieved by:  Nothing Worsened by:  Liquids Associated symptoms: abdominal pain and myalgias   Associated symptoms: no cough, no diarrhea and no fever   Risk factors: no sick contacts        Home Medications Prior to Admission medications   Medication Sig Start Date End Date Taking? Authorizing Provider  BAQSIMI TWO PACK 3 MG/DOSE POWD Place 1 spray into both nostrils as needed (for a very low blood sugar emergency, when unable to eat or drink and need help from someone else- or as otherwise directed). 10/30/21   [provider]  dicyclomine (BENTYL) 10 MG capsule Take 1 capsule (10 mg total) by mouth 4 (four) times daily -  before meals and at bedtime. 12/11/21   Samuella Cota, MD  ferrous sulfate 325 (65 FE) MG tablet Take 1 tablet by mouth daily with breakfast. 12/19/21   Shelly Coss, MD  folic acid (FOLVITE) 1 MG tablet Take 1 tablet (1 mg total) by mouth daily. 01/05/22   Thurnell Lose, MD  furosemide (LASIX) 40 MG tablet Take 1 tablet (40 mg total) by mouth daily. 01/04/22   Thurnell Lose, MD  gabapentin  (NEURONTIN) 300 MG capsule Take 1 capsule (300 mg total) by mouth 3 (three) times daily. 05/28/21   Eugenie Filler, MD  insulin lispro (HUMALOG KWIKPEN) 100 UNIT/ML KwikPen Before each meal 3 times a day, 140-199 - 2 units, 200-250 - 4 units, 251-299 - 6 units,  300-349 - 8 units,  350 or above 10 units. 01/04/22   Thurnell Lose, MD  Insulin Pen Needle 32G X 4 MM MISC Use as directed up to 4 times daily. 01/04/22   Thurnell Lose, MD  lipase/protease/amylase (CREON) 36000 UNITS CPEP capsule Take 1 capsule (36,000 Units total) by mouth 3 (three) times daily with meals. 12/11/21   Samuella Cota, MD  loperamide (IMODIUM) 2 MG capsule Take 1 capsule (2 mg total) by mouth as needed for diarrhea or loose stools. Patient taking differently: Take 2 mg by mouth daily as needed for diarrhea or loose stools. 09/26/21   Regalado, Belkys A, MD  metoCLOPramide (REGLAN) 10 MG tablet Take 1 tablet by mouth every 6 hours as needed for nausea. 12/18/21 01/17/22  Shelly Coss, MD  midodrine (PROAMATINE) 10 MG tablet Take 1 tablet (10 mg total) by mouth 3 (three) times daily with meals. 01/04/22   Thurnell Lose, MD  mometasone-formoterol (DULERA) 100-5 MCG/ACT AERO Inhale 2 puffs into the lungs 2 (two) times daily. 07/07/20   Mercy Riding, MD  montelukast (  SINGULAIR) 10 MG tablet Take 10 mg by mouth at bedtime. 05/24/19   [provider]  MOTEGRITY 1 MG TABS Take 1 mg by mouth daily. 08/31/21   [provider]  pantoprazole (PROTONIX) 40 MG tablet Take 1 tablet (40 mg total) by mouth daily. 01/05/22   Thurnell Lose, MD  QUEtiapine (SEROQUEL) 25 MG tablet Take 1 tablet (25 mg total) by mouth at bedtime. 01/04/22   Thurnell Lose, MD  rosuvastatin (CRESTOR) 20 MG tablet Take 20 mg by mouth at bedtime. 05/24/19   [provider]  spironolactone (ALDACTONE) 25 MG tablet Take 1 tablet (25 mg total) by mouth daily. 01/04/22   Thurnell Lose, MD  sulfamethoxazole-trimethoprim (BACTRIM  DS) 800-160 MG tablet Take 1 tablet by mouth daily. 01/04/22   Thurnell Lose, MD  tamsulosin (FLOMAX) 0.4 MG CAPS capsule Take 1 capsule (0.4 mg total) by mouth daily. 01/04/22   Thurnell Lose, MD  insulin aspart (NOVOLOG) 100 UNIT/ML injection Inject 3 units three times a day with meals if you eat over 50% of your meals. In addition, use sliding scale as follows: CBG 70 - 120: 0 units CBG 121 - 150: 2 units CBG 151 - 200: 3 units CBG 201 - 250: 5 units CBG 251 - 300: 8 units CBG 301 - 350: 11 units CBG 351 - 400: 15 units CBG > 400: call MD and obtain STAT lab verification Patient not taking: No sig reported 07/07/20 10/13/20  Mercy Riding, MD      Allergies    Patient has no known allergies.    Review of Systems   Review of Systems  Constitutional:  Negative for fever.  Respiratory:  Negative for cough.   Gastrointestinal:  Positive for abdominal pain, nausea and vomiting. Negative for diarrhea.  Genitourinary:  Negative for dysuria.  Musculoskeletal:  Positive for myalgias.    Physical Exam Updated Vital Signs BP 100/73 (BP Location: Right Arm)   Pulse 95   Temp 98.5 F (36.9 C) (Oral)   Resp 18   Ht '5\' 4"'$  (1.626 m)   Wt 75 kg   SpO2 100%   BMI 28.38 kg/m  Physical Exam Vitals and nursing note reviewed.  Constitutional:      General: She is not in acute distress.    Appearance: Normal appearance. She is well-developed.  HENT:     Head: Normocephalic and atraumatic.  Eyes:     Conjunctiva/sclera: Conjunctivae normal.  Cardiovascular:     Rate and Rhythm: Normal rate and regular rhythm.     Heart sounds: No murmur heard. Pulmonary:     Effort: Pulmonary effort is normal. No respiratory distress.     Breath sounds: Normal breath sounds.  Abdominal:     Palpations: Abdomen is soft.     Tenderness: There is no abdominal tenderness. There is no guarding or rebound.  Musculoskeletal:        General: No deformity. Normal range of motion.     Cervical back: Neck  supple.  Skin:    General: Skin is warm and dry.     Capillary Refill: Capillary refill takes less than 2 seconds.     Coloration: Skin is pale.  Neurological:     General: No focal deficit present.     Mental Status: She is alert.     ED Results / Procedures / Treatments   Labs (all labs ordered are listed, but only abnormal results are displayed) Labs Reviewed  CBC -  Abnormal; Notable for the following components:      Result Value   MCV 101.1 (*)    All other components within normal limits  BASIC METABOLIC PANEL - Abnormal; Notable for the following components:   Sodium 133 (*)    CO2 15 (*)    Glucose, Bld 298 (*)    Creatinine, Ser 1.41 (*)    GFR, Estimated 48 (*)    Anion gap 17 (*)    All other components within normal limits  HEPATIC FUNCTION PANEL - Abnormal; Notable for the following components:   Albumin 3.3 (*)    Total Bilirubin 2.1 (*)    Bilirubin, Direct 0.4 (*)    Indirect Bilirubin 1.7 (*)    All other components within normal limits  MAGNESIUM - Abnormal; Notable for the following components:   Magnesium 1.5 (*)    All other components within normal limits  BETA-HYDROXYBUTYRIC ACID - Abnormal; Notable for the following components:   Beta-Hydroxybutyric Acid 7.90 (*)    All other components within normal limits  CBC - Abnormal; Notable for the following components:   RBC 3.13 (*)    Hemoglobin 10.5 (*)    HCT 32.3 (*)    MCV 103.2 (*)    Platelets 113 (*)    All other components within normal limits  MAGNESIUM - Abnormal; Notable for the following components:   Magnesium 1.4 (*)    All other components within normal limits  COMPREHENSIVE METABOLIC PANEL - Abnormal; Notable for the following components:   CO2 21 (*)    Glucose, Bld 155 (*)    BUN 5 (*)    Creatinine, Ser 1.10 (*)    Total Protein 6.2 (*)    Albumin 3.1 (*)    AST 42 (*)    Total Bilirubin 1.5 (*)    All other components within normal limits  PHOSPHORUS - Abnormal; Notable  for the following components:   Phosphorus 2.3 (*)    All other components within normal limits  CBG MONITORING, ED - Abnormal; Notable for the following components:   Glucose-Capillary 283 (*)    All other components within normal limits  I-STAT VENOUS BLOOD GAS, ED - Abnormal; Notable for the following components:   pCO2, Ven 22.5 (*)    pO2, Ven 29 (*)    Bicarbonate 13.6 (*)    TCO2 14 (*)    Acid-base deficit 9.0 (*)    Sodium 134 (*)    Calcium, Ion 1.13 (*)    All other components within normal limits  CBG MONITORING, ED - Abnormal; Notable for the following components:   Glucose-Capillary 286 (*)    All other components within normal limits  CBG MONITORING, ED - Abnormal; Notable for the following components:   Glucose-Capillary 224 (*)    All other components within normal limits  CBG MONITORING, ED - Abnormal; Notable for the following components:   Glucose-Capillary 175 (*)    All other components within normal limits  CBG MONITORING, ED - Abnormal; Notable for the following components:   Glucose-Capillary 146 (*)    All other components within normal limits  CBG MONITORING, ED - Abnormal; Notable for the following components:   Glucose-Capillary 140 (*)    All other components within normal limits  CBG MONITORING, ED - Abnormal; Notable for the following components:   Glucose-Capillary 146 (*)    All other components within normal limits  LIPASE, BLOOD  URINALYSIS, ROUTINE W REFLEX MICROSCOPIC  BLOOD GAS, VENOUS  I-STAT BETA HCG BLOOD, ED ([facility], [facility], [facility] ONLY)  CBG MONITORING, ED    EKG None  Radiology No results found.  Procedures .Critical Care  Performed by: Hayden Rasmussen, MD Authorized by: Hayden Rasmussen, MD   Critical care provider statement:    Critical care time (minutes):  45   Critical care time was exclusive of:  Separately billable procedures and treating other patients   Critical care was necessary to treat or prevent imminent or  life-threatening deterioration of the following conditions:  Metabolic crisis and endocrine crisis   Critical care was time spent personally by me on the following activities:  Development of treatment plan with patient or surrogate, discussions with consultants, evaluation of patient's response to treatment, examination of patient, obtaining history from patient or surrogate, ordering and performing treatments and interventions, ordering and review of laboratory studies, ordering and review of radiographic studies, pulse oximetry, re-evaluation of patient's condition and review of old charts   I assumed direction of critical care for this patient from another provider in my specialty: no       Medications Ordered in ED Medications  insulin regular, human (MYXREDLIN) 100 units/ 100 mL infusion (has no administration in time range)  lactated ringers infusion (has no administration in time range)  dextrose 5 % in lactated ringers infusion (has no administration in time range)  dextrose 50 % solution 0-50 mL (has no administration in time range)  lactated ringers bolus 1,000 mL (has no administration in time range)  ondansetron (ZOFRAN) injection 4 mg (has no administration in time range)  pantoprazole (PROTONIX) injection 40 mg (has no administration in time range)    ED Course/ Medical Decision Making/ A&P Clinical Course as of 03/22/22 1108  Thu Mar 21, 2022  2311 Discussed with Dr. Nevada Crane from [facility] who will evaluate patient for admission. [MB]    Clinical Course User Index [MB] Hayden Rasmussen, MD                           Medical Decision Making Amount and/or Complexity of Data Reviewed Labs: ordered.  Risk Prescription drug management. Decision regarding hospitalization.   This patient complains of nausea vomiting dehydration and poor p.o. intake elevated blood sugars general weakness; this involves an extensive number of treatment Options and is a complaint that  carries with it a high risk of complications and morbidity. The differential includes dehydration, metabolic derangement, DKA, hyperglycemia  I ordered, reviewed and interpreted labs, which included CBC with normal white count, low hemoglobin stable, chemistries with AKI and low bicarb elevated blood glucose elevated gap, magnesium low, VBG with normal pH, beta hydroxybutyrate elevated, pregnancy test negative I ordered medication IV fluids IV insulin IV PPI and nausea medication, IV magnesium and reviewed PMP when indicated. Previous records obtained and reviewed in epic, multiple recent admissions I consulted try hospitalist Dr. Nevada Crane and discussed lab and imaging findings and discussed disposition.  Cardiac monitoring reviewed, normal sinus rhythm Social determinants considered, no significant barriers Critical Interventions: Initiation of IV insulin for patient's Hyperglycemia and gap  After the interventions stated above, I reevaluated the patient and found patient still to be symptomatic although trending in the right direction Admission and further testing considered, she would benefit from mission for further management.  Patient in agreement with plan.         Final Clinical Impression(s) / ED Diagnoses Final diagnoses:  Nausea and vomiting, unspecified vomiting type  Hyperglycemia due to type 1 diabetes mellitus [facility])    Rx / DC Orders ED Discharge Orders     None         Hayden Rasmussen, MD 03/22/22 [phone]

## 2022-03-22 DIAGNOSIS — K7581 Nonalcoholic steatohepatitis (NASH): Secondary | ICD-10-CM

## 2022-03-22 DIAGNOSIS — K7460 Unspecified cirrhosis of liver: Secondary | ICD-10-CM

## 2022-03-22 DIAGNOSIS — R112 Nausea with vomiting, unspecified: Secondary | ICD-10-CM | POA: Diagnosis not present

## 2022-03-22 DIAGNOSIS — E1069 Type 1 diabetes mellitus with other specified complication: Secondary | ICD-10-CM

## 2022-03-22 DIAGNOSIS — G894 Chronic pain syndrome: Secondary | ICD-10-CM

## 2022-03-22 LAB — CBC
HCT: 32.3 % — ABNORMAL LOW (ref 36.0–46.0)
Hemoglobin: 10.5 g/dL — ABNORMAL LOW (ref 12.0–15.0)
MCH: 33.5 pg (ref 26.0–34.0)
MCHC: 32.5 g/dL (ref 30.0–36.0)
MCV: 103.2 fL — ABNORMAL HIGH (ref 80.0–100.0)
Platelets: 113 10*3/uL — ABNORMAL LOW (ref 150–400)
RBC: 3.13 MIL/uL — ABNORMAL LOW (ref 3.87–5.11)
RDW: 15.5 % (ref 11.5–15.5)
WBC: 6 10*3/uL (ref 4.0–10.5)
nRBC: 0 % (ref 0.0–0.2)

## 2022-03-22 LAB — CBG MONITORING, ED
Glucose-Capillary: 140 mg/dL — ABNORMAL HIGH (ref 70–99)
Glucose-Capillary: 146 mg/dL — ABNORMAL HIGH (ref 70–99)
Glucose-Capillary: 146 mg/dL — ABNORMAL HIGH (ref 70–99)
Glucose-Capillary: 175 mg/dL — ABNORMAL HIGH (ref 70–99)
Glucose-Capillary: 224 mg/dL — ABNORMAL HIGH (ref 70–99)
Glucose-Capillary: 93 mg/dL (ref 70–99)

## 2022-03-22 LAB — MAGNESIUM: Magnesium: 1.4 mg/dL — ABNORMAL LOW (ref 1.7–2.4)

## 2022-03-22 LAB — COMPREHENSIVE METABOLIC PANEL
ALT: 22 U/L (ref 0–44)
AST: 42 U/L — ABNORMAL HIGH (ref 15–41)
Albumin: 3.1 g/dL — ABNORMAL LOW (ref 3.5–5.0)
Alkaline Phosphatase: 68 U/L (ref 38–126)
Anion gap: 10 (ref 5–15)
BUN: 5 mg/dL — ABNORMAL LOW (ref 6–20)
CO2: 21 mmol/L — ABNORMAL LOW (ref 22–32)
Calcium: 9.5 mg/dL (ref 8.9–10.3)
Chloride: 105 mmol/L (ref 98–111)
Creatinine, Ser: 1.1 mg/dL — ABNORMAL HIGH (ref 0.44–1.00)
GFR, Estimated: >60 mL/min (ref >60)
Glucose, Bld: 155 mg/dL — ABNORMAL HIGH (ref 70–99)
Potassium: 3.8 mmol/L (ref 3.5–5.1)
Sodium: 136 mmol/L (ref 135–145)
Total Bilirubin: 1.5 mg/dL — ABNORMAL HIGH (ref 0.3–1.2)
Total Protein: 6.2 g/dL — ABNORMAL LOW (ref 6.5–8.1)

## 2022-03-22 LAB — BETA-HYDROXYBUTYRIC ACID: Beta-Hydroxybutyric Acid: 7.9 mmol/L — ABNORMAL HIGH (ref 0.05–0.27)

## 2022-03-22 LAB — PHOSPHORUS: Phosphorus: 2.3 mg/dL — ABNORMAL LOW (ref 2.5–4.6)

## 2022-03-22 MED ORDER — METOCLOPRAMIDE HCL 10 MG PO TABS: 10.0000 mg | ORAL_TABLET | Freq: Four times a day (QID) | ORAL | 0 refills | Status: AC | PRN

## 2022-03-22 MED ORDER — MAGNESIUM SULFATE 2 GM/50ML IV SOLN
2.0000 g | Freq: Once | INTRAVENOUS | Status: AC
  Administered 2022-03-22: 2 g via INTRAVENOUS
  Filled 2022-03-22: qty 50

## 2022-03-22 MED ORDER — SULFAMETHOXAZOLE-TRIMETHOPRIM 800-160 MG PO TABS: 1.0000 | ORAL_TABLET | Freq: Every day | ORAL | Status: DC

## 2022-03-22 MED ORDER — INSULIN ASPART 100 UNIT/ML IJ SOLN: 5.0000 [IU] | Freq: Three times a day (TID) | INTRAMUSCULAR | Status: DC

## 2022-03-22 MED ORDER — QUETIAPINE FUMARATE 25 MG PO TABS: 25.0000 mg | ORAL_TABLET | Freq: Every day | ORAL | Status: DC

## 2022-03-22 MED ORDER — INSULIN GLARGINE-YFGN 100 UNIT/ML ~~LOC~~ SOLN
10.0000 [IU] | Freq: Every day | SUBCUTANEOUS | Status: DC
  Filled 2022-03-22: qty 0.1

## 2022-03-22 MED ORDER — PANCRELIPASE (LIP-PROT-AMYL) 36000-114000 UNITS PO CPEP
36000.0000 [IU] | ORAL_CAPSULE | Freq: Three times a day (TID) | ORAL | Status: DC
  Filled 2022-03-22 (×2): qty 1

## 2022-03-22 MED ORDER — MIDODRINE HCL 5 MG PO TABS
10.0000 mg | ORAL_TABLET | Freq: Three times a day (TID) | ORAL | Status: DC
  Administered 2022-03-22: 10 mg via ORAL
  Filled 2022-03-22: qty 2

## 2022-03-22 MED ORDER — PROCHLORPERAZINE EDISYLATE 10 MG/2ML IJ SOLN: 5.0000 mg | Freq: Four times a day (QID) | INTRAMUSCULAR | Status: DC | PRN

## 2022-03-22 MED ORDER — INSULIN ASPART 100 UNIT/ML IJ SOLN
0.0000 [IU] | Freq: Three times a day (TID) | INTRAMUSCULAR | Status: DC
  Administered 2022-03-22: 1 [IU] via SUBCUTANEOUS

## 2022-03-22 MED ORDER — METOCLOPRAMIDE HCL 5 MG/ML IJ SOLN
10.0000 mg | Freq: Three times a day (TID) | INTRAMUSCULAR | Status: DC
  Administered 2022-03-22: 10 mg via INTRAVENOUS
  Filled 2022-03-22: qty 2

## 2022-03-22 MED ORDER — DICYCLOMINE HCL 10 MG PO CAPS
10.0000 mg | ORAL_CAPSULE | Freq: Three times a day (TID) | ORAL | Status: DC
  Administered 2022-03-22: 10 mg via ORAL
  Filled 2022-03-22: qty 1

## 2022-03-22 MED ORDER — GABAPENTIN 100 MG PO CAPS: 200.0000 mg | ORAL_CAPSULE | Freq: Three times a day (TID) | ORAL | Status: DC

## 2022-03-22 NOTE — ED Notes (Signed)
Informed pt that urine sample is still needed. Pt refused in/out, and states " I will try to go soon"

## 2022-03-22 NOTE — Progress Notes (Signed)
TRH night cross cover note:   I was contacted by RN to assist with transition to subcutaneous insulin this patient has been on insulin drip via Endo tool with presenting hyperglycemia anion gap metabolic acidosis in the context of known underlying history of DM1. At the time I was contacte CBG x3 will benefit from 150 and insulin drip had already been discontinued prior to my involvement.  Additionally, D5 LR have also been discontinued prior to my involvement.   Per my brief chart review, it appears that the patient is on sliding scale short acting insulin as an outpatient, although I did not see any scheduled basal insulin as an outpatient. In determining initial dose for basal insulin for the purpose of transitioning off insulin drip in this setting, transition OS recommended consideration for 0.3 units per kg total daily basal insulin requirement.  In this patient's case with most recently documented weight of 75 kg, this would equate to slightly more than 22 units of basal insulin over the course of a day. I ended up ordering roughly half this, with order placed for Lantus 10 units SQ daily, first dose now.  Also ensured ensuing orders for before every meal and at bedtime CBG monitoring with sliding scale insulin. She had existing orders for scheduled short-acting insulin with meals as well.       Babs Bertin, DO Hospitalist

## 2022-03-22 NOTE — Discharge Summary (Addendum)
PATIENT DETAILS Name: Anna Garcia Age: 41 y.o. Sex: female Date of Birth: [birth year] MRN: [id number]. Admitting Physician: Kayleen Memos, DO PCP:Day, [name], MD  Admit Date: 03/21/2022 Discharge date: 03/22/2022  Recommendations for Outpatient Follow-up:  Follow up with PCP in 1-2 weeks Please obtain CMP/CBC/Mg  in one week  Admitted From:  Home  Disposition: Home   Discharge Condition: fair  CODE STATUS:   Code Status: Full Code   Diet recommendation:  Diet Order             Diet full liquid Room service appropriate? Yes; Fluid consistency: Thin  Diet effective now           Diet Carb Modified                    Brief Summary: 41 year old female with history of liver cirrhosis, prior SBP on prophylactic antibiotics, DM-1, diabetic gastroparesis, HTN 1, HLD, neurogenic bladder-s/p I/O at home, chronic pain syndrome-who presented nausea/vomiting-x 1 week-likely flare of underlying gastroparesis.  She was managed in the ED with supportive care-briefly admitted to the hospitalist service, with significant improvement.  On 12/1-patient wanted to be discharged home as she felt much better.  Brief Hospital Course: Nausea/vomiting likely due to gastroparesis flare Tolerating clear liquids-has been advanced to full liquids-adamant on leaving the hospital today.  I have asked her to slowly advance her diet at home. Counseled regarding importance of small portion meals-was given a dose of Reglan this morning-we will place on as needed Reglan She follows with gastroenterologist at [facility]-I have asked her to call for a follow-up appointment. She is to return to the ED-if her vomiting gets worse. Note-abdominal exam is benign-her last bowel movement was yesterday.  No evidence of SBO clinically.  History of liver cirrhosis History of SBP Her primary GI MD-switched her from Bactrim to ciprofloxacin for SBP prophylaxis Hardly any edema in her  lower extremity today-abdomen is nondistended.  No evidence of volume overload Continue Aldactone/furosemide Continue outpatient follow-up with her primary gastroenterologist.  Hypomagnesemia Repleted Recheck at PCPs office  DM-1 Resume her outpatient insulin regimen  HLD:  Continue statin  Chronic pain syndrome Resume Nucynta as previous  Moderate persistent asthma Not in flare Continue inhaler regimen  History of esophageal stricture s/p dilatation in June 2023 No dysphagia symptoms Follow with primary GI for further continued care. Continue PPI  BMI: Estimated body mass index is 28.38 kg/m as calculated from the following:   Height as of this encounter: _0  (1.626 m).   Weight as of this encounter: 75 kg.   Discharge Diagnoses:  Principal Problem:   Intractable nausea and vomiting   Discharge Instructions:  Activity:  As tolerated   Discharge Instructions     Call MD for:  persistant dizziness or light-headedness   Complete by: As directed    Call MD for:  persistant nausea and vomiting   Complete by: As directed    Diet Carb Modified   Complete by: As directed    Discharge instructions   Complete by: As directed    Follow with Primary MD  Day, [name], MD in 1-2 weeks  Follow-up with your primary gastroenterologist in 1 week  Stay on full liquid diet/soft diet for a few days before advancing further.  Please consume small portion meals given your history of diabetic gastroparesis.  Please get a complete blood count and chemistry panel checked by your Primary MD at your next  visit, and again as instructed by your Primary MD.  Get Medicines reviewed and adjusted: Please take all your medications with you for your next visit with your Primary MD  Laboratory/radiological data: Please request your Primary MD to go over all hospital tests and procedure/radiological results at the follow up, please ask your Primary MD to get all Hospital records sent to  his/her office.  In some cases, they will be blood work, cultures and biopsy results pending at the time of your discharge. Please request that your primary care M.D. follows up on these results.  Also Note the following: If you experience worsening of your admission symptoms, develop shortness of breath, life threatening emergency, suicidal or homicidal thoughts you must seek medical attention immediately by calling 911 or calling your MD immediately  if symptoms less severe.  You must read complete instructions/literature along with all the possible adverse reactions/side effects for all the Medicines you take and that have been prescribed to you. Take any new Medicines after you have completely understood and accpet all the possible adverse reactions/side effects.   Do not drive when taking Pain medications or sleeping medications (Benzodaizepines)  Do not take more than prescribed Pain, Sleep and Anxiety Medications. It is not advisable to combine anxiety,sleep and pain medications without talking with your primary care practitioner  Special Instructions: If you have smoked or chewed Tobacco  in the last 2 yrs please stop smoking, stop any regular Alcohol  and or any Recreational drug use.  Wear Seat belts while driving.  Please note: You were cared for by a hospitalist during your hospital stay. Once you are discharged, your primary care physician will handle any further medical issues. Please note that NO REFILLS for any discharge medications will be authorized once you are discharged, as it is imperative that you return to your primary care physician (or establish a relationship with a primary care physician if you do not have one) for your post hospital discharge needs so that they can reassess your need for medications and monitor your lab values.   Increase activity slowly   Complete by: As directed       Allergies as of 03/22/2022   No Known Allergies      Medication List      STOP taking these medications    dicyclomine 10 MG capsule Commonly known as: BENTYL   midodrine 10 MG tablet Commonly known as: PROAMATINE   QUEtiapine 25 MG tablet Commonly known as: SEROQUEL   sulfamethoxazole-trimethoprim 800-160 MG tablet Commonly known as: BACTRIM DS       TAKE these medications    Apidra SoloStar 100 UNIT/ML Solostar Pen Generic drug: insulin glulisine Inject 1-15 Units into the skin as needed (for high BS at meals and an BS spikes).   Baqsimi Two Pack 3 MG/DOSE Powd Generic drug: Glucagon Place 1 spray into both nostrils as needed (for a very low blood sugar emergency, when unable to eat or drink and need help from someone else- or as otherwise directed).   ciprofloxacin 500 MG tablet Commonly known as: CIPRO Take 500 mg by mouth daily.   Dexilant 60 MG capsule Generic drug: dexlansoprazole Take 60 mg by mouth daily.   famotidine 40 MG tablet Commonly known as: PEPCID Take 40 mg by mouth 2 (two) times daily.   furosemide 20 MG tablet Commonly known as: LASIX Take 20 mg by mouth daily.   gabapentin 300 MG capsule Commonly known as: NEURONTIN Take 1 capsule (300 mg  total) by mouth 3 (three) times daily. What changed: how much to take   HumaLOG KwikPen 100 UNIT/ML KwikPen Generic drug: insulin lispro Before each meal 3 times a day, 140-199 - 2 units, 200-250 - 4 units, 251-299 - 6 units,  300-349 - 8 units,  350 or above 10 units.   Lantus SoloStar 100 UNIT/ML Solostar Pen Generic drug: insulin glargine Inject 10 Units into the skin at bedtime.   lipase/protease/amylase 36000 UNITS Cpep capsule Commonly known as: CREON Take 1 capsule (36,000 Units total) by mouth 3 (three) times daily with meals. What changed:  when to take this additional instructions   loperamide 2 MG capsule Commonly known as: IMODIUM Take 1 capsule (2 mg total) by mouth as needed for diarrhea or loose stools. What changed: when to take this    metoCLOPramide 10 MG tablet Commonly known as: REGLAN Take 1 tablet by mouth every 6 hours as needed for nausea.   mometasone-formoterol 100-5 MCG/ACT Aero Commonly known as: DULERA Inhale 2 puffs into the lungs 2 (two) times daily.   montelukast 10 MG tablet Commonly known as: SINGULAIR Take 10 mg by mouth at bedtime.   Motegrity 1 MG Tabs Generic drug: Prucalopride Succinate Take 1 mg by mouth daily.   naloxone 4 MG/0.1ML Liqd nasal spray kit Commonly known as: NARCAN Place 0.4 mg into the nose once.   rosuvastatin 20 MG tablet Commonly known as: CRESTOR Take 20 mg by mouth at bedtime.   spironolactone 50 MG tablet Commonly known as: ALDACTONE Take 50 mg by mouth daily.   tapentadol HCl 75 MG tablet Commonly known as: NUCYNTA Take 75 mg by mouth 2 (two) times daily as needed for moderate pain.        Follow-up Information     Day, [name], MD. Schedule an appointment as soon as possible for a visit in 1 week(s).   Specialty: Internal Medicine Contact information: [place] [address] [phone]                No Known Allergies   Other Procedures/Studies: No results found.   TODAY-DAY OF DISCHARGE:  Subjective:   Lanett Lasorsa today has no headache,no chest abdominal pain,no new weakness tingling or numbness, feels much better wants to go home today.   Objective:   Blood pressure 117/81, pulse 80, temperature 98.5 F (36.9 C), temperature source Oral, resp. rate 15, height _0  (1.626 m), weight 75 kg, SpO2 100 %.  Intake/Output Summary (Last 24 hours) at 03/22/2022 0909 Last data filed at 03/22/2022 0054 Gross per 24 hour  Intake 1000 ml  Output --  Net 1000 ml   Filed Weights   03/21/22 2224  Weight: 75 kg    Exam: Awake Alert, Oriented *3, No new F.N deficits, Normal affect [place].AT,PERRAL Supple Neck,No JVD, No cervical lymphadenopathy appriciated.  Symmetrical Chest wall movement, Good air movement  bilaterally, CTAB RRR,No Gallops,Rubs or new Murmurs, No Parasternal Heave +ve B.Sounds, Abd Soft, Non tender, No organomegaly appriciated, No rebound -guarding or rigidity. No Cyanosis, Clubbing or edema, No new Rash or bruise   PERTINENT RADIOLOGIC STUDIES: No results found.   PERTINENT LAB RESULTS: CBC: Recent Labs    03/21/22 2119 03/21/22 2136 03/22/22 0522  WBC 6.2  --  6.0  HGB 14.7 14.6 10.5*  HCT 44.7 43.0 32.3*  PLT 154  --  113*   CMET CMP     Component Value Date/Time   NA 136 03/22/2022 0628  K 3.8 03/22/2022 0628   CL 105 03/22/2022 0628   CO2 21 (L) 03/22/2022 0628   GLUCOSE 155 (H) 03/22/2022 0628   BUN 5 (L) 03/22/2022 0628   CREATININE 1.10 (H) 03/22/2022 0628   CALCIUM 9.5 03/22/2022 0628   PROT 6.2 (L) 03/22/2022 0628   ALBUMIN 3.1 (L) 03/22/2022 0628   AST 42 (H) 03/22/2022 0628   ALT 22 03/22/2022 0628   ALKPHOS 68 03/22/2022 0628   BILITOT 1.5 (H) 03/22/2022 0628   GFRNONAA >60 03/22/2022 0628   GFRAA >60 06/03/2019 1815    GFR Estimated Creatinine Clearance: 67.4 mL/min (A) (by C-G formula based on SCr of 1.1 mg/dL (H)). Recent Labs    03/21/22 2119  LIPASE 28   No results for input(s): "CKTOTAL", "CKMB", "CKMBINDEX", "TROPONINI" in the last 72 hours. Invalid input(s): "POCBNP" No results for input(s): "DDIMER" in the last 72 hours. No results for input(s): "HGBA1C" in the last 72 hours. No results for input(s): "CHOL", "HDL", "LDLCALC", "TRIG", "CHOLHDL", "LDLDIRECT" in the last 72 hours. No results for input(s): "TSH", "T4TOTAL", "T3FREE", "THYROIDAB" in the last 72 hours.  Invalid input(s): "FREET3" No results for input(s): "VITAMINB12", "FOLATE", "FERRITIN", "TIBC", "IRON", "RETICCTPCT" in the last 72 hours. Coags: No results for input(s): "INR" in the last 72 hours.  Invalid input(s): "PT" Microbiology: No results found for this or any previous visit (from the past 240 hour(s)).  FURTHER DISCHARGE INSTRUCTIONS:  Get  Medicines reviewed and adjusted: Please take all your medications with you for your next visit with your Primary MD  Laboratory/radiological data: Please request your Primary MD to go over all hospital tests and procedure/radiological results at the follow up, please ask your Primary MD to get all Hospital records sent to his/her office.  In some cases, they will be blood work, cultures and biopsy results pending at the time of your discharge. Please request that your primary care M.D. goes through all the records of your hospital data and follows up on these results.  Also Note the following: If you experience worsening of your admission symptoms, develop shortness of breath, life threatening emergency, suicidal or homicidal thoughts you must seek medical attention immediately by calling 911 or calling your MD immediately  if symptoms less severe.  You must read complete instructions/literature along with all the possible adverse reactions/side effects for all the Medicines you take and that have been prescribed to you. Take any new Medicines after you have completely understood and accpet all the possible adverse reactions/side effects.   Do not drive when taking Pain medications or sleeping medications (Benzodaizepines)  Do not take more than prescribed Pain, Sleep and Anxiety Medications. It is not advisable to combine anxiety,sleep and pain medications without talking with your primary care practitioner  Special Instructions: If you have smoked or chewed Tobacco  in the last 2 yrs please stop smoking, stop any regular Alcohol  and or any Recreational drug use.  Wear Seat belts while driving.  Please note: You were cared for by a hospitalist during your hospital stay. Once you are discharged, your primary care physician will handle any further medical issues. Please note that NO REFILLS for any discharge medications will be authorized once you are discharged, as it is imperative that you  return to your primary care physician (or establish a relationship with a primary care physician if you do not have one) for your post hospital discharge needs so that they can reassess your need for medications and monitor your lab  values.  Total Time spent coordinating discharge including counseling, education and face to face time equals greater than 30 minutes.  SignedOren Binet 03/22/2022 9:09 AM

## 2022-03-22 NOTE — Care Management CC44 (Signed)
Condition Code 44 Documentation Completed  Patient Details  Name: Anna Garcia MRN: [id number] Date of Birth: [birth year]   Condition Code 44 given:  Yes Patient signature on Condition Code 44 notice:  Yes Documentation of 2 MD's agreement:  Yes Code 44 added to claim:  Yes    Fuller Mandril, RN 03/22/2022, 8:48 AM

## 2022-03-22 NOTE — ED Notes (Signed)
Patient did not want morning medications, states she would take her medicines when she gets home.

## 2022-03-22 NOTE — ED Notes (Signed)
Patient awake and alert this morning, no s/s of distress, patient states she wants to leave, she declined 5 units of insulin, states she doesn't want her glucose to get to low, will continue to monitor.

## 2022-03-22 NOTE — Care Management Obs Status (Signed)
[facility] NOTIFICATION   Patient Details  Name: Anna Garcia MRN: [id number] Date of Birth: 1980/08/03   Medicare Observation Status Notification Given:  Yes    Fuller Mandril, RN 03/22/2022, 8:48 AM

## 2022-03-22 NOTE — ED Notes (Signed)
Pt refused long acting insulin. Pt also requesting to leave due to transportation issues. Pt states she will wait for admitting provider before leaving

## 2022-06-27 IMAGING — DX DG CHEST 1V PORT
1 series · 1 of 1 positions shown · non-contrast
Comparison: None.

CLINICAL DATA: Asthma, short of breath

EXAM:
PORTABLE CHEST 1 VIEW

[chest ap]
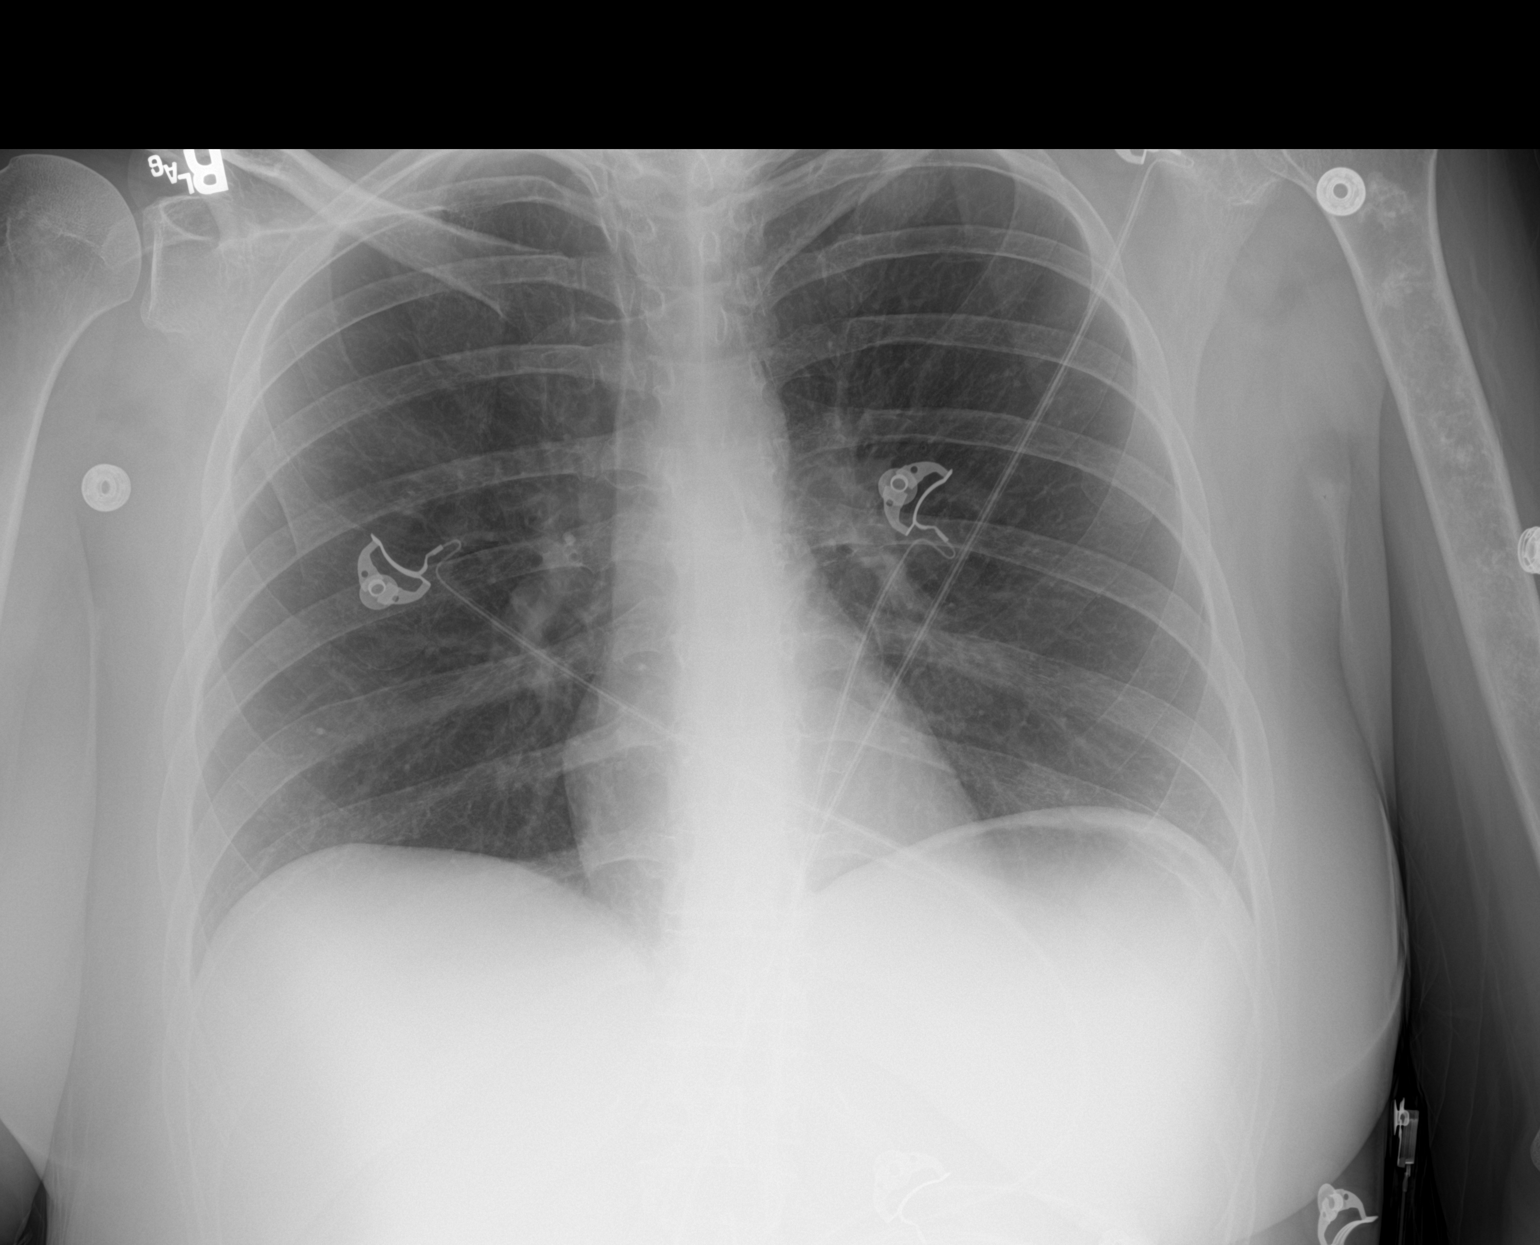

[1 of 1 positions shown; findings below may reference images not displayed]

FINDINGS: Frontal and lateral views of the chest demonstrate an unremarkable
cardiac silhouette. No acute airspace disease, effusion, or
pneumothorax. Areas of sclerosis throughout the visualized humeral
diaphysis could reflect prior bone infarct. No acute displaced
fracture.
IMPRESSION: 1. No acute intrathoracic process.
2. Intramedullary sclerosis within the left humeral diaphysis,
nonspecific but possibly related to prior bone infarct.

## 2022-06-28 IMAGING — US US RENAL
2 series · 14 of 25 positions shown · non-contrast
Comparison: None.

CLINICAL DATA: Acute kidney injury

EXAM:
RENAL / URINARY TRACT ULTRASOUND COMPLETE

[Series 1: us renal · 13 of 28 slices shown (1 of 2)]
[im 1/28]
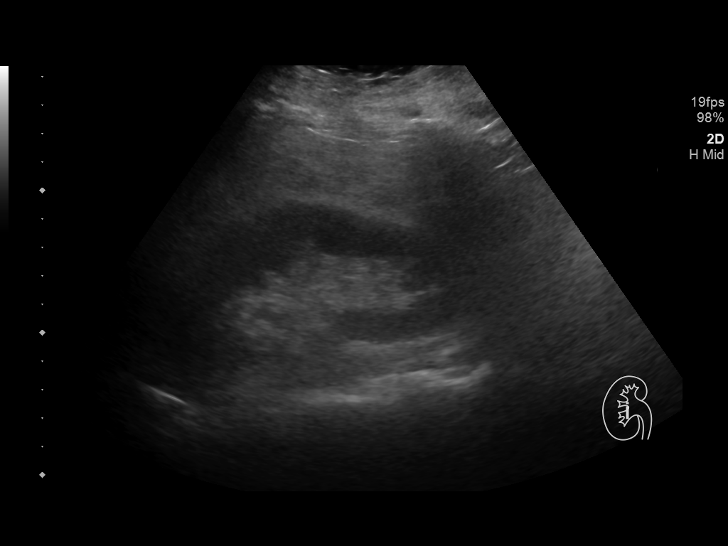
[im 3/28]
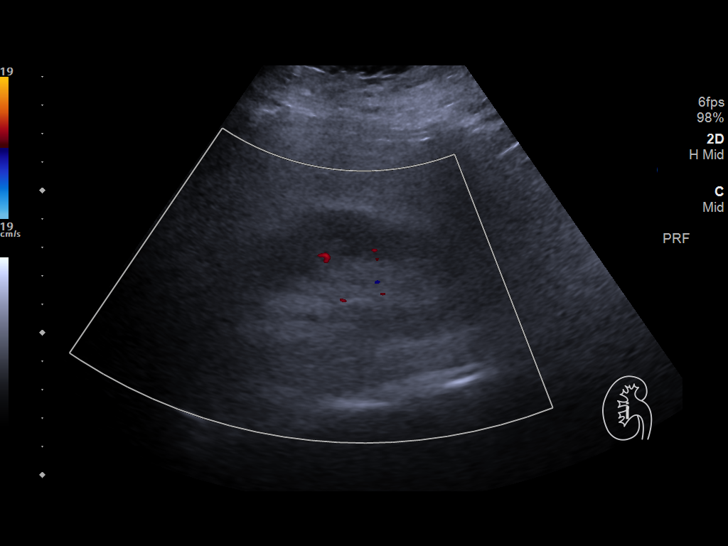
[im 5/28]
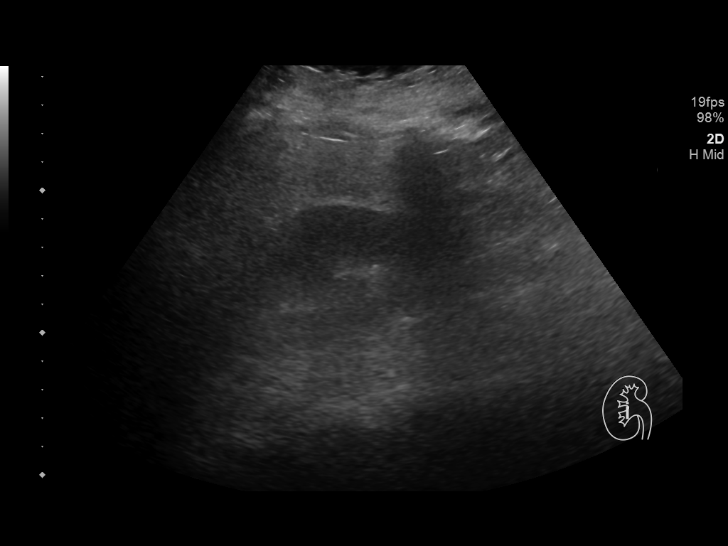
[im 8/28]
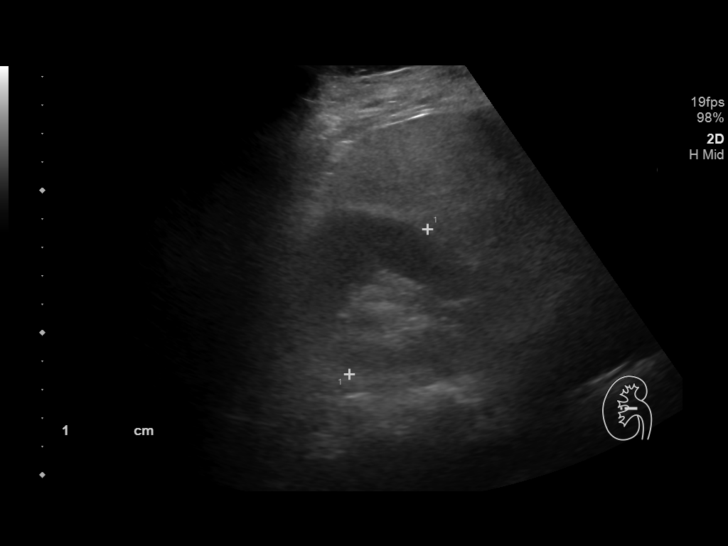
[im 10/28]
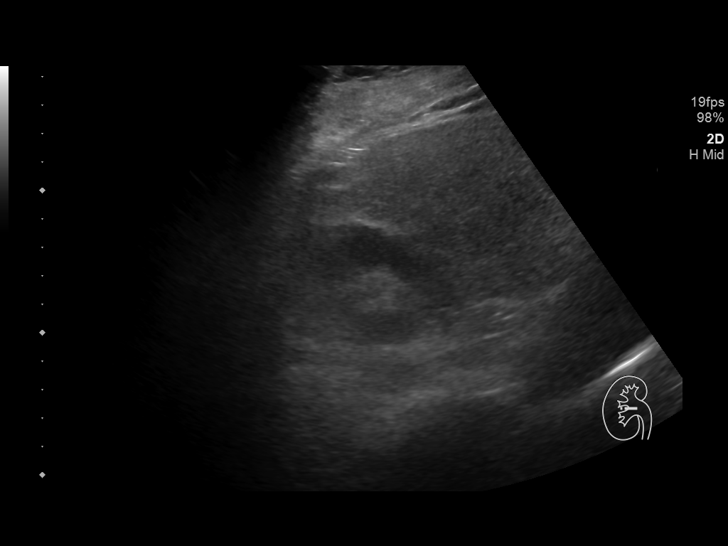
[im 11/28]
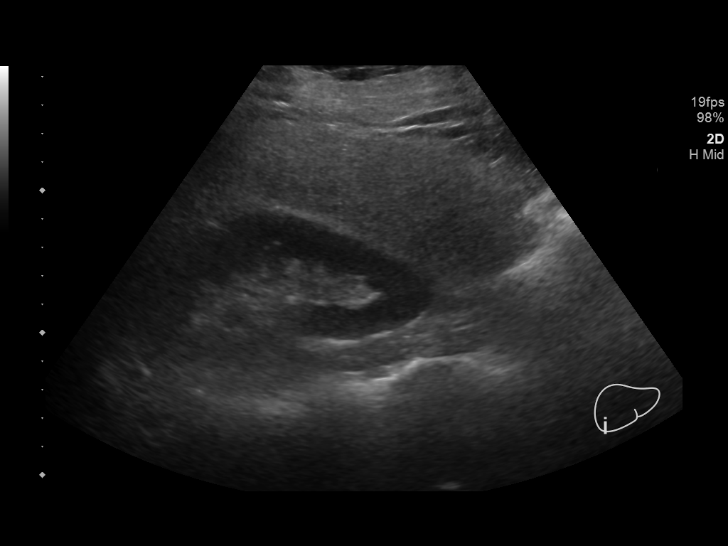
[im 13/28]
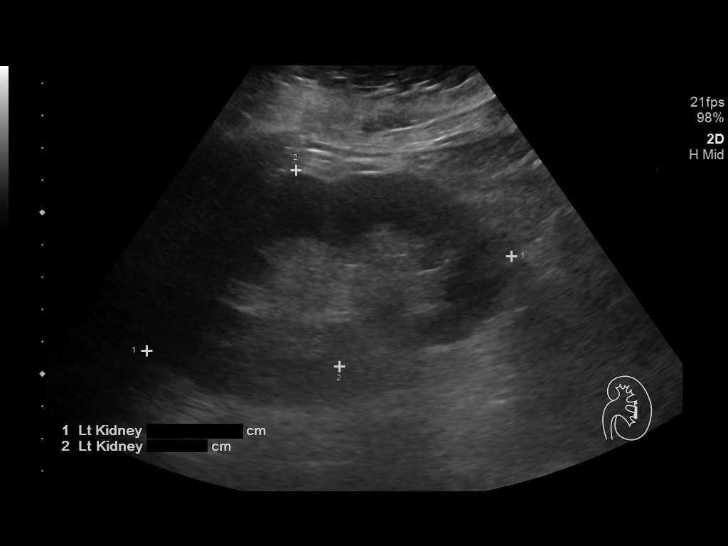
[im 16/28]
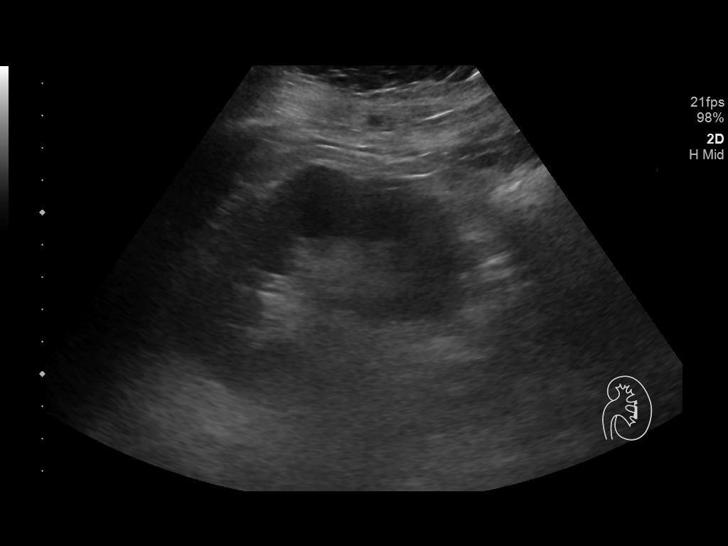
[im 18/28]
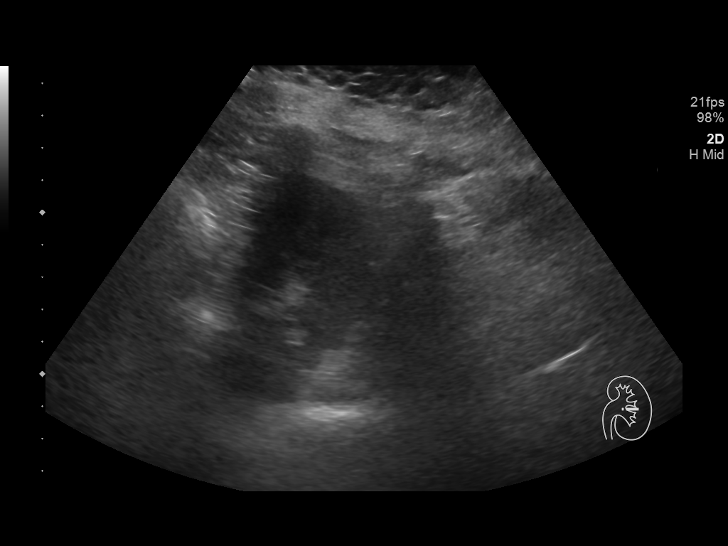
[im 19/28]
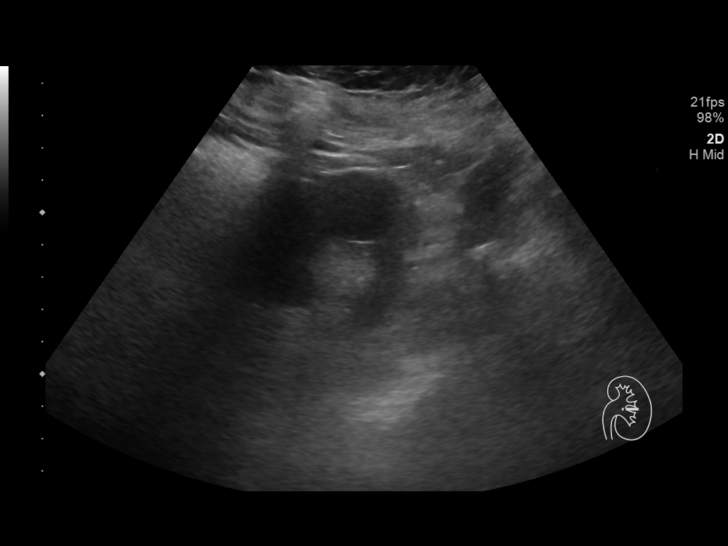
[im 22/28]
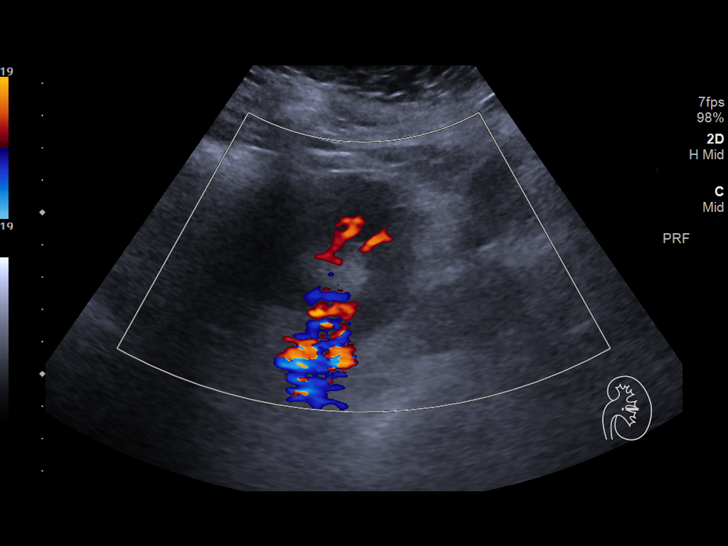
[im 24/28]
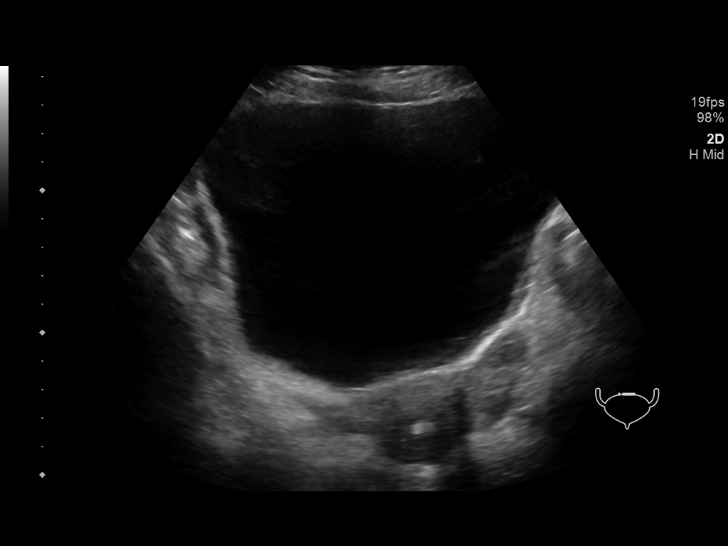
[im 26/28]
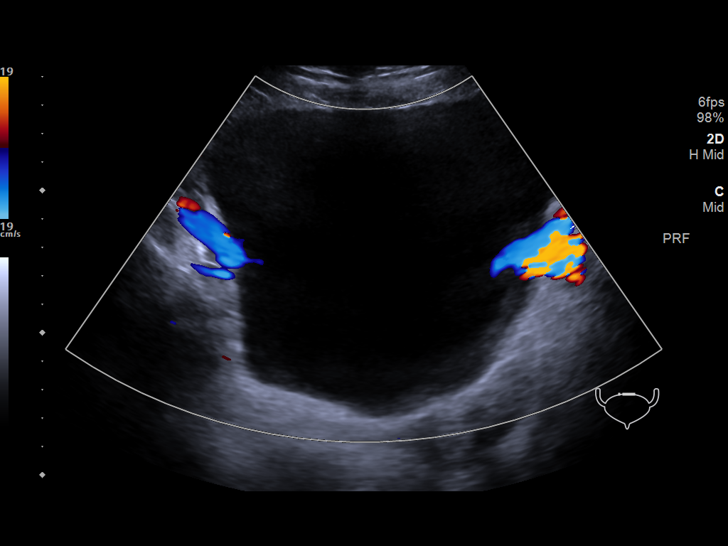

[Series 3: us renal · 1 of 1 slices shown (2 of 2)]
[im 1/1]
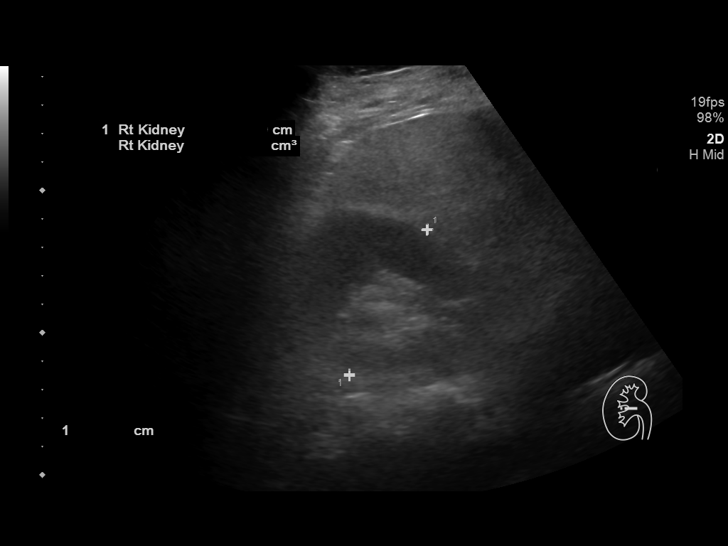

[14 of 25 positions shown; findings below may reference images not displayed]

FINDINGS: Right Kidney:

Renal measurements: 11.6 x 5.7 x 5.8 cm = volume: 202 mL.
Echogenicity within normal limits. No mass or hydronephrosis
visualized.

Left Kidney:

Renal measurements: 11.7 x 6.2 x 5.5 cm = volume: 210 mL.
Echogenicity within normal limits. No mass or hydronephrosis
visualized.

Bladder:

Appears normal for degree of bladder distention.

Other:

None.
IMPRESSION: No acute findings.  No hydronephrosis.

## 2022-11-03 ENCOUNTER — Other Ambulatory Visit: Payer: Self-pay | Admitting: Internal Medicine

## 2023-09-14 IMAGING — DX DG CHEST 1V PORT
1 series · 1 of 1 positions shown · non-contrast
Comparison: None Available.

CLINICAL DATA: Short of breath

EXAM:
PORTABLE CHEST 1 VIEW

[chest ap]
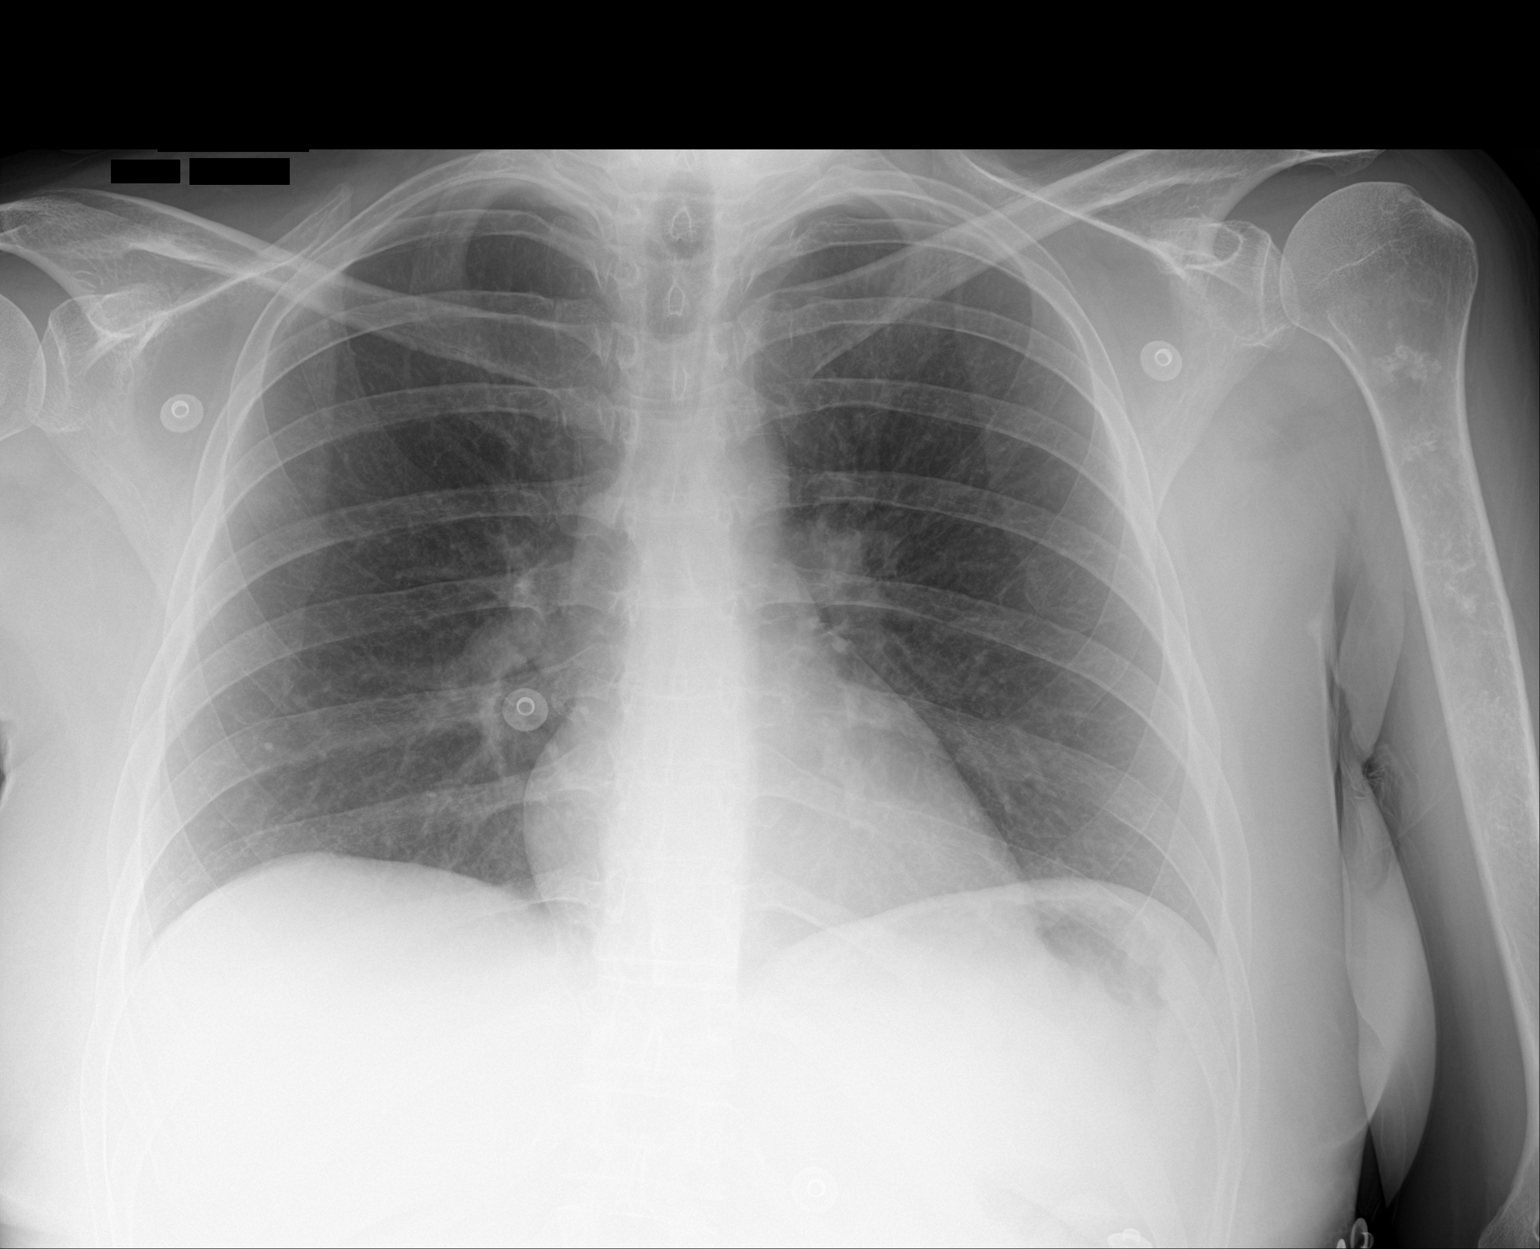

[1 of 1 positions shown; findings below may reference images not displayed]

FINDINGS: The lungs are clear and negative for focal airspace consolidation,
pulmonary edema or suspicious pulmonary nodule. No pleural effusion
or pneumothorax. Cardiac and mediastinal contours are within normal
limits. No acute fracture or lytic or blastic osseous lesions. The
visualized upper abdominal bowel gas pattern is unremarkable.
IMPRESSION: Negative chest x-ray.

## 2023-09-16 IMAGING — US US ABDOMEN LIMITED
1 series · 15 of 25 positions shown · non-contrast
Comparison: None Available.

CLINICAL DATA: Right upper quadrant ultrasound. Hypertension,
diabetes. Hemangioma.

EXAM:
ULTRASOUND ABDOMEN LIMITED RIGHT UPPER QUADRANT

[Series 1: us abdomen limited ruq mc & wl · 15 of 66 slices shown]
[im 1/66]
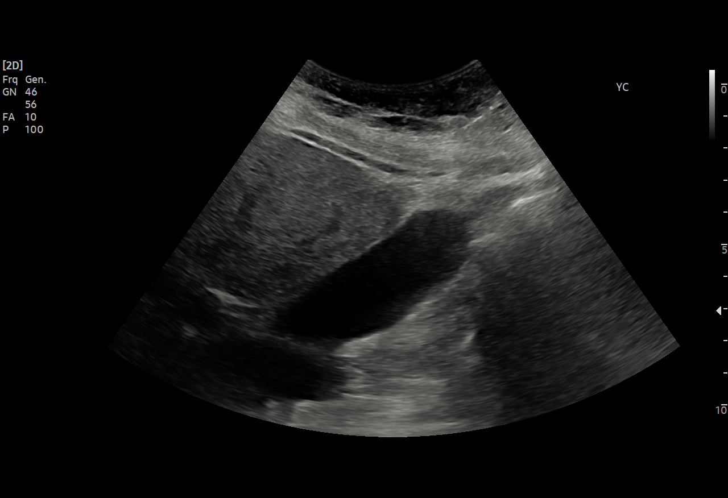
[im 6/66]
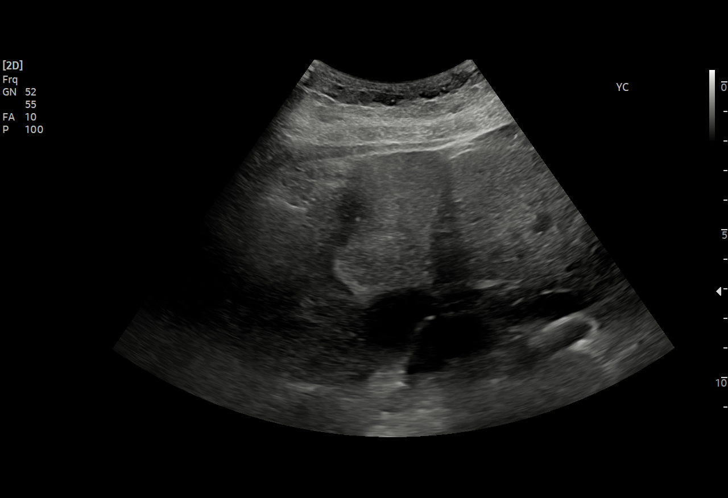
[im 11/66]
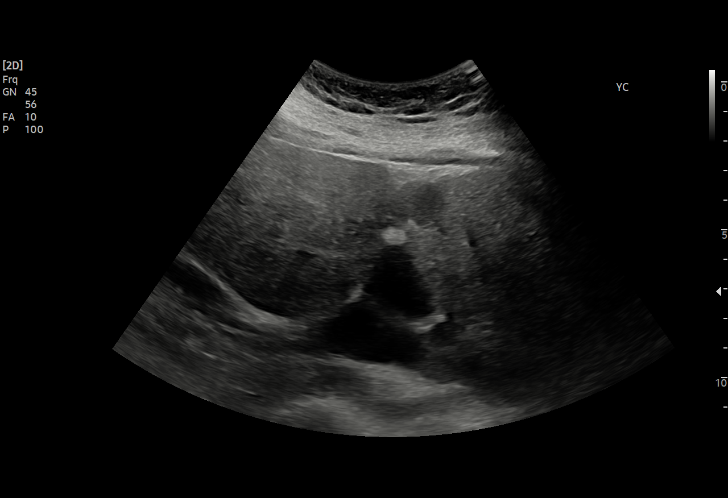
[im 14/66]
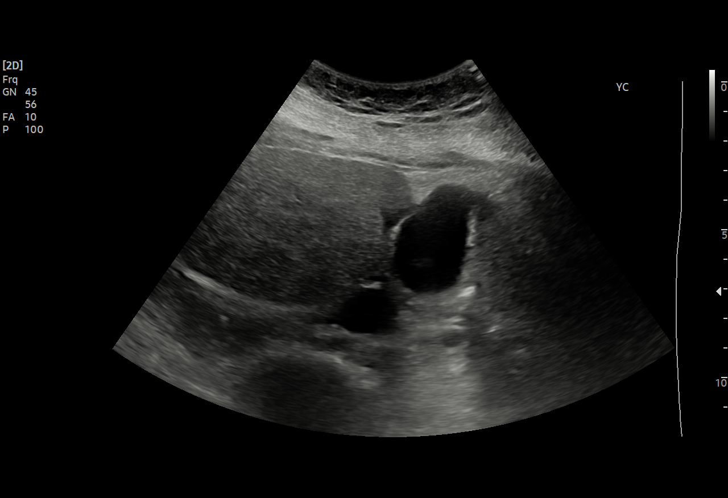
[im 19/66]
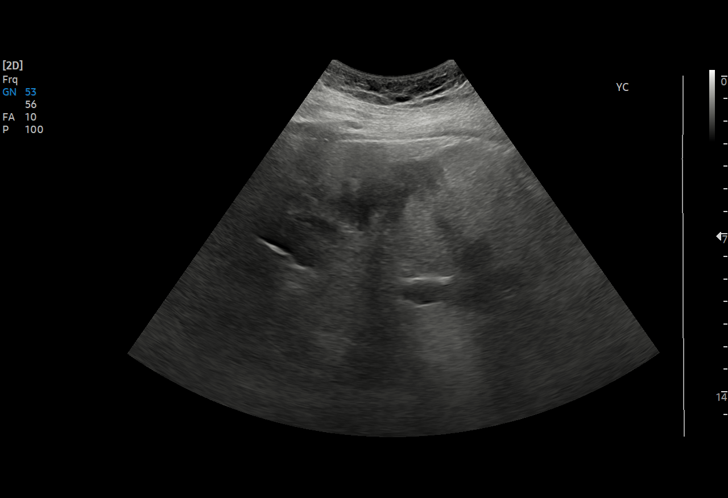
[im 25/66]
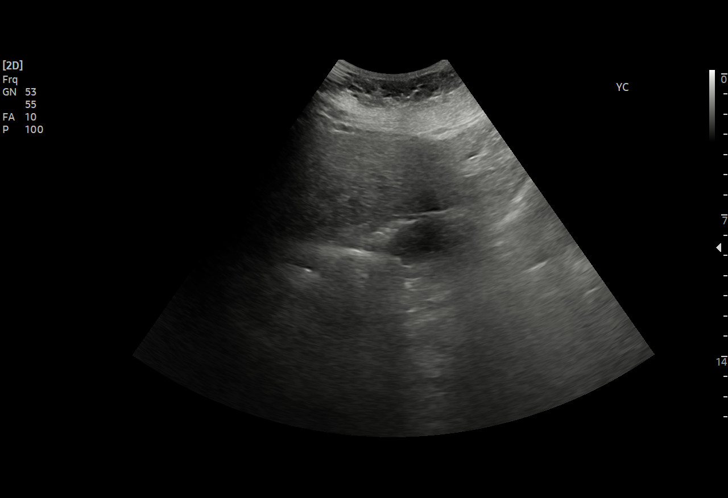
[im 28/66]
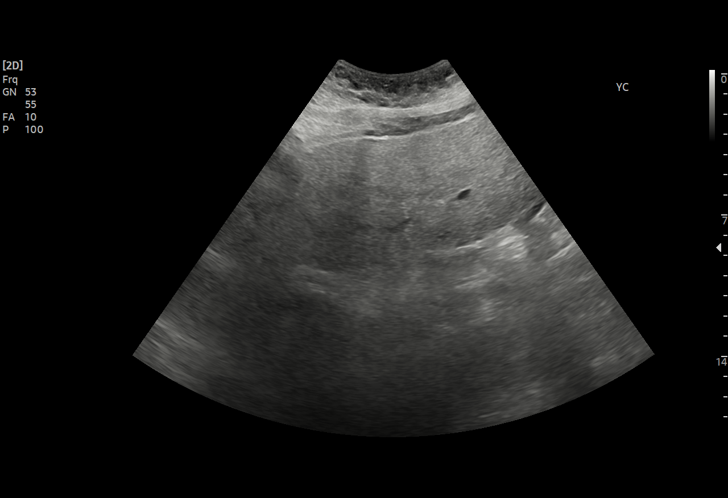
[im 33/66]
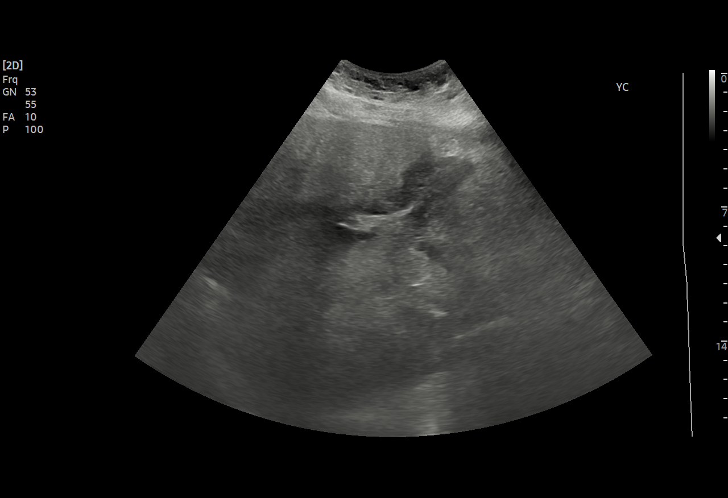
[im 38/66]
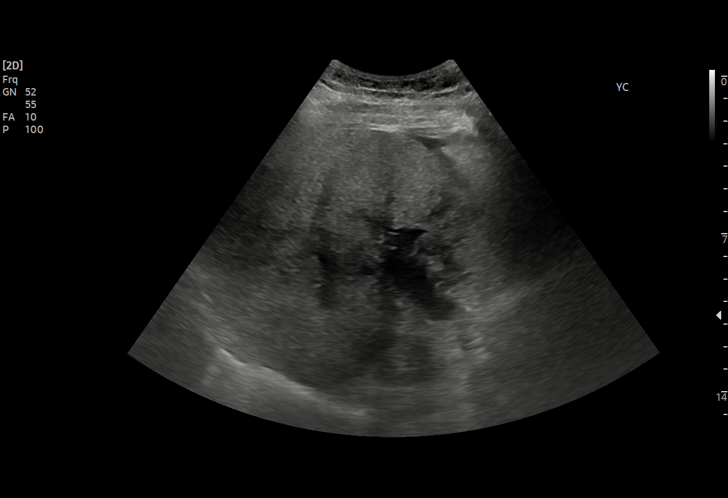
[im 41/66]
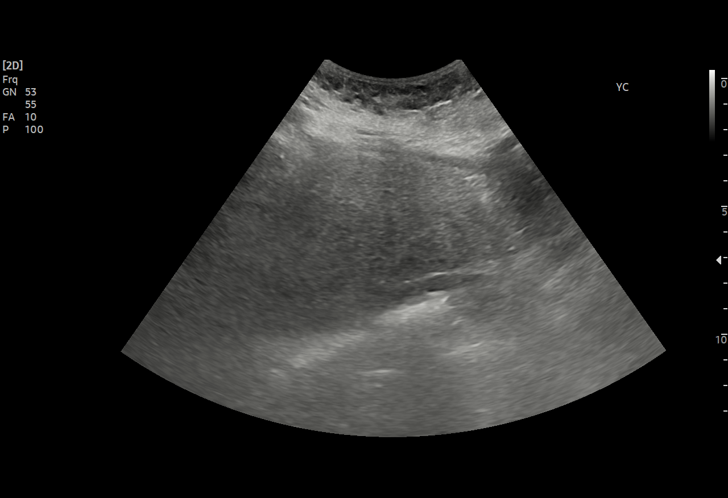
[im 47/66]
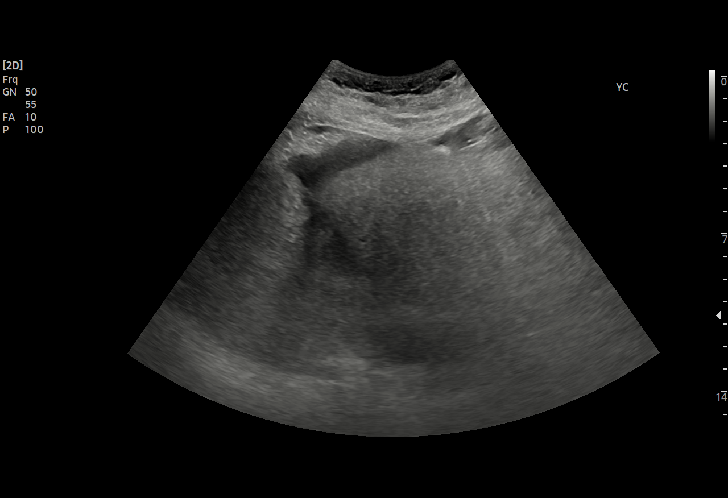
[im 52/66]
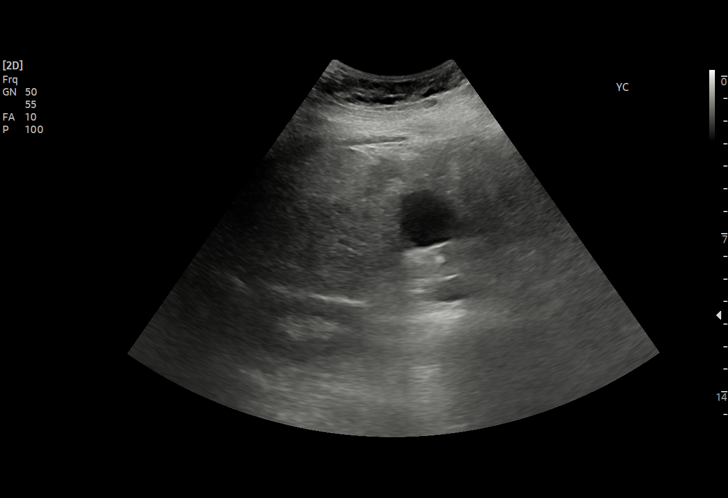
[im 55/66]
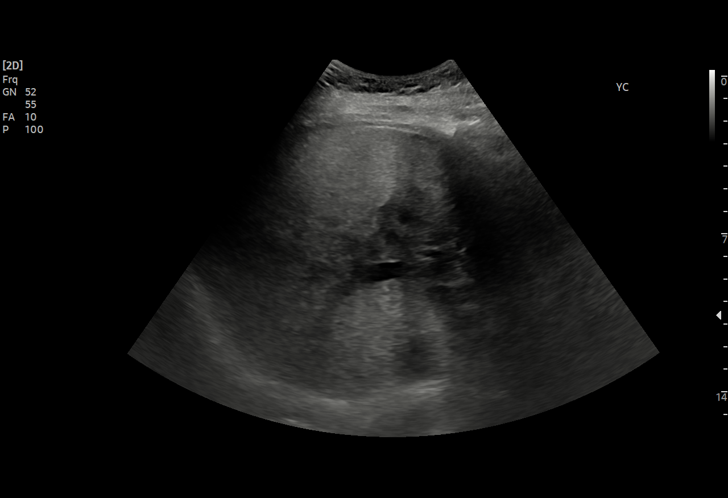
[im 60/66]
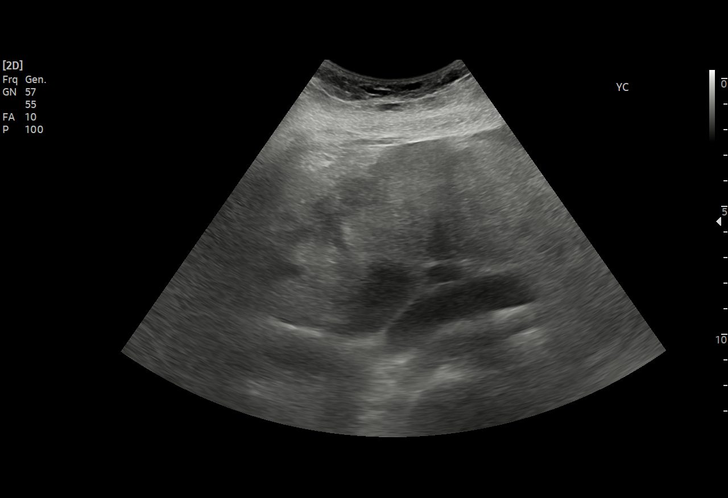
[im 66/66]
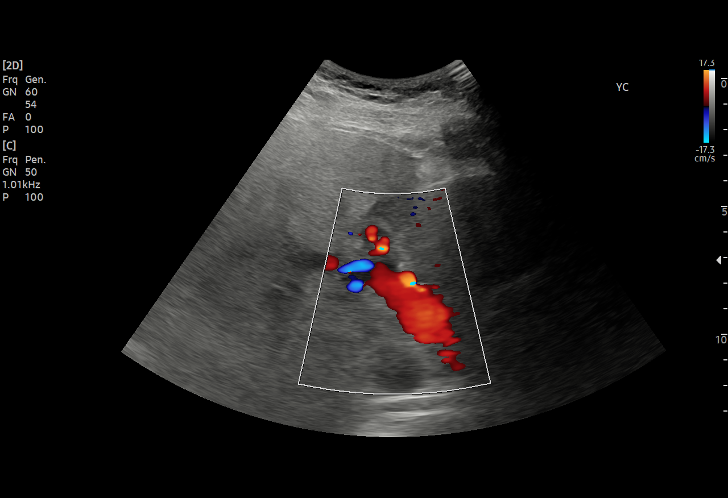

[15 of 25 positions shown; findings below may reference images not displayed]

FINDINGS: Gallbladder:

No gallstones or wall thickening visualized. No sonographic Murphy
sign noted by sonographer.

Common bile duct:

Diameter: 7 mm.

Liver:

Previously identified right hepatic lesion not visualized. No new
focal lesion identified. Increased parenchymal echogenicity. Portal
vein is patent on color Doppler imaging with normal direction of
blood flow towards the liver.

Other: None.
IMPRESSION: 1. Hepatic steatosis. Please note limited evaluation for focal
hepatic masses in a patient with hepatic steatosis due to decreased
penetration of the acoustic ultrasound waves.
2. Previously identified right hepatic lesion not visualized.
